# Patient Record
Sex: Female | Born: 1935 | Race: Black or African American | Hispanic: No | Smoking: Never smoker
Health system: Southern US, Community
[De-identification: ages and names within clinical notes are randomized; demographics above are authoritative.]

## PROBLEM LIST (undated history)

## (undated) DIAGNOSIS — E119 Type 2 diabetes mellitus without complications: Secondary | ICD-10-CM

## (undated) DIAGNOSIS — Z972 Presence of dental prosthetic device (complete) (partial): Secondary | ICD-10-CM

## (undated) DIAGNOSIS — Z9289 Personal history of other medical treatment: Secondary | ICD-10-CM

## (undated) DIAGNOSIS — E785 Hyperlipidemia, unspecified: Secondary | ICD-10-CM

## (undated) DIAGNOSIS — I10 Essential (primary) hypertension: Secondary | ICD-10-CM

## (undated) DIAGNOSIS — Z9989 Dependence on other enabling machines and devices: Secondary | ICD-10-CM

## (undated) DIAGNOSIS — M1990 Unspecified osteoarthritis, unspecified site: Secondary | ICD-10-CM

## (undated) DIAGNOSIS — C55 Malignant neoplasm of uterus, part unspecified: Secondary | ICD-10-CM

## (undated) DIAGNOSIS — I2510 Atherosclerotic heart disease of native coronary artery without angina pectoris: Secondary | ICD-10-CM

## (undated) DIAGNOSIS — R0600 Dyspnea, unspecified: Secondary | ICD-10-CM

## (undated) DIAGNOSIS — D649 Anemia, unspecified: Secondary | ICD-10-CM

## (undated) HISTORY — PX: EYE SURGERY: SHX253

## (undated) HISTORY — DX: Malignant neoplasm of uterus, part unspecified: C55

## (undated) HISTORY — PX: COLONOSCOPY: SHX174

## (undated) HISTORY — PX: MULTIPLE TOOTH EXTRACTIONS: SHX2053

---

## 2008-03-01 ENCOUNTER — Ambulatory Visit: Payer: Self-pay | Admitting: Internal Medicine

## 2008-03-20 ENCOUNTER — Ambulatory Visit: Payer: Self-pay | Admitting: Internal Medicine

## 2008-06-07 ENCOUNTER — Encounter: Admission: RE | Admit: 2008-06-07 | Discharge: 2008-06-07 | Payer: Self-pay | Admitting: Internal Medicine

## 2009-06-08 ENCOUNTER — Encounter: Admission: RE | Admit: 2009-06-08 | Discharge: 2009-06-08 | Payer: Self-pay | Admitting: Internal Medicine

## 2010-06-10 ENCOUNTER — Encounter: Admission: RE | Admit: 2010-06-10 | Discharge: 2010-06-10 | Payer: Self-pay | Admitting: Internal Medicine

## 2011-04-27 ENCOUNTER — Inpatient Hospital Stay (INDEPENDENT_AMBULATORY_CARE_PROVIDER_SITE_OTHER)
Admission: RE | Admit: 2011-04-27 | Discharge: 2011-04-27 | Disposition: A | Discharge status: Home / Self Care | Payer: PRIVATE HEALTH INSURANCE | Source: Ambulatory Visit | Attending: Emergency Medicine | Admitting: Emergency Medicine

## 2011-04-27 ENCOUNTER — Ambulatory Visit (INDEPENDENT_AMBULATORY_CARE_PROVIDER_SITE_OTHER): Payer: PRIVATE HEALTH INSURANCE

## 2011-04-27 DIAGNOSIS — M19029 Primary osteoarthritis, unspecified elbow: Secondary | ICD-10-CM

## 2011-04-27 DIAGNOSIS — R070 Pain in throat: Secondary | ICD-10-CM

## 2011-04-27 DIAGNOSIS — J3489 Other specified disorders of nose and nasal sinuses: Secondary | ICD-10-CM

## 2011-07-14 ENCOUNTER — Other Ambulatory Visit: Payer: Self-pay | Admitting: Internal Medicine

## 2011-07-14 DIAGNOSIS — Z1231 Encounter for screening mammogram for malignant neoplasm of breast: Secondary | ICD-10-CM

## 2011-08-05 ENCOUNTER — Ambulatory Visit
Admission: RE | Admit: 2011-08-05 | Discharge: 2011-08-05 | Disposition: A | Discharge status: Home / Self Care | Payer: PRIVATE HEALTH INSURANCE | Source: Ambulatory Visit | Attending: Internal Medicine | Admitting: Internal Medicine

## 2011-08-05 DIAGNOSIS — Z1231 Encounter for screening mammogram for malignant neoplasm of breast: Secondary | ICD-10-CM

## 2012-07-06 ENCOUNTER — Other Ambulatory Visit: Payer: Self-pay | Admitting: Internal Medicine

## 2012-07-06 DIAGNOSIS — Z1231 Encounter for screening mammogram for malignant neoplasm of breast: Secondary | ICD-10-CM

## 2012-08-05 ENCOUNTER — Ambulatory Visit
Admission: RE | Admit: 2012-08-05 | Discharge: 2012-08-05 | Disposition: A | Discharge status: Home / Self Care | Payer: PRIVATE HEALTH INSURANCE | Source: Ambulatory Visit | Attending: Internal Medicine | Admitting: Internal Medicine

## 2012-08-05 DIAGNOSIS — Z1231 Encounter for screening mammogram for malignant neoplasm of breast: Secondary | ICD-10-CM

## 2012-10-11 ENCOUNTER — Other Ambulatory Visit: Payer: Self-pay | Admitting: Internal Medicine

## 2012-10-11 DIAGNOSIS — Z780 Asymptomatic menopausal state: Secondary | ICD-10-CM

## 2013-06-29 ENCOUNTER — Other Ambulatory Visit: Payer: Self-pay

## 2013-06-29 DIAGNOSIS — Z1231 Encounter for screening mammogram for malignant neoplasm of breast: Secondary | ICD-10-CM

## 2013-08-08 ENCOUNTER — Ambulatory Visit
Admission: RE | Admit: 2013-08-08 | Discharge: 2013-08-08 | Disposition: A | Discharge status: Home / Self Care | Payer: Medicare Other | Source: Ambulatory Visit

## 2013-08-08 DIAGNOSIS — Z1231 Encounter for screening mammogram for malignant neoplasm of breast: Secondary | ICD-10-CM

## 2013-08-30 ENCOUNTER — Ambulatory Visit
Admission: RE | Admit: 2013-08-30 | Discharge: 2013-08-30 | Disposition: A | Discharge status: Home / Self Care | Payer: Medicare Other | Source: Ambulatory Visit | Attending: Internal Medicine | Admitting: Internal Medicine

## 2013-08-30 DIAGNOSIS — Z780 Asymptomatic menopausal state: Secondary | ICD-10-CM

## 2014-07-05 ENCOUNTER — Other Ambulatory Visit: Payer: Self-pay

## 2014-07-05 DIAGNOSIS — Z1231 Encounter for screening mammogram for malignant neoplasm of breast: Secondary | ICD-10-CM

## 2014-08-09 ENCOUNTER — Ambulatory Visit
Admission: RE | Admit: 2014-08-09 | Discharge: 2014-08-09 | Disposition: A | Discharge status: Home / Self Care | Payer: Commercial Managed Care - HMO | Source: Ambulatory Visit

## 2014-08-09 DIAGNOSIS — Z1231 Encounter for screening mammogram for malignant neoplasm of breast: Secondary | ICD-10-CM

## 2015-07-11 ENCOUNTER — Other Ambulatory Visit: Payer: Self-pay

## 2015-07-11 DIAGNOSIS — Z1231 Encounter for screening mammogram for malignant neoplasm of breast: Secondary | ICD-10-CM

## 2015-08-13 ENCOUNTER — Ambulatory Visit
Admission: RE | Admit: 2015-08-13 | Discharge: 2015-08-13 | Disposition: A | Discharge status: Home / Self Care | Payer: Commercial Managed Care - HMO | Source: Ambulatory Visit

## 2015-08-13 DIAGNOSIS — Z1231 Encounter for screening mammogram for malignant neoplasm of breast: Secondary | ICD-10-CM

## 2015-11-27 ENCOUNTER — Other Ambulatory Visit: Payer: Self-pay | Admitting: Internal Medicine

## 2015-11-27 DIAGNOSIS — E2839 Other primary ovarian failure: Secondary | ICD-10-CM

## 2015-12-12 ENCOUNTER — Encounter: Payer: Self-pay | Admitting: Internal Medicine

## 2015-12-19 ENCOUNTER — Ambulatory Visit
Admission: RE | Admit: 2015-12-19 | Discharge: 2015-12-19 | Disposition: A | Discharge status: Home / Self Care | Payer: Commercial Managed Care - HMO | Source: Ambulatory Visit | Attending: Internal Medicine | Admitting: Internal Medicine

## 2015-12-19 DIAGNOSIS — E2839 Other primary ovarian failure: Secondary | ICD-10-CM

## 2016-07-10 ENCOUNTER — Other Ambulatory Visit: Payer: Self-pay | Admitting: Internal Medicine

## 2016-07-10 DIAGNOSIS — Z1231 Encounter for screening mammogram for malignant neoplasm of breast: Secondary | ICD-10-CM

## 2016-08-13 ENCOUNTER — Ambulatory Visit: Payer: Commercial Managed Care - HMO

## 2016-08-25 ENCOUNTER — Ambulatory Visit
Admission: RE | Admit: 2016-08-25 | Discharge: 2016-08-25 | Disposition: A | Discharge status: Home / Self Care | Payer: Medicare HMO | Source: Ambulatory Visit | Attending: Internal Medicine | Admitting: Internal Medicine

## 2016-08-25 DIAGNOSIS — Z1231 Encounter for screening mammogram for malignant neoplasm of breast: Secondary | ICD-10-CM

## 2018-01-11 ENCOUNTER — Encounter (HOSPITAL_COMMUNITY): Payer: Self-pay | Admitting: *Deleted

## 2018-01-11 ENCOUNTER — Other Ambulatory Visit: Payer: Self-pay

## 2018-01-11 ENCOUNTER — Observation Stay (HOSPITAL_COMMUNITY)
Admission: EM | Admit: 2018-01-11 | Discharge: 2018-01-13 | Disposition: A | Discharge status: Home / Self Care | Payer: Medicare HMO | Attending: Internal Medicine | Admitting: Internal Medicine

## 2018-01-11 ENCOUNTER — Emergency Department (HOSPITAL_COMMUNITY): Payer: Medicare HMO

## 2018-01-11 DIAGNOSIS — I509 Heart failure, unspecified: Secondary | ICD-10-CM | POA: Insufficient documentation

## 2018-01-11 DIAGNOSIS — E785 Hyperlipidemia, unspecified: Secondary | ICD-10-CM | POA: Insufficient documentation

## 2018-01-11 DIAGNOSIS — I110 Hypertensive heart disease with heart failure: Secondary | ICD-10-CM | POA: Diagnosis not present

## 2018-01-11 DIAGNOSIS — Z79899 Other long term (current) drug therapy: Secondary | ICD-10-CM | POA: Diagnosis not present

## 2018-01-11 DIAGNOSIS — I2510 Atherosclerotic heart disease of native coronary artery without angina pectoris: Secondary | ICD-10-CM | POA: Insufficient documentation

## 2018-01-11 DIAGNOSIS — D649 Anemia, unspecified: Secondary | ICD-10-CM | POA: Diagnosis present

## 2018-01-11 DIAGNOSIS — N9489 Other specified conditions associated with female genital organs and menstrual cycle: Secondary | ICD-10-CM

## 2018-01-11 DIAGNOSIS — R3129 Other microscopic hematuria: Secondary | ICD-10-CM | POA: Diagnosis not present

## 2018-01-11 DIAGNOSIS — D61818 Other pancytopenia: Secondary | ICD-10-CM | POA: Diagnosis present

## 2018-01-11 DIAGNOSIS — E119 Type 2 diabetes mellitus without complications: Secondary | ICD-10-CM | POA: Insufficient documentation

## 2018-01-11 DIAGNOSIS — D500 Iron deficiency anemia secondary to blood loss (chronic): Secondary | ICD-10-CM | POA: Diagnosis not present

## 2018-01-11 DIAGNOSIS — D4959 Neoplasm of unspecified behavior of other genitourinary organ: Secondary | ICD-10-CM | POA: Diagnosis not present

## 2018-01-11 DIAGNOSIS — I10 Essential (primary) hypertension: Secondary | ICD-10-CM | POA: Diagnosis present

## 2018-01-11 DIAGNOSIS — N3289 Other specified disorders of bladder: Secondary | ICD-10-CM

## 2018-01-11 DIAGNOSIS — N329 Bladder disorder, unspecified: Secondary | ICD-10-CM | POA: Diagnosis not present

## 2018-01-11 DIAGNOSIS — Z880 Allergy status to penicillin: Secondary | ICD-10-CM | POA: Insufficient documentation

## 2018-01-11 DIAGNOSIS — R600 Localized edema: Secondary | ICD-10-CM | POA: Insufficient documentation

## 2018-01-11 DIAGNOSIS — R319 Hematuria, unspecified: Secondary | ICD-10-CM

## 2018-01-11 HISTORY — DX: Hyperlipidemia, unspecified: E78.5

## 2018-01-11 HISTORY — DX: Atherosclerotic heart disease of native coronary artery without angina pectoris: I25.10

## 2018-01-11 HISTORY — DX: Essential (primary) hypertension: I10

## 2018-01-11 HISTORY — DX: Type 2 diabetes mellitus without complications: E11.9

## 2018-01-11 LAB — URINALYSIS, ROUTINE W REFLEX MICROSCOPIC
Bilirubin Urine: NEGATIVE
Glucose, UA: NEGATIVE mg/dL
Ketones, ur: NEGATIVE mg/dL
Leukocytes, UA: NEGATIVE
Nitrite: NEGATIVE
Protein, ur: NEGATIVE mg/dL
RBC / HPF: >50 RBC/hpf — ABNORMAL HIGH (ref 0–5)
Specific Gravity, Urine: 1.005 (ref 1.005–1.030)
pH: 7 (ref 5.0–8.0)

## 2018-01-11 LAB — IRON AND TIBC
Iron: 143 ug/dL (ref 28–170)
Saturation Ratios: 29 % (ref 10.4–31.8)
TIBC: 491 ug/dL — ABNORMAL HIGH (ref 250–450)
UIBC: 348 ug/dL

## 2018-01-11 LAB — CBC WITH DIFFERENTIAL/PLATELET
Abs Immature Granulocytes: 0 10*3/uL (ref 0.0–0.1)
Basophils Absolute: 0 10*3/uL (ref 0.0–0.1)
Basophils Relative: 0 %
Eosinophils Absolute: 0.1 10*3/uL (ref 0.0–0.7)
Eosinophils Relative: 1 %
HCT: 24.7 % — ABNORMAL LOW (ref 36.0–46.0)
Hemoglobin: 6.6 g/dL — CL (ref 12.0–15.0)
Immature Granulocytes: 0 %
Lymphocytes Relative: 28 %
Lymphs Abs: 1.3 10*3/uL (ref 0.7–4.0)
MCH: 18.8 pg — ABNORMAL LOW (ref 26.0–34.0)
MCHC: 26.7 g/dL — ABNORMAL LOW (ref 30.0–36.0)
MCV: 70.2 fL — ABNORMAL LOW (ref 78.0–100.0)
Monocytes Absolute: 0.5 10*3/uL (ref 0.1–1.0)
Monocytes Relative: 10 %
Neutro Abs: 2.8 10*3/uL (ref 1.7–7.7)
Neutrophils Relative %: 61 %
Platelets: 279 10*3/uL (ref 150–400)
RBC: 3.52 MIL/uL — ABNORMAL LOW (ref 3.87–5.11)
RDW: 19 % — ABNORMAL HIGH (ref 11.5–15.5)
WBC: 4.6 10*3/uL (ref 4.0–10.5)

## 2018-01-11 LAB — COMPREHENSIVE METABOLIC PANEL
ALT: 11 U/L — ABNORMAL LOW (ref 14–54)
AST: 21 U/L (ref 15–41)
Albumin: 4.2 g/dL (ref 3.5–5.0)
Alkaline Phosphatase: 69 U/L (ref 38–126)
Anion gap: 9 (ref 5–15)
BUN: 13 mg/dL (ref 6–20)
CO2: 28 mmol/L (ref 22–32)
Calcium: 9.7 mg/dL (ref 8.9–10.3)
Chloride: 101 mmol/L (ref 101–111)
Creatinine, Ser: 0.9 mg/dL (ref 0.44–1.00)
GFR calc Af Amer: >60 mL/min (ref >60)
GFR calc non Af Amer: 58 mL/min — ABNORMAL LOW (ref >60)
Glucose, Bld: 112 mg/dL — ABNORMAL HIGH (ref 65–99)
Potassium: 4.4 mmol/L (ref 3.5–5.1)
Sodium: 138 mmol/L (ref 135–145)
Total Bilirubin: 0.4 mg/dL (ref 0.3–1.2)
Total Protein: 7 g/dL (ref 6.5–8.1)

## 2018-01-11 LAB — RETICULOCYTES
RBC.: 3.64 MIL/uL — ABNORMAL LOW (ref 3.87–5.11)
Retic Count, Absolute: 80.1 10*3/uL (ref 19.0–186.0)
Retic Ct Pct: 2.2 % (ref 0.4–3.1)

## 2018-01-11 LAB — PREPARE RBC (CROSSMATCH)

## 2018-01-11 LAB — FERRITIN: Ferritin: 11 ng/mL (ref 11–307)

## 2018-01-11 LAB — VITAMIN B12: Vitamin B-12: 620 pg/mL (ref 180–914)

## 2018-01-11 LAB — PROTIME-INR
INR: 0.98
Prothrombin Time: 12.9 seconds (ref 11.4–15.2)

## 2018-01-11 LAB — POC OCCULT BLOOD, ED: Fecal Occult Bld: NEGATIVE

## 2018-01-11 LAB — ABO/RH: ABO/RH(D): O POS

## 2018-01-11 MED ORDER — SODIUM CHLORIDE 0.9% FLUSH
3.0000 mL | Freq: Two times a day (BID) | INTRAVENOUS | Status: DC
  Administered 2018-01-11 – 2018-01-13 (×4): 3 mL via INTRAVENOUS

## 2018-01-11 MED ORDER — CYCLOSPORINE 0.05 % OP EMUL
1.0000 [drp] | Freq: Two times a day (BID) | OPHTHALMIC | Status: DC
  Administered 2018-01-12 – 2018-01-13 (×4): 1 [drp] via OPHTHALMIC
  Filled 2018-01-11 (×5): qty 1

## 2018-01-11 MED ORDER — ENALAPRIL MALEATE 20 MG PO TABS
20.0000 mg | ORAL_TABLET | Freq: Two times a day (BID) | ORAL | Status: DC
  Administered 2018-01-12 – 2018-01-13 (×4): 20 mg via ORAL
  Filled 2018-01-11 (×6): qty 1

## 2018-01-11 MED ORDER — ISOSORBIDE MONONITRATE 20 MG PO TABS
20.0000 mg | ORAL_TABLET | Freq: Every day | ORAL | Status: DC
  Administered 2018-01-12 – 2018-01-13 (×2): 20 mg via ORAL
  Filled 2018-01-11 (×2): qty 1

## 2018-01-11 MED ORDER — ACETAMINOPHEN 650 MG RE SUPP: 650.0000 mg | Freq: Four times a day (QID) | RECTAL | Status: DC | PRN

## 2018-01-11 MED ORDER — HYDRALAZINE HCL 20 MG/ML IJ SOLN
10.0000 mg | INTRAMUSCULAR | Status: DC | PRN
  Administered 2018-01-11: 10 mg via INTRAVENOUS
  Filled 2018-01-11: qty 1

## 2018-01-11 MED ORDER — ACETAMINOPHEN 325 MG PO TABS: 650.0000 mg | ORAL_TABLET | Freq: Four times a day (QID) | ORAL | Status: DC | PRN

## 2018-01-11 MED ORDER — ONDANSETRON HCL 4 MG/2ML IJ SOLN: 4.0000 mg | Freq: Four times a day (QID) | INTRAMUSCULAR | Status: DC | PRN

## 2018-01-11 MED ORDER — AMLODIPINE BESYLATE 5 MG PO TABS
5.0000 mg | ORAL_TABLET | Freq: Every day | ORAL | Status: DC
  Administered 2018-01-12 – 2018-01-13 (×2): 5 mg via ORAL
  Filled 2018-01-11 (×2): qty 1

## 2018-01-11 MED ORDER — SODIUM CHLORIDE 0.9% FLUSH: 3.0000 mL | INTRAVENOUS | Status: DC | PRN

## 2018-01-11 MED ORDER — CLONIDINE HCL 0.2 MG PO TABS
0.2000 mg | ORAL_TABLET | Freq: Two times a day (BID) | ORAL | Status: DC
  Administered 2018-01-11 – 2018-01-13 (×4): 0.2 mg via ORAL
  Filled 2018-01-11 (×4): qty 1

## 2018-01-11 MED ORDER — HYDROCODONE-ACETAMINOPHEN 5-325 MG PO TABS: 1.0000 | ORAL_TABLET | ORAL | Status: DC | PRN

## 2018-01-11 MED ORDER — ONDANSETRON HCL 4 MG PO TABS: 4.0000 mg | ORAL_TABLET | Freq: Four times a day (QID) | ORAL | Status: DC | PRN

## 2018-01-11 MED ORDER — SODIUM CHLORIDE 0.9 % IV SOLN: 250.0000 mL | INTRAVENOUS | Status: DC | PRN

## 2018-01-11 MED ORDER — METOPROLOL SUCCINATE ER 50 MG PO TB24
50.0000 mg | ORAL_TABLET | Freq: Every day | ORAL | Status: DC
  Administered 2018-01-12 – 2018-01-13 (×2): 50 mg via ORAL
  Filled 2018-01-11 (×2): qty 1

## 2018-01-11 MED ORDER — SIMVASTATIN 20 MG PO TABS
20.0000 mg | ORAL_TABLET | Freq: Every day | ORAL | Status: DC
  Filled 2018-01-11: qty 1

## 2018-01-11 MED ORDER — SODIUM CHLORIDE 0.9 % IV SOLN
Freq: Once | INTRAVENOUS | Status: AC
  Administered 2018-01-13: 12:00:00 via INTRAVENOUS

## 2018-01-11 MED ORDER — SIMVASTATIN 20 MG PO TABS
20.0000 mg | ORAL_TABLET | Freq: Every day | ORAL | Status: DC
  Administered 2018-01-11 – 2018-01-13 (×3): 20 mg via ORAL
  Filled 2018-01-11 (×2): qty 1

## 2018-01-11 MED ORDER — FUROSEMIDE 40 MG PO TABS
40.0000 mg | ORAL_TABLET | Freq: Every day | ORAL | Status: DC
  Administered 2018-01-13: 40 mg via ORAL
  Filled 2018-01-11 (×2): qty 1

## 2018-01-11 NOTE — ED Provider Notes (Signed)
Patient placed in Quick Look pathway, seen and evaluated   Chief Complaint: shortness of breah  HPI:   Pt sent here for low hemoglobin.   ROS: weak and short of breath  Physical Exam:   Gen: No distress  Neuro: Awake and Alert  Skin: Warm    Focused Exam: pale, Lungs clear heart rrr   Initiation of care has begun. The patient has been counseled on the process, plan, and necessity for staying for the completion/evaluation, and the remainder of the medical screening examination   Sidney Ace 01/11/18 1806    Valarie Merino, MD 01/11/18 2351

## 2018-01-11 NOTE — ED Triage Notes (Signed)
Pt in with family stating she went to her PCP last week and had lab work completed- went in due to increased shortness of breath over the last few months- pt called today and told her hemoglobin was low, no distress on arrival

## 2018-01-11 NOTE — ED Provider Notes (Signed)
[facility] EMERGENCY DEPARTMENT Provider Note   CSN: [id number] Arrival date & time: 01/11/18  1709     History   Chief Complaint Chief Complaint  Patient presents with  . Abnormal Lab    HPI Brenda Alexander is a 84 y.o. female.  84 year old female with prior history of diabetes, hypertension, and hyperlipidemia presents for evaluation of anemia.  Patient reports gradually worsening weakness and dyspnea with exertion.  The symptoms have been progressing over the last 1 to 2 weeks.  She was seen by her primary doctor today and diagnosed with anemia and then sent to the ED for further evaluation.  Patient denies associated chest pain.  Patient denies a prior history of anemia or blood transfusion.  Patient denies any recent obvious lower GI bleed.  The history is provided by the patient and a relative.  Abnormal Lab  Time since result:  Anemia Patient referred by:  PCP Resulting agency:  External Resulting agency details:  Low hgb    Past Medical History:  Diagnosis Date  . Coronary artery disease   . Diabetes mellitus without complication ([place])   . Hyperlipemia   . Hypertension     Patient Active Problem List   Diagnosis Date Noted  . Symptomatic anemia 01/11/2018  . Hypertension 01/11/2018  . Coronary artery disease 01/11/2018  . Bilateral lower extremity edema 01/11/2018    History reviewed. No pertinent surgical history.   OB History   None      Home Medications    Prior to Admission medications   Medication Sig Start Date End Date Taking? Authorizing Provider  acetaminophen (TYLENOL) 500 MG tablet Take 500-1,000 mg by mouth every 6 (six) hours as needed (for pain).   Yes [provider]  amLODipine (NORVASC) 5 MG tablet Take 5 mg by mouth daily.   Yes [provider]  Calcium Carb-Cholecalciferol (CALCIUM + D3 PO) Take 1 tablet by mouth daily.   Yes [provider]  cloNIDine (CATAPRES) 0.2 MG tablet Take 0.2 mg  by mouth 2 (two) times daily.   Yes [provider]  cycloSPORINE (RESTASIS) 0.05 % ophthalmic emulsion Place 1 drop into both eyes 2 (two) times daily.   Yes [provider]  enalapril (VASOTEC) 20 MG tablet Take 20 mg by mouth 2 (two) times daily. 12/23/17  Yes [provider]  furosemide (LASIX) 40 MG tablet Take 40 mg by mouth daily.   Yes [provider]  isosorbide mononitrate (ISMO,MONOKET) 20 MG tablet Take 20 mg by mouth daily. 12/23/17  Yes [provider]  metoprolol succinate (TOPROL-XL) 50 MG 24 hr tablet Take 50 mg by mouth daily. 12/23/17  Yes [provider]  nitrofurantoin, macrocrystal-monohydrate, (MACROBID) 100 MG capsule Take 100 mg by mouth 2 (two) times daily. FOR 7 DAYS 01/07/18 01/13/18 Yes [provider]  potassium chloride SA (K-DUR,KLOR-CON) 20 MEQ tablet Take 20 mEq by mouth daily.   Yes [provider]  simvastatin (ZOCOR) 20 MG tablet Take 20 mg by mouth at bedtime.    Yes [provider]    Family History History reviewed. No pertinent family history.  Social History Social History   Tobacco Use  . Smoking status: Never Smoker  . Smokeless tobacco: Never Used  Substance Use Topics  . Alcohol use: Not on file  . Drug use: Not on file     Allergies   Other and Penicillins   Review of Systems Review of Systems  Constitutional: Positive for  fatigue.  All other systems reviewed and are negative.    Physical Exam Updated Vital Signs BP (!) 185/86   Pulse 71   Temp 98.2 F (36.8 C) (Oral)   Resp 13   SpO2 99%   Physical Exam  Constitutional: She is oriented to person, place, and time. She appears well-developed and well-nourished. No distress.  HENT:  Head: Normocephalic and atraumatic.  Mouth/Throat: Oropharynx is clear and moist.  Eyes: Pupils are equal, round, and reactive to light. Conjunctivae and EOM are normal.  Neck: Normal range of motion. Neck supple.    Cardiovascular: Normal rate, regular rhythm and normal heart sounds.  Pulmonary/Chest: Effort normal and breath sounds normal. No respiratory distress.  Abdominal: Soft. She exhibits no distension. There is no tenderness.  Musculoskeletal: Normal range of motion. She exhibits no edema or deformity.  Neurological: She is alert and oriented to person, place, and time.  Skin: Skin is warm and dry.  Psychiatric: She has a normal mood and affect.  Nursing note and vitals reviewed.    ED Treatments / Results  Labs (all labs ordered are listed, but only abnormal results are displayed) Labs Reviewed  CBC WITH DIFFERENTIAL/PLATELET - Abnormal; Notable for the following components:      Result Value   RBC 3.52 (*)    Hemoglobin 6.6 (*)    HCT 24.7 (*)    MCV 70.2 (*)    MCH 18.8 (*)    MCHC 26.7 (*)    RDW 19.0 (*)    All other components within normal limits  COMPREHENSIVE METABOLIC PANEL - Abnormal; Notable for the following components:   Glucose, Bld 112 (*)    ALT 11 (*)    GFR calc non Af Amer 58 (*)    All other components within normal limits  URINALYSIS, ROUTINE W REFLEX MICROSCOPIC - Abnormal; Notable for the following components:   Hgb urine dipstick LARGE (*)    RBC / HPF >50 (*)    Bacteria, UA RARE (*)    All other components within normal limits  VITAMIN B12  FOLATE  IRON AND TIBC  FERRITIN  RETICULOCYTES  PROTIME-INR  BASIC METABOLIC PANEL  POC OCCULT BLOOD, ED  TYPE AND SCREEN  ABO/RH  PREPARE RBC (CROSSMATCH)    EKG EKG Interpretation  Date/Time:  Monday January 11 2018 19:32:51 EDT Ventricular Rate:  68 PR Interval:    QRS Duration: 86 QT Interval:  433 QTC Calculation: 461 R Axis:   7 Text Interpretation:  Sinus rhythm Abnormal R-wave progression, early transition Confirmed by Dene Gentry [phone]) on 01/11/2018 7:35:31 PM   Radiology Dg Chest Port 1 View  Result Date: 01/11/2018 CLINICAL DATA:  84 year old female with increasing shortness of  breath over the last few months. Weakness. Anemic. EXAM: PORTABLE CHEST 1 VIEW COMPARISON:  None. FINDINGS: Portable AP semi upright view at 1928 hours. Mild cardiomegaly. Other mediastinal contours are within normal limits. Visualized tracheal air column is within normal limits. Allowing for portable technique the lungs are clear. Negative visible bowel gas pattern. No acute osseous abnormality identified. IMPRESSION: Mild cardiomegaly.  No acute cardiopulmonary abnormality. Electronically Signed   By: Genevie Ann M.D.   On: 01/11/2018 19:43    Procedures Procedures (including critical care time)  Medications Ordered in ED Medications - No data to display   Initial Impression / Assessment and Plan / ED Course  I have reviewed the triage vital signs and the nursing notes.  Pertinent labs & imaging results that  were available during my care of the patient were reviewed by me and considered in my medical decision making (see chart for details).     MDM  Screen complete  Patient is presenting for evaluation of reported anemia.  Patient appears to be symptomatic based on complaint.  Initial labs show a hemoglobin of 6.6.  No evidence of active GI bleed at this time.  Other screening labs do not suggest other acute pathology.  Will be admitted to the hospitalist service for further work-up and treatment. Opyd aware of case.    Final Clinical Impressions(s) / ED Diagnoses   Final diagnoses:  Symptomatic anemia    ED Discharge Orders    None       Valarie Merino, MD 01/11/18 2144

## 2018-01-11 NOTE — ED Notes (Signed)
Attempted report.  Left number 

## 2018-01-11 NOTE — H&P (Signed)
History and Physical    Nalani Andreen OAC:[id number] DOB: 07/04/1933 DOA: 01/11/2018  PCP: Nolene Ebbs, MD   Patient coming from: Home  Chief Complaint: DOE, low Hgb with PCP  HPI: Legend Pecore is a 84 y.o. female with medical history significant for hypertension and coronary artery disease, now presenting to the emergency department at the direction of her PCP for evaluation of exertional dyspnea and low hemoglobin.  Patient reports that she had been in her usual state of health until noting the insidious development of progressive exertional dyspnea over the past few weeks.  She denies any chest pain, palpitations, cough, fevers, or chills associated with this.  She denies seeing blood in her stool, but reports occasional dark stool.  She denies epigastric or abdominal discomfort and denies any significant nausea.  She does not take a blood thinner and stopped her baby aspirin a couple weeks ago.  She was evaluated by her PCP for these complaints, blood work was obtained, hemoglobin was noted to be low, and she was directed to the ED for further evaluation.  ED Course: Upon arrival to the ED, patient is found to be afebrile, saturating well on room air, mildly hypertensive, and vitals otherwise normal.  EKG features a sinus rhythm with early R transition and chest x-ray is negative for acute cardiopulmonary disease.  Chemistry panel is unremarkable and CBC is notable for microcytic anemia with hemoglobin of 6.6 and MCV of 70.2.  Fecal occult blood testing is negative.  Urinalysis features microscopic hematuria.  Type and screen was performed in the ED.  Patient remains hemodynamically stable, in no apparent respiratory distress, and will be observed for ongoing evaluation and management of symptomatic anemia.  Review of Systems:  All other systems reviewed and apart from HPI, are negative.  Past Medical History:  Diagnosis Date  . Coronary artery disease   . Diabetes mellitus without  complication ([place])   . Hyperlipemia   . Hypertension     History reviewed. No pertinent surgical history.   reports that she has never smoked. She has never used smokeless tobacco. Her alcohol and drug histories are not on file.  Allergies  Allergen Reactions  . Other Anaphylaxis and Swelling    Walnuts = Throat swells  . Penicillins Rash    Has patient had a PCN reaction causing immediate rash, facial/tongue/throat swelling, SOB or lightheadedness with hypotension: Yes Has patient had a PCN reaction causing severe rash involving mucus membranes or skin necrosis: Unk Has patient had a PCN reaction that required hospitalization: Unk Has patient had a PCN reaction occurring within the last 10 years: No If all of the above answers are "NO", then may proceed with Cephalosporin use.     Family History  Problem Relation Age of Onset  . Obesity Son      Prior to Admission medications   Medication Sig Start Date End Date Taking? Authorizing Provider  acetaminophen (TYLENOL) 500 MG tablet Take 500-1,000 mg by mouth every 6 (six) hours as needed (for pain).   Yes [provider]  amLODipine (NORVASC) 5 MG tablet Take 5 mg by mouth daily.   Yes [provider]  Calcium Carb-Cholecalciferol (CALCIUM + D3 PO) Take 1 tablet by mouth daily.   Yes [provider]  cloNIDine (CATAPRES) 0.2 MG tablet Take 0.2 mg by mouth 2 (two) times daily.   Yes [provider]  cycloSPORINE (RESTASIS) 0.05 % ophthalmic emulsion Place 1 drop into both eyes 2 (two) times daily.  Yes [provider]  enalapril (VASOTEC) 20 MG tablet Take 20 mg by mouth 2 (two) times daily. 12/23/17  Yes [provider]  furosemide (LASIX) 40 MG tablet Take 40 mg by mouth daily.   Yes [provider]  isosorbide mononitrate (ISMO,MONOKET) 20 MG tablet Take 20 mg by mouth daily. 12/23/17  Yes [provider]  metoprolol succinate (TOPROL-XL) 50 MG 24 hr tablet  Take 50 mg by mouth daily. 12/23/17  Yes [provider]  nitrofurantoin, macrocrystal-monohydrate, (MACROBID) 100 MG capsule Take 100 mg by mouth 2 (two) times daily. FOR 7 DAYS 01/07/18 01/13/18 Yes [provider]  potassium chloride SA (K-DUR,KLOR-CON) 20 MEQ tablet Take 20 mEq by mouth daily.   Yes [provider]  simvastatin (ZOCOR) 20 MG tablet Take 20 mg by mouth at bedtime.    Yes [provider]    Physical Exam: Vitals:   01/11/18 2030 01/11/18 2100 01/11/18 2109 01/11/18 2130  BP: (!) 165/81 (!) 185/86 (!) 185/86 (!) 165/78  Pulse: 67 80 71 74  Resp: 18 (!) 25 13 13   Temp:      TempSrc:      SpO2: 100% 100% 99% 99%      Constitutional: NAD, calm  Eyes: PERTLA, lids and conjunctivae normal ENMT: Mucous membranes are moist. Posterior pharynx clear of any exudate or lesions.   Neck: normal, supple, no masses, no thyromegaly Respiratory: clear to auscultation bilaterally, no wheezing, no crackles. Normal respiratory effort.   Cardiovascular: S1 & S2 heard, regular rate and rhythm. Trace pretibial edema bilaterally. No significant JVD. Abdomen: No distension, no tenderness, soft. Bowel sounds active.  Musculoskeletal: no clubbing / cyanosis. No joint deformity upper and lower extremities.    Skin: no significant rashes, lesions, ulcers. Warm, dry, well-perfused. Psychiatric: Alert and oriented to person, place, and situation. Pleasant and cooperative.     Labs on Admission: I have personally reviewed following labs and imaging studies  CBC: Recent Labs  Lab 01/11/18 1801  WBC 4.6  NEUTROABS 2.8  HGB 6.6*  HCT 24.7*  MCV 70.2*  PLT 967   Basic Metabolic Panel: Recent Labs  Lab 01/11/18 1801  NA 138  K 4.4  CL 101  CO2 28  GLUCOSE 112*  BUN 13  CREATININE 0.90  CALCIUM 9.7   GFR: CrCl cannot be calculated (Unknown ideal weight.). Liver Function Tests: Recent Labs  Lab 01/11/18 1801  AST 21  ALT 11*  ALKPHOS 69    BILITOT 0.4  PROT 7.0  ALBUMIN 4.2   No results for input(s): LIPASE, AMYLASE in the last 168 hours. No results for input(s): AMMONIA in the last 168 hours. Coagulation Profile: No results for input(s): INR, PROTIME in the last 168 hours. Cardiac Enzymes: No results for input(s): CKTOTAL, CKMB, CKMBINDEX, TROPONINI in the last 168 hours. BNP (last 3 results) No results for input(s): PROBNP in the last 8760 hours. HbA1C: No results for input(s): HGBA1C in the last 72 hours. CBG: No results for input(s): GLUCAP in the last 168 hours. Lipid Profile: No results for input(s): CHOL, HDL, LDLCALC, TRIG, CHOLHDL, LDLDIRECT in the last 72 hours. Thyroid Function Tests: No results for input(s): TSH, T4TOTAL, FREET4, T3FREE, THYROIDAB in the last 72 hours. Anemia Panel: No results for input(s): VITAMINB12, FOLATE, FERRITIN, TIBC, IRON, RETICCTPCT in the last 72 hours. Urine analysis:    Component Value Date/Time   COLORURINE YELLOW 01/11/2018 2059   APPEARANCEUR CLEAR 01/11/2018 2059   LABSPEC 1.005 01/11/2018 2059  PHURINE 7.0 01/11/2018 2059   GLUCOSEU NEGATIVE 01/11/2018 2059   HGBUR LARGE (A) 01/11/2018 2059   BILIRUBINUR NEGATIVE 01/11/2018 2059   [place] NEGATIVE 01/11/2018 2059   PROTEINUR NEGATIVE 01/11/2018 2059   NITRITE NEGATIVE 01/11/2018 2059   LEUKOCYTESUR NEGATIVE 01/11/2018 2059   Sepsis Labs: @LABRCNTIP (procalcitonin:4,lacticidven:4) )No results found for this or any previous visit (from the past 240 hour(s)).   Radiological Exams on Admission: Dg Chest Port 1 View  Result Date: 01/11/2018 CLINICAL DATA:  84 year old female with increasing shortness of breath over the last few months. Weakness. Anemic. EXAM: PORTABLE CHEST 1 VIEW COMPARISON:  None. FINDINGS: Portable AP semi upright view at 1928 hours. Mild cardiomegaly. Other mediastinal contours are within normal limits. Visualized tracheal air column is within normal limits. Allowing for portable technique  the lungs are clear. Negative visible bowel gas pattern. No acute osseous abnormality identified. IMPRESSION: Mild cardiomegaly.  No acute cardiopulmonary abnormality. Electronically Signed   By: Genevie Ann M.D.   On: 01/11/2018 19:43    EKG: Independently reviewed. Sinus rhythm, poor R transition.   Assessment/Plan   1. Symptomatic anemia  - Presents with several weeks of DOE, found by PCP to be anemic  - Hgb is 6.6 in ED with no priors for comparison  - FOBT is negative and she is hemodynamically stable   - Check anemia panel, transfuse 1 unit and repeat CBC    2. Hypertension  - BP elevated in ED  - Continue Norvasc, clonidine, enalapril, Toprol, and Lasix   3. CAD - No anginal complaints  - Continue ACE, beta-blocker, nitrates, and statin     DVT prophylaxis: SCD's  Code Status: Full  Family Communication: Sons updated at bedside Consults called: None Admission status: Observation     Vianne Bulls, MD [facility] Pager [phone]  If 7PM-7AM, please contact night-coverage [email or web address] Password [facility]  01/11/2018, 9:52 PM

## 2018-01-12 ENCOUNTER — Other Ambulatory Visit: Payer: Self-pay

## 2018-01-12 ENCOUNTER — Encounter (HOSPITAL_COMMUNITY): Payer: Self-pay | Admitting: *Deleted

## 2018-01-12 ENCOUNTER — Observation Stay (HOSPITAL_COMMUNITY): Payer: Medicare HMO

## 2018-01-12 DIAGNOSIS — R319 Hematuria, unspecified: Secondary | ICD-10-CM

## 2018-01-12 DIAGNOSIS — R0600 Dyspnea, unspecified: Secondary | ICD-10-CM

## 2018-01-12 DIAGNOSIS — D649 Anemia, unspecified: Secondary | ICD-10-CM | POA: Diagnosis not present

## 2018-01-12 DIAGNOSIS — D500 Iron deficiency anemia secondary to blood loss (chronic): Secondary | ICD-10-CM | POA: Diagnosis not present

## 2018-01-12 DIAGNOSIS — Z9289 Personal history of other medical treatment: Secondary | ICD-10-CM

## 2018-01-12 DIAGNOSIS — I10 Essential (primary) hypertension: Secondary | ICD-10-CM | POA: Diagnosis not present

## 2018-01-12 HISTORY — DX: Dyspnea, unspecified: R06.00

## 2018-01-12 HISTORY — DX: Personal history of other medical treatment: Z92.89

## 2018-01-12 LAB — BASIC METABOLIC PANEL
Anion gap: 8 (ref 5–15)
BUN: 10 mg/dL (ref 6–20)
CO2: 27 mmol/L (ref 22–32)
Calcium: 9.4 mg/dL (ref 8.9–10.3)
Chloride: 102 mmol/L (ref 101–111)
Creatinine, Ser: 0.77 mg/dL (ref 0.44–1.00)
GFR calc Af Amer: >60 mL/min (ref >60)
GFR calc non Af Amer: >60 mL/min (ref >60)
Glucose, Bld: 113 mg/dL — ABNORMAL HIGH (ref 65–99)
Potassium: 3.8 mmol/L (ref 3.5–5.1)
Sodium: 137 mmol/L (ref 135–145)

## 2018-01-12 LAB — CBC
HCT: 26 % — ABNORMAL LOW (ref 36.0–46.0)
Hemoglobin: 7.4 g/dL — ABNORMAL LOW (ref 12.0–15.0)
MCH: 19.8 pg — ABNORMAL LOW (ref 26.0–34.0)
MCHC: 28.5 g/dL — ABNORMAL LOW (ref 30.0–36.0)
MCV: 69.7 fL — ABNORMAL LOW (ref 78.0–100.0)
Platelets: 253 10*3/uL (ref 150–400)
RBC: 3.73 MIL/uL — ABNORMAL LOW (ref 3.87–5.11)
RDW: 19.6 % — ABNORMAL HIGH (ref 11.5–15.5)
WBC: 5.2 10*3/uL (ref 4.0–10.5)

## 2018-01-12 LAB — HEMOGLOBIN AND HEMATOCRIT, BLOOD
HCT: 29 % — ABNORMAL LOW (ref 36.0–46.0)
Hemoglobin: 8.3 g/dL — ABNORMAL LOW (ref 12.0–15.0)

## 2018-01-12 LAB — PREPARE RBC (CROSSMATCH)

## 2018-01-12 LAB — FOLATE: Folate: 33 ng/mL (ref >5.9)

## 2018-01-12 MED ORDER — IOPAMIDOL (ISOVUE-370) INJECTION 76%
INTRAVENOUS | Status: AC
  Filled 2018-01-12: qty 100

## 2018-01-12 MED ORDER — IOHEXOL 300 MG/ML  SOLN
100.0000 mL | Freq: Once | INTRAMUSCULAR | Status: AC | PRN
  Administered 2018-01-12: 100 mL via INTRAVENOUS

## 2018-01-12 MED ORDER — SODIUM CHLORIDE 0.9% IV SOLUTION
Freq: Once | INTRAVENOUS | Status: AC
  Administered 2018-01-12: 12:00:00 via INTRAVENOUS

## 2018-01-12 MED ORDER — FERROUS GLUCONATE 324 (38 FE) MG PO TABS
324.0000 mg | ORAL_TABLET | Freq: Three times a day (TID) | ORAL | Status: DC
  Administered 2018-01-12 – 2018-01-13 (×4): 324 mg via ORAL
  Filled 2018-01-12 (×5): qty 1

## 2018-01-12 NOTE — Progress Notes (Signed)
PROGRESS NOTE  Sequoyah Ramone PNT:[id number] DOB: 24-Dec-1932 DOA: 01/11/2018 PCP: Nolene Ebbs, MD   LOS: 0 days   Brief Narrative / Interim history: 84 year old pleasant female with medical history of hypertension, coronary artery disease, who presented to the emergency room sent by her PCP because she was found to have low hemoglobin.  Patient denies any symptoms, however her daughter reports that over the last several weeks patient has been more tired and weak and more short of breath with activities.  Assessment & Plan: Principal Problem:   Symptomatic anemia Active Problems:   Hypertension   Coronary artery disease   Symptomatic microcytic anemia -Patient relatively asymptomatic, suggesting a chronic process.  She tells me that she saw her PCP back in February of this year and had blood work done at that time, and that showed that she was anemic.  She was started on iron supplements then, however she self discontinued but cannot tell me exactly why.  She thinks at one point she had black stools and that may be the reason.  She denies any bleeding, denies any bright red blood per rectum.  She is noticing however over the last week that (since last Thursday) her urine has been pink.   -Anemia panel shows low ferritin and high TIBC, suggesting iron deficiency. Will start iron supplements  Hematuria -She denies any dysuria, denies any burning with urination, she has no lower abdominal discomfort, has no frequency. Unlikely UTI but will send a culture.  Patient and her daughter deny urinary tract infections in the past.  -Discussed with urology Dr. Louis Meckel over the phone, he recommended to a CT of the abdomen and pelvis /  hematuria protocol. CT pending.   Hypertension -Blood pressure controlled, continue home Norvasc, furosemide, metoprolol, Imdur, enalapril  Hyperlipidemia -Continue statin  Coronary artery disease -No past records are available to me, no chest pain  though  CHF -Unknown type, patient and her daughter tell me that she was diagnosed with CHF but they are not sure about further details.  Currently appears euvolemic   DVT prophylaxis: SCDs Code Status: Full code Family Communication: Daughter present at bedside Disposition Plan: Home likely 1 day  Consultants:   Urology (phone)  Procedures:   None   Antimicrobials:  None    Subjective: - no chest pain, shortness of breath, no abdominal pain, nausea or vomiting.   Objective: Vitals:   01/12/18 0530 01/12/18 1156 01/12/18 1230 01/12/18 1248  BP: (!) 143/77 (!) 171/72 (!) 160/73 (!) 143/98  Pulse: 72 76 73 71  Resp: 18     Temp: 98.8 F (37.1 C) 98.1 F (36.7 C) 98.3 F (36.8 C) 98.3 F (36.8 C)  TempSrc: Oral Oral Oral Oral  SpO2: 100% 100% 100% 100%    Intake/Output Summary (Last 24 hours) at 01/12/2018 1507 Last data filed at 01/12/2018 0755 Gross per 24 hour  Intake 1269.5 ml  Output -  Net 1269.5 ml   There were no vitals filed for this visit.  Examination:  Constitutional: NAD Eyes: PERRL, lids and conjunctivae pale appearing ENMT: Mucous membranes are moist.  Neck: normal, supple Respiratory: clear to auscultation bilaterally, no wheezing, no crackles. Normal respiratory effort Cardiovascular: Regular rate and rhythm, no murmurs / rubs / gallops. No LE edema. Abdomen: no tenderness. Bowel sounds positive.  Skin: no rashes, lesions, ulcers. No induration Neurologic: CN 2-12 grossly intact. Strength 5/5 in all 4.  Psychiatric: Normal judgment and insight. Alert and oriented x 3. Normal mood.  Data Reviewed: I have independently reviewed following labs and imaging studies   CBC: Recent Labs  Lab 01/11/18 1801 01/12/18 0616  WBC 4.6 5.2  NEUTROABS 2.8  --   HGB 6.6* 7.4*  HCT 24.7* 26.0*  MCV 70.2* 69.7*  PLT 279 161   Basic Metabolic Panel: Recent Labs  Lab 01/11/18 1801 01/12/18 0616  NA 138 137  K 4.4 3.8  CL 101 102  CO2 28 27   GLUCOSE 112* 113*  BUN 13 10  CREATININE 0.90 0.77  CALCIUM 9.7 9.4   GFR: CrCl cannot be calculated (Unknown ideal weight.). Liver Function Tests: Recent Labs  Lab 01/11/18 1801  AST 21  ALT 11*  ALKPHOS 69  BILITOT 0.4  PROT 7.0  ALBUMIN 4.2   No results for input(s): LIPASE, AMYLASE in the last 168 hours. No results for input(s): AMMONIA in the last 168 hours. Coagulation Profile: Recent Labs  Lab 01/11/18 2154  INR 0.98   Cardiac Enzymes: No results for input(s): CKTOTAL, CKMB, CKMBINDEX, TROPONINI in the last 168 hours. BNP (last 3 results) No results for input(s): PROBNP in the last 8760 hours. HbA1C: No results for input(s): HGBA1C in the last 72 hours. CBG: No results for input(s): GLUCAP in the last 168 hours. Lipid Profile: No results for input(s): CHOL, HDL, LDLCALC, TRIG, CHOLHDL, LDLDIRECT in the last 72 hours. Thyroid Function Tests: No results for input(s): TSH, T4TOTAL, FREET4, T3FREE, THYROIDAB in the last 72 hours. Anemia Panel: Recent Labs    01/11/18 2154  VITAMINB12 620  FOLATE 33.0  FERRITIN 11  TIBC 491*  IRON 143  RETICCTPCT 2.2   Urine analysis:    Component Value Date/Time   COLORURINE YELLOW 01/11/2018 2059   APPEARANCEUR CLEAR 01/11/2018 2059   LABSPEC 1.005 01/11/2018 2059   PHURINE 7.0 01/11/2018 2059   GLUCOSEU NEGATIVE 01/11/2018 2059   HGBUR LARGE (A) 01/11/2018 2059   BILIRUBINUR NEGATIVE 01/11/2018 2059   KETONESUR NEGATIVE 01/11/2018 2059   PROTEINUR NEGATIVE 01/11/2018 2059   NITRITE NEGATIVE 01/11/2018 2059   LEUKOCYTESUR NEGATIVE 01/11/2018 2059   Sepsis Labs: Invalid input(s): PROCALCITONIN, LACTICIDVEN  No results found for this or any previous visit (from the past 240 hour(s)).    Radiology Studies: Dg Chest Port 1 View  Result Date: 01/11/2018 CLINICAL DATA:  84 year old female with increasing shortness of breath over the last few months. Weakness. Anemic. EXAM: PORTABLE CHEST 1 VIEW COMPARISON:   None. FINDINGS: Portable AP semi upright view at 1928 hours. Mild cardiomegaly. Other mediastinal contours are within normal limits. Visualized tracheal air column is within normal limits. Allowing for portable technique the lungs are clear. Negative visible bowel gas pattern. No acute osseous abnormality identified. IMPRESSION: Mild cardiomegaly.  No acute cardiopulmonary abnormality. Electronically Signed   By: Genevie Ann M.D.   On: 01/11/2018 19:43     Scheduled Meds: . amLODipine  5 mg Oral Daily  . cloNIDine  0.2 mg Oral BID  . cycloSPORINE  1 drop Both Eyes BID  . enalapril  20 mg Oral BID  . furosemide  40 mg Oral Daily  . isosorbide mononitrate  20 mg Oral Daily  . metoprolol succinate  50 mg Oral Daily  . simvastatin  20 mg Oral q1800  . sodium chloride flush  3 mL Intravenous Q12H   Continuous Infusions: . sodium chloride    . sodium chloride      Marzetta Board, MD, PhD [facility] Pager [phone]  If 7PM-7AM, please contact night-coverage  [email or web address] Password [facility] 01/12/2018, 3:07 PM

## 2018-01-13 DIAGNOSIS — N9489 Other specified conditions associated with female genital organs and menstrual cycle: Secondary | ICD-10-CM | POA: Diagnosis not present

## 2018-01-13 DIAGNOSIS — D649 Anemia, unspecified: Secondary | ICD-10-CM

## 2018-01-13 DIAGNOSIS — N3289 Other specified disorders of bladder: Secondary | ICD-10-CM | POA: Diagnosis not present

## 2018-01-13 DIAGNOSIS — D500 Iron deficiency anemia secondary to blood loss (chronic): Secondary | ICD-10-CM | POA: Diagnosis not present

## 2018-01-13 LAB — TYPE AND SCREEN
ABO/RH(D): O POS
Antibody Screen: NEGATIVE
Unit division: 0
Unit division: 0

## 2018-01-13 LAB — CBC
HCT: 28.7 % — ABNORMAL LOW (ref 36.0–46.0)
Hemoglobin: 8.2 g/dL — ABNORMAL LOW (ref 12.0–15.0)
MCH: 20.4 pg — ABNORMAL LOW (ref 26.0–34.0)
MCHC: 28.6 g/dL — ABNORMAL LOW (ref 30.0–36.0)
MCV: 71.6 fL — ABNORMAL LOW (ref 78.0–100.0)
Platelets: 240 10*3/uL (ref 150–400)
RBC: 4.01 MIL/uL (ref 3.87–5.11)
RDW: 20.4 % — ABNORMAL HIGH (ref 11.5–15.5)
WBC: 5.9 10*3/uL (ref 4.0–10.5)

## 2018-01-13 LAB — BPAM RBC
Blood Product Expiration Date: 201907142359
Blood Product Expiration Date: 201907152359
ISSUE DATE / TIME: 201906180008
ISSUE DATE / TIME: 201906181219
Unit Type and Rh: 5100
Unit Type and Rh: 5100

## 2018-01-13 MED ORDER — SODIUM CHLORIDE 0.9 % IV SOLN
510.0000 mg | Freq: Once | INTRAVENOUS | Status: AC
  Administered 2018-01-13: 510 mg via INTRAVENOUS
  Filled 2018-01-13: qty 17

## 2018-01-13 MED ORDER — FERROUS SULFATE 325 (65 FE) MG PO TBEC: 325.0000 mg | DELAYED_RELEASE_TABLET | Freq: Two times a day (BID) | ORAL | 3 refills | Status: DC

## 2018-01-13 NOTE — Progress Notes (Signed)
Patient's ultrasound was not completed. It was not guaranteed to be completed today per [place]. Dr. Ernestina Patches stated it would be ok to discharge patient and schedule [place] outpatient. Patient provided discharge instructions and RX. Family at bedside.

## 2018-01-13 NOTE — Discharge Summary (Signed)
Brenda Alexander, is a 84 y.o. female  DOB [birth year]  MRN [id number].  Admission date:  01/11/2018  Admitting Physician  Brenda Bulls, MD  Discharge Date:  01/13/2018   Primary MD  Brenda Ebbs, MD  Recommendations for primary care physician for things to follow:  -Please check CBC to ensure stable hemoglobin, BMP during next visit, daughter was instructed to follow in 2 days regarding repeat labs. -Patient will need to follow with urology as an outpatient regarding bladder tumor, have discussed with urology to arrange for follow-up appointment,. -We will need to follow with oncology GYN, we are arranging for an appointment.   Admission Diagnosis  Symptomatic anemia [D64.9]   Discharge Diagnosis  Symptomatic anemia [D64.9]    Principal Problem:   Symptomatic anemia Active Problems:   Hypertension   Coronary artery disease      Past Medical History:  Diagnosis Date  . Coronary artery disease   . Diabetes mellitus without complication ([place])   . Hyperlipemia   . Hypertension     History reviewed. No pertinent surgical history.     History of present illness and  Hospital Course:     Kindly see H&P for history of present illness and admission details, please review complete Labs, Consult reports and Test reports for all details in brief  HPI  from the history and physical done on the day of admission 01/11/2018   Brenda Alexander is a 84 y.o. female with medical history significant for hypertension and coronary artery disease, now presenting to the emergency department at the direction of her PCP for evaluation of exertional dyspnea and low hemoglobin.  Patient reports that she had been in her usual state of health until noting the insidious development of progressive exertional dyspnea over the past few weeks.  She denies any chest pain, palpitations, cough, fevers, or chills associated with this.   She denies seeing blood in her stool, but reports occasional dark stool.  She denies epigastric or abdominal discomfort and denies any significant nausea.  She does not take a blood thinner and stopped her baby aspirin a couple weeks ago.  She was evaluated by her PCP for these complaints, blood work was obtained, hemoglobin was noted to be low, and she was directed to the ED for further evaluation.  ED Course: Upon arrival to the ED, patient is found to be afebrile, saturating well on room air, mildly hypertensive, and vitals otherwise normal.  EKG features a sinus rhythm with early R transition and chest x-ray is negative for acute cardiopulmonary disease.  Chemistry panel is unremarkable and CBC is notable for microcytic anemia with hemoglobin of 6.6 and MCV of 70.2.  Fecal occult blood testing is negative.  Urinalysis features microscopic hematuria.  Type and screen was performed in the ED.  Patient remains hemodynamically stable, in no apparent respiratory distress, and will be observed for ongoing evaluation and management of symptomatic anemia.    Hospital Course    Symptomatic microcytic anemia/iron deficiency anemia -Patient relatively asymptomatic, suggesting  a chronic process.  She tells me that she saw her PCP back in February of this year and had blood work done at that time, and that showed that she was anemic.  She was started on iron supplements then, however she self discontinued ,  She thinks at one point she had black stools and that may be the reason.  She denies any bleeding, denies any bright red blood per rectum.  She had Hemoccult negative stool. -Work-up significant for iron deficiency anemia, she was transfused 2 units PRBC during hospital stay with good response, hemoglobin was 6.6 on admission, it is 8.2 on discharge, she did receive IV iron during hospital stay, as well he will be discharged on iron supplements. -Her iron deficiency anemia related to hematuria  Hematuria,  tumor and bladder -She denies any dysuria, denies any burning with urination, she has no lower abdominal discomfort, has no frequency.   To UTI . unlikely UTI but will send a culture.  Patient and her daughter deny urinary tract infections in the past.  -dr Gloris Ham  with urology Dr. Louis Meckel over the phone, he recommended to a CT of the abdomen and pelvis /  hematuria protocol.  It was significant for filling defect in her bladder suspicious for urothelial plasmin, most likely the cause of her hematuria, I have discussed with urology Dr. Alinda Money today she will need outpatient cystoscopy, I have discussed with the triage office, they will call her with an appointment within the next 24 to 48 hours  Endometrial tumor -CT abdomen pelvis significant for heterogeneous endometrial mass, I have discussed with Dr. Ernestina Patches from weight and service, she will arrange for outpatient GYN oncology follow-up, she already CEA, CA125 tumor markers, and transvaginal ultrasound was ordered  Hypertension -Blood pressure controlled, continue home Norvasc, furosemide, metoprolol, Imdur, enalapril  Hyperlipidemia -Continue statin  Coronary artery disease -No past records are available to me, no chest pain though  CHF /type  undetermined -Unknown type, patient and her daughter tell me that she was diagnosed with CHF but they are not sure about further details.  Currently appears euvolemic, resume on home dose Lasix   I have discussed with daughter at bedside, who is CNA at [facility], patient is eager to home today, I have discussed with the patient daughter who wants her to go home today, I have instructed her to schedule an appointment for her PCP this coming Friday in 2 days to repeat CBC to ensure stable hemoglobin, I have discussed with her the finding of endometrial and bladder lesions and high possibility of malignancy, and plan for follow-up as an outpatient,  Discharge Condition:   stable   Follow UP  Follow-up Information    [name], MD Follow up.   Specialty:  Gynecologic Oncology Why:  You will be called by this office to help schedule an appointment Contact information: [place] [address] [phone]        [facility] Follow up.   Specialty:  Obstetrics and Gynecology Why:  Call office if you have not heard from the Gynecologic/Oncology group in the next 2 weeks.  Contact information: [place] [phone]            Discharge Instructions  and  Discharge Medications     Discharge Instructions    Diet - low sodium heart healthy   Complete by:  As directed    Discharge instructions  Complete by:  As directed    Follow with Primary MD Brenda Ebbs, MD in 2 days   Get CBC, CMP,  checked  by Primary MD next visit.    Activity: As tolerated with Full fall precautions use walker/cane & assistance as needed   Disposition Home   Diet: Heart Healthy  , with feeding assistance and aspiration precautions.  For Heart failure patients - Check your Weight same time everyday, if you gain over 2 pounds, or you develop in leg swelling, experience more shortness of breath or chest pain, call your Primary MD immediately. Follow Cardiac Low Salt Diet and 1.5 lit/day fluid restriction.   On your next visit with your primary care physician please Get Medicines reviewed and adjusted.   Please request your Prim.MD to go over all Hospital Tests and Procedure/Radiological results at the follow up, please get all Hospital records sent to your Prim MD by signing hospital release before you go home.   If you experience worsening of your admission symptoms, develop shortness of breath, life threatening emergency, suicidal or homicidal thoughts you must seek medical attention immediately by calling 911 or calling your MD immediately  if symptoms less  severe.  You Must read complete instructions/literature along with all the possible adverse reactions/side effects for all the Medicines you take and that have been prescribed to you. Take any new Medicines after you have completely understood and accpet all the possible adverse reactions/side effects.   Do not drive, operating heavy machinery, perform activities at heights, swimming or participation in water activities or provide baby sitting services if your were admitted for syncope or siezures until you have seen by Primary MD or a Neurologist and advised to do so again.  Do not drive when taking Pain medications.    Do not take more than prescribed Pain, Sleep and Anxiety Medications  Special Instructions: If you have smoked or chewed Tobacco  in the last 2 yrs please stop smoking, stop any regular Alcohol  and or any Recreational drug use.  Wear Seat belts while driving.   Please note  You were cared for by a hospitalist during your hospital stay. If you have any questions about your discharge medications or the care you received while you were in the hospital after you are discharged, you can call the unit and asked to speak with the hospitalist on call if the hospitalist that took care of you is not available. Once you are discharged, your primary care physician will handle any further medical issues. Please note that NO REFILLS for any discharge medications will be authorized once you are discharged, as it is imperative that you return to your primary care physician (or establish a relationship with a primary care physician if you do not have one) for your aftercare needs so that they can reassess your need for medications and monitor your lab values.   Increase activity slowly   Complete by:  As directed      Allergies as of 01/13/2018      Reactions   Other Anaphylaxis, Swelling   Walnuts = Throat swells   Penicillins Rash   Has patient had a PCN reaction causing immediate rash,  facial/tongue/throat swelling, SOB or lightheadedness with hypotension: Yes Has patient had a PCN reaction causing severe rash involving mucus membranes or skin necrosis: Unk Has patient had a PCN reaction that required hospitalization: Unk Has patient had a PCN reaction occurring within the last 10 years: No If all of the  above answers are "NO", then may proceed with Cephalosporin use.      Medication List    STOP taking these medications   nitrofurantoin (macrocrystal-monohydrate) 100 MG capsule Commonly known as:  MACROBID     TAKE these medications   acetaminophen 500 MG tablet Commonly known as:  TYLENOL Take 500-1,000 mg by mouth every 6 (six) hours as needed (for pain).   amLODipine 5 MG tablet Commonly known as:  NORVASC Take 5 mg by mouth daily.   CALCIUM + D3 PO Take 1 tablet by mouth daily.   cloNIDine 0.2 MG tablet Commonly known as:  CATAPRES Take 0.2 mg by mouth 2 (two) times daily.   cycloSPORINE 0.05 % ophthalmic emulsion Commonly known as:  RESTASIS Place 1 drop into both eyes 2 (two) times daily.   enalapril 20 MG tablet Commonly known as:  VASOTEC Take 20 mg by mouth 2 (two) times daily.   ferrous sulfate 325 (65 FE) MG EC tablet Take 1 tablet (325 mg total) by mouth 2 (two) times daily.   furosemide 40 MG tablet Commonly known as:  LASIX Take 40 mg by mouth daily.   isosorbide mononitrate 20 MG tablet Commonly known as:  ISMO,MONOKET Take 20 mg by mouth daily.   metoprolol succinate 50 MG 24 hr tablet Commonly known as:  TOPROL-XL Take 50 mg by mouth daily.   potassium chloride SA 20 MEQ tablet Commonly known as:  K-DUR,KLOR-CON Take 20 mEq by mouth daily.   simvastatin 20 MG tablet Commonly known as:  ZOCOR Take 20 mg by mouth at bedtime.         Diet and Activity recommendation: See Discharge Instructions above   Consults obtained -gust with urology and GYN via phone   Major procedures and Radiology Reports - PLEASE review  detailed and final reports for all details, in brief -      Ct Abdomen Pelvis W Wo Contrast  Result Date: 01/13/2018 CLINICAL DATA:  Hematuria. EXAM: CT ABDOMEN AND PELVIS WITHOUT AND WITH CONTRAST TECHNIQUE: Multidetector CT imaging of the abdomen and pelvis was performed following the standard protocol before and following the bolus administration of intravenous contrast. CONTRAST:  173mL OMNIPAQUE IOHEXOL 300 MG/ML  SOLN COMPARISON:  None. FINDINGS: Lower chest: Visualized portion of the heart appears upper normal to mildly enlarged for size. Coronary artery calcification is evident. Hepatobiliary: No focal abnormality within the liver parenchyma. Calcified gallstone measures up to 2.6 cm diameter. No intrahepatic or extrahepatic biliary dilation. Pancreas: No focal mass lesion. No dilatation of the main duct. No intraparenchymal cyst. No peripancreatic edema. Spleen: No splenomegaly. No focal mass lesion. Adrenals/Urinary Tract: No adrenal nodule or mass. Precontrast imaging shows no stones in either kidney or ureter. No bladder stones. Urine in the collecting system of each kidney has mildly increased attenuation which may be related to concentrated urine or early excretion of intravenous contrast material. Imaging after IV contrast administration demonstrates no suspicious enhancing lesion in either kidney. 1.2 cm well-defined homogeneous low-density lesion in the upper pole of the right kidney is compatible with a cyst. Simple cysts are identified in the left kidney measuring 3.7 cm in the upper pole and 2.5 cm in the interpolar region. Delayed imaging shows no wall thickening or soft tissue filling defect in either intrarenal collecting system or renal pelvis. Left ureter is well opacified and has normal CT imaging features. Incomplete opacification of the right ureter noted without focal dilatation or mass lesion evident. Delayed imaging reveals a 2.0  x 3.4 cm focal area of bladder wall thickening  (axial image 64/series 3 and coronal image 77 series 16). Stomach/Bowel: Tiny hiatal hernia. Stomach otherwise unremarkable. Duodenum is normally positioned as is the ligament of Treitz. No small bowel wall thickening. No small bowel dilatation. The terminal ileum is normal. The appendix is normal and best visualized on coronal imaging. No gross colonic mass. No colonic wall thickening. No substantial diverticular change. Vascular/Lymphatic: There is abdominal aortic atherosclerosis without aneurysm. There is no gastrohepatic or hepatoduodenal ligament lymphadenopathy. No intraperitoneal or retroperitoneal lymphadenopathy. No pelvic sidewall lymphadenopathy. Reproductive: Marked enlargement of the endometrial canal noted with thickness of 3.2 cm on sagittal imaging. Contents within the endometrial canal or heterogeneous. There is no adnexal mass. Other: None. Musculoskeletal: Small left groin hernia contains trace fluid. No worrisome lytic or sclerotic osseous abnormality. IMPRESSION: 1. Ill-defined 2.0 x 3.4 cm focal area of bladder wall thickening/mass-effect. Features concerning for urothelial neoplasm. 2. Marked heterogeneity and thickening of the endometrial cavity. Imaging features highly suspicious for endometrial neoplasm in a postmenopausal female. Pelvic ultrasound recommended to further evaluate. 3. Cholelithiasis. 4.  Aortic Atherosclerois (ICD10-170.0) These results will be called to the ordering clinician or representative by the Radiologist Assistant, and communication documented in the PACS or zVision Dashboard. Electronically Signed   By: Misty Stanley M.D.   On: 01/13/2018 09:53   Dg Chest Port 1 View  Result Date: 01/11/2018 CLINICAL DATA:  84 year old female with increasing shortness of breath over the last few months. Weakness. Anemic. EXAM: PORTABLE CHEST 1 VIEW COMPARISON:  None. FINDINGS: Portable AP semi upright view at 1928 hours. Mild cardiomegaly. Other mediastinal contours are within  normal limits. Visualized tracheal air column is within normal limits. Allowing for portable technique the lungs are clear. Negative visible bowel gas pattern. No acute osseous abnormality identified. IMPRESSION: Mild cardiomegaly.  No acute cardiopulmonary abnormality. Electronically Signed   By: Genevie Ann M.D.   On: 01/11/2018 19:43    Micro Results   No results found for this or any previous visit (from the past 240 hour(s)).     Today   Subjective:   Mellisa Arshad today has no headache,no chest abdominal pain,no new weakness tingling or numbness, feels much better wants to go home today.   Objective:   Blood pressure 139/76, pulse 67, temperature 98.7 F (37.1 C), temperature source Oral, resp. rate 17, height 5\' 2"  (1.575 m), weight 83.1 kg (183 lb 3.2 oz), SpO2 97 %.   Intake/Output Summary (Last 24 hours) at 01/13/2018 1417 Last data filed at 01/13/2018 0933 Gross per 24 hour  Intake 438 ml  Output 400 ml  Net 38 ml    Exam Awake Alert, Oriented x 3, No new F.N deficits, Normal affect Symmetrical Chest wall movement, Good air movement bilaterally, CTAB RRR,No Gallops,Rubs or new Murmurs, No Parasternal Heave +ve B.Sounds, Abd Soft, Non tender,  No rebound -guarding or rigidity. No Cyanosis, Clubbing or edema, No new Rash or bruise  Data Review   CBC w Diff:  Lab Results  Component Value Date   WBC 5.9 01/13/2018   HGB 8.2 (L) 01/13/2018   HCT 28.7 (L) 01/13/2018   PLT 240 01/13/2018   LYMPHOPCT 28 01/11/2018   MONOPCT 10 01/11/2018   EOSPCT 1 01/11/2018   BASOPCT 0 01/11/2018    CMP:  Lab Results  Component Value Date   NA 137 01/12/2018   K 3.8 01/12/2018   CL 102 01/12/2018   CO2 27 01/12/2018  BUN 10 01/12/2018   CREATININE 0.77 01/12/2018   PROT 7.0 01/11/2018   ALBUMIN 4.2 01/11/2018   BILITOT 0.4 01/11/2018   ALKPHOS 69 01/11/2018   AST 21 01/11/2018   ALT 11 (L) 01/11/2018  .   Total Time in preparing paper work, data evaluation and  todays exam - 12 minutes  Phillips Climes M.D on 01/13/2018 at 2:17 PM  [facility]   Office  [phone]

## 2018-01-13 NOTE — Discharge Instructions (Signed)
Follow with Primary MD Brenda Ebbs, MD in 2 days   Get CBC, CMP,  checked  by Primary MD next visit.    Activity: As tolerated with Full fall precautions use walker/cane & assistance as needed   Disposition Home   Diet: Heart Healthy  , with feeding assistance and aspiration precautions.  For Heart failure patients - Check your Weight same time everyday, if you gain over 2 pounds, or you develop in leg swelling, experience more shortness of breath or chest pain, call your Primary MD immediately. Follow Cardiac Low Salt Diet and 1.5 lit/day fluid restriction.   On your next visit with your primary care physician please Get Medicines reviewed and adjusted.   Please request your Prim.MD to go over all Hospital Tests and Procedure/Radiological results at the follow up, please get all Hospital records sent to your Prim MD by signing hospital release before you go home.   If you experience worsening of your admission symptoms, develop shortness of breath, life threatening emergency, suicidal or homicidal thoughts you must seek medical attention immediately by calling 911 or calling your MD immediately  if symptoms less severe.  You Must read complete instructions/literature along with all the possible adverse reactions/side effects for all the Medicines you take and that have been prescribed to you. Take any new Medicines after you have completely understood and accpet all the possible adverse reactions/side effects.   Do not drive, operating heavy machinery, perform activities at heights, swimming or participation in water activities or provide baby sitting services if your were admitted for syncope or siezures until you have seen by Primary MD or a Neurologist and advised to do so again.  Do not drive when taking Pain medications.    Do not take more than prescribed Pain, Sleep and Anxiety Medications  Special Instructions: If you have smoked or chewed Tobacco  in the last 2 yrs  please stop smoking, stop any regular Alcohol  and or any Recreational drug use.  Wear Seat belts while driving.   Please note  You were cared for by a hospitalist during your hospital stay. If you have any questions about your discharge medications or the care you received while you were in the hospital after you are discharged, you can call the unit and asked to speak with the hospitalist on call if the hospitalist that took care of you is not available. Once you are discharged, your primary care physician will handle any further medical issues. Please note that NO REFILLS for any discharge medications will be authorized once you are discharged, as it is imperative that you return to your primary care physician (or establish a relationship with a primary care physician if you do not have one) for your aftercare needs so that they can reassess your need for medications and monitor your lab values.

## 2018-01-13 NOTE — Progress Notes (Signed)
Received call from radiology concerning CT results of abdomen.pelvis. MD paged.

## 2018-01-13 NOTE — Consult Note (Signed)
   OB/GYN Telephone Consult  01/13/18   Brenda Alexander is a 84 y.o.  With medical history significant for CAD, CHF, HTN, HLD currently admitted for anemia (HGB=8.2) and hematuria. Work up included a CT Abd/pelvis for hematuria which showed a mass in the bladder and endometrial thickening.   I was called for a consult regarding the care of this patient by [facility] [place]  The provider had a clinical question about endometrial thickening and next steps for outpatient follow up.   The provider presented the following relevant clinical information: - CT findings - work up for anemia- consistent with chronic blood loss Fe anemia - Urology is involved for hematuria work up  I performed a chart review on the patient and reviewed available documentation.  BP 139/76 (BP Location: Left Arm)   Pulse 67   Temp 98.7 F (37.1 C) (Oral)   Resp 17   Ht 5\' 2"  (1.575 m)   Wt 183 lb 3.2 oz (83.1 kg)   SpO2 97%   BMI 33.51 kg/m   Exam- performed by consulting provider   Recommendations:  - Added on blood work to help with endometrial pathology evaluation- CA125 and CEA - Ordered transvaginal US to better define endometrial thickening - Patient will need an endometrial biopsy as part of her work up [employer] [facility] to coordinate care of the patient and establishing an outpatient follow up plan. I spoke with Melissa at [facility]. She reviewed the imaging and will talk to Dr. Denman George who is currently in the operating room regarding this patient. 6:24 PM - heard from [facility] and they would like the patient to have endometrial sampling prior to referral. I will send a basket message to the [facility]  to have the patient scheduled for biopsy.  - I have personally placed the contact information for GYN/ONC and the [facility] in the patient's discharge navigator.    Thank you for this consult and if additional recommendations are needed please call [phone] for the OB/GYN attending  on service at [facility].   I spent approximately 10 minutes directly consulting with the provider and verbally discussing this case. Additionally 10 minutes minutes was spent performing chart review and documentation.    Caren Macadam, MD, MPH, ABFM Attending [place] for [place] for phone consult billing? (If answer to any of these are yes then you cannot bill this telephone consult) Will the patient be seen urgently (within 24hrs) at a [facility] practice? No Is this a patient on which I performed surgery within the last 7d? No Have you billed a telephone consult on this patient in the last 7d? No  CPT code [phone]

## 2018-01-14 ENCOUNTER — Other Ambulatory Visit: Payer: Self-pay | Admitting: Family Medicine

## 2018-01-14 ENCOUNTER — Telehealth: Payer: Self-pay | Admitting: *Deleted

## 2018-01-14 ENCOUNTER — Telehealth: Payer: Self-pay | Admitting: Radiology

## 2018-01-14 DIAGNOSIS — N949 Unspecified condition associated with female genital organs and menstrual cycle: Secondary | ICD-10-CM

## 2018-01-14 DIAGNOSIS — N859 Noninflammatory disorder of uterus, unspecified: Secondary | ICD-10-CM

## 2018-01-14 LAB — CEA: CEA: 1.3 ng/mL (ref 0.0–4.7)

## 2018-01-14 LAB — URINE CULTURE: Culture: NO GROWTH

## 2018-01-14 LAB — CA 125: Cancer Antigen (CA) 125: 15.9 U/mL (ref 0.0–38.1)

## 2018-01-14 NOTE — Telephone Encounter (Signed)
Patient's daughter called stating that someone from our office called to set up and appointment with her mother Brenda Alexander, I informed the patient the referral notes states that her mother needs to follow-up with Dr. Karl Pock office for a endometrial biopsy, and then if a referral is made for onc gyn Dr. Ernestina Patches will make the referral at that time.  I gave the daughter Dr. Romona Curls office phone number at [phone].  Daughter verbalized understanding.

## 2018-01-14 NOTE — Telephone Encounter (Signed)
Left message on the voicemial to call [id number] to schedule appointment with Dr Ernestina Patches for endometrial bx in 2-3 weeks. Office number provided

## 2018-01-20 ENCOUNTER — Other Ambulatory Visit (HOSPITAL_COMMUNITY): Payer: Self-pay | Admitting: Family Medicine

## 2018-01-20 ENCOUNTER — Ambulatory Visit (INDEPENDENT_AMBULATORY_CARE_PROVIDER_SITE_OTHER): Payer: Medicare HMO | Admitting: Family Medicine

## 2018-01-20 ENCOUNTER — Other Ambulatory Visit (HOSPITAL_COMMUNITY)
Admission: RE | Admit: 2018-01-20 | Discharge: 2018-01-20 | Disposition: A | Discharge status: Home / Self Care | Payer: Medicare HMO | Source: Ambulatory Visit | Attending: Family Medicine | Admitting: Family Medicine

## 2018-01-20 ENCOUNTER — Encounter: Payer: Self-pay | Admitting: Family Medicine

## 2018-01-20 VITALS — BP 166/61 | HR 80 | Ht 62.0 in | Wt 201.4 lb

## 2018-01-20 DIAGNOSIS — N9489 Other specified conditions associated with female genital organs and menstrual cycle: Secondary | ICD-10-CM | POA: Insufficient documentation

## 2018-01-20 DIAGNOSIS — N950 Postmenopausal bleeding: Secondary | ICD-10-CM | POA: Insufficient documentation

## 2018-01-20 DIAGNOSIS — Z1151 Encounter for screening for human papillomavirus (HPV): Secondary | ICD-10-CM | POA: Insufficient documentation

## 2018-01-20 NOTE — Progress Notes (Signed)
GYNECOLOGY Problem Visit- Postmenopausal bleeding  Subjective:   Brenda Alexander is a 84 y.o. [id number] female here for a problem GYN visit.  Current complaints: postmenopausal bleeding. Recently admitted to the [facility] for chronic blood loss anemia and found to have a bladder mass as well as an endometrial mass.  While in patient she received a blood transfusion.  She reports vaginal bleeding for several months.  Reports last pap was "years ago." Reports blood in urine as well.  Denies abnormal discharge, pelvic pain, problems with intercourse or other gynecologic concerns.    Reviewed:  CT scan on 01/13/2018-- notable for  Ill-defined 2.0 x 3.4 cm focal area of bladder wall thickening/mass-effect. Features concerning for urothelial neoplasm.  Marked heterogeneity and thickening of the endometrial cavity. Imaging features highly suspicious for endometrial neoplasm in a postmenopausal female. Pelvic ultrasound recommended to further  Lab findings:  CA-125 - low risk (15.9) CEA- Low risk (1.3)  Gynecologic History No LMP recorded. Patient is postmenopausal. Contraception: none Last Pap: "years ago" Results were: normal Last mammogram: 08/26/2016. Results were: normal  The following portions of the patient's history were reviewed and updated as appropriate: allergies, current medications, past family history, past medical history, past social history, past surgical history and problem list.  Review of Systems Pertinent items are noted in HPI.   Objective:  BP (!) 166/61   Pulse 80   Ht 5\' 2"  (1.575 m)   Wt 201 lb 6.4 oz (91.4 kg)   BMI 36.84 kg/m  CONSTITUTIONAL: Well-developed, well-nourished female in no acute distress.  HENT:  Normocephalic, atraumatic, External right and left ear normal. Oropharynx is clear and moist EYES:  No scleral icterus.  NECK: Normal range of motion, supple, no masses.  Normal thyroid.  SKIN: Skin is warm and dry. No rash noted. Not diaphoretic. No erythema.  No pallor. NEUROLOGIC: Alert and oriented to person, place, and time. Normal reflexes, muscle tone coordination. No cranial nerve deficit noted. PSYCHIATRIC: Normal mood and affect. Normal behavior. Normal judgment and thought content. CARDIOVASCULAR: Normal heart rate noted, regular rhythm. 2+ distal pulses. RESPIRATORY: Effort and breath sounds normal, no problems with respiration noted. BREASTS: Symmetric in size. No masses, skin changes, nipple drainage, or lymphadenopathy. ABDOMEN: Soft,  no distention noted.  No tenderness, rebound or guarding.  PELVIC: Normal appearing external genitalia, minimal pubic hair; normal appearing vaginal mucosa and multiparous appearing cervix.  No abnormal discharge noted.  Pap smear obtained.  Normal uterine size, no other palpable masses, no uterine or adnexal tenderness. MUSCULOSKELETAL: Normal range of motion.   ENDOMETRIAL BIOPSY     The indications for endometrial biopsy were reviewed.   Risks of the biopsy including cramping, bleeding, infection, uterine perforation, inadequate specimen and need for additional procedures  were discussed. The patient states she understands and agrees to undergo procedure today. Consent was signed. Time out was performed. Urine HCG was negative. During the pelvic exam, the cervix was prepped with Betadine. A single-toothed tenaculum was placed on the anterior lip of the cervix to stabilize it. The 3 mm pipelle was introduced into the endometrial cavity without difficulty to a depth of 7cm, and a moderate amount of tissue was obtained and sent to pathology. The instruments were removed from the patient's vagina. Minimal bleeding from the cervix was noted. The patient tolerated the procedure well. Routine post-procedure instructions were given to the patient.     Assessment and Plan:  1. Endometrial mass - Concerning for endometrial pathology. Discussed with patient and  her daughter that in the setting of new vaginal bleeding  there is a concern for endometrial cancer.  Reviewed that TVUS is still necessary to help evaluate the mass seen on CT.  I discussed possible referral to GYN ONC pending the results of the biopsy - Surgical pathology - Cytology - PAP - Patient signed for results to be able to released to her daughter.   2. Postmenopausal bleeding See above - Surgical pathology - Cytology - PAP   Please refer to After Visit Summary for other counseling recommendations.   Return in about 1 month (around 02/17/2018). Will call with results.  Caren Macadam, MD, MPH, ABFM Attending [place] for [facility]

## 2018-01-21 ENCOUNTER — Ambulatory Visit (HOSPITAL_COMMUNITY)
Admission: RE | Admit: 2018-01-21 | Discharge: 2018-01-21 | Disposition: A | Discharge status: Home / Self Care | Payer: Medicare HMO | Source: Ambulatory Visit | Attending: Family Medicine | Admitting: Family Medicine

## 2018-01-21 DIAGNOSIS — N950 Postmenopausal bleeding: Secondary | ICD-10-CM | POA: Diagnosis not present

## 2018-01-21 DIAGNOSIS — R9389 Abnormal findings on diagnostic imaging of other specified body structures: Secondary | ICD-10-CM | POA: Insufficient documentation

## 2018-01-21 DIAGNOSIS — N949 Unspecified condition associated with female genital organs and menstrual cycle: Secondary | ICD-10-CM

## 2018-01-21 DIAGNOSIS — N859 Noninflammatory disorder of uterus, unspecified: Secondary | ICD-10-CM

## 2018-01-21 LAB — CYTOLOGY - PAP
Diagnosis: NEGATIVE
HPV: NOT DETECTED

## 2018-02-10 ENCOUNTER — Encounter: Payer: Self-pay | Admitting: Family Medicine

## 2018-02-10 ENCOUNTER — Telehealth: Payer: Self-pay | Admitting: Radiology

## 2018-02-10 NOTE — Telephone Encounter (Signed)
Left voicemail to call [facility] for lab results per Dr Ernestina Patches

## 2018-02-12 ENCOUNTER — Telehealth: Payer: Self-pay

## 2018-02-12 NOTE — Telephone Encounter (Signed)
Called patient to inform her of test results/recommendation per Dr.Newton no answer or voice mail to leave a message.

## 2018-04-07 ENCOUNTER — Encounter (HOSPITAL_COMMUNITY): Payer: Self-pay

## 2018-04-07 ENCOUNTER — Encounter: Payer: Self-pay | Admitting: Family Medicine

## 2018-04-07 ENCOUNTER — Ambulatory Visit (INDEPENDENT_AMBULATORY_CARE_PROVIDER_SITE_OTHER): Payer: Medicare HMO | Admitting: Family Medicine

## 2018-04-07 VITALS — BP 165/71 | HR 83 | Resp 16 | Ht 62.0 in | Wt 197.8 lb

## 2018-04-07 DIAGNOSIS — Z23 Encounter for immunization: Secondary | ICD-10-CM

## 2018-04-07 DIAGNOSIS — D649 Anemia, unspecified: Secondary | ICD-10-CM | POA: Diagnosis not present

## 2018-04-07 DIAGNOSIS — N950 Postmenopausal bleeding: Secondary | ICD-10-CM

## 2018-04-07 NOTE — Progress Notes (Signed)
   GYNECOLOGY Problem Visit- Postmenopausal bleeding  Subjective:   Brenda Alexander is a 84 y.o. [id number] female here for a problem GYN visit.  Current complaints: postmenopausal bleeding. Since last visit she has continued to have bleeding that is intermittent. She was seen by urology that did a cystoscopy and per patient's daughter they did not see anything in the bladder. She also had a PCP visit in the end of August and had a hemoglobin check and "heard everything was ok" but does not know the level.  Reports no dizziness or lightheadedness. Desires Tdap and Flu shot today.    Reviewed:  CT scan on 01/13/2018-- notable for  Ill-defined 2.0 x 3.4 cm focal area of bladder wall thickening/mass-effect. Features concerning for urothelial neoplasm.  Marked heterogeneity and thickening of the endometrial cavity. Imaging features highly suspicious for endometrial neoplasm in a postmenopausal female. Pelvic ultrasound recommended to further  Lab findings:  CA-125 - low risk (15.9) CEA- Low risk (1.3)  GYN [place] on 6/27 IMPRESSION: 1. Heterogeneous, fluid filled endometrium, abnormal in this postmenopausal patient. In the setting of post-menopausal bleeding, endometrial sampling is indicated to exclude carcinoma. If results are benign, sonohysterogram should be considered for focal lesion work-up. (Ref: Radiological Reasoning: Algorithmic Workup of Abnormal Vaginal Bleeding with Endovaginal Sonography and Sonohysterography. AJR 2008; 454:U98-11) 2. Ovaries are not visualized.  No adnexal mass identified. 3. Question of posterior bladder wall thickening. See above. Consider follow-up ultrasound of the urinary bladder (with full bladder) in 4-6 weeks to determine if this is a persistent finding.  Endometrial Bx on 6/26 showed Benign endometrial polyp Cervical pap, negative on 6/26  Gynecologic History No LMP recorded. Patient is postmenopausal. Contraception: none Last Pap: June 2019 Results  were: normal Last mammogram: 08/26/2016. Results were: normal  The following portions of the patient's history were reviewed and updated as appropriate: allergies, current medications, past family history, past medical history, past social history, past surgical history and problem list.  Review of Systems Pertinent items are noted in HPI.   Objective:  BP (!) 165/71 (BP Location: Left Arm, Patient Position: Sitting, Cuff Size: Normal)   Pulse 83   Resp 16   Ht 5\' 2"  (1.575 m)   Wt 197 lb 12.8 oz (89.7 kg)   BMI 36.18 kg/m  Gen: well appearing, NAD HEENT: no scleral icterus CV: RR Lung: Normal WOB Ext: warm well perfused    Assessment and Plan:  1. Endometrial thickening - Reassuring that endometrial biopsy was benign but given grossly thickened endometrial needs more definitive management.  -  Recommended Hysteroscopy/DC with Dr Nolen Mu - will check cbc today, if hgb<10 will schedule outpatient blood transfusion before surgery - appt in 1-2 weeks with Dr Harolyn Rutherford to discussed surgery - sent message to scheduler to get case booked with Dr Harolyn Rutherford - Has PCP appt on 10/2 and asked for Medical Clearance exam -- daughter aware this is needed prior to surgery.  She will given fax # to PCP   2. Elevated BP- took med < 30 min before arrival. No concerning sx.   Please refer to After Visit Summary for other counseling recommendations.   Return in about 2 weeks (around 04/21/2018) for Pre Op with Dr Harolyn Rutherford, discussed Hysteroscopy/DC.   Caren Macadam, MD, MPH, ABFM Attending [place] for [facility]

## 2018-04-07 NOTE — Patient Instructions (Signed)
Tdap Vaccine (Tetanus, Diphtheria and Pertussis): What You Need to Know 1. Why get vaccinated? Tetanus, diphtheria and pertussis are very serious diseases. Tdap vaccine can protect us from these diseases. And, Tdap vaccine given to pregnant women can protect newborn babies against pertussis. TETANUS (Lockjaw) is rare in the [place] today. It causes painful muscle tightening and stiffness, usually all over the body.  It can lead to tightening of muscles in the head and neck so you can't open your mouth, swallow, or sometimes even breathe. Tetanus kills about 1 out of 10 people who are infected even after receiving the best medical care. DIPHTHERIA is also rare in the [place] today. It can cause a thick coating to form in the back of the throat.  It can lead to breathing problems, heart failure, paralysis, and death. PERTUSSIS (Whooping Cough) causes severe coughing spells, which can cause difficulty breathing, vomiting and disturbed sleep.  It can also lead to weight loss, incontinence, and rib fractures. Up to 2 in 100 adolescents and 5 in 100 adults with pertussis are hospitalized or have complications, which could include pneumonia or death. These diseases are caused by bacteria. Diphtheria and pertussis are spread from person to person through secretions from coughing or sneezing. Tetanus enters the body through cuts, scratches, or wounds. Before vaccines, as many as 200,000 cases of diphtheria, 200,000 cases of pertussis, and hundreds of cases of tetanus, were reported in the [place] each year. Since vaccination began, reports of cases for tetanus and diphtheria have dropped by about 99% and for pertussis by about 80%. 2. Tdap vaccine Tdap vaccine can protect adolescents and adults from tetanus, diphtheria, and pertussis. One dose of Tdap is routinely given at age 11 or 12. People who did not get Tdap at that age should get it as soon as possible. Tdap is especially important  for healthcare professionals and anyone having close contact with a baby younger than 12 months. Pregnant women should get a dose of Tdap during every pregnancy, to protect the newborn from pertussis. Infants are most at risk for severe, life-threatening complications from pertussis. Another vaccine, called Td, protects against tetanus and diphtheria, but not pertussis. A Td booster should be given every 10 years. Tdap may be given as one of these boosters if you have never gotten Tdap before. Tdap may also be given after a severe cut or burn to prevent tetanus infection. Your doctor or the person giving you the vaccine can give you more information. Tdap may safely be given at the same time as other vaccines. 3. Some people should not get this vaccine  A person who has ever had a life-threatening allergic reaction after a previous dose of any diphtheria, tetanus or pertussis containing vaccine, OR has a severe allergy to any part of this vaccine, should not get Tdap vaccine. Tell the person giving the vaccine about any severe allergies.  Anyone who had coma or long repeated seizures within 7 days after a childhood dose of DTP or DTaP, or a previous dose of Tdap, should not get Tdap, unless a cause other than the vaccine was found. They can still get Td.  Talk to your doctor if you:  have seizures or another nervous system problem,  had severe pain or swelling after any vaccine containing diphtheria, tetanus or pertussis,  ever had a condition called Guillain-Barr Syndrome (GBS),  aren't feeling well on the day the shot is scheduled. 4. Risks With any medicine, including vaccines, there is   a chance of side effects. These are usually mild and go away on their own. Serious reactions are also possible but are rare. Most people who get Tdap vaccine do not have any problems with it. Mild problems following Tdap: (Did not interfere with activities)  Pain where the shot was given (about 3 in 4  adolescents or 2 in 3 adults)  Redness or swelling where the shot was given (about 1 person in 5)  Mild fever of at least 100.4F (up to about 1 in 25 adolescents or 1 in 100 adults)  Headache (about 3 or 4 people in 10)  Tiredness (about 1 person in 3 or 4)  Nausea, vomiting, diarrhea, stomach ache (up to 1 in 4 adolescents or 1 in 10 adults)  Chills, sore joints (about 1 person in 10)  Body aches (about 1 person in 3 or 4)  Rash, swollen glands (uncommon) Moderate problems following Tdap: (Interfered with activities, but did not require medical attention)  Pain where the shot was given (up to 1 in 5 or 6)  Redness or swelling where the shot was given (up to about 1 in 16 adolescents or 1 in 12 adults)  Fever over 102F (about 1 in 100 adolescents or 1 in 250 adults)  Headache (about 1 in 7 adolescents or 1 in 10 adults)  Nausea, vomiting, diarrhea, stomach ache (up to 1 or 3 people in 100)  Swelling of the entire arm where the shot was given (up to about 1 in 500). Severe problems following Tdap: (Unable to perform usual activities; required medical attention)  Swelling, severe pain, bleeding and redness in the arm where the shot was given (rare). Problems that could happen after any vaccine:  People sometimes faint after a medical procedure, including vaccination. Sitting or lying down for about 15 minutes can help prevent fainting, and injuries caused by a fall. Tell your doctor if you feel dizzy, or have vision changes or ringing in the ears.  Some people get severe pain in the shoulder and have difficulty moving the arm where a shot was given. This happens very rarely.  Any medication can cause a severe allergic reaction. Such reactions from a vaccine are very rare, estimated at fewer than 1 in a million doses, and would happen within a few minutes to a few hours after the vaccination. As with any medicine, there is a very remote chance of a vaccine causing a serious  injury or death. The safety of vaccines is always being monitored. For more information, visit: www.cdc.gov/vaccinesafety/ 5. What if there is a serious problem? What should I look for? Look for anything that concerns you, such as signs of a severe allergic reaction, very high fever, or unusual behavior. Signs of a severe allergic reaction can include hives, swelling of the face and throat, difficulty breathing, a fast heartbeat, dizziness, and weakness. These would usually start a few minutes to a few hours after the vaccination. What should I do?  If you think it is a severe allergic reaction or other emergency that can't wait, call 9-1-1 or get the person to the nearest hospital. Otherwise, call your doctor.  Afterward, the reaction should be reported to the Vaccine Adverse Event Reporting System (VAERS). Your doctor might file this report, or you can do it yourself through the VAERS web site at www.vaers.hhs.gov, or by calling 1-800-822-7967.  VAERS does not give medical advice. 6. The National Vaccine Injury Compensation Program The National Vaccine Injury Compensation Program (VICP) is   a federal program that was created to compensate people who may have been injured by certain vaccines. Persons who believe they may have been injured by a vaccine can learn about the program and about filing a claim by calling 1-800-338-2382 or visiting the VICP website at www.hrsa.gov/vaccinecompensation. There is a time limit to file a claim for compensation. 7. How can I learn more?  Ask your doctor. He or she can give you the vaccine package insert or suggest other sources of information.  Call your local or state health department.  Contact the Centers for Disease Control and Prevention (CDC):  Call 1-800-232-4636 (1-800-CDC-INFO) or  Visit CDC's website at www.cdc.gov/vaccines CDC Tdap Vaccine VIS (09/20/13) This information is not intended to replace advice given to you by your health care  provider. Make sure you discuss any questions you have with your health care provider. Document Released: 01/13/2012 Document Revised: 04/03/2016 Document Reviewed: 04/03/2016 Elsevier Interactive Patient Education  2017 [employer]. Influenza (Flu) Vaccine (Inactivated or Recombinant): What You Need to Know 1. Why get vaccinated? Influenza ("flu") is a contagious disease that spreads around the [place] every year, usually between October and May. Flu is caused by influenza viruses, and is spread mainly by coughing, sneezing, and close contact. Anyone can get flu. Flu strikes suddenly and can last several days. Symptoms vary by age, but can include:  fever/chills  sore throat  muscle aches  fatigue  cough  headache  runny or stuffy nose Flu can also lead to pneumonia and blood infections, and cause diarrhea and seizures in children. If you have a medical condition, such as heart or lung disease, flu can make it worse. Flu is more dangerous for some people. Infants and young children, people 65 years of age and older, pregnant women, and people with certain health conditions or a weakened immune system are at greatest risk. Each year thousands of people in the [place] die from flu, and many more are hospitalized. Flu vaccine can:  keep you from getting flu,  make flu less severe if you do get it, and  keep you from spreading flu to your family and other people. 2. Inactivated and recombinant flu vaccines A dose of flu vaccine is recommended every flu season. Children 6 months through 8 years of age may need two doses during the same flu season. Everyone else needs only one dose each flu season. Some inactivated flu vaccines contain a very small amount of a mercury-based preservative called thimerosal. Studies have not shown thimerosal in vaccines to be harmful, but flu vaccines that do not contain thimerosal are available. There is no live flu virus in flu shots. They  cannot cause the flu. There are many flu viruses, and they are always changing. Each year a new flu vaccine is made to protect against three or four viruses that are likely to cause disease in the upcoming flu season. But even when the vaccine doesn't exactly match these viruses, it may still provide some protection. Flu vaccine cannot prevent:  flu that is caused by a virus not covered by the vaccine, or  illnesses that look like flu but are not. It takes about 2 weeks for protection to develop after vaccination, and protection lasts through the flu season. 3. Some people should not get this vaccine Tell the person who is giving you the vaccine:  If you have any severe, life-threatening allergies. If you ever had a life-threatening allergic reaction after a dose of flu vaccine, or   have a severe allergy to any part of this vaccine, you may be advised not to get vaccinated. Most, but not all, types of flu vaccine contain a small amount of egg protein.  If you ever had Guillain-Barr Syndrome (also called GBS). Some people with a history of GBS should not get this vaccine. This should be discussed with your doctor.  If you are not feeling well. It is usually okay to get flu vaccine when you have a mild illness, but you might be asked to come back when you feel better. 4. Risks of a vaccine reaction With any medicine, including vaccines, there is a chance of reactions. These are usually mild and go away on their own, but serious reactions are also possible. Most people who get a flu shot do not have any problems with it. Minor problems following a flu shot include:  soreness, redness, or swelling where the shot was given  hoarseness  sore, red or itchy eyes  cough  fever  aches  headache  itching  fatigue If these problems occur, they usually begin soon after the shot and last 1 or 2 days. More serious problems following a flu shot can include the following:  There may be a  small increased risk of Guillain-Barre Syndrome (GBS) after inactivated flu vaccine. This risk has been estimated at 1 or 2 additional cases per million people vaccinated. This is much lower than the risk of severe complications from flu, which can be prevented by flu vaccine.  Young children who get the flu shot along with pneumococcal vaccine (PCV13) and/or DTaP vaccine at the same time might be slightly more likely to have a seizure caused by fever. Ask your doctor for more information. Tell your doctor if a child who is getting flu vaccine has ever had a seizure. Problems that could happen after any injected vaccine:  People sometimes faint after a medical procedure, including vaccination. Sitting or lying down for about 15 minutes can help prevent fainting, and injuries caused by a fall. Tell your doctor if you feel dizzy, or have vision changes or ringing in the ears.  Some people get severe pain in the shoulder and have difficulty moving the arm where a shot was given. This happens very rarely.  Any medication can cause a severe allergic reaction. Such reactions from a vaccine are very rare, estimated at about 1 in a million doses, and would happen within a few minutes to a few hours after the vaccination. As with any medicine, there is a very remote chance of a vaccine causing a serious injury or death. The safety of vaccines is always being monitored. For more information, visit: www.cdc.gov/vaccinesafety/ 5. What if there is a serious reaction? What should I look for? Look for anything that concerns you, such as signs of a severe allergic reaction, very high fever, or unusual behavior. Signs of a severe allergic reaction can include hives, swelling of the face and throat, difficulty breathing, a fast heartbeat, dizziness, and weakness. These would start a few minutes to a few hours after the vaccination. What should I do?  If you think it is a severe allergic reaction or other emergency  that can't wait, call 9-1-1 and get the person to the nearest hospital. Otherwise, call your doctor.  Reactions should be reported to the Vaccine Adverse Event Reporting System (VAERS). Your doctor should file this report, or you can do it yourself through the VAERS web site at www.vaers.hhs.gov, or by calling 1-800-822-7967.    VAERS does not give medical advice. 6. The National Vaccine Injury Compensation Program The National Vaccine Injury Compensation Program (VICP) is a federal program that was created to compensate people who may have been injured by certain vaccines. Persons who believe they may have been injured by a vaccine can learn about the program and about filing a claim by calling 1-800-338-2382 or visiting the VICP website at www.hrsa.gov/vaccinecompensation. There is a time limit to file a claim for compensation. 7. How can I learn more?  Ask your healthcare provider. He or she can give you the vaccine package insert or suggest other sources of information.  Call your local or state health department.  Contact the Centers for Disease Control and Prevention (CDC):  Call 1-800-232-4636 (1-800-CDC-INFO) or  Visit CDC's website at www.cdc.gov/flu Vaccine Information Statement, Inactivated Influenza Vaccine (03/03/2014) This information is not intended to replace advice given to you by your health care provider. Make sure you discuss any questions you have with your health care provider. Document Released: 05/08/2006 Document Revised: 04/03/2016 Document Reviewed: 04/03/2016 Elsevier Interactive Patient Education  2017 [employer]vier Inc.  

## 2018-04-08 LAB — CBC
Hematocrit: 26.8 % — ABNORMAL LOW (ref 34.0–46.6)
Hemoglobin: 8.4 g/dL — ABNORMAL LOW (ref 11.1–15.9)
MCH: 26.2 pg — ABNORMAL LOW (ref 26.6–33.0)
MCHC: 31.3 g/dL — ABNORMAL LOW (ref 31.5–35.7)
MCV: 84 fL (ref 79–97)
Platelets: 282 10*3/uL (ref 150–450)
RBC: 3.21 x10E6/uL — ABNORMAL LOW (ref 3.77–5.28)
RDW: 15.4 % (ref 12.3–15.4)
WBC: 4 10*3/uL (ref 3.4–10.8)

## 2018-04-09 DIAGNOSIS — N950 Postmenopausal bleeding: Secondary | ICD-10-CM | POA: Diagnosis present

## 2018-04-19 ENCOUNTER — Ambulatory Visit (INDEPENDENT_AMBULATORY_CARE_PROVIDER_SITE_OTHER): Payer: Medicare HMO | Admitting: Obstetrics & Gynecology

## 2018-04-19 ENCOUNTER — Encounter: Payer: Self-pay | Admitting: Obstetrics & Gynecology

## 2018-04-19 VITALS — BP 136/74 | HR 60 | Resp 16 | Ht 62.0 in | Wt 198.0 lb

## 2018-04-19 DIAGNOSIS — Z01818 Encounter for other preprocedural examination: Secondary | ICD-10-CM | POA: Diagnosis not present

## 2018-04-19 DIAGNOSIS — R9389 Abnormal findings on diagnostic imaging of other specified body structures: Secondary | ICD-10-CM

## 2018-04-19 DIAGNOSIS — N950 Postmenopausal bleeding: Secondary | ICD-10-CM

## 2018-04-19 NOTE — H&P (View-Only) (Signed)
GYNECOLOGIC SURGERY CONSULT NOTE  History:  84 y.o. [id number] here today for discussion about upcoming surgery.  She is accompanied by her daughter. She has been scheduled for Hysteroscopy, D&C given thickened, heterogenous 3.1 cm endometrium and postmenopausal bleeding. Endometrial biopsy on 6/26 showed fragments of endometrial polyps, no hyperplasia or neoplasia.  She had a negative pap smear also.  Further evaluation was recommended given the concerning ultrasound findings. Today, she reports continued spotting, no other gynecologic symptoms.   Past Medical History:  Diagnosis Date  . Coronary artery disease   . Diabetes mellitus without complication ([place])   . Hyperlipemia   . Hypertension     No past surgical history on file.  The following portions of the patient's history were reviewed and updated as appropriate: allergies, current medications, past family history, past medical history, past social history, past surgical history and problem list.   Health Maintenance:  Normal pap and negative HRHPV on 01/20/2018.  Normal mammogram on 08/25/2016.   Review of Systems:  Pertinent items noted in HPI and remainder of comprehensive ROS otherwise negative.  Objective:  Physical Exam BP 136/74 (BP Location: Right Arm, Patient Position: Sitting, Cuff Size: Normal)   Pulse 60   Resp 16   Ht 5\' 2"  (1.575 m)   Wt 198 lb (89.8 kg)   BMI 36.21 kg/m  CONSTITUTIONAL: Well-developed, well-nourished female in no acute distress.  HEENT:  Normocephalic, atraumatic. External right and left ear normal. No scleral icterus.  NECK: Normal range of motion, supple, no masses noted on observation SKIN: Skin is warm and dry. No rash noted. Not diaphoretic. No erythema. No pallor. MUSCULOSKELETAL: Normal range of motion. No edema noted. NEUROLOGIC: Alert and oriented to person, place, and time. Normal muscle tone coordination. No cranial nerve deficit noted. PSYCHIATRIC: Normal mood and affect. Normal  behavior. Normal judgment and thought content. CARDIOVASCULAR: Normal heart rate noted RESPIRATORY: Effort and breath sounds normal, no problems with respiration noted ABDOMEN: Soft, no distention noted.   PELVIC: Deferred  Labs and Imaging GYN [place] on 6/27 Uterus Measurements: 9.6 x 5.1 x 6.4 centimeters. No fibroids or other mass visualized. Endometrium Thickness: 3.1 centimeter. Heterogeneous appearance. Endometrial fluid is present. Right ovary Measurements: The ovary is not visualized, either absent or obscured. No adnexal mass identified. Left ovary Measurements: The ovary is not visualized, either absent or obscured. No adnexal mass identified. IMPRESSION: 1. Heterogeneous, fluid filled endometrium, abnormal in this postmenopausal patient. In the setting of post-menopausal bleeding, endometrial sampling is indicated to exclude carcinoma. If results are benign, sonohysterogram should be considered for focal lesion work-up. (Ref: Radiological Reasoning: Algorithmic Workup of Abnormal Vaginal Bleeding with Endovaginal Sonography and Sonohysterography. AJR 2008; 517:G01-74) 2. Ovaries are not visualized. No adnexal mass identified. 3. Question of posterior bladder wall thickening. See above. Consider follow-up ultrasound of the urinary bladder (with full bladder) in 4-6 weeks to determine if this is a persistent finding.  Endometrial Bx on 6/26 showed Benign endometrial polyp Cervical pap on 6/26 was negative with negative HPV CA-125 - low risk (15.9) CEA- Low risk (1.3)  Assessment & Plan:  1. Preoperative exam for gynecologic surgery 2. Postmenopausal bleeding 3. Thickened endometrium Patient was counseled about upcoming surgery: Hysteroscopy with Dilation and Curettage.  Risks of surgery were discussed with the patient including but not limited to: bleeding which may require transfusion; infection which may require antibiotics; injury to uterus or surrounding  organs; need for additional procedures including laparotomy or laparoscopy; and other postoperative/anesthesia complications. Routine  preoperative instructions also reviewed, she will be contacted by the OR staff with further instructions. Information was also given to her to review at home.  Bleeding precautions reviewed; told to return for any worsening symptoms prior to or after surgery.   Please refer to After Visit Summary for other counseling recommendations.   Return in about 4 weeks (around 05/17/2018) for Postoperative visit.  Total face-to-face time with patient: 15 minutes.  Over 50% of encounter was spent on counseling and coordination of care.   Verita Schneiders, MD, [place] for [facility], [place]

## 2018-04-19 NOTE — Patient Instructions (Signed)
Hysteroscopy  Hysteroscopy is a procedure used for looking inside the womb (uterus). It may be done for various reasons, including:  · To evaluate abnormal bleeding, fibroid (benign, noncancerous) tumors, polyps, scar tissue (adhesions), and possibly cancer of the uterus.  · To look for lumps (tumors) and other uterine growths.  · To look for causes of why a woman cannot get pregnant (infertility), causes of recurrent loss of pregnancy (miscarriages), or a lost intrauterine device (IUD).  · To perform a sterilization by blocking the fallopian tubes from inside the uterus.    In this procedure, a thin, flexible tube with a tiny light and camera on the end of it (hysteroscope) is used to look inside the uterus. A hysteroscopy should be done right after a menstrual period to be sure you are not pregnant.  LET YOUR HEALTH CARE PROVIDER KNOW ABOUT:  · Any allergies you have.  · All medicines you are taking, including vitamins, herbs, eye drops, creams, and over-the-counter medicines.  · Previous problems you or members of your family have had with the use of anesthetics.  · Any blood disorders you have.  · Previous surgeries you have had.  · Medical conditions you have.  RISKS AND COMPLICATIONS  Generally, this is a safe procedure. However, as with any procedure, complications can occur. Possible complications include:  · Putting a hole in the uterus.  · Excessive bleeding.  · Infection.  · Damage to the cervix.  · Injury to other organs.  · Allergic reaction to medicines.  · Too much fluid used in the uterus for the procedure.    BEFORE THE PROCEDURE  · Ask your health care provider about changing or stopping any regular medicines.  · Do not take aspirin or blood thinners for 1 week before the procedure, or as directed by your health care provider. These can cause bleeding.  · If you smoke, do not smoke for 2 weeks before the procedure.  · In some cases, a medicine is placed in the cervix the day before the procedure.  This medicine makes the cervix have a larger opening (dilate). This makes it easier for the instrument to be inserted into the uterus during the procedure.  · Do not eat or drink anything for at least 8 hours before the surgery.  · Arrange for someone to take you home after the procedure.  PROCEDURE  · You may be given a medicine to relax you (sedative). You may also be given one of the following:  ? A medicine that numbs the area around the cervix (local anesthetic).  ? A medicine that makes you sleep through the procedure (general anesthetic).  · The hysteroscope is inserted through the vagina into the uterus. The camera on the hysteroscope sends a picture to a TV screen. This gives the surgeon a good view inside the uterus.  · During the procedure, air or a liquid is put into the uterus, which allows the surgeon to see better.  · Sometimes, tissue is gently scraped from inside the uterus. These tissue samples are sent to a lab for testing.  What to expect after the procedure  · If you had a general anesthetic, you may be groggy for a couple hours after the procedure.  · If you had a local anesthetic, you will be able to go home as soon as you are stable and feel ready.  · You may have some cramping. This normally lasts for a couple days.  · You may   have bleeding, which varies from light spotting for a few days to menstrual-like bleeding for 3-7 days. This is normal.  · If your test results are not back during the visit, make an appointment with your health care provider to find out the results.  This information is not intended to replace advice given to you by your health care provider. Make sure you discuss any questions you have with your health care provider.  Document Released: 10/20/2000 Document Revised: 12/20/2015 Document Reviewed: 02/10/2013  Elsevier Interactive Patient Education © 2017 Elsevier Inc.

## 2018-04-19 NOTE — Progress Notes (Signed)
GYNECOLOGIC SURGERY CONSULT NOTE  History:  84 y.o. [id number] here today for discussion about upcoming surgery.  She is accompanied by her daughter. She has been scheduled for Hysteroscopy, D&C given thickened, heterogenous 3.1 cm endometrium and postmenopausal bleeding. Endometrial biopsy on 6/26 showed fragments of endometrial polyps, no hyperplasia or neoplasia.  She had a negative pap smear also.  Further evaluation was recommended given the concerning ultrasound findings. Today, she reports continued spotting, no other gynecologic symptoms.   Past Medical History:  Diagnosis Date  . Coronary artery disease   . Diabetes mellitus without complication ([place])   . Hyperlipemia   . Hypertension     No past surgical history on file.  The following portions of the patient's history were reviewed and updated as appropriate: allergies, current medications, past family history, past medical history, past social history, past surgical history and problem list.   Health Maintenance:  Normal pap and negative HRHPV on 01/20/2018.  Normal mammogram on 08/25/2016.   Review of Systems:  Pertinent items noted in HPI and remainder of comprehensive ROS otherwise negative.  Objective:  Physical Exam BP 136/74 (BP Location: Right Arm, Patient Position: Sitting, Cuff Size: Normal)   Pulse 60   Resp 16   Ht 5\' 2"  (1.575 m)   Wt 198 lb (89.8 kg)   BMI 36.21 kg/m  CONSTITUTIONAL: Well-developed, well-nourished female in no acute distress.  HEENT:  Normocephalic, atraumatic. External right and left ear normal. No scleral icterus.  NECK: Normal range of motion, supple, no masses noted on observation SKIN: Skin is warm and dry. No rash noted. Not diaphoretic. No erythema. No pallor. MUSCULOSKELETAL: Normal range of motion. No edema noted. NEUROLOGIC: Alert and oriented to person, place, and time. Normal muscle tone coordination. No cranial nerve deficit noted. PSYCHIATRIC: Normal mood and affect. Normal  behavior. Normal judgment and thought content. CARDIOVASCULAR: Normal heart rate noted RESPIRATORY: Effort and breath sounds normal, no problems with respiration noted ABDOMEN: Soft, no distention noted.   PELVIC: Deferred  Labs and Imaging GYN [place] on 6/27 Uterus Measurements: 9.6 x 5.1 x 6.4 centimeters. No fibroids or other mass visualized. Endometrium Thickness: 3.1 centimeter. Heterogeneous appearance. Endometrial fluid is present. Right ovary Measurements: The ovary is not visualized, either absent or obscured. No adnexal mass identified. Left ovary Measurements: The ovary is not visualized, either absent or obscured. No adnexal mass identified. IMPRESSION: 1. Heterogeneous, fluid filled endometrium, abnormal in this postmenopausal patient. In the setting of post-menopausal bleeding, endometrial sampling is indicated to exclude carcinoma. If results are benign, sonohysterogram should be considered for focal lesion work-up. (Ref: Radiological Reasoning: Algorithmic Workup of Abnormal Vaginal Bleeding with Endovaginal Sonography and Sonohysterography. AJR 2008; 981:X91-47) 2. Ovaries are not visualized. No adnexal mass identified. 3. Question of posterior bladder wall thickening. See above. Consider follow-up ultrasound of the urinary bladder (with full bladder) in 4-6 weeks to determine if this is a persistent finding.  Endometrial Bx on 6/26 showed Benign endometrial polyp Cervical pap on 6/26 was negative with negative HPV CA-125 - low risk (15.9) CEA- Low risk (1.3)  Assessment & Plan:  1. Preoperative exam for gynecologic surgery 2. Postmenopausal bleeding 3. Thickened endometrium Patient was counseled about upcoming surgery: Hysteroscopy with Dilation and Curettage.  Risks of surgery were discussed with the patient including but not limited to: bleeding which may require transfusion; infection which may require antibiotics; injury to uterus or surrounding  organs; need for additional procedures including laparotomy or laparoscopy; and other postoperative/anesthesia complications. Routine  preoperative instructions also reviewed, she will be contacted by the OR staff with further instructions. Information was also given to her to review at home.  Bleeding precautions reviewed; told to return for any worsening symptoms prior to or after surgery.   Please refer to After Visit Summary for other counseling recommendations.   Return in about 4 weeks (around 05/17/2018) for Postoperative visit.  Total face-to-face time with patient: 15 minutes.  Over 50% of encounter was spent on counseling and coordination of care.   Verita Schneiders, MD, [place] for [facility], [place]

## 2018-04-23 NOTE — Patient Instructions (Addendum)
Your procedure is scheduled on:  Thursday, 10/10  Enter through the Main Entrance of [facility] at: 12:45 pm  Pick up the phone at the desk and dial [phone].  Call this number if you have problems the morning of surgery: [phone].  Remember: Do NOT eat food after midnight Wenesday  Do NOT drink clear liquids (including water) after 8 am Thursday, day of surgery  Take these medicines the morning of surgery with a SIP OF WATER: amlodipine, clonidine, isosorbide mononitride, metoprolol, k-dur and eye drops if needed  Brush your teeth on the day of surgery.  Stop herbal medications, vitamin supplements, Ibuprofen/NSAIDS at this time.  Continue taking K-Dur and Iron but not on day of surgery.  Do NOT wear jewelry (body piercing), metal hair clips/bobby pins, make-up, or nail polish. Do NOT wear lotions, powders, or perfumes.  You may wear deoderant. Do NOT shave for 48 hours prior to surgery. Do NOT bring valuables to the hospital. Dentures, or bridgework may not be worn into surgery.  Have a responsible adult drive you home and stay with you for 24 hours after your procedure.  Home with Daughter Brenda Alexander cell [phone]

## 2018-04-28 ENCOUNTER — Other Ambulatory Visit: Payer: Self-pay

## 2018-04-28 ENCOUNTER — Encounter (HOSPITAL_COMMUNITY)
Admission: RE | Admit: 2018-04-28 | Discharge: 2018-04-28 | Disposition: A | Discharge status: Home / Self Care | Payer: Medicare HMO | Source: Ambulatory Visit | Attending: Obstetrics & Gynecology | Admitting: Obstetrics & Gynecology

## 2018-04-28 ENCOUNTER — Encounter (HOSPITAL_COMMUNITY): Payer: Self-pay

## 2018-04-28 DIAGNOSIS — I10 Essential (primary) hypertension: Secondary | ICD-10-CM | POA: Insufficient documentation

## 2018-04-28 DIAGNOSIS — R9389 Abnormal findings on diagnostic imaging of other specified body structures: Secondary | ICD-10-CM | POA: Diagnosis not present

## 2018-04-28 DIAGNOSIS — I2510 Atherosclerotic heart disease of native coronary artery without angina pectoris: Secondary | ICD-10-CM | POA: Diagnosis not present

## 2018-04-28 DIAGNOSIS — Z01812 Encounter for preprocedural laboratory examination: Secondary | ICD-10-CM | POA: Diagnosis not present

## 2018-04-28 DIAGNOSIS — N950 Postmenopausal bleeding: Secondary | ICD-10-CM | POA: Diagnosis not present

## 2018-04-28 DIAGNOSIS — E119 Type 2 diabetes mellitus without complications: Secondary | ICD-10-CM | POA: Diagnosis not present

## 2018-04-28 DIAGNOSIS — E785 Hyperlipidemia, unspecified: Secondary | ICD-10-CM | POA: Insufficient documentation

## 2018-04-28 HISTORY — DX: Personal history of other medical treatment: Z92.89

## 2018-04-28 HISTORY — DX: Unspecified osteoarthritis, unspecified site: M19.90

## 2018-04-28 HISTORY — DX: Anemia, unspecified: D64.9

## 2018-04-28 HISTORY — DX: Presence of dental prosthetic device (complete) (partial): Z97.2

## 2018-04-28 HISTORY — DX: Dyspnea, unspecified: R06.00

## 2018-04-28 HISTORY — DX: Dependence on other enabling machines and devices: Z99.89

## 2018-04-28 LAB — BASIC METABOLIC PANEL
Anion gap: 10 (ref 5–15)
BUN: 11 mg/dL (ref 8–23)
CO2: 28 mmol/L (ref 22–32)
Calcium: 9 mg/dL (ref 8.9–10.3)
Chloride: 97 mmol/L — ABNORMAL LOW (ref 98–111)
Creatinine, Ser: 0.84 mg/dL (ref 0.44–1.00)
GFR calc Af Amer: >60 mL/min (ref >60)
GFR calc non Af Amer: >60 mL/min (ref >60)
Glucose, Bld: 112 mg/dL — ABNORMAL HIGH (ref 70–99)
Potassium: 3.9 mmol/L (ref 3.5–5.1)
Sodium: 135 mmol/L (ref 135–145)

## 2018-04-28 LAB — CBC
HCT: 30.6 % — ABNORMAL LOW (ref 36.0–46.0)
Hemoglobin: 9.3 g/dL — ABNORMAL LOW (ref 12.0–15.0)
MCH: 27.2 pg (ref 26.0–34.0)
MCHC: 30.4 g/dL (ref 30.0–36.0)
MCV: 89.5 fL (ref 78.0–100.0)
Platelets: 257 10*3/uL (ref 150–400)
RBC: 3.42 MIL/uL — ABNORMAL LOW (ref 3.87–5.11)
RDW: 15.9 % — ABNORMAL HIGH (ref 11.5–15.5)
WBC: 4.2 10*3/uL (ref 4.0–10.5)

## 2018-04-28 NOTE — Pre-Procedure Instructions (Signed)
SDS BB History Log given to lab for patient's previous blood transfusion at [facility] on 01/12/18.

## 2018-05-06 ENCOUNTER — Encounter (HOSPITAL_COMMUNITY): Payer: Self-pay | Admitting: Emergency Medicine

## 2018-05-06 ENCOUNTER — Encounter (HOSPITAL_COMMUNITY)
Admission: RE | Disposition: A | Discharge status: Home / Self Care | Payer: Self-pay | Source: Ambulatory Visit | Attending: Obstetrics & Gynecology

## 2018-05-06 ENCOUNTER — Ambulatory Visit (HOSPITAL_COMMUNITY): Payer: Medicare HMO | Admitting: Certified Registered Nurse Anesthetist

## 2018-05-06 ENCOUNTER — Ambulatory Visit (HOSPITAL_COMMUNITY)
Admission: RE | Admit: 2018-05-06 | Discharge: 2018-05-06 | Disposition: A | Discharge status: Home / Self Care | Payer: Medicare HMO | Source: Ambulatory Visit | Attending: Obstetrics & Gynecology | Admitting: Obstetrics & Gynecology

## 2018-05-06 DIAGNOSIS — I2510 Atherosclerotic heart disease of native coronary artery without angina pectoris: Secondary | ICD-10-CM | POA: Insufficient documentation

## 2018-05-06 DIAGNOSIS — M1990 Unspecified osteoarthritis, unspecified site: Secondary | ICD-10-CM | POA: Diagnosis not present

## 2018-05-06 DIAGNOSIS — C541 Malignant neoplasm of endometrium: Secondary | ICD-10-CM | POA: Diagnosis not present

## 2018-05-06 DIAGNOSIS — R9389 Abnormal findings on diagnostic imaging of other specified body structures: Secondary | ICD-10-CM | POA: Insufficient documentation

## 2018-05-06 DIAGNOSIS — M2550 Pain in unspecified joint: Secondary | ICD-10-CM | POA: Diagnosis not present

## 2018-05-06 DIAGNOSIS — E119 Type 2 diabetes mellitus without complications: Secondary | ICD-10-CM | POA: Insufficient documentation

## 2018-05-06 DIAGNOSIS — D649 Anemia, unspecified: Secondary | ICD-10-CM | POA: Diagnosis not present

## 2018-05-06 DIAGNOSIS — N840 Polyp of corpus uteri: Secondary | ICD-10-CM | POA: Diagnosis not present

## 2018-05-06 DIAGNOSIS — E785 Hyperlipidemia, unspecified: Secondary | ICD-10-CM | POA: Insufficient documentation

## 2018-05-06 DIAGNOSIS — I10 Essential (primary) hypertension: Secondary | ICD-10-CM | POA: Diagnosis not present

## 2018-05-06 DIAGNOSIS — N950 Postmenopausal bleeding: Secondary | ICD-10-CM

## 2018-05-06 HISTORY — PX: HYSTEROSCOPY W/D&C: SHX1775

## 2018-05-06 LAB — GLUCOSE, CAPILLARY: Glucose-Capillary: 95 mg/dL (ref 70–99)

## 2018-05-06 SURGERY — DILATATION AND CURETTAGE /HYSTEROSCOPY
Anesthesia: Choice

## 2018-05-06 MED ORDER — LIDOCAINE HCL (CARDIAC) PF 100 MG/5ML IV SOSY
PREFILLED_SYRINGE | INTRAVENOUS | Status: DC | PRN
  Administered 2018-05-06: 50 mg via INTRAVENOUS

## 2018-05-06 MED ORDER — ONDANSETRON HCL 4 MG/2ML IJ SOLN
INTRAMUSCULAR | Status: AC
  Filled 2018-05-06: qty 2

## 2018-05-06 MED ORDER — DOCUSATE SODIUM 100 MG PO CAPS: 100.0000 mg | ORAL_CAPSULE | Freq: Two times a day (BID) | ORAL | 2 refills | Status: DC | PRN

## 2018-05-06 MED ORDER — FENTANYL CITRATE (PF) 100 MCG/2ML IJ SOLN
INTRAMUSCULAR | Status: DC | PRN
  Administered 2018-05-06 (×2): 25 ug via INTRAVENOUS

## 2018-05-06 MED ORDER — ONDANSETRON HCL 4 MG/2ML IJ SOLN
INTRAMUSCULAR | Status: DC | PRN
  Administered 2018-05-06: 4 mg via INTRAVENOUS

## 2018-05-06 MED ORDER — DEXAMETHASONE SODIUM PHOSPHATE 4 MG/ML IJ SOLN
INTRAMUSCULAR | Status: AC
  Filled 2018-05-06: qty 1

## 2018-05-06 MED ORDER — KETOROLAC TROMETHAMINE 15 MG/ML IJ SOLN
INTRAMUSCULAR | Status: DC | PRN
  Administered 2018-05-06: 15 mg via INTRAVENOUS

## 2018-05-06 MED ORDER — SODIUM CHLORIDE 0.9 % IR SOLN
Status: DC | PRN
  Administered 2018-05-06: 3000 mL

## 2018-05-06 MED ORDER — PROPOFOL 10 MG/ML IV BOLUS
INTRAVENOUS | Status: AC
  Filled 2018-05-06: qty 20

## 2018-05-06 MED ORDER — HYDROCODONE-ACETAMINOPHEN 5-325 MG PO TABS: 1.0000 | ORAL_TABLET | Freq: Four times a day (QID) | ORAL | 0 refills | Status: DC | PRN

## 2018-05-06 MED ORDER — KETOROLAC TROMETHAMINE 30 MG/ML IJ SOLN
INTRAMUSCULAR | Status: AC
  Filled 2018-05-06: qty 1

## 2018-05-06 MED ORDER — BUPIVACAINE HCL (PF) 0.5 % IJ SOLN
INTRAMUSCULAR | Status: AC
  Filled 2018-05-06: qty 30

## 2018-05-06 MED ORDER — PHENYLEPHRINE HCL 10 MG/ML IJ SOLN
INTRAMUSCULAR | Status: DC | PRN
  Administered 2018-05-06 (×3): .08 mg via INTRAVENOUS

## 2018-05-06 MED ORDER — PHENYLEPHRINE 40 MCG/ML (10ML) SYRINGE FOR IV PUSH (FOR BLOOD PRESSURE SUPPORT)
PREFILLED_SYRINGE | INTRAVENOUS | Status: AC
  Filled 2018-05-06: qty 10

## 2018-05-06 MED ORDER — DEXAMETHASONE SODIUM PHOSPHATE 10 MG/ML IJ SOLN
INTRAMUSCULAR | Status: DC | PRN
  Administered 2018-05-06: 4 mg via INTRAVENOUS

## 2018-05-06 MED ORDER — LIDOCAINE HCL (CARDIAC) PF 100 MG/5ML IV SOSY
PREFILLED_SYRINGE | INTRAVENOUS | Status: AC
  Filled 2018-05-06: qty 5

## 2018-05-06 MED ORDER — BUPIVACAINE HCL 0.5 % IJ SOLN
INTRAMUSCULAR | Status: DC | PRN
  Administered 2018-05-06: 30 mL

## 2018-05-06 MED ORDER — LACTATED RINGERS IV SOLN
INTRAVENOUS | Status: DC
  Administered 2018-05-06: 13:00:00 via INTRAVENOUS

## 2018-05-06 MED ORDER — PROPOFOL 500 MG/50ML IV EMUL
INTRAVENOUS | Status: DC | PRN
  Administered 2018-05-06: 80 mg via INTRAVENOUS

## 2018-05-06 MED ORDER — FENTANYL CITRATE (PF) 100 MCG/2ML IJ SOLN
INTRAMUSCULAR | Status: AC
  Filled 2018-05-06: qty 2

## 2018-05-06 SURGICAL SUPPLY — 13 items
BIPOLAR CUTTING LOOP 21FR (ELECTRODE)
CANISTER SUCT 3000ML PPV (MISCELLANEOUS) ×2 IMPLANT
CATH ROBINSON RED A/P 16FR (CATHETERS) ×2 IMPLANT
GLOVE BIOGEL PI IND STRL 7.0 (GLOVE) ×1 IMPLANT
GLOVE BIOGEL PI INDICATOR 7.0 (GLOVE) ×1
GLOVE ECLIPSE 7.0 STRL STRAW (GLOVE) ×2 IMPLANT
GOWN STRL REUS W/TWL LRG LVL3 (GOWN DISPOSABLE) ×4 IMPLANT
LOOP CUTTING BIPOLAR 21FR (ELECTRODE) IMPLANT
PACK VAGINAL MINOR WOMEN LF (CUSTOM PROCEDURE TRAY) ×2 IMPLANT
PAD OB MATERNITY 4.3X12.25 (PERSONAL CARE ITEMS) ×2 IMPLANT
TOWEL OR 17X24 6PK STRL BLUE (TOWEL DISPOSABLE) ×4 IMPLANT
TUBING AQUILEX INFLOW (TUBING) ×2 IMPLANT
TUBING AQUILEX OUTFLOW (TUBING) ×2 IMPLANT

## 2018-05-06 NOTE — Discharge Instructions (Addendum)
Hysteroscopy, Care After Refer to this sheet in the next few weeks. These instructions provide you with information on caring for yourself after your procedure. Your health care provider may also give you more specific instructions. Your treatment has been planned according to current medical practices, but problems sometimes occur. Call your health care provider if you have any problems or questions after your procedure. What can I expect after the procedure? After your procedure, it is typical to have the following:  You may have some cramping. This normally lasts for a couple days.  You may have bleeding. This can vary from light spotting for a few days to menstrual-like bleeding for 3-7 days.  Follow these instructions at home:  Rest for the first 1-2 days after the procedure.  Only take over-the-counter or prescription medicines as directed by your health care provider. Do not take aspirin. It can increase the chances of bleeding.  Take showers instead of baths for 2 weeks or as directed by your health care provider.  Do not drive for 24 hours or as directed.  Do not drink alcohol while taking pain medicine.  Do not use tampons, douche, or have sexual intercourse for 2 weeks or until your health care provider says it is okay.  Take your temperature twice a day for 4-5 days. Write it down each time.  Follow your health care provider's advice about diet, exercise, and lifting.  If you develop constipation, you may: ? Take a mild laxative if your health care provider approves. ? Add bran foods to your diet. ? Drink enough fluids to keep your urine clear or pale yellow.  Try to have someone with you or available to you for the first 24-48 hours, especially if you were given a general anesthetic.  Follow up with your health care provider as directed. Contact a health care provider if:  You feel dizzy or lightheaded.  You feel sick to your stomach (nauseous).  You have  abnormal vaginal discharge.  You have a rash.  You have pain that is not controlled with medicine. Get help right away if:  You have bleeding that is heavier than a normal menstrual period.  You have a fever.  You have increasing cramps or pain, not controlled with medicine.  You have new belly (abdominal) pain.  You pass out.  You have pain in the tops of your shoulders (shoulder strap areas).  You have shortness of breath. This information is not intended to replace advice given to you by your health care provider. Make sure you discuss any questions you have with your health care provider. Document Released: 05/04/2013 Document Revised: 12/20/2015 Document Reviewed: 02/10/2013   Post Anesthesia Home Care Instructions  Activity: Get plenty of rest for the remainder of the day. A responsible individual must stay with you for 24 hours following the procedure.  For the next 24 hours, DO NOT: -Drive a car -Paediatric nurse -Drink alcoholic beverages -Take any medication unless instructed by your physician -Make any legal decisions or sign important papers.  Meals: Start with liquid foods such as gelatin or soup. Progress to regular foods as tolerated. Avoid greasy, spicy, heavy foods. If nausea and/or vomiting occur, drink only clear liquids until the nausea and/or vomiting subsides. Call your physician if vomiting continues.  Special Instructions/Symptoms: Your throat may feel dry or sore from the anesthesia or the breathing tube placed in your throat during surgery. If this causes discomfort, gargle with warm salt water. The discomfort should disappear  within 24 hours.   Marland Kitchen

## 2018-05-06 NOTE — Transfer of Care (Signed)
Immediate Anesthesia Transfer of Care Note  Patient: Brenda Alexander  Procedure(s) Performed: DILATATION AND CURETTAGE /HYSTEROSCOPY (N/A )  Patient Location: PACU  Anesthesia Type:General  Level of Consciousness: awake, alert  and oriented  Airway & Oxygen Therapy: Patient Spontanous Breathing and Patient connected to nasal cannula oxygen  Post-op Assessment: Report given to RN and Post -op Vital signs reviewed and stable  Post vital signs: Reviewed and stable  Last Vitals:  Vitals Value Taken Time  BP 127/66 05/06/2018  2:25 PM  Temp    Pulse 61 05/06/2018  2:28 PM  Resp 15 05/06/2018  2:28 PM  SpO2 100 % 05/06/2018  2:28 PM  Vitals shown include unvalidated device data.  Last Pain:  Vitals:   05/06/18 1224  TempSrc: Oral      Patients Stated Pain Goal: 3 (15/94/58 5929)  Complications: No apparent anesthesia complications

## 2018-05-06 NOTE — Anesthesia Procedure Notes (Signed)
Procedure Name: LMA Insertion Date/Time: 05/06/2018 1:37 PM Performed by: Bufford Spikes, CRNA Pre-anesthesia Checklist: Patient identified, Emergency Drugs available, Suction available and Patient being monitored Patient Re-evaluated:Patient Re-evaluated prior to induction Oxygen Delivery Method: Circle system utilized Preoxygenation: Pre-oxygenation with 100% oxygen Induction Type: IV induction Ventilation: Mask ventilation without difficulty LMA: LMA inserted LMA Size: 4.0 Number of attempts: 1 Airway Equipment and Method: Bite block Placement Confirmation: positive ETCO2 Tube secured with: Tape Dental Injury: Teeth and Oropharynx as per pre-operative assessment

## 2018-05-06 NOTE — Interval H&P Note (Signed)
History and Physical Interval Note 05/06/2018 1:10 PM  Brenda Alexander  has presented today for surgery, with the diagnosis of PMB  The various methods of treatment have been discussed with the patient and family. After consideration of risks, benefits and other options for treatment, the patient has consented to [place] /HYSTEROSCOPY as a surgical intervention .  The patient's history has been reviewed, patient examined, no change in status, stable for surgery.  I have reviewed the patient's chart and labs.  Questions were answered to the patient's satisfaction.  To OR when ready.  Verita Schneiders, MD, [place] for [employer] [facility], [place]

## 2018-05-06 NOTE — Anesthesia Preprocedure Evaluation (Signed)
Anesthesia Evaluation  Patient identified by MRN, date of birth, ID band Patient awake    Reviewed: Allergy & Precautions, NPO status , Patient's Chart, lab work & pertinent test results, reviewed documented beta blocker date and time   History of Anesthesia Complications Negative for: history of anesthetic complications  Airway Mallampati: II  TM Distance: >3 FB Neck ROM: Full    Dental  (+) Partial Upper, Partial Lower   Pulmonary neg pulmonary ROS,    Pulmonary exam normal        Cardiovascular hypertension, Pt. on medications + CAD  Normal cardiovascular exam  Pt is unsure of how she was diagnosed with CAD - she denies any sxs of CP or DOE. No stress test or LHC to indicate diagnosis of CAD in our records. She has a normal EKG from 12/2017. No hx of PCI or CABG. Activity is limited by joint pain and not dyspnea.    Neuro/Psych negative neurological ROS  negative psych ROS   GI/Hepatic negative GI ROS, Neg liver ROS,   Endo/Other  diabetes  Renal/GU negative Renal ROS  negative genitourinary   Musculoskeletal  (+) Arthritis ,   Abdominal   Peds  Hematology  (+) anemia ,   Anesthesia Other Findings   Reproductive/Obstetrics                            Anesthesia Physical Anesthesia Plan  ASA: III  Anesthesia Plan: General   Post-op Pain Management:    Induction:   PONV Risk Score and Plan: 4 or greater and Ondansetron, Dexamethasone and Treatment may vary due to age or medical condition  Airway Management Planned: LMA  Additional Equipment:   Intra-op Plan:   Post-operative Plan: Extubation in OR  Informed Consent: I have reviewed the patients History and Physical, chart, labs and discussed the procedure including the risks, benefits and alternatives for the proposed anesthesia with the patient or authorized representative who has indicated his/her understanding and  acceptance.     Plan Discussed with:   Anesthesia Plan Comments:         Anesthesia Quick Evaluation

## 2018-05-06 NOTE — Anesthesia Postprocedure Evaluation (Signed)
Anesthesia Post Note  Patient: Brenda Alexander  Procedure(s) Performed: DILATATION AND CURETTAGE /HYSTEROSCOPY (N/A )     Patient location during evaluation: PACU Anesthesia Type: General Level of consciousness: awake and alert Pain management: pain level controlled Vital Signs Assessment: post-procedure vital signs reviewed and stable Respiratory status: spontaneous breathing, nonlabored ventilation and respiratory function stable Cardiovascular status: blood pressure returned to baseline and stable Postop Assessment: no apparent nausea or vomiting Anesthetic complications: no    Last Vitals:  Vitals:   05/06/18 1430 05/06/18 1445  BP: 135/70 (!) 142/72  Pulse: (!) 59 (!) 55  Resp: 18 13  Temp:    SpO2: 100% 100%    Last Pain:  Vitals:   05/06/18 1445  TempSrc:   PainSc: Asleep   Pain Goal: Patients Stated Pain Goal: 3 (05/06/18 1445)               Lidia Collum

## 2018-05-06 NOTE — Op Note (Signed)
PREOPERATIVE DIAGNOSES:  Postmenopausal bleeding, thickened endometrial stripe POSTOPERATIVE DIAGNOSES: The same PROCEDURE: Hysteroscopy, Dilation and Curettage. SURGEON:  Dr. Verita Schneiders  INDICATIONS: 84 y.o. [id number]  here for scheduled surgery for the aforementioned diagnoses.   Risks of surgery were discussed with the patient including but not limited to: bleeding which may require transfusion; infection which may require antibiotics; injury to uterus or surrounding organs; intrauterine scarring which may impair future fertility; need for additional procedures including laparotomy or laparoscopy; and other postoperative/anesthesia complications. Written informed consent was obtained.    FINDINGS:  A 9 week size uterus.  Diffuse proliferative and polypoid endometrium.  Unable to visualize ostia bilaterally due to proliferative tissue.  ANESTHESIA:   General, paracervical block with 30 ml of 0.5% Marcaine ESTIMATED BLOOD LOSS:  50 ml SPECIMENS: Endometrial curettings sent to pathology COMPLICATIONS:  None immediate.  PROCEDURE DETAILS:  The patient was then taken to the operating room where general anesthesia was administered and was found to be adequate.  After an adequate timeout was performed, she was placed in the dorsal lithotomy position and examined; then prepped and draped in the sterile manner.   Her bladder was catheterized for an unmeasured amount of clear, yellow urine. A speculum was then placed in the patient's vagina and a single tooth tenaculum was applied to the anterior lip of the cervix.   A paracervical block using 30 ml of 0.5% Marcaine was administered.  The uterus was sounded to 9 cm and the cervix was noted to be patent enough to accommodate the 5 mm diagnostic hysteroscope.  The hysteroscope was then inserted under direct visualization using NS as a suspension medium.  The uterine cavity was carefully examined with the findings as noted above.   After further careful  visualization of the uterine cavity, the hysteroscope was removed under direct visualization.  A sharp curettage was then performed to obtain a moderate amount of endometrial curettings.  The tenaculum was removed from the anterior lip of the cervix and the vaginal speculum was removed after noting good hemostasis.  The patient tolerated the procedure well and was taken to the recovery area awake, extubated and in stable condition.  The patient will be discharged to home as per PACU criteria.  Routine postoperative instructions given.  She was prescribed Vicodin and Colace.  She will follow up in the office on 05/13/2018 for postoperative evaluation and for discussion of results.    Verita Schneiders, MD, [place] for [employer] [facility] [employer], [place]

## 2018-05-07 ENCOUNTER — Encounter (HOSPITAL_COMMUNITY): Payer: Self-pay | Admitting: Obstetrics & Gynecology

## 2018-05-07 LAB — GLUCOSE, CAPILLARY: Glucose-Capillary: 97 mg/dL (ref 70–99)

## 2018-05-11 ENCOUNTER — Telehealth: Payer: Self-pay | Admitting: *Deleted

## 2018-05-11 NOTE — Telephone Encounter (Signed)
Called yesterday and spoke with the nurse at Dr. Harolyn Rutherford office. Gave appt for 10/21 at 11:30am. Will add appt after she see Dr. Harolyn Rutherford on 10/17

## 2018-05-13 ENCOUNTER — Telehealth: Payer: Self-pay | Admitting: *Deleted

## 2018-05-13 ENCOUNTER — Ambulatory Visit (INDEPENDENT_AMBULATORY_CARE_PROVIDER_SITE_OTHER): Payer: Medicare HMO | Admitting: Obstetrics & Gynecology

## 2018-05-13 VITALS — BP 131/67 | HR 80

## 2018-05-13 DIAGNOSIS — C55 Malignant neoplasm of uterus, part unspecified: Secondary | ICD-10-CM | POA: Diagnosis not present

## 2018-05-13 DIAGNOSIS — Z1231 Encounter for screening mammogram for malignant neoplasm of breast: Secondary | ICD-10-CM | POA: Diagnosis not present

## 2018-05-13 HISTORY — DX: Malignant neoplasm of uterus, part unspecified: C55

## 2018-05-13 NOTE — Progress Notes (Signed)
   GYNECOLOGY OFFICE VISIT NOTE  History:  84 y.o. [id number] here today for follow up after recent Hysteroscopy and D&C for PMB, and to discuss pathology results.  She is accompanied by her daughter. She reports scant bleeding and mild pain post procedure, but nothing uncontrolled or severe.  No other significant postperative concerns.  Past Medical History:  Diagnosis Date  . Anemia   . Arthritis    knees, hands  . Coronary artery disease   . Diabetes mellitus without complication (HCC)    no meds  . Dyspnea 01/12/2018   w/anemia dx and hospital admit w/blood transfused   . History of blood transfusion 01/12/2018   at [facility]  . Hyperlipemia   . Hypertension   . SVD (spontaneous vaginal delivery)    x 6 - 5 living children  . Uses walker    rollator  . Wears partial dentures    upper and lower partials    Past Surgical History:  Procedure Laterality Date  . COLONOSCOPY    . EYE SURGERY     cataracts removed right eye  . HYSTEROSCOPY W/D&C N/A 05/06/2018   Procedure: DILATATION AND CURETTAGE /HYSTEROSCOPY;  Surgeon: [name], MD;  Location: [facility];  Service: Gynecology;  Laterality: N/A;  . MULTIPLE TOOTH EXTRACTIONS      The following portions of the patient's history were reviewed and updated as appropriate: allergies, current medications, past family history, past medical history, past social history, past surgical history and problem list.   Health Maintenance:  Normal pap and negative HRHPV on 01/21/2018.  Normal mammogram on 08/25/2016.   Review of Systems:  Pertinent items noted in HPI and remainder of comprehensive ROS otherwise negative.  Objective:  Physical Exam BP 131/67   Pulse 80   LMP  (LMP Unknown)  CONSTITUTIONAL: Well-developed, well-nourished female in no acute distress.  MUSCULOSKELETAL: Normal range of motion. No edema noted. NEUROLOGIC: Alert and oriented to person, place, and time. Normal muscle tone coordination. No cranial nerve  deficit noted. PSYCHIATRIC: Normal mood and affect. Normal behavior. Normal judgment and thought content. CARDIOVASCULAR: Normal heart rate noted RESPIRATORY: Effort and breath sounds normal, no problems with respiration noted ABDOMEN: Soft, no distention noted.   PELVIC: Deferred  Pathology Results 05/06/2018 Endometrium, curettage - ENDOMETRIOID ADENOCARCINOMA, FIGO GRADE I.  Assessment & Plan:  Endometrioid adenocarcinoma of uterus [facility]) Results were discussed with patient and her daughter and appropriate support was given to them.  Answered all of their questions but deferred questions about management to her oncologist.  She was informed of the appointment with GYN Oncology on 10/21 at 11:30 am; she needs to get there at 11 am. Patient was emotional after the encounter, but the daughter was very, very distraught.  With the patient's permission and the daughter's permission, a call was placed to the daughter's work supervisor asking for the daughter to be excused from work that was scheduled for this evening, and for the daughter to be with her mother.  Patient was told to call for any other concerns. Of note, patient was also ordered for mammogram for routine breast cancer screening.  Total face-to-face time with patient: 25 minutes.  Over 50% of encounter was spent on counseling and coordination of care.   Verita Schneiders, MD, [place] for [employer] [facility], [place]

## 2018-05-13 NOTE — Telephone Encounter (Signed)
Dr. Harolyn Rutherford called and stated that we can schedule appt for the patient on 10/21

## 2018-05-14 ENCOUNTER — Telehealth: Payer: Self-pay | Admitting: Obstetrics & Gynecology

## 2018-05-14 ENCOUNTER — Encounter: Payer: Self-pay | Admitting: Obstetrics & Gynecology

## 2018-05-14 DIAGNOSIS — C55 Malignant neoplasm of uterus, part unspecified: Secondary | ICD-10-CM

## 2018-05-14 NOTE — Telephone Encounter (Signed)
Faculty Practice OB/GYN Physician Phone Call Documentation  I received a placed call to [place] to check on her and her family after yesterday's visit. Left a voicemail expressing my support for them at this time and also advised them to call for any questions/concerns prior to the upcoming appointment.  Verita Schneiders, MD, [place] for [employer], [place]

## 2018-05-17 ENCOUNTER — Inpatient Hospital Stay: Payer: Medicare HMO | Attending: Gynecologic Oncology | Admitting: Gynecologic Oncology

## 2018-05-17 ENCOUNTER — Telehealth: Payer: Self-pay

## 2018-05-17 ENCOUNTER — Encounter: Payer: Self-pay | Admitting: Gynecologic Oncology

## 2018-05-17 VITALS — BP 138/80 | HR 82 | Temp 98.3°F | Resp 18 | Ht 59.0 in | Wt 189.0 lb

## 2018-05-17 DIAGNOSIS — C541 Malignant neoplasm of endometrium: Secondary | ICD-10-CM | POA: Diagnosis present

## 2018-05-17 DIAGNOSIS — C55 Malignant neoplasm of uterus, part unspecified: Secondary | ICD-10-CM

## 2018-05-17 NOTE — H&P (View-Only) (Signed)
Consult Note: Gyn-Onc  Consult was requested by Dr. Harolyn Rutherford for the evaluation of Brenda Alexander 84 y.o. female  CC:  Chief Complaint  Patient presents with  . Endometrioid adenocarcinoma of uterus [facility])    Assessment/Plan:  Brenda Alexander  is a 84 y.o.  year old with grade 1 endometrial adenocarcinoma.   A detailed discussion was held with the patient and her family with regard to to her endometrial cancer diagnosis. We discussed the standard management options for uterine cancer which includes surgery followed possibly by adjuvant therapy depending on the results of surgery. The options for surgical management include a hysterectomy and removal of the tubes and ovaries possibly with removal of pelvic and para-aortic lymph nodes.If feasible, a minimally invasive approach including a robotic hysterectomy or laparoscopic hysterectomy have benefits including shorter hospital stay, recovery time and better wound healing than with open surgery. The patient has been counseled about these surgical options and the risks of surgery in general including infection, bleeding, damage to surrounding structures (including bowel, bladder, ureters, nerves or vessels), and the postoperative risks of PE/ DVT, and lymphedema. I extensively reviewed the additional risks of robotic hysterectomy including possible need for conversion to open laparotomy.  I discussed positioning during surgery of trendelenberg and risks of minor facial swelling and care we take in preoperative positioning.  After counseling and consideration of her options, she desires to proceed with robotic assisted total hysterectomy with bilateral sapingo-oophorectomy and SLN biopsy.   I discussed with the patient's family that given her underlying age and comorbidities she is at increased risk for surgical complications.  However I feel that this is the best approach for her.  An alternative of hormonal therapy was offered, however given the absence of  contraindication to surgery and the low rate of success and complete reversal of endometrial cancer with progestin therapy if feel is reasonable to proceed with surgery.  She will be seen by anesthesia for preoperative clearance and discussion of postoperative pain management.  She was given the opportunity to ask questions, which were answered to her satisfaction, and she is agreement with the above mentioned plan of care.    HPI: Ms Brenda Alexander is a 84 year old P5 who is seen in consultation at the request of Dr Harolyn Rutherford for grade 1 endometrial cancer.  Patient reports light postmenopausal bleeding since the summer 2019.  Initially it was felt that this was hematuria and she was seen with a CT scan on January 12, 2018 which revealed a market enlargement of the endometrial canal with a 3.2 cm thickness.  There were no adnexal masses.  A 2.0 x 3.4 cm focal area of the bladder wall thickening was identified on the CT scan.  She then saw Dr. Lovena Neighbours, urologist, who performed cystoscopy, which per patient was unremarkable and did not show any lesion within the bladder.  She was seen by her gynecologist, Dr.Anyanwu, who performed ultrasound imaging on January 21, 2018 which revealed a uterus measuring 9.6 x 5.1 x 6.4 cm with no fibroids or other masses.  The endometrium was very thick at 3.1 cm.  Had a heterogeneous appearance.  The ovaries bilaterally were normal.  Of note a Pap smear was normal with negative high-risk HPV in June or 2019.  Ca1 25 was drawn in June 2019 which was also normal 16.  And CEA was normal at 1.3.  The patient underwent a D&C procedure on May 06, 2018 with endometrial pathology revealing FIGO grade 1 endometrial  adenocarcinoma.  The patient is somewhat frail and uses a walker for reasons of arthritis.  However she does not have many discrete medical comorbidities.  She carries a diagnosis in her chart of coronary artery disease, however the patient has no chest pain on exertion or  cardiac symptoms.  She has no history of a myocardial infarction or angina.  She is never had a coronary perfusion study or catheterization procedure.  She is never had a CVA or other sequelae of peripheral vascular disease.  Patient has history of diabetes mellitus for which she does not take medication as is no longer needed.  She does have hyperlipidemia and hypertension.  She has had 5 prior vaginal deliveries.  She has had no prior abdominal surgeries.  Current Meds:  Outpatient Encounter Medications as of 05/17/2018  Medication Sig  . acetaminophen (TYLENOL) 500 MG tablet Take 1,000 mg by mouth every 6 (six) hours as needed (for pain).   Marland Kitchen amLODipine (NORVASC) 5 MG tablet Take 5 mg by mouth daily.  . Calcium Carb-Cholecalciferol (CALCIUM + D3 PO) Take 1 tablet by mouth daily.  . cloNIDine (CATAPRES) 0.2 MG tablet Take 0.2 mg by mouth 2 (two) times daily.  . cycloSPORINE (RESTASIS) 0.05 % ophthalmic emulsion Place 1 drop into both eyes 2 (two) times daily.  Marland Kitchen docusate sodium (COLACE) 100 MG capsule Take 1 capsule (100 mg total) by mouth 2 (two) times daily as needed for mild constipation or moderate constipation.  . enalapril (VASOTEC) 20 MG tablet Take 20 mg by mouth 2 (two) times daily.  . ferrous sulfate 325 (65 FE) MG EC tablet Take 1 tablet (325 mg total) by mouth 2 (two) times daily.  . furosemide (LASIX) 40 MG tablet Take 40 mg by mouth daily.  . isosorbide mononitrate (ISMO,MONOKET) 20 MG tablet Take 20 mg by mouth daily.  . metoprolol succinate (TOPROL-XL) 50 MG 24 hr tablet Take 50 mg by mouth daily.  . potassium chloride SA (K-DUR,KLOR-CON) 20 MEQ tablet Take 20 mEq by mouth daily.  . simvastatin (ZOCOR) 20 MG tablet Take 20 mg by mouth at bedtime.   . vitamin C (ASCORBIC ACID) 500 MG tablet Take 500 mg by mouth daily.  Marland Kitchen HYDROcodone-acetaminophen (NORCO/VICODIN) 5-325 MG tablet Take 1-2 tablets by mouth every 6 (six) hours as needed for moderate pain or severe pain. (Patient not  taking: Reported on 05/17/2018)   No facility-administered encounter medications on file as of 05/17/2018.     Allergy:  Allergies  Allergen Reactions  . Other Anaphylaxis and Swelling    Walnuts = Throat swells  . Penicillins Swelling and Rash    Has patient had a PCN reaction causing immediate rash, facial/tongue/throat swelling, SOB or lightheadedness with hypotension: Yes Has patient had a PCN reaction causing severe rash involving mucus membranes or skin necrosis: Unk Has patient had a PCN reaction that required hospitalization: Unk Has patient had a PCN reaction occurring within the last 10 years: No If all of the above answers are "NO", then may proceed with Cephalosporin use.     Social Hx:   Social History   Socioeconomic History  . Marital status: Widowed    Spouse name: Not on file  . Number of children: Not on file  . Years of education: Not on file  . Highest education level: Not on file  Occupational History  . Not on file  Social Needs  . Financial resource strain: Not on file  . Food insecurity:    Worry:  Not on file    Inability: Not on file  . Transportation needs:    Medical: Not on file    Non-medical: Not on file  Tobacco Use  . Smoking status: Never Smoker  . Smokeless tobacco: Never Used  Substance and Sexual Activity  . Alcohol use: Never    Frequency: Never  . Drug use: Never  . Sexual activity: Not Currently    Partners: Male    Birth control/protection: Post-menopausal  Lifestyle  . Physical activity:    Days per week: Not on file    Minutes per session: Not on file  . Stress: Not on file  Relationships  . Social connections:    Talks on phone: Not on file    Gets together: Not on file    Attends religious service: Not on file    Active member of club or organization: Not on file    Attends meetings of clubs or organizations: Not on file    Relationship status: Not on file  . Intimate partner violence:    Fear of current or ex  partner: Not on file    Emotionally abused: Not on file    Physically abused: Not on file    Forced sexual activity: Not on file  Other Topics Concern  . Not on file  Social History Narrative  . Not on file    Past Surgical Hx:  Past Surgical History:  Procedure Laterality Date  . COLONOSCOPY    . EYE SURGERY     cataracts removed right eye  . HYSTEROSCOPY W/D&C N/A 05/06/2018   Procedure: DILATATION AND CURETTAGE /HYSTEROSCOPY;  Surgeon: [name], MD;  Location: [facility];  Service: Gynecology;  Laterality: N/A;  . MULTIPLE TOOTH EXTRACTIONS      Past Medical Hx:  Past Medical History:  Diagnosis Date  . Anemia   . Arthritis    knees, hands  . Coronary artery disease   . Diabetes mellitus without complication (HCC)    no meds  . Dyspnea 01/12/2018   w/anemia dx and hospital admit w/blood transfused   . Endometrioid adenocarcinoma of uterus ([place]) 05/13/2018  . History of blood transfusion 01/12/2018   at [facility]  . Hyperlipemia   . Hypertension   . SVD (spontaneous vaginal delivery)    x 6 - 5 living children  . Uses walker    rollator  . Wears partial dentures    upper and lower partials    Past Gynecological History:  See HPI  No LMP recorded (lmp unknown). Patient is postmenopausal.  Family Hx:  Family History  Problem Relation Age of Onset  . Obesity Son   . Hypertension Father   . Stroke Father     Review of Systems:  Constitutional  Feels well,    ENT Normal appearing ears and nares bilaterally Skin/Breast  No rash, sores, jaundice, itching, dryness Cardiovascular  No chest pain, shortness of breath, or edema  Pulmonary  No cough or wheeze.  Gastro Intestinal  No nausea, vomitting, or diarrhoea. No bright red blood per rectum, no abdominal pain, change in bowel movement, or constipation.  Genito Urinary  No frequency, urgency, dysuria, + bleeding Musculo Skeletal  No myalgia, arthralgia, joint swelling or pain  Neurologic  No  weakness, numbness, change in gait,  Psychology  No depression, anxiety, insomnia.   Vitals:  Blood pressure 138/80, pulse 82, temperature 98.3 F (36.8 C), temperature source Oral, resp. rate 18, height 4\' 11"  (1.499 m), weight  189 lb (85.7 kg), SpO2 100 %.  Physical Exam: WD in NAD Neck  Supple NROM, without any enlargements.  Lymph Node Survey No cervical supraclavicular or inguinal adenopathy Cardiovascular  Pulse normal rate, regularity and rhythm. S1 and S2 normal.  Lungs  Clear to auscultation bilateraly, without wheezes/crackles/rhonchi. Good air movement.  Skin  No rash/lesions/breakdown  Psychiatry  Alert and oriented to person, place, and time  Abdomen  Normoactive bowel sounds, abdomen soft, non-tender and mildly obese without evidence of hernia.  Back No CVA tenderness Genito Urinary  Vulva/vagina: Normal external female genitalia.   No lesions. No discharge or bleeding.  Bladder/urethra:  No lesions or masses, well supported bladder  Vagina: normal  Cervix: Normal appearing, no lesions.  Uterus: slightly enlarged, mobile, no parametrial involvement or nodularity.  Adnexa: no discrete masses. Rectal  deferred Extremities  No bilateral cyanosis, clubbing or edema.   Thereasa Solo, MD  05/17/2018, 5:02 PM

## 2018-05-17 NOTE — Telephone Encounter (Signed)
Opened chart in error.

## 2018-05-17 NOTE — Progress Notes (Signed)
Consult Note: Gyn-Onc  Consult was requested by Dr. Harolyn Rutherford for the evaluation of Brenda Alexander 84 y.o. female  CC:  Chief Complaint  Patient presents with  . Endometrioid adenocarcinoma of uterus [facility])    Assessment/Plan:  Brenda Alexander  is a 84 y.o.  year old with grade 1 endometrial adenocarcinoma.   A detailed discussion was held with the patient and her family with regard to to her endometrial cancer diagnosis. We discussed the standard management options for uterine cancer which includes surgery followed possibly by adjuvant therapy depending on the results of surgery. The options for surgical management include a hysterectomy and removal of the tubes and ovaries possibly with removal of pelvic and para-aortic lymph nodes.If feasible, a minimally invasive approach including a robotic hysterectomy or laparoscopic hysterectomy have benefits including shorter hospital stay, recovery time and better wound healing than with open surgery. The patient has been counseled about these surgical options and the risks of surgery in general including infection, bleeding, damage to surrounding structures (including bowel, bladder, ureters, nerves or vessels), and the postoperative risks of PE/ DVT, and lymphedema. I extensively reviewed the additional risks of robotic hysterectomy including possible need for conversion to open laparotomy.  I discussed positioning during surgery of trendelenberg and risks of minor facial swelling and care we take in preoperative positioning.  After counseling and consideration of her options, she desires to proceed with robotic assisted total hysterectomy with bilateral sapingo-oophorectomy and SLN biopsy.   I discussed with the patient's family that given her underlying age and comorbidities she is at increased risk for surgical complications.  However I feel that this is the best approach for her.  An alternative of hormonal therapy was offered, however given the absence of  contraindication to surgery and the low rate of success and complete reversal of endometrial cancer with progestin therapy if feel is reasonable to proceed with surgery.  She will be seen by anesthesia for preoperative clearance and discussion of postoperative pain management.  She was given the opportunity to ask questions, which were answered to her satisfaction, and she is agreement with the above mentioned plan of care.    HPI: Ms Brenda Alexander is a 85 year old P5 who is seen in consultation at the request of Dr Harolyn Rutherford for grade 1 endometrial cancer.  Patient reports light postmenopausal bleeding since the summer 2019.  Initially it was felt that this was hematuria and she was seen with a CT scan on January 12, 2018 which revealed a market enlargement of the endometrial canal with a 3.2 cm thickness.  There were no adnexal masses.  A 2.0 x 3.4 cm focal area of the bladder wall thickening was identified on the CT scan.  She then saw Dr. Lovena Neighbours, urologist, who performed cystoscopy, which per patient was unremarkable and did not show any lesion within the bladder.  She was seen by her gynecologist, Dr.Anyanwu, who performed ultrasound imaging on January 21, 2018 which revealed a uterus measuring 9.6 x 5.1 x 6.4 cm with no fibroids or other masses.  The endometrium was very thick at 3.1 cm.  Had a heterogeneous appearance.  The ovaries bilaterally were normal.  Of note a Pap smear was normal with negative high-risk HPV in June or 2019.  Ca1 25 was drawn in June 2019 which was also normal 16.  And CEA was normal at 1.3.  The patient underwent a D&C procedure on May 06, 2018 with endometrial pathology revealing FIGO grade 1 endometrial  adenocarcinoma.  The patient is somewhat frail and uses a walker for reasons of arthritis.  However she does not have many discrete medical comorbidities.  She carries a diagnosis in her chart of coronary artery disease, however the patient has no chest pain on exertion or  cardiac symptoms.  She has no history of a myocardial infarction or angina.  She is never had a coronary perfusion study or catheterization procedure.  She is never had a CVA or other sequelae of peripheral vascular disease.  Patient has history of diabetes mellitus for which she does not take medication as is no longer needed.  She does have hyperlipidemia and hypertension.  She has had 5 prior vaginal deliveries.  She has had no prior abdominal surgeries.  Current Meds:  Outpatient Encounter Medications as of 05/17/2018  Medication Sig  . acetaminophen (TYLENOL) 500 MG tablet Take 1,000 mg by mouth every 6 (six) hours as needed (for pain).   Marland Kitchen amLODipine (NORVASC) 5 MG tablet Take 5 mg by mouth daily.  . Calcium Carb-Cholecalciferol (CALCIUM + D3 PO) Take 1 tablet by mouth daily.  . cloNIDine (CATAPRES) 0.2 MG tablet Take 0.2 mg by mouth 2 (two) times daily.  . cycloSPORINE (RESTASIS) 0.05 % ophthalmic emulsion Place 1 drop into both eyes 2 (two) times daily.  Marland Kitchen docusate sodium (COLACE) 100 MG capsule Take 1 capsule (100 mg total) by mouth 2 (two) times daily as needed for mild constipation or moderate constipation.  . enalapril (VASOTEC) 20 MG tablet Take 20 mg by mouth 2 (two) times daily.  . ferrous sulfate 325 (65 FE) MG EC tablet Take 1 tablet (325 mg total) by mouth 2 (two) times daily.  . furosemide (LASIX) 40 MG tablet Take 40 mg by mouth daily.  . isosorbide mononitrate (ISMO,MONOKET) 20 MG tablet Take 20 mg by mouth daily.  . metoprolol succinate (TOPROL-XL) 50 MG 24 hr tablet Take 50 mg by mouth daily.  . potassium chloride SA (K-DUR,KLOR-CON) 20 MEQ tablet Take 20 mEq by mouth daily.  . simvastatin (ZOCOR) 20 MG tablet Take 20 mg by mouth at bedtime.   . vitamin C (ASCORBIC ACID) 500 MG tablet Take 500 mg by mouth daily.  Marland Kitchen HYDROcodone-acetaminophen (NORCO/VICODIN) 5-325 MG tablet Take 1-2 tablets by mouth every 6 (six) hours as needed for moderate pain or severe pain. (Patient not  taking: Reported on 05/17/2018)   No facility-administered encounter medications on file as of 05/17/2018.     Allergy:  Allergies  Allergen Reactions  . Other Anaphylaxis and Swelling    Walnuts = Throat swells  . Penicillins Swelling and Rash    Has patient had a PCN reaction causing immediate rash, facial/tongue/throat swelling, SOB or lightheadedness with hypotension: Yes Has patient had a PCN reaction causing severe rash involving mucus membranes or skin necrosis: Unk Has patient had a PCN reaction that required hospitalization: Unk Has patient had a PCN reaction occurring within the last 10 years: No If all of the above answers are "NO", then may proceed with Cephalosporin use.     Social Hx:   Social History   Socioeconomic History  . Marital status: Widowed    Spouse name: Not on file  . Number of children: Not on file  . Years of education: Not on file  . Highest education level: Not on file  Occupational History  . Not on file  Social Needs  . Financial resource strain: Not on file  . Food insecurity:    Worry:  Not on file    Inability: Not on file  . Transportation needs:    Medical: Not on file    Non-medical: Not on file  Tobacco Use  . Smoking status: Never Smoker  . Smokeless tobacco: Never Used  Substance and Sexual Activity  . Alcohol use: Never    Frequency: Never  . Drug use: Never  . Sexual activity: Not Currently    Partners: Male    Birth control/protection: Post-menopausal  Lifestyle  . Physical activity:    Days per week: Not on file    Minutes per session: Not on file  . Stress: Not on file  Relationships  . Social connections:    Talks on phone: Not on file    Gets together: Not on file    Attends religious service: Not on file    Active member of club or organization: Not on file    Attends meetings of clubs or organizations: Not on file    Relationship status: Not on file  . Intimate partner violence:    Fear of current or ex  partner: Not on file    Emotionally abused: Not on file    Physically abused: Not on file    Forced sexual activity: Not on file  Other Topics Concern  . Not on file  Social History Narrative  . Not on file    Past Surgical Hx:  Past Surgical History:  Procedure Laterality Date  . COLONOSCOPY    . EYE SURGERY     cataracts removed right eye  . HYSTEROSCOPY W/D&C N/A 05/06/2018   Procedure: DILATATION AND CURETTAGE /HYSTEROSCOPY;  Surgeon: [name], MD;  Location: [facility];  Service: Gynecology;  Laterality: N/A;  . MULTIPLE TOOTH EXTRACTIONS      Past Medical Hx:  Past Medical History:  Diagnosis Date  . Anemia   . Arthritis    knees, hands  . Coronary artery disease   . Diabetes mellitus without complication (HCC)    no meds  . Dyspnea 01/12/2018   w/anemia dx and hospital admit w/blood transfused   . Endometrioid adenocarcinoma of uterus ([place]) 05/13/2018  . History of blood transfusion 01/12/2018   at [facility]  . Hyperlipemia   . Hypertension   . SVD (spontaneous vaginal delivery)    x 6 - 5 living children  . Uses walker    rollator  . Wears partial dentures    upper and lower partials    Past Gynecological History:  See HPI  No LMP recorded (lmp unknown). Patient is postmenopausal.  Family Hx:  Family History  Problem Relation Age of Onset  . Obesity Son   . Hypertension Father   . Stroke Father     Review of Systems:  Constitutional  Feels well,    ENT Normal appearing ears and nares bilaterally Skin/Breast  No rash, sores, jaundice, itching, dryness Cardiovascular  No chest pain, shortness of breath, or edema  Pulmonary  No cough or wheeze.  Gastro Intestinal  No nausea, vomitting, or diarrhoea. No bright red blood per rectum, no abdominal pain, change in bowel movement, or constipation.  Genito Urinary  No frequency, urgency, dysuria, + bleeding Musculo Skeletal  No myalgia, arthralgia, joint swelling or pain  Neurologic  No  weakness, numbness, change in gait,  Psychology  No depression, anxiety, insomnia.   Vitals:  Blood pressure 138/80, pulse 82, temperature 98.3 F (36.8 C), temperature source Oral, resp. rate 18, height 4\' 11"  (1.499 m), weight  189 lb (85.7 kg), SpO2 100 %.  Physical Exam: WD in NAD Neck  Supple NROM, without any enlargements.  Lymph Node Survey No cervical supraclavicular or inguinal adenopathy Cardiovascular  Pulse normal rate, regularity and rhythm. S1 and S2 normal.  Lungs  Clear to auscultation bilateraly, without wheezes/crackles/rhonchi. Good air movement.  Skin  No rash/lesions/breakdown  Psychiatry  Alert and oriented to person, place, and time  Abdomen  Normoactive bowel sounds, abdomen soft, non-tender and mildly obese without evidence of hernia.  Back No CVA tenderness Genito Urinary  Vulva/vagina: Normal external female genitalia.   No lesions. No discharge or bleeding.  Bladder/urethra:  No lesions or masses, well supported bladder  Vagina: normal  Cervix: Normal appearing, no lesions.  Uterus: slightly enlarged, mobile, no parametrial involvement or nodularity.  Adnexa: no discrete masses. Rectal  deferred Extremities  No bilateral cyanosis, clubbing or edema.   Thereasa Solo, MD  05/17/2018, 5:02 PM

## 2018-05-17 NOTE — Patient Instructions (Signed)
Preparing for your Surgery  Plan for surgery on May 24, 2018 with Dr. [name] at [place] will be scheduled for a robotic assisted total laparoscopic hysterectomy, bilateral salpingo-oophorectomy, sentinel lymph node biopsy.  Pre-operative Testing -You will receive a phone call from presurgical testing at [facility] to arrange for a pre-operative testing appointment before your surgery.  This appointment normally occurs one to two weeks before your scheduled surgery.   -Bring your insurance card, copy of an advanced directive if applicable, medication list  -At that visit, you will be asked to sign a consent for a possible blood transfusion in case a transfusion becomes necessary during surgery.  The need for a blood transfusion is rare but having consent is a necessary part of your care.     -You should not be taking blood thinners or aspirin at least ten days prior to surgery unless instructed by your surgeon.  Day Before Surgery at [place] will be asked to take in a light diet the day before surgery.  Avoid carbonated beverages.  You will be advised to have nothing to eat or drink after midnight the evening before.    Eat a light diet the day before surgery.  Examples including soups, broths, toast, yogurt, mashed potatoes.  Things to avoid include carbonated beverages (fizzy beverages), raw fruits and raw vegetables, or beans.   If your bowels are filled with gas, your surgeon will have difficulty visualizing your pelvic organs which increases your surgical risks.  Your role in recovery Your role is to become active as soon as directed by your doctor, while still giving yourself time to heal.  Rest when you feel tired. You will be asked to do the following in order to speed your recovery:  - Cough and breathe deeply. This helps toclear and expand your lungs and can prevent pneumonia. You may be given a spirometer to practice deep  breathing. A staff member will show you how to use the spirometer. - Do mild physical activity. Walking or moving your legs help your circulation and body functions return to normal. A staff member will help you when you try to walk and will provide you with simple exercises. Do not try to get up or walk alone the first time. - Actively manage your pain. Managing your pain lets you move in comfort. We will ask you to rate your pain on a scale of zero to 10. It is your responsibility to tell your doctor or nurse where and how much you hurt so your pain can be treated.  Special Considerations -If you are diabetic, you may be placed on insulin after surgery to have closer control over your blood sugars to promote healing and recovery.  This does not mean that you will be discharged on insulin.  If applicable, your oral antidiabetics will be resumed when you are tolerating a solid diet.  -Dr. Precious Haws is the Surgeon that assists your GYN Oncologist with surgery.  The next day after your surgery you will either see your GYN Oncologist, Dr. Gerarda Fraction, or Dr. Lahoma Crocker.   Blood Transfusion Information WHAT IS A BLOOD TRANSFUSION? A transfusion is the replacement of blood or some of its parts. Blood is made up of multiple cells which provide different functions.  Red blood cells carry oxygen and are used for blood loss replacement.  White blood cells fight against infection.  Platelets control bleeding.  Plasma helps clot blood.  Other blood products  are available for specialized needs, such as hemophilia or other clotting disorders. BEFORE THE TRANSFUSION  Who gives blood for transfusions?   You may be able to donate blood to be used at a later date on yourself (autologous donation).  Relatives can be asked to donate blood. This is generally not any safer than if you have received blood from a stranger. The same precautions are taken to ensure safety when a relative's blood is  donated.  Healthy volunteers who are fully evaluated to make sure their blood is safe. This is blood bank blood. Transfusion therapy is the safest it has ever been in the practice of medicine. Before blood is taken from a donor, a complete history is taken to make sure that person has no history of diseases nor engages in risky social behavior (examples are intravenous drug use or sexual activity with multiple partners). The donor's travel history is screened to minimize risk of transmitting infections, such as malaria. The donated blood is tested for signs of infectious diseases, such as HIV and hepatitis. The blood is then tested to be sure it is compatible with you in order to minimize the chance of a transfusion reaction. If you or a relative donates blood, this is often done in anticipation of surgery and is not appropriate for emergency situations. It takes many days to process the donated blood. RISKS AND COMPLICATIONS Although transfusion therapy is very safe and saves many lives, the main dangers of transfusion include:   Getting an infectious disease.  Developing a transfusion reaction. This is an allergic reaction to something in the blood you were given. Every precaution is taken to prevent this. The decision to have a blood transfusion has been considered carefully by your caregiver before blood is given. Blood is not given unless the benefits outweigh the risks.

## 2018-05-18 ENCOUNTER — Encounter: Payer: Medicare HMO | Admitting: Obstetrics & Gynecology

## 2018-05-18 NOTE — Patient Instructions (Addendum)
Brenda Alexander  05/18/2018   Your procedure is scheduled on: 05-24-18     Report to [facility] Main  Entrance    Report to Admitting at 8:30 AM    Call this number if you have problems the morning of surgery [phone]   Eat a light diet the day before surgery.  Examples including soups, broths, toast, yogurt, mashed potatoes.  Things to avoid include carbonated beverages (fizzy beverages), raw fruits and raw vegetables, or beans.   If your bowels are filled with gas, your surgeon will have difficulty visualizing your pelvic organs which increases your surgical risks.    Remember: Do not eat food or drink liquids :After Midnight.    BRUSH YOUR TEETH MORNING OF SURGERY AND RINSE YOUR MOUTH OUT, NO CHEWING GUM CANDY OR MINTS.     Take these medicines the morning of surgery with A SIP OF WATER: Amlodipine (Norvasc), Clonidine (Catapres), Isosorbide Mononitrate, and Metoprolol Succinate                               You may not have any metal on your body including hair pins and              piercings  Do not wear jewelry, make-up, lotions, powders or perfumes, deodorant             Do not wear nail polish.  Do not shave  48 hours prior to surgery.                Do not bring valuables to the hospital. [place].  Contacts, dentures or bridgework may not be worn into surgery.  Leave suitcase in the car. After surgery it may be brought to your room.   Special Instructions: Follow your prep, per your surgeon's instructions. Cough and Deep Breath Exercises              Please read over the following fact sheets you were given: _____________________________________________________________________             [facility] - Preparing for Surgery Before surgery, you can play an important role.  Because skin is not sterile, your skin needs to be as free of germs as possible.  You can reduce the number of germs on  your skin by washing with CHG (chlorahexidine gluconate) soap before surgery.  CHG is an antiseptic cleaner which kills germs and bonds with the skin to continue killing germs even after washing. Please DO NOT use if you have an allergy to CHG or antibacterial soaps.  If your skin becomes reddened/irritated stop using the CHG and inform your nurse when you arrive at Short Stay. Do not shave (including legs and underarms) for at least 48 hours prior to the first CHG shower.  You may shave your face/neck. Please follow these instructions carefully:  1.  Shower with CHG Soap the night before surgery and the  morning of Surgery.  2.  If you choose to wash your hair, wash your hair first as usual with your  normal  shampoo.  3.  After you shampoo, rinse your hair and body thoroughly to remove the  shampoo.  4.  Use CHG as you would any other liquid soap.  You can apply chg directly  to the skin and wash                       Gently with a scrungie or clean washcloth.  5.  Apply the CHG Soap to your body ONLY FROM THE NECK DOWN.   Do not use on face/ open                           Wound or open sores. Avoid contact with eyes, ears mouth and genitals (private parts).                       Wash face,  Genitals (private parts) with your normal soap.             6.  Wash thoroughly, paying special attention to the area where your surgery  will be performed.  7.  Thoroughly rinse your body with warm water from the neck down.  8.  DO NOT shower/wash with your normal soap after using and rinsing off  the CHG Soap.                9.  Pat yourself dry with a clean towel.            10.  Wear clean pajamas.            11.  Place clean sheets on your bed the night of your first shower and do not  sleep with pets. Day of Surgery : Do not apply any lotions/deodorants the morning of surgery.  Please wear clean clothes to the hospital/surgery center.  FAILURE TO FOLLOW THESE INSTRUCTIONS MAY  RESULT IN THE CANCELLATION OF YOUR SURGERY PATIENT SIGNATURE_________________________________  NURSE SIGNATURE__________________________________  ________________________________________________________________________   Adam Phenix  An incentive spirometer is a tool that can help keep your lungs clear and active. This tool measures how well you are filling your lungs with each breath. Taking long deep breaths may help reverse or decrease the chance of developing breathing (pulmonary) problems (especially infection) following:  A long period of time when you are unable to move or be active. BEFORE THE PROCEDURE   If the spirometer includes an indicator to show your best effort, your nurse or respiratory therapist will set it to a desired goal.  If possible, sit up straight or lean slightly forward. Try not to slouch.  Hold the incentive spirometer in an upright position. INSTRUCTIONS FOR USE  1. Sit on the edge of your bed if possible, or sit up as far as you can in bed or on a chair. 2. Hold the incentive spirometer in an upright position. 3. Breathe out normally. 4. Place the mouthpiece in your mouth and seal your lips tightly around it. 5. Breathe in slowly and as deeply as possible, raising the piston or the ball toward the top of the column. 6. Hold your breath for 3-5 seconds or for as long as possible. Allow the piston or ball to fall to the bottom of the column. 7. Remove the mouthpiece from your mouth and breathe out normally. 8. Rest for a few seconds and repeat Steps 1 through 7 at least 10 times every 1-2 hours when you are awake. Take your time and take a few normal breaths between deep breaths. 9. The spirometer may include an indicator to show  your best effort. Use the indicator as a goal to work toward during each repetition. 10. After each set of 10 deep breaths, practice coughing to be sure your lungs are clear. If you have an incision (the cut made at the  time of surgery), support your incision when coughing by placing a pillow or rolled up towels firmly against it. Once you are able to get out of bed, walk around indoors and cough well. You may stop using the incentive spirometer when instructed by your caregiver.  RISKS AND COMPLICATIONS  Take your time so you do not get dizzy or light-headed.  If you are in pain, you may need to take or ask for pain medication before doing incentive spirometry. It is harder to take a deep breath if you are having pain. AFTER USE  Rest and breathe slowly and easily.  It can be helpful to keep track of a log of your progress. Your caregiver can provide you with a simple table to help with this. If you are using the spirometer at home, follow these instructions: [place] IF:   You are having difficultly using the spirometer.  You have trouble using the spirometer as often as instructed.  Your pain medication is not giving enough relief while using the spirometer.  You develop fever of 100.5 F (38.1 C) or higher. SEEK IMMEDIATE MEDICAL CARE IF:   You cough up bloody sputum that had not been present before.  You develop fever of 102 F (38.9 C) or greater.  You develop worsening pain at or near the incision site. MAKE SURE YOU:   Understand these instructions.  Will watch your condition.  Will get help right away if you are not doing well or get worse. Document Released: 11/24/2006 Document Revised: 10/06/2011 Document Reviewed: 01/25/2007 ExitCare Patient Information 2014 Exit[employer], [place].   ________________________________________________________________________  WHAT IS A BLOOD TRANSFUSION? Blood Transfusion Information  A transfusion is the replacement of blood or some of its parts. Blood is made up of multiple cells which provide different functions.  Red blood cells carry oxygen and are used for blood loss replacement.  White blood cells fight against  infection.  Platelets control bleeding.  Plasma helps clot blood.  Other blood products are available for specialized needs, such as hemophilia or other clotting disorders. BEFORE THE TRANSFUSION  Who gives blood for transfusions?   Healthy volunteers who are fully evaluated to make sure their blood is safe. This is blood bank blood. Transfusion therapy is the safest it has ever been in the practice of medicine. Before blood is taken from a donor, a complete history is taken to make sure that person has no history of diseases nor engages in risky social behavior (examples are intravenous drug use or sexual activity with multiple partners). The donor's travel history is screened to minimize risk of transmitting infections, such as malaria. The donated blood is tested for signs of infectious diseases, such as HIV and hepatitis. The blood is then tested to be sure it is compatible with you in order to minimize the chance of a transfusion reaction. If you or a relative donates blood, this is often done in anticipation of surgery and is not appropriate for emergency situations. It takes many days to process the donated blood. RISKS AND COMPLICATIONS Although transfusion therapy is very safe and saves many lives, the main dangers of transfusion include:   Getting an infectious disease.  Developing a transfusion reaction. This is an allergic reaction to  something in the blood you were given. Every precaution is taken to prevent this. The decision to have a blood transfusion has been considered carefully by your caregiver before blood is given. Blood is not given unless the benefits outweigh the risks. AFTER THE TRANSFUSION  Right after receiving a blood transfusion, you will usually feel much better and more energetic. This is especially true if your red blood cells have gotten low (anemic). The transfusion raises the level of the red blood cells which carry oxygen, and this usually causes an energy  increase.  The nurse administering the transfusion will monitor you carefully for complications. HOME CARE INSTRUCTIONS  No special instructions are needed after a transfusion. You may find your energy is better. Speak with your caregiver about any limitations on activity for underlying diseases you may have. SEEK MEDICAL CARE IF:   Your condition is not improving after your transfusion.  You develop redness or irritation at the intravenous (IV) site. SEEK IMMEDIATE MEDICAL CARE IF:  Any of the following symptoms occur over the next 12 hours:  Shaking chills.  You have a temperature by mouth above 102 F (38.9 C), not controlled by medicine.  Chest, back, or muscle pain.  People around you feel you are not acting correctly or are confused.  Shortness of breath or difficulty breathing.  Dizziness and fainting.  You get a rash or develop hives.  You have a decrease in urine output.  Your urine turns a dark color or changes to pink, red, or brown. Any of the following symptoms occur over the next 10 days:  You have a temperature by mouth above 102 F (38.9 C), not controlled by medicine.  Shortness of breath.  Weakness after normal activity.  The white part of the eye turns yellow (jaundice).  You have a decrease in the amount of urine or are urinating less often.  Your urine turns a dark color or changes to pink, red, or brown. Document Released: 07/11/2000 Document Revised: 10/06/2011 Document Reviewed: 02/28/2008 [facility] Patient Information 2014 [place], [place].  _______________________________________________________________________

## 2018-05-18 NOTE — Progress Notes (Signed)
01-14-18 (Epic) EKG

## 2018-05-19 ENCOUNTER — Encounter (HOSPITAL_COMMUNITY)
Admission: RE | Admit: 2018-05-19 | Discharge: 2018-05-19 | Disposition: A | Discharge status: Home / Self Care | Payer: Medicare HMO | Source: Ambulatory Visit | Attending: Gynecologic Oncology | Admitting: Gynecologic Oncology

## 2018-05-19 ENCOUNTER — Encounter (HOSPITAL_COMMUNITY): Payer: Self-pay

## 2018-05-19 ENCOUNTER — Other Ambulatory Visit: Payer: Self-pay

## 2018-05-19 DIAGNOSIS — I2510 Atherosclerotic heart disease of native coronary artery without angina pectoris: Secondary | ICD-10-CM | POA: Diagnosis not present

## 2018-05-19 DIAGNOSIS — E119 Type 2 diabetes mellitus without complications: Secondary | ICD-10-CM | POA: Insufficient documentation

## 2018-05-19 DIAGNOSIS — M1990 Unspecified osteoarthritis, unspecified site: Secondary | ICD-10-CM | POA: Insufficient documentation

## 2018-05-19 DIAGNOSIS — Z79899 Other long term (current) drug therapy: Secondary | ICD-10-CM | POA: Insufficient documentation

## 2018-05-19 DIAGNOSIS — Z7982 Long term (current) use of aspirin: Secondary | ICD-10-CM | POA: Diagnosis not present

## 2018-05-19 DIAGNOSIS — D649 Anemia, unspecified: Secondary | ICD-10-CM | POA: Diagnosis not present

## 2018-05-19 DIAGNOSIS — Z01812 Encounter for preprocedural laboratory examination: Secondary | ICD-10-CM | POA: Insufficient documentation

## 2018-05-19 DIAGNOSIS — I10 Essential (primary) hypertension: Secondary | ICD-10-CM | POA: Insufficient documentation

## 2018-05-19 LAB — CBC
HCT: 32.8 % — ABNORMAL LOW (ref 36.0–46.0)
Hemoglobin: 9.5 g/dL — ABNORMAL LOW (ref 12.0–15.0)
MCH: 26.3 pg (ref 26.0–34.0)
MCHC: 29 g/dL — ABNORMAL LOW (ref 30.0–36.0)
MCV: 90.9 fL (ref 80.0–100.0)
Platelets: 278 10*3/uL (ref 150–400)
RBC: 3.61 MIL/uL — ABNORMAL LOW (ref 3.87–5.11)
RDW: 14.9 % (ref 11.5–15.5)
WBC: 4.4 10*3/uL (ref 4.0–10.5)
nRBC: 0 % (ref 0.0–0.2)

## 2018-05-19 LAB — COMPREHENSIVE METABOLIC PANEL
ALT: 10 U/L (ref 0–44)
AST: 18 U/L (ref 15–41)
Albumin: 4 g/dL (ref 3.5–5.0)
Alkaline Phosphatase: 64 U/L (ref 38–126)
Anion gap: 7 (ref 5–15)
BUN: 16 mg/dL (ref 8–23)
CO2: 28 mmol/L (ref 22–32)
Calcium: 9.2 mg/dL (ref 8.9–10.3)
Chloride: 104 mmol/L (ref 98–111)
Creatinine, Ser: 0.68 mg/dL (ref 0.44–1.00)
GFR calc Af Amer: >60 mL/min (ref >60)
GFR calc non Af Amer: >60 mL/min (ref >60)
Glucose, Bld: 113 mg/dL — ABNORMAL HIGH (ref 70–99)
Potassium: 3.8 mmol/L (ref 3.5–5.1)
Sodium: 139 mmol/L (ref 135–145)
Total Bilirubin: 0.7 mg/dL (ref 0.3–1.2)
Total Protein: 7.1 g/dL (ref 6.5–8.1)

## 2018-05-19 LAB — URINALYSIS, ROUTINE W REFLEX MICROSCOPIC
Bilirubin Urine: NEGATIVE
Glucose, UA: NEGATIVE mg/dL
Ketones, ur: 5 mg/dL — AB
Nitrite: NEGATIVE
Protein, ur: 30 mg/dL — AB
RBC / HPF: >50 RBC/hpf — ABNORMAL HIGH (ref 0–5)
Specific Gravity, Urine: 1.025 (ref 1.005–1.030)
pH: 5 (ref 5.0–8.0)

## 2018-05-19 LAB — HEMOGLOBIN A1C
Hgb A1c MFr Bld: 5 % (ref 4.8–5.6)
Mean Plasma Glucose: 96.8 mg/dL

## 2018-05-19 LAB — ABO/RH: ABO/RH(D): O POS

## 2018-05-19 NOTE — Progress Notes (Signed)
05-19-18 UA and CBC results routed to Dr. Denman George for review

## 2018-05-20 NOTE — Pre-Procedure Instructions (Signed)
Urine culture results 05/19/2018 sent to Dr. Denman George and Jerilynn Mages. Cross NP via epic.

## 2018-05-21 LAB — URINE CULTURE: Culture: 10000 — AB

## 2018-05-24 ENCOUNTER — Ambulatory Visit (HOSPITAL_COMMUNITY)
Admission: RE | Admit: 2018-05-24 | Discharge: 2018-05-25 | Disposition: A | Discharge status: Home / Self Care | Payer: Medicare HMO | Source: Ambulatory Visit | Attending: Gynecologic Oncology | Admitting: Gynecologic Oncology

## 2018-05-24 ENCOUNTER — Ambulatory Visit (HOSPITAL_COMMUNITY): Payer: Medicare HMO | Admitting: Anesthesiology

## 2018-05-24 ENCOUNTER — Encounter (HOSPITAL_COMMUNITY): Payer: Self-pay | Admitting: Emergency Medicine

## 2018-05-24 ENCOUNTER — Encounter (HOSPITAL_COMMUNITY)
Admission: RE | Disposition: A | Discharge status: Home / Self Care | Payer: Self-pay | Source: Ambulatory Visit | Attending: Gynecologic Oncology

## 2018-05-24 DIAGNOSIS — E119 Type 2 diabetes mellitus without complications: Secondary | ICD-10-CM | POA: Insufficient documentation

## 2018-05-24 DIAGNOSIS — Z880 Allergy status to penicillin: Secondary | ICD-10-CM | POA: Diagnosis not present

## 2018-05-24 DIAGNOSIS — D649 Anemia, unspecified: Secondary | ICD-10-CM | POA: Diagnosis not present

## 2018-05-24 DIAGNOSIS — N800 Endometriosis of uterus: Secondary | ICD-10-CM | POA: Diagnosis not present

## 2018-05-24 DIAGNOSIS — Z91018 Allergy to other foods: Secondary | ICD-10-CM | POA: Insufficient documentation

## 2018-05-24 DIAGNOSIS — Z87892 Personal history of anaphylaxis: Secondary | ICD-10-CM | POA: Insufficient documentation

## 2018-05-24 DIAGNOSIS — Z823 Family history of stroke: Secondary | ICD-10-CM | POA: Diagnosis not present

## 2018-05-24 DIAGNOSIS — I2510 Atherosclerotic heart disease of native coronary artery without angina pectoris: Secondary | ICD-10-CM | POA: Insufficient documentation

## 2018-05-24 DIAGNOSIS — C541 Malignant neoplasm of endometrium: Secondary | ICD-10-CM

## 2018-05-24 DIAGNOSIS — D259 Leiomyoma of uterus, unspecified: Secondary | ICD-10-CM | POA: Insufficient documentation

## 2018-05-24 DIAGNOSIS — M1990 Unspecified osteoarthritis, unspecified site: Secondary | ICD-10-CM | POA: Diagnosis not present

## 2018-05-24 DIAGNOSIS — E785 Hyperlipidemia, unspecified: Secondary | ICD-10-CM | POA: Insufficient documentation

## 2018-05-24 DIAGNOSIS — C55 Malignant neoplasm of uterus, part unspecified: Secondary | ICD-10-CM

## 2018-05-24 DIAGNOSIS — Z8249 Family history of ischemic heart disease and other diseases of the circulatory system: Secondary | ICD-10-CM | POA: Insufficient documentation

## 2018-05-24 DIAGNOSIS — I10 Essential (primary) hypertension: Secondary | ICD-10-CM | POA: Insufficient documentation

## 2018-05-24 DIAGNOSIS — Z79899 Other long term (current) drug therapy: Secondary | ICD-10-CM | POA: Diagnosis not present

## 2018-05-24 HISTORY — PX: ROBOTIC ASSISTED TOTAL HYSTERECTOMY WITH BILATERAL SALPINGO OOPHERECTOMY: SHX6086

## 2018-05-24 HISTORY — PX: SENTINEL NODE BIOPSY: SHX6608

## 2018-05-24 LAB — GLUCOSE, CAPILLARY
Glucose-Capillary: 109 mg/dL — ABNORMAL HIGH (ref 70–99)
Glucose-Capillary: 128 mg/dL — ABNORMAL HIGH (ref 70–99)

## 2018-05-24 LAB — TYPE AND SCREEN
ABO/RH(D): O POS
Antibody Screen: NEGATIVE

## 2018-05-24 SURGERY — HYSTERECTOMY, TOTAL, ROBOT-ASSISTED, LAPAROSCOPIC, WITH BILATERAL SALPINGO-OOPHORECTOMY
Anesthesia: General

## 2018-05-24 MED ORDER — ONDANSETRON HCL 4 MG/2ML IJ SOLN
INTRAMUSCULAR | Status: DC | PRN
  Administered 2018-05-24: 4 mg via INTRAVENOUS

## 2018-05-24 MED ORDER — ROCURONIUM BROMIDE 50 MG/5ML IV SOSY
PREFILLED_SYRINGE | INTRAVENOUS | Status: DC | PRN
  Administered 2018-05-24: 50 mg via INTRAVENOUS
  Administered 2018-05-24: 10 mg via INTRAVENOUS

## 2018-05-24 MED ORDER — AMLODIPINE BESYLATE 5 MG PO TABS
5.0000 mg | ORAL_TABLET | Freq: Every day | ORAL | Status: DC
  Administered 2018-05-25: 5 mg via ORAL
  Filled 2018-05-24: qty 1

## 2018-05-24 MED ORDER — KETOROLAC TROMETHAMINE 15 MG/ML IJ SOLN: 15.0000 mg | Freq: Four times a day (QID) | INTRAMUSCULAR | Status: DC | PRN

## 2018-05-24 MED ORDER — NYSTATIN 100000 UNIT/GM EX POWD
Freq: Two times a day (BID) | CUTANEOUS | Status: DC
  Administered 2018-05-24 – 2018-05-25 (×3): via TOPICAL
  Filled 2018-05-24: qty 15

## 2018-05-24 MED ORDER — ONDANSETRON HCL 4 MG/2ML IJ SOLN
INTRAMUSCULAR | Status: AC
  Filled 2018-05-24: qty 2

## 2018-05-24 MED ORDER — ONDANSETRON HCL 4 MG/2ML IJ SOLN
4.0000 mg | Freq: Four times a day (QID) | INTRAMUSCULAR | Status: DC | PRN
  Administered 2018-05-24: 4 mg via INTRAVENOUS

## 2018-05-24 MED ORDER — LIDOCAINE 2% (20 MG/ML) 5 ML SYRINGE
INTRAMUSCULAR | Status: AC
  Filled 2018-05-24: qty 5

## 2018-05-24 MED ORDER — OXYCODONE HCL 5 MG/5ML PO SOLN
5.0000 mg | Freq: Once | ORAL | Status: DC | PRN
  Filled 2018-05-24: qty 5

## 2018-05-24 MED ORDER — LACTATED RINGERS IV SOLN
INTRAVENOUS | Status: DC
  Administered 2018-05-24: 09:00:00 via INTRAVENOUS

## 2018-05-24 MED ORDER — OXYCODONE HCL 5 MG PO TABS: 5.0000 mg | ORAL_TABLET | ORAL | Status: DC | PRN

## 2018-05-24 MED ORDER — FUROSEMIDE 40 MG PO TABS
40.0000 mg | ORAL_TABLET | Freq: Every day | ORAL | Status: DC
  Administered 2018-05-25: 40 mg via ORAL
  Filled 2018-05-24: qty 1

## 2018-05-24 MED ORDER — LACTATED RINGERS IR SOLN
Status: DC | PRN
  Administered 2018-05-24: 1000 mL

## 2018-05-24 MED ORDER — PROPOFOL 10 MG/ML IV BOLUS
INTRAVENOUS | Status: AC
  Filled 2018-05-24: qty 20

## 2018-05-24 MED ORDER — INDOCYANINE GREEN 25 MG IV SOLR
INTRAVENOUS | Status: DC | PRN
  Administered 2018-05-24: 2.5 mg

## 2018-05-24 MED ORDER — LIDOCAINE 2% (20 MG/ML) 5 ML SYRINGE
INTRAMUSCULAR | Status: DC | PRN
  Administered 2018-05-24: 1.5 mg/kg/h via INTRAVENOUS

## 2018-05-24 MED ORDER — GABAPENTIN 300 MG PO CAPS
300.0000 mg | ORAL_CAPSULE | ORAL | Status: AC
  Administered 2018-05-24: 300 mg via ORAL
  Filled 2018-05-24: qty 1

## 2018-05-24 MED ORDER — ENOXAPARIN SODIUM 40 MG/0.4ML ~~LOC~~ SOLN
40.0000 mg | SUBCUTANEOUS | Status: DC
  Administered 2018-05-25: 40 mg via SUBCUTANEOUS
  Filled 2018-05-24: qty 0.4

## 2018-05-24 MED ORDER — METOPROLOL SUCCINATE ER 50 MG PO TB24
50.0000 mg | ORAL_TABLET | Freq: Every day | ORAL | Status: DC
  Administered 2018-05-25: 50 mg via ORAL
  Filled 2018-05-24: qty 1

## 2018-05-24 MED ORDER — MIDAZOLAM HCL 2 MG/2ML IJ SOLN
INTRAMUSCULAR | Status: AC
  Filled 2018-05-24: qty 2

## 2018-05-24 MED ORDER — FENTANYL CITRATE (PF) 100 MCG/2ML IJ SOLN
INTRAMUSCULAR | Status: DC | PRN
  Administered 2018-05-24 (×2): 50 ug via INTRAVENOUS

## 2018-05-24 MED ORDER — ENALAPRIL MALEATE 10 MG PO TABS
20.0000 mg | ORAL_TABLET | Freq: Two times a day (BID) | ORAL | Status: DC
  Administered 2018-05-25: 20 mg via ORAL
  Filled 2018-05-24: qty 2

## 2018-05-24 MED ORDER — SUGAMMADEX SODIUM 200 MG/2ML IV SOLN
INTRAVENOUS | Status: AC
  Filled 2018-05-24: qty 2

## 2018-05-24 MED ORDER — DEXAMETHASONE SODIUM PHOSPHATE 10 MG/ML IJ SOLN
INTRAMUSCULAR | Status: AC
  Filled 2018-05-24: qty 1

## 2018-05-24 MED ORDER — SIMVASTATIN 20 MG PO TABS: 20.0000 mg | ORAL_TABLET | Freq: Every day | ORAL | Status: DC

## 2018-05-24 MED ORDER — ISOSORBIDE MONONITRATE 20 MG PO TABS
20.0000 mg | ORAL_TABLET | Freq: Every day | ORAL | Status: DC
  Administered 2018-05-25: 20 mg via ORAL
  Filled 2018-05-24: qty 1

## 2018-05-24 MED ORDER — FENTANYL CITRATE (PF) 100 MCG/2ML IJ SOLN: 25.0000 ug | INTRAMUSCULAR | Status: DC | PRN

## 2018-05-24 MED ORDER — DEXAMETHASONE SODIUM PHOSPHATE 4 MG/ML IJ SOLN
4.0000 mg | INTRAMUSCULAR | Status: AC
  Administered 2018-05-24: 8 mg via INTRAVENOUS

## 2018-05-24 MED ORDER — POTASSIUM CHLORIDE CRYS ER 20 MEQ PO TBCR
20.0000 meq | EXTENDED_RELEASE_TABLET | Freq: Every day | ORAL | Status: DC
  Administered 2018-05-25: 20 meq via ORAL
  Filled 2018-05-24: qty 1

## 2018-05-24 MED ORDER — ACETAMINOPHEN 500 MG PO TABS
1000.0000 mg | ORAL_TABLET | Freq: Four times a day (QID) | ORAL | Status: DC
  Administered 2018-05-24 – 2018-05-25 (×3): 1000 mg via ORAL
  Filled 2018-05-24 (×3): qty 2

## 2018-05-24 MED ORDER — ACETAMINOPHEN 500 MG PO TABS
1000.0000 mg | ORAL_TABLET | ORAL | Status: AC
  Administered 2018-05-24: 1000 mg via ORAL
  Filled 2018-05-24: qty 2

## 2018-05-24 MED ORDER — FENTANYL CITRATE (PF) 250 MCG/5ML IJ SOLN
INTRAMUSCULAR | Status: AC
  Filled 2018-05-24: qty 5

## 2018-05-24 MED ORDER — EPHEDRINE 5 MG/ML INJ
INTRAVENOUS | Status: AC
  Filled 2018-05-24: qty 10

## 2018-05-24 MED ORDER — OXYCODONE HCL 5 MG PO TABS: 5.0000 mg | ORAL_TABLET | Freq: Once | ORAL | Status: DC | PRN

## 2018-05-24 MED ORDER — STERILE WATER FOR INJECTION IJ SOLN
INTRAMUSCULAR | Status: AC
  Filled 2018-05-24: qty 10

## 2018-05-24 MED ORDER — LIDOCAINE HCL 2 % IJ SOLN
INTRAMUSCULAR | Status: AC
  Filled 2018-05-24: qty 20

## 2018-05-24 MED ORDER — EPHEDRINE SULFATE 50 MG/ML IJ SOLN
INTRAMUSCULAR | Status: DC | PRN
  Administered 2018-05-24 (×2): 5 mg via INTRAVENOUS

## 2018-05-24 MED ORDER — STERILE WATER FOR INJECTION IJ SOLN
INTRAMUSCULAR | Status: DC | PRN
  Administered 2018-05-24: 10 mL

## 2018-05-24 MED ORDER — ONDANSETRON HCL 4 MG/2ML IJ SOLN: 4.0000 mg | Freq: Once | INTRAMUSCULAR | Status: DC | PRN

## 2018-05-24 MED ORDER — HYDROMORPHONE HCL 1 MG/ML IJ SOLN: 0.5000 mg | INTRAMUSCULAR | Status: DC | PRN

## 2018-05-24 MED ORDER — CLONIDINE HCL 0.2 MG PO TABS: 0.2000 mg | ORAL_TABLET | Freq: Two times a day (BID) | ORAL | Status: DC

## 2018-05-24 MED ORDER — POTASSIUM CHLORIDE IN NACL 20-0.45 MEQ/L-% IV SOLN
INTRAVENOUS | Status: DC
  Administered 2018-05-24: 14:00:00 via INTRAVENOUS
  Filled 2018-05-24 (×2): qty 1000

## 2018-05-24 MED ORDER — MEPERIDINE HCL 50 MG/ML IJ SOLN: 6.2500 mg | INTRAMUSCULAR | Status: DC | PRN

## 2018-05-24 MED ORDER — ONDANSETRON HCL 4 MG PO TABS: 4.0000 mg | ORAL_TABLET | Freq: Four times a day (QID) | ORAL | Status: DC | PRN

## 2018-05-24 MED ORDER — CLINDAMYCIN PHOSPHATE 900 MG/50ML IV SOLN
900.0000 mg | INTRAVENOUS | Status: AC
  Administered 2018-05-24: 900 mg via INTRAVENOUS
  Filled 2018-05-24: qty 50

## 2018-05-24 MED ORDER — ROCURONIUM BROMIDE 10 MG/ML (PF) SYRINGE
PREFILLED_SYRINGE | INTRAVENOUS | Status: AC
  Filled 2018-05-24: qty 10

## 2018-05-24 MED ORDER — LIDOCAINE 2% (20 MG/ML) 5 ML SYRINGE
INTRAMUSCULAR | Status: DC | PRN
  Administered 2018-05-24: 80 mg via INTRAVENOUS

## 2018-05-24 MED ORDER — SUGAMMADEX SODIUM 200 MG/2ML IV SOLN
INTRAVENOUS | Status: DC | PRN
  Administered 2018-05-24: 200 mg via INTRAVENOUS

## 2018-05-24 MED ORDER — CIPROFLOXACIN IN D5W 400 MG/200ML IV SOLN
400.0000 mg | INTRAVENOUS | Status: AC
  Administered 2018-05-24: 400 mg via INTRAVENOUS
  Filled 2018-05-24: qty 200

## 2018-05-24 MED ORDER — CYCLOSPORINE 0.05 % OP EMUL
1.0000 [drp] | Freq: Two times a day (BID) | OPHTHALMIC | Status: DC
  Administered 2018-05-24 – 2018-05-25 (×2): 1 [drp] via OPHTHALMIC
  Filled 2018-05-24 (×2): qty 1

## 2018-05-24 MED ORDER — PROPOFOL 10 MG/ML IV BOLUS
INTRAVENOUS | Status: DC | PRN
  Administered 2018-05-24: 125 mg via INTRAVENOUS

## 2018-05-24 MED ORDER — STERILE WATER FOR IRRIGATION IR SOLN
Status: DC | PRN
  Administered 2018-05-24: 1000 mL

## 2018-05-24 MED ORDER — ENOXAPARIN SODIUM 40 MG/0.4ML ~~LOC~~ SOLN
40.0000 mg | SUBCUTANEOUS | Status: AC
  Administered 2018-05-24: 40 mg via SUBCUTANEOUS
  Filled 2018-05-24: qty 0.4

## 2018-05-24 SURGICAL SUPPLY — 47 items
APPLICATOR SURGIFLO ENDO (HEMOSTASIS) IMPLANT
BAG LAPAROSCOPIC 12 15 PORT 16 (BASKET) IMPLANT
BAG RETRIEVAL 12/15 (BASKET)
COVER BACK TABLE 60X90IN (DRAPES) ×3 IMPLANT
COVER TIP SHEARS 8 DVNC (MISCELLANEOUS) ×2 IMPLANT
COVER TIP SHEARS 8MM DA VINCI (MISCELLANEOUS) ×1
COVER WAND RF STERILE (DRAPES) ×3 IMPLANT
DERMABOND ADVANCED (GAUZE/BANDAGES/DRESSINGS) ×1
DERMABOND ADVANCED .7 DNX12 (GAUZE/BANDAGES/DRESSINGS) ×2 IMPLANT
DRAPE ARM DVNC X/XI (DISPOSABLE) ×8 IMPLANT
DRAPE COLUMN DVNC XI (DISPOSABLE) ×2 IMPLANT
DRAPE DA VINCI XI ARM (DISPOSABLE) ×4
DRAPE DA VINCI XI COLUMN (DISPOSABLE) ×1
DRAPE SHEET LG 3/4 BI-LAMINATE (DRAPES) ×3 IMPLANT
DRAPE SURG IRRIG POUCH 19X23 (DRAPES) ×3 IMPLANT
ELECT REM PT RETURN 15FT ADLT (MISCELLANEOUS) ×3 IMPLANT
GLOVE BIO SURGEON STRL SZ 6 (GLOVE) ×12 IMPLANT
GLOVE BIO SURGEON STRL SZ 6.5 (GLOVE) IMPLANT
GOWN STRL REUS W/ TWL LRG LVL3 (GOWN DISPOSABLE) ×4 IMPLANT
GOWN STRL REUS W/TWL LRG LVL3 (GOWN DISPOSABLE) ×2
HOLDER FOLEY CATH W/STRAP (MISCELLANEOUS) ×3 IMPLANT
IRRIG SUCT STRYKERFLOW 2 WTIP (MISCELLANEOUS) ×3
IRRIGATION SUCT STRKRFLW 2 WTP (MISCELLANEOUS) ×2 IMPLANT
KIT PROCEDURE DA VINCI SI (MISCELLANEOUS) ×1
KIT PROCEDURE DVNC SI (MISCELLANEOUS) ×2 IMPLANT
MANIPULATOR UTERINE 4.5 ZUMI (MISCELLANEOUS) ×3 IMPLANT
NEEDLE SPNL 18GX3.5 QUINCKE PK (NEEDLE) ×3 IMPLANT
OBTURATOR OPTICAL STANDARD 8MM (TROCAR) ×1
OBTURATOR OPTICAL STND 8 DVNC (TROCAR) ×2
OBTURATOR OPTICALSTD 8 DVNC (TROCAR) ×2 IMPLANT
PACK ROBOT GYN CUSTOM WL (TRAY / TRAY PROCEDURE) ×3 IMPLANT
PAD POSITIONING PINK XL (MISCELLANEOUS) ×3 IMPLANT
PORT ACCESS TROCAR AIRSEAL 12 (TROCAR) ×2 IMPLANT
PORT ACCESS TROCAR AIRSEAL 5M (TROCAR) ×1
POUCH SPECIMEN RETRIEVAL 10MM (ENDOMECHANICALS) IMPLANT
SEAL CANN UNIV 5-8 DVNC XI (MISCELLANEOUS) ×8 IMPLANT
SEAL XI 5MM-8MM UNIVERSAL (MISCELLANEOUS) ×4
SET TRI-LUMEN FLTR TB AIRSEAL (TUBING) ×3 IMPLANT
SURGIFLO W/THROMBIN 8M KIT (HEMOSTASIS) IMPLANT
SUT VIC AB 0 CT1 27 (SUTURE)
SUT VIC AB 0 CT1 27XBRD ANTBC (SUTURE) IMPLANT
SYR 10ML LL (SYRINGE) ×3 IMPLANT
TOWEL OR NON WOVEN STRL DISP B (DISPOSABLE) ×3 IMPLANT
TRAP SPECIMEN MUCOUS 40CC (MISCELLANEOUS) IMPLANT
TRAY FOLEY MTR SLVR 16FR STAT (SET/KITS/TRAYS/PACK) ×3 IMPLANT
UNDERPAD 30X30 (UNDERPADS AND DIAPERS) ×3 IMPLANT
WATER STERILE IRR 1000ML POUR (IV SOLUTION) ×3 IMPLANT

## 2018-05-24 NOTE — Transfer of Care (Addendum)
Immediate Anesthesia Transfer of Care Note  Patient: Brenda Alexander  Procedure(s) Performed: Procedure(s): XI ROBOTIC ASSISTED TOTAL HYSTERECTOMY WITH BILATERAL SALPINGO OOPHORECTOMY (Bilateral) SENTINEL LYMP  NODE BIOPSY (N/A)  Patient Location: PACU  Anesthesia Type:General  Level of Consciousness: Patient easily awoken, sedated, comfortable, cooperative, following commands, responds to stimulation.   Airway & Oxygen Therapy: Patient spontaneously breathing, ventilating well, oxygen via simple oxygen mask.  Post-op Assessment: Report given to PACU RN, vital signs reviewed and stable, moving all extremities.   Post vital signs: Reviewed and stable.  Complications: No apparent anesthesia complications  Last Vitals:  Vitals Value Taken Time  BP 131/68 05/24/2018 11:52 AM  Temp    Pulse 69 05/24/2018 11:53 AM  Resp 17 05/24/2018 11:53 AM  SpO2 100 % 05/24/2018 11:53 AM  Vitals shown include unvalidated device data.  Last Pain:  Vitals:   05/24/18 0846  TempSrc:   PainSc: 0-No pain      Patients Stated Pain Goal: 4 (07/29/70 5366)  Complications: No apparent anesthesia complications

## 2018-05-24 NOTE — Discharge Instructions (Signed)
05/24/2018  Return to work: 4 weeks  Activity: 1. Be up and out of the bed during the day.  Take a nap if needed.  You may walk up steps but be careful and use the hand rail.  Stair climbing will tire you more than you think, you may need to stop part way and rest.   2. No lifting or straining for 6 weeks.  3. No driving for 1 weeks.  Do Not drive if you are taking narcotic pain medicine.  4. Shower daily.  Use soap and water on your incision and pat dry; don't rub.   5. No sexual activity and nothing in the vagina for 8 weeks.  Medications:  - Take ibuprofen and tylenol first line for pain control. Take these regularly (every 6 hours) to decrease the build up of pain.  - If necessary, for severe pain not relieved by ibuprofen, take percocet.  - While taking percocet you should take sennakot every night to reduce the likelihood of constipation. If this causes diarrhea, stop its use.  Diet: 1. Low sodium Heart Healthy Diet is recommended.  2. It is safe to use a laxative if you have difficulty moving your bowels.   Wound Care: 1. Keep clean and dry.  Shower daily.  Reasons to call the Doctor:   Fever - Oral temperature greater than 100.4 degrees Fahrenheit  Foul-smelling vaginal discharge  Difficulty urinating  Nausea and vomiting  Increased pain at the site of the incision that is unrelieved with pain medicine.  Difficulty breathing with or without chest pain  New calf pain especially if only on one side  Sudden, continuing increased vaginal bleeding with or without clots.   Follow-up: 1. See [name] in 3-4 weeks.  Contacts: For questions or concerns you should contact:  Dr. [name] at [phone] After hours and on week-ends call [phone] and ask to speak to the physician on call for Gynecologic Oncology

## 2018-05-24 NOTE — Anesthesia Preprocedure Evaluation (Addendum)
Anesthesia Evaluation  Patient identified by MRN, date of birth, ID band Patient awake    Reviewed: Allergy & Precautions, NPO status , Patient's Chart, lab work & pertinent test results, reviewed documented beta blocker date and time   History of Anesthesia Complications Negative for: history of anesthetic complications  Airway Mallampati: II  TM Distance: >3 FB Neck ROM: Full    Dental  (+) Partial Upper, Partial Lower   Pulmonary neg pulmonary ROS,    Pulmonary exam normal        Cardiovascular hypertension, Pt. on medications and Pt. on home beta blockers + CAD  Normal cardiovascular exam  Pt is unsure of how she was diagnosed with CAD - she denies any sxs of CP or DOE. No stress test or LHC to indicate diagnosis of CAD in our records. She has a normal EKG from 12/2017. No hx of PCI or CABG. Activity is limited by joint pain and not dyspnea.    Neuro/Psych negative neurological ROS  negative psych ROS   GI/Hepatic negative GI ROS, Neg liver ROS,   Endo/Other  diabetes  Renal/GU negative Renal ROS  negative genitourinary   Musculoskeletal  (+) Arthritis ,   Abdominal   Peds  Hematology  (+) anemia ,   Anesthesia Other Findings   Reproductive/Obstetrics                             Anesthesia Physical  Anesthesia Plan  ASA: III  Anesthesia Plan: General   Post-op Pain Management:    Induction:   PONV Risk Score and Plan: 4 or greater and Ondansetron, Dexamethasone and Treatment may vary due to age or medical condition  Airway Management Planned: Oral ETT  Additional Equipment: None  Intra-op Plan:   Post-operative Plan: Extubation in OR  Informed Consent: I have reviewed the patients History and Physical, chart, labs and discussed the procedure including the risks, benefits and alternatives for the proposed anesthesia with the patient or authorized representative who has  indicated his/her understanding and acceptance.     Plan Discussed with: CRNA, Anesthesiologist and Surgeon  Anesthesia Plan Comments:         Anesthesia Quick Evaluation

## 2018-05-24 NOTE — Op Note (Signed)
OPERATIVE NOTE 05/24/18  Surgeon: Donaciano Eva   Assistants: Dr Lahoma Crocker (an MD assistant was necessary for tissue manipulation, management of robotic instrumentation, retraction and positioning due to the complexity of the case and hospital policies).   Anesthesia: General endotracheal anesthesia  ASA Class: 3   Pre-operative Diagnosis: endometrial cancer grade 1  Post-operative Diagnosis: same,   Operation: Robotic-assisted laparoscopic total hysterectomy with bilateral salpingoophorectomy, SLN biopsy   Surgeon: Donaciano Eva  Assistant Surgeon: Lahoma Crocker MD  Anesthesia: GET  Urine Output: 75  Operative Findings:  : 10cm slightly bulky uterus, grossly normal bladder externally (no visible lesions on posterior base/trigone), normal tubes and ovaries. No suspicious nodes.  Estimated Blood Loss:  less than 50 mL      Total IV Fluids: 700 ml         Specimens: uterus, cervix, bilateral tubes and ovaries, right external iliac SLN, left obturator SLN.          Complications:  None; patient tolerated the procedure well.         Disposition: PACU - hemodynamically stable.  Procedure Details  The patient was seen in the Holding Room. The risks, benefits, complications, treatment options, and expected outcomes were discussed with the patient.  The patient concurred with the proposed plan, giving informed consent.  The site of surgery properly noted/marked. The patient was identified as Brenda Alexander and the procedure verified as a Robotic-assisted hysterectomy with bilateral salpingo oophorectomy with SLN biopsy. A Time Out was held and the above information confirmed.  After induction of anesthesia, the patient was draped and prepped in the usual sterile manner. Pt was placed in supine position after anesthesia and draped and prepped in the usual sterile manner. The abdominal drape was placed after the CholoraPrep had been allowed to dry for 3  minutes.  Her arms were tucked to her side with all appropriate precautions.  The shoulders were stabilized with padded shoulder blocks applied to the acromium processes.  The patient was placed in the semi-lithotomy position in [place].  The perineum was prepped with Betadine. The patient was then prepped. Foley catheter was placed.  A sterile speculum was placed in the vagina.  The cervix was grasped with a single-tooth tenaculum. 2mg  total of ICG was injected into the cervical stroma at 2 and 9 o'clock with 1cc injected at a 1cm and 14mm depth (concentration 0.5mg /ml) in all locations. The cervix was dilated with Kennon Rounds dilators.  The ZUMI uterine manipulator with a medium colpotomizer ring was placed without difficulty.  A pneum occluder balloon was placed over the manipulator.  OG tube placement was confirmed and to suction.   Next, a 5 mm skin incision was made 1 cm below the subcostal margin in the midclavicular line.  The 5 mm Optiview port and scope was used for direct entry.  Opening pressure was under 10 mm CO2.  The abdomen was insufflated and the findings were noted as above.   At this point and all points during the procedure, the patient's intra-abdominal pressure did not exceed 15 mmHg. Next, a 10 mm skin incision was made 2cm above the umbilicus and a right and left port was placed about 10 cm lateral to the robot port on the right and left side.  A fourth arm was placed in the left lower quadrant 2 cm above and superior and medial to the anterior superior iliac spine.  All ports were placed under direct visualization.  The patient was  placed in steep Trendelenburg.  Bowel was folded away into the upper abdomen.  The robot was docked in the normal manner.  The right and left peritoneum were opened parallel to the IP ligament to open the retroperitoneal spaces bilaterally. The SLN mapping was performed in bilateral pelvic basins. The para rectal and paravesical spaces were opened up entirely  with careful dissection below the level of the ureters bilaterally and to the depth of the uterine artery origin in order to skeletonize the uterine "web" and ensure visualization of all parametrial channels. The para-aortic basins were carefully exposed and evaluated for isolated para-aortic SLN's. Lymphatic channels were identified travelling to the following visualized sentinel lymph node's: right external iliac, left obturator. These SLN's were separated from their surrounding lymphatic tissue, removed and sent for permanent pathology.  The hysterectomy was started after the round ligament on the right side was incised and the retroperitoneum was entered and the pararectal space was developed.  The ureter was noted to be on the medial leaf of the broad ligament.  The peritoneum above the ureter was incised and stretched and the infundibulopelvic ligament was skeletonized, cauterized and cut.  The posterior peritoneum was taken down to the level of the KOH ring.  The anterior peritoneum was also taken down.  The bladder flap was created to the level of the KOH ring.  The uterine artery on the right side was skeletonized, cauterized and cut in the normal manner.  A similar procedure was performed on the left.  The colpotomy was made and the uterus, cervix, bilateral ovaries and tubes were amputated and delivered through the vagina.  Pedicles were inspected and excellent hemostasis was achieved.    The colpotomy at the vaginal cuff was closed with Vicryl on a CT1 needle in a running manner.  Irrigation was used and excellent hemostasis was achieved.  At this point in the procedure was completed.  Robotic instruments were removed under direct visulaization.  The robot was undocked. The 10 mm ports were closed with Vicryl on a UR-5 needle and the fascia was closed with 0 Vicryl on a UR-5 needle.  The skin was closed with 4-0 Vicryl in a subcuticular manner.  Dermabond was applied.  Sponge, lap and needle counts  correct x 2.  The patient was taken to the recovery room in stable condition.  The vagina was swabbed with  minimal bleeding noted.   All instrument and needle counts were correct x  3.   The patient was transferred to the recovery room in a stable condition.  Donaciano Eva, MD

## 2018-05-24 NOTE — Interval H&P Note (Signed)
History and Physical Interval Note:  05/24/2018 9:25 AM  [place]  has presented today for surgery, with the diagnosis of ENDOMETRIAL CANCER  The various methods of treatment have been discussed with the patient and family. After consideration of risks, benefits and other options for treatment, the patient has consented to  Procedure(s): XI ROBOTIC ASSISTED TOTAL HYSTERECTOMY WITH BILATERAL SALPINGO OOPHORECTOMY (Bilateral) SENTINEL LYMP  NODE BIOPSY (N/A) as a surgical intervention .  The patient's history has been reviewed, patient examined, no change in status, stable for surgery.  I have reviewed the patient's chart and labs.  Questions were answered to the patient's satisfaction.     Thereasa Solo

## 2018-05-24 NOTE — Anesthesia Postprocedure Evaluation (Signed)
Anesthesia Post Note  Patient: Brenda Alexander  Procedure(s) Performed: XI ROBOTIC ASSISTED TOTAL HYSTERECTOMY WITH BILATERAL SALPINGO OOPHORECTOMY (Bilateral ) SENTINEL LYMP  NODE BIOPSY (N/A )     Anesthesia Type: General    Last Vitals:  Vitals:   05/24/18 1615 05/24/18 1807  BP: 136/72 (!) 165/76  Pulse: 66 73  Resp: 18 18  Temp: 36.8 C 36.6 C  SpO2: 99% 96%    Last Pain:  Vitals:   05/24/18 1807  TempSrc: Oral  PainSc:                  [name]

## 2018-05-24 NOTE — Anesthesia Procedure Notes (Signed)
Procedure Name: Intubation Date/Time: 05/24/2018 10:17 AM Performed by: Deliah Boston, CRNA Pre-anesthesia Checklist: Patient identified, Emergency Drugs available, Suction available and Patient being monitored Patient Re-evaluated:Patient Re-evaluated prior to induction Oxygen Delivery Method: Circle system utilized Preoxygenation: Pre-oxygenation with 100% oxygen Induction Type: IV induction Ventilation: Mask ventilation without difficulty Laryngoscope Size: Mac and 3 Grade View: Grade III Tube type: Oral Tube size: 7.0 mm Number of attempts: 1 Airway Equipment and Method: Oral airway Placement Confirmation: ETT inserted through vocal cords under direct vision,  positive ETCO2 and breath sounds checked- equal and bilateral Secured at: 21 cm Tube secured with: Tape Dental Injury: Teeth and Oropharynx as per pre-operative assessment and Injury to lip  Comments: Small nick to lower lip

## 2018-05-25 ENCOUNTER — Encounter (HOSPITAL_COMMUNITY): Payer: Self-pay | Admitting: Gynecologic Oncology

## 2018-05-25 DIAGNOSIS — C541 Malignant neoplasm of endometrium: Secondary | ICD-10-CM | POA: Diagnosis not present

## 2018-05-25 LAB — BASIC METABOLIC PANEL
Anion gap: 8 (ref 5–15)
BUN: 12 mg/dL (ref 8–23)
CO2: 27 mmol/L (ref 22–32)
Calcium: 9.3 mg/dL (ref 8.9–10.3)
Chloride: 104 mmol/L (ref 98–111)
Creatinine, Ser: 0.77 mg/dL (ref 0.44–1.00)
GFR calc Af Amer: >60 mL/min (ref >60)
GFR calc non Af Amer: >60 mL/min (ref >60)
Glucose, Bld: 123 mg/dL — ABNORMAL HIGH (ref 70–99)
Potassium: 4.1 mmol/L (ref 3.5–5.1)
Sodium: 139 mmol/L (ref 135–145)

## 2018-05-25 LAB — CBC
HCT: 33.2 % — ABNORMAL LOW (ref 36.0–46.0)
Hemoglobin: 10 g/dL — ABNORMAL LOW (ref 12.0–15.0)
MCH: 26.5 pg (ref 26.0–34.0)
MCHC: 30.1 g/dL (ref 30.0–36.0)
MCV: 88.1 fL (ref 80.0–100.0)
Platelets: 277 10*3/uL (ref 150–400)
RBC: 3.77 MIL/uL — ABNORMAL LOW (ref 3.87–5.11)
RDW: 14.6 % (ref 11.5–15.5)
WBC: 6.6 10*3/uL (ref 4.0–10.5)
nRBC: 0 % (ref 0.0–0.2)

## 2018-05-25 MED ORDER — SENNA 8.6 MG PO TABS: 1.0000 | ORAL_TABLET | Freq: Every day | ORAL | 0 refills | Status: DC

## 2018-05-25 MED ORDER — OXYCODONE HCL 5 MG PO TABS: 5.0000 mg | ORAL_TABLET | ORAL | 0 refills | Status: DC | PRN

## 2018-05-25 MED ORDER — IBUPROFEN 600 MG PO TABS: 600.0000 mg | ORAL_TABLET | Freq: Four times a day (QID) | ORAL | 0 refills | Status: DC | PRN

## 2018-05-25 NOTE — Discharge Summary (Signed)
Physician Discharge Summary  Patient ID: Brenda Alexander MRN: [id number] DOB/AGE: 82-Jan-1934 82 y.o.  Admit date: 05/24/2018 Discharge date: 05/25/2018  Admission Diagnoses: Endometrial cancer [facility])  Discharge Diagnoses:  Principal Problem:   Endometrial cancer [facility])   Discharged Condition: good  Hospital Course:  1/ patient was admitted on 05/24/18 for robotic assisted total hysterectomy, BSO, SLN biopsy for grade 1 endometrial cancer. 2/ surgery was uncomplicated  3/ on postoperative day 1 the patient was meeting discharge criteria: tolerating PO, voiding urine, ambulating, pain well controlled on oral medications.  4/ new medications on discharge include senakot, ibuprofen, oxycodone.  Consults: None  Significant Diagnostic Studies: labs:  CBC    Component Value Date/Time   WBC 6.6 05/25/2018 0516   RBC 3.77 (L) 05/25/2018 0516   HGB 10.0 (L) 05/25/2018 0516   HGB 8.4 (L) 04/07/2018 0945   HCT 33.2 (L) 05/25/2018 0516   HCT 26.8 (L) 04/07/2018 0945   PLT 277 05/25/2018 0516   PLT 282 04/07/2018 0945   MCV 88.1 05/25/2018 0516   MCV 84 04/07/2018 0945   MCH 26.5 05/25/2018 0516   MCHC 30.1 05/25/2018 0516   RDW 14.6 05/25/2018 0516   RDW 15.4 04/07/2018 0945   LYMPHSABS 1.3 01/11/2018 1801   MONOABS 0.5 01/11/2018 1801   EOSABS 0.1 01/11/2018 1801   BASOSABS 0.0 01/11/2018 1801   BMET    Component Value Date/Time   NA 139 05/25/2018 0516   K 4.1 05/25/2018 0516   CL 104 05/25/2018 0516   CO2 27 05/25/2018 0516   GLUCOSE 123 (H) 05/25/2018 0516   BUN 12 05/25/2018 0516   CREATININE 0.77 05/25/2018 0516   CALCIUM 9.3 05/25/2018 0516   GFRNONAA >60 05/25/2018 0516   GFRAA >60 05/25/2018 0516     Treatments: surgery: see above  Discharge Exam: Blood pressure (!) 176/78, pulse 69, temperature 98.5 F (36.9 C), temperature source Oral, resp. rate 18, height 4\' 11"  (1.499 m), weight 180 lb (81.6 kg), SpO2 100 %. General appearance: alert and cooperative GI:  soft, non-tender; bowel sounds normal; no masses,  no organomegaly Incision/Wound: in tact x 5 with no bleeding or drainage  Disposition: Discharge disposition: 01-Home or Self Care       Discharge Instructions    (HEART FAILURE PATIENTS) Call MD:  Anytime you have any of the following symptoms: 1) 3 pound weight gain in 24 hours or 5 pounds in 1 week 2) shortness of breath, with or without a dry hacking cough 3) swelling in the hands, feet or stomach 4) if you have to sleep on extra pillows at night in order to breathe.   Complete by:  As directed    Call MD for:  difficulty breathing, headache or visual disturbances   Complete by:  As directed    Call MD for:  extreme fatigue   Complete by:  As directed    Call MD for:  hives   Complete by:  As directed    Call MD for:  persistant dizziness or light-headedness   Complete by:  As directed    Call MD for:  persistant nausea and vomiting   Complete by:  As directed    Call MD for:  redness, tenderness, or signs of infection (pain, swelling, redness, odor or green/yellow discharge around incision site)   Complete by:  As directed    Call MD for:  severe uncontrolled pain   Complete by:  As directed    Call MD for:  temperature >  100.4   Complete by:  As directed    Diet - low sodium heart healthy   Complete by:  As directed    Diet general   Complete by:  As directed    Driving Restrictions   Complete by:  As directed    No driving for 7 days or until off narcotic pain medication   Increase activity slowly   Complete by:  As directed    Remove dressing in 24 hours   Complete by:  As directed    Sexual Activity Restrictions   Complete by:  As directed    No intercourse for 6 weeks     Allergies as of 05/25/2018      Reactions   Other Anaphylaxis, Swelling   Walnuts = Throat swells   Penicillins Swelling, Rash   Has patient had a PCN reaction causing immediate rash, facial/tongue/throat swelling, SOB or lightheadedness  with hypotension: Yes Has patient had a PCN reaction causing severe rash involving mucus membranes or skin necrosis: Unk Has patient had a PCN reaction that required hospitalization: Unk Has patient had a PCN reaction occurring within the last 10 years: No If all of the above answers are "NO", then may proceed with Cephalosporin use.      Medication List    STOP taking these medications   HYDROcodone-acetaminophen 5-325 MG tablet Commonly known as:  NORCO/VICODIN     TAKE these medications   acetaminophen 500 MG tablet Commonly known as:  TYLENOL Take 1,000 mg by mouth every 6 (six) hours as needed (for pain).   amLODipine 5 MG tablet Commonly known as:  NORVASC Take 5 mg by mouth daily.   CALCIUM + D3 PO Take 1 tablet by mouth daily.   cloNIDine 0.2 MG tablet Commonly known as:  CATAPRES Take 0.2 mg by mouth 2 (two) times daily.   cycloSPORINE 0.05 % ophthalmic emulsion Commonly known as:  RESTASIS Place 1 drop into both eyes 2 (two) times daily.   docusate sodium 100 MG capsule Commonly known as:  COLACE Take 1 capsule (100 mg total) by mouth 2 (two) times daily as needed for mild constipation or moderate constipation.   enalapril 20 MG tablet Commonly known as:  VASOTEC Take 20 mg by mouth 2 (two) times daily.   ferrous sulfate 325 (65 FE) MG EC tablet Take 1 tablet (325 mg total) by mouth 2 (two) times daily.   furosemide 40 MG tablet Commonly known as:  LASIX Take 40 mg by mouth daily.   ibuprofen 600 MG tablet Commonly known as:  ADVIL,MOTRIN Take 1 tablet (600 mg total) by mouth every 6 (six) hours as needed.   isosorbide mononitrate 20 MG tablet Commonly known as:  ISMO,MONOKET Take 20 mg by mouth daily.   metoprolol succinate 50 MG 24 hr tablet Commonly known as:  TOPROL-XL Take 50 mg by mouth daily.   oxyCODONE 5 MG immediate release tablet Commonly known as:  Oxy IR/ROXICODONE Take 1-2 tablets (5-10 mg total) by mouth every 4 (four) hours as  needed for moderate pain.   potassium chloride SA 20 MEQ tablet Commonly known as:  K-DUR,KLOR-CON Take 20 mEq by mouth daily.   senna 8.6 MG Tabs tablet Commonly known as:  SENOKOT Take 1 tablet (8.6 mg total) by mouth at bedtime.   simvastatin 20 MG tablet Commonly known as:  ZOCOR Take 20 mg by mouth at bedtime.   vitamin C 500 MG tablet Commonly known as:  ASCORBIC ACID Take 500  mg by mouth daily.      Follow-up Information    [name], MD Follow up in 3 week(s).   Specialty:  Gynecologic Oncology Contact information: [place] [address] [phone]           Signed: Thereasa Solo 05/25/2018, 7:25 AM

## 2018-05-25 NOTE — Progress Notes (Signed)
Discharge instructions given to pt and all questions were answered. Pt taken down via wheelchair and was picked up by her daughter.  

## 2018-06-03 ENCOUNTER — Telehealth: Payer: Self-pay

## 2018-06-03 NOTE — Telephone Encounter (Signed)
Daughter Michela Pitcher called back- report of surg path given- see previous nurse's note.  Voiced understanding.  Reports pt is doing well, no pain. Has f/u appt next Friday.  No other needs per pt's daughter at this time.

## 2018-06-03 NOTE — Telephone Encounter (Signed)
Outgoing call to pt's daughter regarding pt's results of surg path " nods negative, no radiation or chemo needed, and nothing else needed." No answer, left VM to call our office.

## 2018-06-11 ENCOUNTER — Inpatient Hospital Stay: Payer: Medicare HMO | Attending: Gynecologic Oncology | Admitting: Gynecologic Oncology

## 2018-06-11 ENCOUNTER — Encounter: Payer: Self-pay | Admitting: Gynecologic Oncology

## 2018-06-11 VITALS — BP 139/59 | HR 72 | Temp 98.3°F | Resp 20 | Ht 59.0 in | Wt 193.0 lb

## 2018-06-11 DIAGNOSIS — Z90722 Acquired absence of ovaries, bilateral: Secondary | ICD-10-CM | POA: Diagnosis not present

## 2018-06-11 DIAGNOSIS — C55 Malignant neoplasm of uterus, part unspecified: Secondary | ICD-10-CM

## 2018-06-11 DIAGNOSIS — Z90710 Acquired absence of both cervix and uterus: Secondary | ICD-10-CM | POA: Insufficient documentation

## 2018-06-11 DIAGNOSIS — Z7189 Other specified counseling: Secondary | ICD-10-CM

## 2018-06-11 DIAGNOSIS — C541 Malignant neoplasm of endometrium: Secondary | ICD-10-CM | POA: Diagnosis not present

## 2018-06-11 NOTE — Progress Notes (Signed)
Consult Note: Gyn-Onc  Consult was requested by Dr. Harolyn Rutherford for the evaluation of Brenda Alexander 84 y.o. female  CC:  Chief Complaint  Patient presents with  . Endometrioid adenocarcinoma of uterus [facility])    Assessment/Plan:  Brenda Alexander  is a 84 y.o.  year old with stage IA grade 1 endometrial adenocarcinoma s/p hysterectomy, BSO, SLN biopsy on 05/24/18. MMR normal, MSI stable.  Pathology revealed low risk factors for recurrence, therefore no adjuvant therapy is recommended according to NCCN guidelines.  I discussed risk for recurrence and typical symptoms encouraged her to notify us of these should they develop between visits.  I recommend she have follow-up every 6 months for 5 years in accordance with NCCN guidelines. Those visits should include symptom assessment, physical exam and pelvic examination. Pap smears are not indicated or recommended in the routine surveillance of endometrial cancer.  She will return to see me in May, 2020 and Dr Harolyn Rutherford in November, 2020.   HPI: Brenda Alexander is a 84 year old P5 who is seen in consultation at the request of Dr Harolyn Rutherford for grade 1 endometrial cancer.  Patient reports light postmenopausal bleeding since the summer 2019.  Initially it was felt that this was hematuria and she was seen with a CT scan on January 12, 2018 which revealed a market enlargement of the endometrial canal with a 3.2 cm thickness.  There were no adnexal masses.  A 2.0 x 3.4 cm focal area of the bladder wall thickening was identified on the CT scan.  She then saw Dr. Lovena Neighbours, urologist, who performed cystoscopy, which per patient was unremarkable and did not show any lesion within the bladder.  She was seen by her gynecologist, Dr.Anyanwu, who performed ultrasound imaging on January 21, 2018 which revealed a uterus measuring 9.6 x 5.1 x 6.4 cm with no fibroids or other masses.  The endometrium was very thick at 3.1 cm.  Had a heterogeneous appearance.  The ovaries bilaterally were  normal.  Of note a Pap smear was normal with negative high-risk HPV in June or 2019.  Ca1 25 was drawn in June 2019 which was also normal 16.  And CEA was normal at 1.3.  The patient underwent a D&C procedure on May 06, 2018 with endometrial pathology revealing FIGO grade 1 endometrial adenocarcinoma.  The patient is somewhat frail and uses a walker for reasons of arthritis.  However she does not have many discrete medical comorbidities.  She carries a diagnosis in her chart of coronary artery disease, however the patient has no chest pain on exertion or cardiac symptoms.  She has no history of a myocardial infarction or angina.  She is never had a coronary perfusion study or catheterization procedure.  She is never had a CVA or other sequelae of peripheral vascular disease.  Patient has history of diabetes mellitus for which she does not take medication as is no longer needed.  She does have hyperlipidemia and hypertension.  She has had 5 prior vaginal deliveries.  She has had no prior abdominal surgeries.  Interval Hx:   On May 16, 2018 she underwent a robotic assisted total hysterectomy with BSO and sentinel lymph node biopsy.  Intraoperative findings were significant for a 10 cm slightly bulky fibroid uterus with a grossly normal bladder including at the posterior base and trigone, normal tubes and ovaries, no suspicious nodes.  Surgery was uncomplicated as was her postop recovery.  Postoperative pathology revealed a FIGO grade 1 endometrioid endometrial adenocarcinoma  with minimal myometrial invasion (less than 50% myometrial invasion identified, no cervical stromal involvement and no LVSI present.  Sentinel lymph nodes adnexa and cervix were negative.  The tumor was negative for MMR and MSI testing.  She was deemed to have a low risk tumor for recurrence and therefore no adjuvant therapy was recommended in accordance with NCCN guidelines.  Current Meds:  Outpatient Encounter  Medications as of 06/11/2018  Medication Sig  . acetaminophen (TYLENOL) 500 MG tablet Take 1,000 mg by mouth every 6 (six) hours as needed (for pain).   Marland Kitchen amLODipine (NORVASC) 5 MG tablet Take 5 mg by mouth daily.  . Calcium Carb-Cholecalciferol (CALCIUM + D3 PO) Take 1 tablet by mouth daily.  . cloNIDine (CATAPRES) 0.2 MG tablet Take 0.2 mg by mouth 2 (two) times daily.  . cycloSPORINE (RESTASIS) 0.05 % ophthalmic emulsion Place 1 drop into both eyes 2 (two) times daily.  Marland Kitchen docusate sodium (COLACE) 100 MG capsule Take 1 capsule (100 mg total) by mouth 2 (two) times daily as needed for mild constipation or moderate constipation.  . enalapril (VASOTEC) 20 MG tablet Take 20 mg by mouth 2 (two) times daily.  . ferrous sulfate 325 (65 FE) MG EC tablet Take 1 tablet (325 mg total) by mouth 2 (two) times daily.  . furosemide (LASIX) 40 MG tablet Take 40 mg by mouth daily.  Marland Kitchen ibuprofen (ADVIL,MOTRIN) 600 MG tablet Take 1 tablet (600 mg total) by mouth every 6 (six) hours as needed.  . isosorbide mononitrate (ISMO,MONOKET) 20 MG tablet Take 20 mg by mouth daily.  . metoprolol succinate (TOPROL-XL) 50 MG 24 hr tablet Take 50 mg by mouth daily.  Marland Kitchen oxyCODONE (OXY IR/ROXICODONE) 5 MG immediate release tablet Take 1-2 tablets (5-10 mg total) by mouth every 4 (four) hours as needed for moderate pain.  . potassium chloride SA (K-DUR,KLOR-CON) 20 MEQ tablet Take 20 mEq by mouth daily.  Marland Kitchen senna (SENOKOT) 8.6 MG TABS tablet Take 1 tablet (8.6 mg total) by mouth at bedtime.  . simvastatin (ZOCOR) 20 MG tablet Take 20 mg by mouth at bedtime.   . vitamin C (ASCORBIC ACID) 500 MG tablet Take 500 mg by mouth daily.   No facility-administered encounter medications on file as of 06/11/2018.     Allergy:  Allergies  Allergen Reactions  . Other Anaphylaxis and Swelling    Walnuts = Throat swells  . Penicillins Swelling and Rash    Has patient had a PCN reaction causing immediate rash, facial/tongue/throat  swelling, SOB or lightheadedness with hypotension: Yes Has patient had a PCN reaction causing severe rash involving mucus membranes or skin necrosis: Unk Has patient had a PCN reaction that required hospitalization: Unk Has patient had a PCN reaction occurring within the last 10 years: No If all of the above answers are "NO", then may proceed with Cephalosporin use.     Social Hx:   Social History   Socioeconomic History  . Marital status: Widowed    Spouse name: Not on file  . Number of children: Not on file  . Years of education: Not on file  . Highest education level: Not on file  Occupational History  . Not on file  Social Needs  . Financial resource strain: Not on file  . Food insecurity:    Worry: Not on file    Inability: Not on file  . Transportation needs:    Medical: Not on file    Non-medical: Not on file  Tobacco Use  .  Smoking status: Never Smoker  . Smokeless tobacco: Never Used  Substance and Sexual Activity  . Alcohol use: Never    Frequency: Never  . Drug use: Never  . Sexual activity: Not Currently    Partners: Male    Birth control/protection: Post-menopausal  Lifestyle  . Physical activity:    Days per week: Not on file    Minutes per session: Not on file  . Stress: Not on file  Relationships  . Social connections:    Talks on phone: Not on file    Gets together: Not on file    Attends religious service: Not on file    Active member of club or organization: Not on file    Attends meetings of clubs or organizations: Not on file    Relationship status: Not on file  . Intimate partner violence:    Fear of current or ex partner: Not on file    Emotionally abused: Not on file    Physically abused: Not on file    Forced sexual activity: Not on file  Other Topics Concern  . Not on file  Social History Narrative  . Not on file    Past Surgical Hx:  Past Surgical History:  Procedure Laterality Date  . COLONOSCOPY    . EYE SURGERY      cataracts removed right eye  . HYSTEROSCOPY W/D&C N/A 05/06/2018   Procedure: DILATATION AND CURETTAGE /HYSTEROSCOPY;  Surgeon: [name], MD;  Location: [facility];  Service: Gynecology;  Laterality: N/A;  . MULTIPLE TOOTH EXTRACTIONS    . ROBOTIC ASSISTED TOTAL HYSTERECTOMY WITH BILATERAL SALPINGO OOPHERECTOMY Bilateral 05/24/2018   Procedure: XI ROBOTIC ASSISTED TOTAL HYSTERECTOMY WITH BILATERAL SALPINGO OOPHORECTOMY;  Surgeon: [name], MD;  Location: [name];  Service: Gynecology;  Laterality: Bilateral;  . SENTINEL NODE BIOPSY N/A 05/24/2018   Procedure: SENTINEL LYMP  NODE BIOPSY;  Surgeon: [name], MD;  Location: [name];  Service: Gynecology;  Laterality: N/A;    Past Medical Hx:  Past Medical History:  Diagnosis Date  . Anemia   . Arthritis    knees, hands  . Coronary artery disease   . Diabetes mellitus without complication (HCC)    no meds  . Dyspnea 01/12/2018   w/anemia dx and hospital admit w/blood transfused   . Endometrioid adenocarcinoma of uterus ([place]) 05/13/2018  . History of blood transfusion 01/12/2018   at [facility]  . Hyperlipemia   . Hypertension   . SVD (spontaneous vaginal delivery)    x 6 - 5 living children  . Uses walker    rollator  . Wears partial dentures    upper and lower partials    Past Gynecological History:  See HPI  No LMP recorded (lmp unknown). Patient is postmenopausal.  Family Hx:  Family History  Problem Relation Age of Onset  . Obesity Son   . Hypertension Father   . Stroke Father     Review of Systems:  Constitutional  Feels well,    ENT Normal appearing ears and nares bilaterally Skin/Breast  No rash, sores, jaundice, itching, dryness Cardiovascular  No chest pain, shortness of breath, or edema  Pulmonary  No cough or wheeze.  Gastro Intestinal  No nausea, vomitting, or diarrhoea. No bright red blood per rectum, no abdominal pain, change in bowel movement, or constipation.  Genito Urinary  No  frequency, urgency, dysuria, no bleeding Musculo Skeletal  No myalgia, arthralgia, joint swelling or pain  Neurologic  No weakness, numbness,  change in gait,  Psychology  No depression, anxiety, insomnia.   Vitals:  Blood pressure (!) 139/59, pulse 72, temperature 98.3 F (36.8 C), temperature source Oral, resp. rate 20, height '4\' 11"'  (1.499 m), weight 193 lb (87.5 kg), SpO2 100 %.  Physical Exam: WD in NAD Neck  Supple NROM, without any enlargements.  Lymph Node Survey No cervical supraclavicular or inguinal adenopathy Cardiovascular  Pulse normal rate, regularity and rhythm. S1 and S2 normal.  Lungs  Clear to auscultation bilateraly, without wheezes/crackles/rhonchi. Good air movement.  Skin  No rash/lesions/breakdown  Psychiatry  Alert and oriented to person, place, and time  Abdomen  Normoactive bowel sounds, abdomen soft, non-tender and mildly obese without evidence of hernia. Well healed incisions.  Back No CVA tenderness Genito Urinary  Vaginal cuff well healed, in tact, no lesions. No pelvic masses Rectal  deferred Extremities  No bilateral cyanosis, clubbing or edema.   30 minutes of direct face to face counseling time was spent with the patient. This included discussion about prognosis, therapy recommendations and postoperative side effects and are beyond the scope of routine postoperative care.   Thereasa Solo, MD  06/11/2018, 2:17 PM

## 2018-06-11 NOTE — Patient Instructions (Signed)
Please notify Dr Denman George at phone number [phone] if you notice vaginal bleeding, new pelvic or abdominal pains, bloating, feeling full easy, or a change in bladder or bowel function.   Dr Denman George will see you again in May, 2020 and Dr Harolyn Rutherford will see you again in the fall of 2020.

## 2018-07-22 ENCOUNTER — Other Ambulatory Visit: Payer: Self-pay

## 2018-07-22 ENCOUNTER — Ambulatory Visit (INDEPENDENT_AMBULATORY_CARE_PROVIDER_SITE_OTHER)
Admission: EM | Admit: 2018-07-22 | Discharge: 2018-07-22 | Disposition: A | Discharge status: Home / Self Care | Payer: Medicare HMO | Source: Home / Self Care

## 2018-07-22 ENCOUNTER — Inpatient Hospital Stay (HOSPITAL_COMMUNITY): Payer: Medicare HMO

## 2018-07-22 ENCOUNTER — Emergency Department (HOSPITAL_COMMUNITY): Payer: Medicare HMO

## 2018-07-22 ENCOUNTER — Encounter (HOSPITAL_COMMUNITY): Payer: Self-pay | Admitting: Emergency Medicine

## 2018-07-22 ENCOUNTER — Encounter (HOSPITAL_COMMUNITY): Payer: Self-pay | Admitting: Family Medicine

## 2018-07-22 ENCOUNTER — Inpatient Hospital Stay (HOSPITAL_COMMUNITY)
Admission: EM | Admit: 2018-07-22 | Discharge: 2018-07-25 | DRG: 390 | Disposition: A | Discharge status: Home Health | Payer: Medicare HMO | Attending: Internal Medicine | Admitting: Internal Medicine

## 2018-07-22 DIAGNOSIS — M19042 Primary osteoarthritis, left hand: Secondary | ICD-10-CM | POA: Diagnosis present

## 2018-07-22 DIAGNOSIS — E876 Hypokalemia: Secondary | ICD-10-CM | POA: Diagnosis present

## 2018-07-22 DIAGNOSIS — K56609 Unspecified intestinal obstruction, unspecified as to partial versus complete obstruction: Secondary | ICD-10-CM | POA: Diagnosis present

## 2018-07-22 DIAGNOSIS — Z9841 Cataract extraction status, right eye: Secondary | ICD-10-CM

## 2018-07-22 DIAGNOSIS — R3129 Other microscopic hematuria: Secondary | ICD-10-CM | POA: Diagnosis present

## 2018-07-22 DIAGNOSIS — D72829 Elevated white blood cell count, unspecified: Secondary | ICD-10-CM

## 2018-07-22 DIAGNOSIS — E785 Hyperlipidemia, unspecified: Secondary | ICD-10-CM | POA: Diagnosis present

## 2018-07-22 DIAGNOSIS — Z87892 Personal history of anaphylaxis: Secondary | ICD-10-CM

## 2018-07-22 DIAGNOSIS — M19041 Primary osteoarthritis, right hand: Secondary | ICD-10-CM | POA: Diagnosis present

## 2018-07-22 DIAGNOSIS — E119 Type 2 diabetes mellitus without complications: Secondary | ICD-10-CM | POA: Diagnosis present

## 2018-07-22 DIAGNOSIS — Z972 Presence of dental prosthetic device (complete) (partial): Secondary | ICD-10-CM

## 2018-07-22 DIAGNOSIS — N329 Bladder disorder, unspecified: Secondary | ICD-10-CM | POA: Diagnosis present

## 2018-07-22 DIAGNOSIS — Z79899 Other long term (current) drug therapy: Secondary | ICD-10-CM | POA: Diagnosis not present

## 2018-07-22 DIAGNOSIS — Z0189 Encounter for other specified special examinations: Secondary | ICD-10-CM | POA: Diagnosis present

## 2018-07-22 DIAGNOSIS — Z90710 Acquired absence of both cervix and uterus: Secondary | ICD-10-CM

## 2018-07-22 DIAGNOSIS — R112 Nausea with vomiting, unspecified: Secondary | ICD-10-CM

## 2018-07-22 DIAGNOSIS — Z91018 Allergy to other foods: Secondary | ICD-10-CM | POA: Diagnosis not present

## 2018-07-22 DIAGNOSIS — I2510 Atherosclerotic heart disease of native coronary artery without angina pectoris: Secondary | ICD-10-CM | POA: Diagnosis present

## 2018-07-22 DIAGNOSIS — Z8542 Personal history of malignant neoplasm of other parts of uterus: Secondary | ICD-10-CM

## 2018-07-22 DIAGNOSIS — Z8249 Family history of ischemic heart disease and other diseases of the circulatory system: Secondary | ICD-10-CM

## 2018-07-22 DIAGNOSIS — I10 Essential (primary) hypertension: Secondary | ICD-10-CM | POA: Diagnosis present

## 2018-07-22 DIAGNOSIS — K828 Other specified diseases of gallbladder: Secondary | ICD-10-CM

## 2018-07-22 DIAGNOSIS — Z880 Allergy status to penicillin: Secondary | ICD-10-CM | POA: Diagnosis not present

## 2018-07-22 DIAGNOSIS — K08409 Partial loss of teeth, unspecified cause, unspecified class: Secondary | ICD-10-CM | POA: Diagnosis present

## 2018-07-22 DIAGNOSIS — M170 Bilateral primary osteoarthritis of knee: Secondary | ICD-10-CM | POA: Diagnosis present

## 2018-07-22 DIAGNOSIS — Z4659 Encounter for fitting and adjustment of other gastrointestinal appliance and device: Secondary | ICD-10-CM

## 2018-07-22 DIAGNOSIS — K56601 Complete intestinal obstruction, unspecified as to cause: Secondary | ICD-10-CM

## 2018-07-22 DIAGNOSIS — K5900 Constipation, unspecified: Secondary | ICD-10-CM | POA: Diagnosis present

## 2018-07-22 DIAGNOSIS — R5381 Other malaise: Secondary | ICD-10-CM

## 2018-07-22 LAB — COMPREHENSIVE METABOLIC PANEL
ALT: 20 U/L (ref 0–44)
AST: 41 U/L (ref 15–41)
Albumin: 4.5 g/dL (ref 3.5–5.0)
Alkaline Phosphatase: 82 U/L (ref 38–126)
Anion gap: 15 (ref 5–15)
BUN: 14 mg/dL (ref 8–23)
CO2: 23 mmol/L (ref 22–32)
Calcium: 10.4 mg/dL — ABNORMAL HIGH (ref 8.9–10.3)
Chloride: 98 mmol/L (ref 98–111)
Creatinine, Ser: 0.9 mg/dL (ref 0.44–1.00)
GFR calc Af Amer: >60 mL/min (ref >60)
GFR calc non Af Amer: 59 mL/min — ABNORMAL LOW (ref >60)
Glucose, Bld: 167 mg/dL — ABNORMAL HIGH (ref 70–99)
Potassium: 3.1 mmol/L — ABNORMAL LOW (ref 3.5–5.1)
Sodium: 136 mmol/L (ref 135–145)
Total Bilirubin: 0.7 mg/dL (ref 0.3–1.2)
Total Protein: 8 g/dL (ref 6.5–8.1)

## 2018-07-22 LAB — URINALYSIS, ROUTINE W REFLEX MICROSCOPIC
Bilirubin Urine: NEGATIVE
Glucose, UA: 150 mg/dL — AB
Ketones, ur: 20 mg/dL — AB
Leukocytes, UA: NEGATIVE
Nitrite: NEGATIVE
Protein, ur: 100 mg/dL — AB
Specific Gravity, Urine: 1.019 (ref 1.005–1.030)
pH: 5 (ref 5.0–8.0)

## 2018-07-22 LAB — CBC
HCT: 44.4 % (ref 36.0–46.0)
Hemoglobin: 13.7 g/dL (ref 12.0–15.0)
MCH: 25.1 pg — ABNORMAL LOW (ref 26.0–34.0)
MCHC: 30.9 g/dL (ref 30.0–36.0)
MCV: 81.3 fL (ref 80.0–100.0)
Platelets: 243 10*3/uL (ref 150–400)
RBC: 5.46 MIL/uL — ABNORMAL HIGH (ref 3.87–5.11)
RDW: 15.4 % (ref 11.5–15.5)
WBC: 10.1 10*3/uL (ref 4.0–10.5)
nRBC: 0 % (ref 0.0–0.2)

## 2018-07-22 LAB — I-STAT TROPONIN, ED: Troponin i, poc: 0.02 ng/mL (ref 0.00–0.08)

## 2018-07-22 LAB — LIPASE, BLOOD: Lipase: 26 U/L (ref 11–51)

## 2018-07-22 LAB — MAGNESIUM: Magnesium: 1.9 mg/dL (ref 1.7–2.4)

## 2018-07-22 MED ORDER — ONDANSETRON 4 MG PO TBDP
4.0000 mg | ORAL_TABLET | Freq: Once | ORAL | Status: AC | PRN
  Administered 2018-07-22: 4 mg via ORAL
  Filled 2018-07-22: qty 1

## 2018-07-22 MED ORDER — CYCLOSPORINE 0.05 % OP EMUL
1.0000 [drp] | Freq: Two times a day (BID) | OPHTHALMIC | Status: DC
  Administered 2018-07-22 – 2018-07-25 (×6): 1 [drp] via OPHTHALMIC
  Filled 2018-07-22 (×7): qty 1

## 2018-07-22 MED ORDER — HEPARIN SODIUM (PORCINE) 5000 UNIT/ML IJ SOLN
5000.0000 [IU] | Freq: Three times a day (TID) | INTRAMUSCULAR | Status: DC
  Administered 2018-07-22 – 2018-07-25 (×7): 5000 [IU] via SUBCUTANEOUS
  Filled 2018-07-22 (×7): qty 1

## 2018-07-22 MED ORDER — SODIUM CHLORIDE 0.9 % IV BOLUS
500.0000 mL | Freq: Once | INTRAVENOUS | Status: AC
  Administered 2018-07-22: 500 mL via INTRAVENOUS

## 2018-07-22 MED ORDER — DIPHENHYDRAMINE HCL 50 MG/ML IJ SOLN
25.0000 mg | Freq: Every evening | INTRAMUSCULAR | Status: DC | PRN
  Filled 2018-07-22: qty 1

## 2018-07-22 MED ORDER — RAMELTEON 8 MG PO TABS
8.0000 mg | ORAL_TABLET | Freq: Every evening | ORAL | Status: DC | PRN
  Filled 2018-07-22: qty 1

## 2018-07-22 MED ORDER — HYDRALAZINE HCL 20 MG/ML IJ SOLN
5.0000 mg | INTRAMUSCULAR | Status: DC | PRN
  Administered 2018-07-22 – 2018-07-24 (×6): 5 mg via INTRAVENOUS
  Filled 2018-07-22 (×6): qty 1

## 2018-07-22 MED ORDER — IOHEXOL 300 MG/ML  SOLN
100.0000 mL | Freq: Once | INTRAMUSCULAR | Status: AC | PRN
  Administered 2018-07-22: 100 mL via INTRAVENOUS

## 2018-07-22 MED ORDER — ONDANSETRON HCL 4 MG/2ML IJ SOLN: 4.0000 mg | Freq: Four times a day (QID) | INTRAMUSCULAR | Status: DC | PRN

## 2018-07-22 MED ORDER — POTASSIUM CHLORIDE IN NACL 40-0.9 MEQ/L-% IV SOLN
INTRAVENOUS | Status: DC
  Administered 2018-07-22: 125 mL/h via INTRAVENOUS
  Administered 2018-07-23: 100 mL/h via INTRAVENOUS
  Administered 2018-07-23: 125 mL/h via INTRAVENOUS
  Administered 2018-07-24: 20 mL/h via INTRAVENOUS
  Filled 2018-07-22 (×5): qty 1000

## 2018-07-22 NOTE — ED Notes (Signed)
Vomiting, dr Joseph Art notified

## 2018-07-22 NOTE — Consult Note (Signed)
Surgical Consultation Requesting provider: Dr. Ralene Bathe  CC: vomiting  HPI: 84yo woman with history of diabetes, coronary artery disease, hypertension, hyperlipidemia, use of a walker, who presents to the emergency department complaining of vomiting and nausea which began yesterday evening. Reports numerous episodes of nonbloody, nonbilious emesis. Emesis is now just white foam. Also describes left lower quadrant pain that began at the time of her vomiting. She does have a history of constipation and her last bowel movement was 2 days ago. Continues to pass flatus. Denies any fevers or dysuria. She does have a history of robotic-assisted total hysterectomy/ BSO 2 months ago for endometrial cancer grade 1. No other abdominal surgery.      Allergies  Allergen Reactions  . Other Anaphylaxis and Swelling    Walnuts = Throat swells  . Penicillins Swelling and Rash    Has patient had a PCN reaction causing immediate rash, facial/tongue/throat swelling, SOB or lightheadedness with hypotension: Yes Has patient had a PCN reaction causing severe rash involving mucus membranes or skin necrosis: Unk Has patient had a PCN reaction that required hospitalization: Unk Has patient had a PCN reaction occurring within the last 10 years: No If all of the above answers are "NO", then may proceed with Cephalosporin use.     Past Medical History:  Diagnosis Date  . Anemia   . Arthritis    knees, hands  . Coronary artery disease   . Diabetes mellitus without complication (HCC)    no meds  . Dyspnea 01/12/2018   w/anemia dx and hospital admit w/blood transfused   . 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 History of blood transfusion 01/12/2018   at [facility]  . Hyperlipemia   . Hypertension   . SVD (spontaneous vaginal delivery)    x 6 - 5 living children  . Uses walker    rollator  . Wears partial dentures    upper and lower partials    Past Surgical History:  Procedure  Laterality Date  . COLONOSCOPY    . EYE SURGERY     cataracts removed right eye  . HYSTEROSCOPY W/D&C N/A 05/06/2018   Procedure: DILATATION AND CURETTAGE /HYSTEROSCOPY;  Surgeon: [name], MD;  Location: [facility];  Service: Gynecology;  Laterality: N/A;  . MULTIPLE TOOTH EXTRACTIONS    . ROBOTIC ASSISTED TOTAL HYSTERECTOMY WITH BILATERAL SALPINGO OOPHERECTOMY Bilateral 05/24/2018   Procedure: XI ROBOTIC ASSISTED TOTAL HYSTERECTOMY WITH BILATERAL SALPINGO OOPHORECTOMY;  Surgeon: [name], MD;  Location: [name];  Service: Gynecology;  Laterality: Bilateral;  . SENTINEL NODE BIOPSY N/A 05/24/2018   Procedure: SENTINEL LYMP  NODE BIOPSY;  Surgeon: [name], MD;  Location: [name];  Service: Gynecology;  Laterality: N/A;    Family History  Problem Relation Age of Onset  . Obesity Son   . Hypertension Father   . Stroke Father     Social History   Socioeconomic History  . Marital status: Widowed    Spouse name: Not on file  . Number of children: Not on file  . Years of education: Not on file  . Highest education level: Not on file  Occupational History  . Not on file  Social Needs  . Financial resource strain: Not on file  . Food insecurity:    Worry: Not on file    Inability: Not on file  . Transportation needs:    Medical: Not on file    Non-medical: Not on file  Tobacco Use  . Smoking status:  Never Smoker  . Smokeless tobacco: Never Used  Substance and Sexual Activity  . Alcohol use: Never    Frequency: Never  . Drug use: Never  . Sexual activity: Not Currently    Partners: Male    Birth control/protection: Post-menopausal  Lifestyle  . Physical activity:    Days per week: Not on file    Minutes per session: Not on file  . Stress: Not on file  Relationships  . Social connections:    Talks on phone: Not on file    Gets together: Not on file    Attends religious service: Not on file    Active member of club or organization: Not on file    Attends  meetings of clubs or organizations: Not on file    Relationship status: Not on file  Other Topics Concern  . Not on file  Social History Narrative  . Not on file    No current facility-administered medications on file prior to encounter.    Current Outpatient Medications on File Prior to Encounter  Medication Sig Dispense Refill  . acetaminophen (TYLENOL) 500 MG tablet Take 1,000 mg by mouth every 6 (six) hours as needed (for pain).     Marland Kitchen amLODipine (NORVASC) 5 MG tablet Take 5 mg by mouth daily.    . Calcium Carb-Cholecalciferol (CALCIUM + D3 PO) Take 1 tablet by mouth daily.    . cloNIDine (CATAPRES) 0.2 MG tablet Take 0.2 mg by mouth 2 (two) times daily.    . cycloSPORINE (RESTASIS) 0.05 % ophthalmic emulsion Place 1 drop into both eyes 2 (two) times daily.    . enalapril (VASOTEC) 20 MG tablet Take 20 mg by mouth 2 (two) times daily.  4  . ferrous sulfate 325 (65 FE) MG EC tablet Take 1 tablet (325 mg total) by mouth 2 (two) times daily. 60 tablet 3  . furosemide (LASIX) 40 MG tablet Take 40 mg by mouth daily.    . isosorbide mononitrate (ISMO,MONOKET) 20 MG tablet Take 20 mg by mouth daily.  4  . metoprolol succinate (TOPROL-XL) 50 MG 24 hr tablet Take 50 mg by mouth daily.  2  . potassium chloride SA (K-DUR,KLOR-CON) 20 MEQ tablet Take 20 mEq by mouth daily.    . simvastatin (ZOCOR) 20 MG tablet Take 20 mg by mouth at bedtime.     . vitamin C (ASCORBIC ACID) 500 MG tablet Take 500 mg by mouth daily.    Marland Kitchen docusate sodium (COLACE) 100 MG capsule Take 1 capsule (100 mg total) by mouth 2 (two) times daily as needed for mild constipation or moderate constipation. (Patient not taking: Reported on 07/22/2018) 30 capsule 2    Review of Systems: a complete, 10pt review of systems was completed with pertinent positives and negatives as documented in the HPI  Physical Exam: Vitals:   07/22/18 1645 07/22/18 1715  BP: (!) 174/83 (!) 174/89  Pulse: 89 85  Resp: 20   Temp: 98 F (36.7 C)    SpO2: 95% 97%   Gen: A&Ox3, no distress  Head: normocephalic, atraumatic Eyes: extraocular motions intact, anicteric.  Neck: supple without mass or thyromegaly Chest: unlabored respirations, symmetrical air entry, clear bilaterally   Cardiovascular: RRR with palpable distal pulses, no pedal edema Abdomen: soft, mildly distended, mildly tender llq. No peritoneal signs. No mass or organomegaly.  Extremities: warm, without edema, no deformities  Neuro: grossly intact Psych: appropriate mood and affect, normal insight  Skin: warm and dry   CBC Latest  Ref Rng & Units 07/22/2018 05/25/2018 05/19/2018  WBC 4.0 - 10.5 K/uL 10.1 6.6 4.4  Hemoglobin 12.0 - 15.0 g/dL 13.7 10.0(L) 9.5(L)  Hematocrit 36.0 - 46.0 % 44.4 33.2(L) 32.8(L)  Platelets 150 - 400 K/uL 243 277 278    CMP Latest Ref Rng & Units 07/22/2018 05/25/2018 05/19/2018  Glucose 70 - 99 mg/dL 167(H) 123(H) 113(H)  BUN 8 - 23 mg/dL 14 12 16   Creatinine 0.44 - 1.00 mg/dL 0.90 0.77 0.68  Sodium 135 - 145 mmol/L 136 139 139  Potassium 3.5 - 5.1 mmol/L 3.1(L) 4.1 3.8  Chloride 98 - 111 mmol/L 98 104 104  CO2 22 - 32 mmol/L 23 27 28   Calcium 8.9 - 10.3 mg/dL 10.4(H) 9.3 9.2  Total Protein 6.5 - 8.1 g/dL 8.0 - 7.1  Total Bilirubin 0.3 - 1.2 mg/dL 0.7 - 0.7  Alkaline Phos 38 - 126 U/L 82 - 64  AST 15 - 41 U/L 41 - 18  ALT 0 - 44 U/L 20 - 10    Lab Results  Component Value Date   INR 0.98 01/11/2018    Imaging: Ct Abdomen Pelvis W Contrast  Result Date: 07/22/2018 CLINICAL DATA:  Abdominal pain and emesis EXAM: CT ABDOMEN AND PELVIS WITH CONTRAST TECHNIQUE: Multidetector CT imaging of the abdomen and pelvis was performed using the standard protocol following bolus administration of intravenous contrast. CONTRAST:  124mL OMNIPAQUE IOHEXOL 300 MG/ML  SOLN COMPARISON:  January 12, 2018 FINDINGS: Lower chest: There is mild posterior right base atelectasis. Lung bases otherwise are clear. There are foci of coronary artery  calcification. There is a small hiatal hernia. Hepatobiliary: No focal liver lesions are appreciated. There is evidence of porcelain gallbladder with diffuse calcification of the wall of the gallbladder. The gallbladder is contracted. No biliary duct dilatation is evident. Pancreas: There is no appreciable pancreatic mass or inflammatory focus. Pancreas is mildly atrophic. Spleen: No splenic lesions are evident. Adrenals/Urinary Tract: Adrenals bilaterally appear unremarkable. There is a cyst arising from the posterior mid right kidney measuring 1.0 x 1.0 cm. There is a cyst arising from the lateral upper pole left kidney measuring 3.8 x 3.6 cm. A cyst arising from the posterior upper to mid left kidney measures 2.7 x 2.7 cm. There is no appreciable hydronephrosis on either side. There is no evident renal or ureteral calculus on either side. Urinary bladder is partially decompressed. There is asymmetric thickening of the anterior urinary bladder wall with a questionable mass occupying the anterior urinary bladder. Stomach/Bowel: There is dilatation of the stomach diffusely. Stomach is filled with fluid and air. There is dilatation of most of the small bowel loops with a transition zone in the mid ileal region consistent with a degree of small bowel obstruction. There is no evident free air or portal venous air. Vascular/Lymphatic: There is aortoiliac atherosclerosis. No aneurysm is evident. There is patchy calcification in major mesenteric arterial vessels without major mesenteric arterial vessel obstruction. No adenopathy is appreciable in the abdomen or pelvis. Reproductive: Uterus is absent.  No pelvic mass evident. Other: There is no periappendiceal region inflammatory change. There is no abscess in the abdomen or pelvis. There is focal ascites in the dependent portion of the pelvis. Musculoskeletal: There is degenerative change in the lower thoracic and lumbar region. There is vacuum phenomenon at multiple  levels. There are no blastic or lytic bone lesions. There is moderate spinal stenosis at L4-5 due to diffuse disc protrusion and bony hypertrophy. There are no intramuscular or  abdominal wall lesions evident. IMPRESSION: 1. Small bowel obstruction at the mid ileal level. No free air. No diverticulitis evident. 2. No abscess evident in the abdomen or pelvis. Relatively mild ascites in the dependent portion the pelvis may be sympathetic in response to the small-bowel obstruction. 3. Suspect mass arising in the anterior urinary bladder wall. Urinary bladder is largely decompressed, and this area is difficult to evaluate with certainty. This finding is felt to warrant urinalysis and urologic consultation. 4. Apparent porcelain gallbladder. Porcelain gallbladder is a risk factor for gallbladder carcinoma development. 5 There is aortoiliac artery atherosclerosis. There is also mesenteric and coronary artery calcifications. 6.  Uterus absent. 7. Moderate spinal stenosis at L4-5, multifactorial. Multilevel lumbar arthropathy. 8.  Small hiatal hernia. Electronically Signed   By: Lowella Grip III M.D.   On: 07/22/2018 17:51    A/P: 84yo woman with pSBO. Incidentally noted porcelain gallbladder, possible anterior bladder mass, atherosclerosis, mesenteric and coronary artery calcifications.  Recommend admission for NG decompression, resuscitation, symptom control, and will start SBO protocol tomorrow morning.  No surgical plans for the gallbladder appearance.  Workup possible bladder mass per primary team  Will continue to follow   Romana Juniper, MD [facility], [place] Pager [phone]

## 2018-07-22 NOTE — Discharge Instructions (Addendum)
You are too sick and weak for [place] to treat you here.

## 2018-07-22 NOTE — ED Notes (Signed)
Please note: urine culture collected in addition to UA. Both sent to Main Lab at this time.

## 2018-07-22 NOTE — H&P (Signed)
History and Physical    Brenda Alexander CNO:[id number] DOB: [birth year] DOA: 07/22/2018  PCP: Nolene Ebbs, MD Patient coming from: Home  Chief Complaint: Vomiting  HPI: Brenda Alexander is a 84 y.o. female with medical history significant of CAD, NIDDM, hypertension, hyperlipidemia, prior hysterectomy presenting to the hospital for evaluation of vomiting.  Patient reports having multiple episodes of bilious, nonbloody emesis for the past 1 day.  She has not been able to tolerate p.o. intake.  States she previously had pain in her right lower quadrant but no abdominal pain at present.  Denies having any diarrhea.  No fevers or chills.  Review of Systems: As per HPI otherwise 10 point review of systems negative.  Past Medical History:  Diagnosis Date  . Anemia   . Arthritis    knees, hands  . Coronary artery disease   . Diabetes mellitus without complication (HCC)    no meds  . Dyspnea 01/12/2018   w/anemia dx and hospital admit w/blood transfused   . 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 History of blood transfusion 01/12/2018   at [facility]  . Hyperlipemia   . Hypertension   . SVD (spontaneous vaginal delivery)    x 6 - 5 living children  . Uses walker    rollator  . Wears partial dentures    upper and lower partials    Past Surgical History:  Procedure Laterality Date  . COLONOSCOPY    . EYE SURGERY     cataracts removed right eye  . HYSTEROSCOPY W/D&C N/A 05/06/2018   Procedure: DILATATION AND CURETTAGE /HYSTEROSCOPY;  Surgeon: [name], MD;  Location: [facility];  Service: Gynecology;  Laterality: N/A;  . MULTIPLE TOOTH EXTRACTIONS    . ROBOTIC ASSISTED TOTAL HYSTERECTOMY WITH BILATERAL SALPINGO OOPHERECTOMY Bilateral 05/24/2018   Procedure: XI ROBOTIC ASSISTED TOTAL HYSTERECTOMY WITH BILATERAL SALPINGO OOPHORECTOMY;  Surgeon: [name], MD;  Location: [name];  Service: Gynecology;  Laterality: Bilateral;  . SENTINEL NODE BIOPSY N/A 05/24/2018    Procedure: SENTINEL LYMP  NODE BIOPSY;  Surgeon: [name], MD;  Location: [name];  Service: Gynecology;  Laterality: N/A;     reports that she has never smoked. She has never used smokeless tobacco. She reports that she does not drink alcohol or use drugs.  Allergies  Allergen Reactions  . Other Anaphylaxis and Swelling    Walnuts = Throat swells  . Penicillins Swelling and Rash    Has patient had a PCN reaction causing immediate rash, facial/tongue/throat swelling, SOB or lightheadedness with hypotension: Yes Has patient had a PCN reaction causing severe rash involving mucus membranes or skin necrosis: Unk Has patient had a PCN reaction that required hospitalization: Unk Has patient had a PCN reaction occurring within the last 10 years: No If all of the above answers are "NO", then may proceed with Cephalosporin use.     Family History  Problem Relation Age of Onset  . Obesity Son   . Hypertension Father   . Stroke Father     Prior to Admission medications   Medication Sig Start Date End Date Taking? Authorizing Provider  acetaminophen (TYLENOL) 500 MG tablet Take 1,000 mg by mouth every 6 (six) hours as needed (for pain).    Yes [provider]  amLODipine (NORVASC) 5 MG tablet Take 5 mg by mouth daily.   Yes [provider]  Calcium Carb-Cholecalciferol (CALCIUM + D3 PO) Take 1 tablet by mouth daily.   Yes [provider]  cloNIDine (CATAPRES) 0.2 MG tablet Take 0.2 mg by mouth 2 (two) times daily.   Yes [provider]  cycloSPORINE (RESTASIS) 0.05 % ophthalmic emulsion Place 1 drop into both eyes 2 (two) times daily.   Yes [provider]  enalapril (VASOTEC) 20 MG tablet Take 20 mg by mouth 2 (two) times daily. 12/23/17  Yes [provider]  ferrous sulfate 325 (65 FE) MG EC tablet Take 1 tablet (325 mg total) by mouth 2 (two) times daily. 01/13/18 01/13/19 Yes Elgergawy, Silver Huguenin, MD  furosemide (LASIX) 40 MG tablet  Take 40 mg by mouth daily.   Yes [provider]  isosorbide mononitrate (ISMO,MONOKET) 20 MG tablet Take 20 mg by mouth daily. 12/23/17  Yes [provider]  metoprolol succinate (TOPROL-XL) 50 MG 24 hr tablet Take 50 mg by mouth daily. 12/23/17  Yes [provider]  potassium chloride SA (K-DUR,KLOR-CON) 20 MEQ tablet Take 20 mEq by mouth daily.   Yes [provider]  simvastatin (ZOCOR) 20 MG tablet Take 20 mg by mouth at bedtime.    Yes [provider]  vitamin C (ASCORBIC ACID) 500 MG tablet Take 500 mg by mouth daily.   Yes [provider]  docusate sodium (COLACE) 100 MG capsule Take 1 capsule (100 mg total) by mouth 2 (two) times daily as needed for mild constipation or moderate constipation. Patient not taking: Reported on 07/22/2018 05/06/18   [name], MD    Physical Exam: Vitals:   07/22/18 1915 07/22/18 1930 07/22/18 1945 07/22/18 2037  BP: (!) 188/79 (!) 198/104 (!) 168/96 (!) 179/81  Pulse: 90 (!) 105 (!) 106 87  Resp:    20  Temp:    98.9 F (37.2 C)  TempSrc:    Oral  SpO2: 94% 98% 96% 97%    Physical Exam  Constitutional: She is oriented to person, place, and time. She appears well-developed and well-nourished. No distress.  HENT:  Head: Normocephalic.  Dry mucous membranes NG tube in place  Eyes: Right eye exhibits no discharge. Left eye exhibits no discharge.  Neck: Neck supple.  Cardiovascular: Normal rate, regular rhythm and intact distal pulses.  Pulmonary/Chest: Effort normal and breath sounds normal. No respiratory distress. She has no wheezes. She has no rales.  Abdominal: Soft. Bowel sounds are normal. She exhibits no distension. There is no abdominal tenderness. There is no guarding.  Musculoskeletal:        General: No edema.  Neurological: She is alert and oriented to person, place, and time.  Skin: Skin is warm and dry. She is not diaphoretic.  Psychiatric: She has a normal mood and  affect. Her behavior is normal.     Labs on Admission: I have personally reviewed following labs and imaging studies  CBC: Recent Labs  Lab 07/22/18 1520  WBC 10.1  HGB 13.7  HCT 44.4  MCV 81.3  PLT 655   Basic Metabolic Panel: Recent Labs  Lab 07/22/18 1520  NA 136  K 3.1*  CL 98  CO2 23  GLUCOSE 167*  BUN 14  CREATININE 0.90  CALCIUM 10.4*   GFR: CrCl cannot be calculated (Unknown ideal weight.). Liver Function Tests: Recent Labs  Lab 07/22/18 1520  AST 41  ALT 20  ALKPHOS 82  BILITOT 0.7  PROT 8.0  ALBUMIN 4.5   Recent Labs  Lab 07/22/18 1520  LIPASE 26   No results for input(s): AMMONIA in the last 168 hours. Coagulation Profile: No results for  input(s): INR, PROTIME in the last 168 hours. Cardiac Enzymes: No results for input(s): CKTOTAL, CKMB, CKMBINDEX, TROPONINI in the last 168 hours. BNP (last 3 results) No results for input(s): PROBNP in the last 8760 hours. HbA1C: No results for input(s): HGBA1C in the last 72 hours. CBG: No results for input(s): GLUCAP in the last 168 hours. Lipid Profile: No results for input(s): CHOL, HDL, LDLCALC, TRIG, CHOLHDL, LDLDIRECT in the last 72 hours. Thyroid Function Tests: No results for input(s): TSH, T4TOTAL, FREET4, T3FREE, THYROIDAB in the last 72 hours. Anemia Panel: No results for input(s): VITAMINB12, FOLATE, FERRITIN, TIBC, IRON, RETICCTPCT in the last 72 hours. Urine analysis:    Component Value Date/Time   COLORURINE YELLOW 07/22/2018 1709   APPEARANCEUR HAZY (A) 07/22/2018 1709   LABSPEC 1.019 07/22/2018 1709   PHURINE 5.0 07/22/2018 1709   GLUCOSEU 150 (A) 07/22/2018 1709   HGBUR SMALL (A) 07/22/2018 1709   BILIRUBINUR NEGATIVE 07/22/2018 1709   KETONESUR 20 (A) 07/22/2018 1709   PROTEINUR 100 (A) 07/22/2018 1709   NITRITE NEGATIVE 07/22/2018 1709   LEUKOCYTESUR NEGATIVE 07/22/2018 1709    Radiological Exams on Admission: Ct Abdomen Pelvis W Contrast  Result Date:  07/22/2018 CLINICAL DATA:  Abdominal pain and emesis EXAM: CT ABDOMEN AND PELVIS WITH CONTRAST TECHNIQUE: Multidetector CT imaging of the abdomen and pelvis was performed using the standard protocol following bolus administration of intravenous contrast. CONTRAST:  142mL OMNIPAQUE IOHEXOL 300 MG/ML  SOLN COMPARISON:  January 12, 2018 FINDINGS: Lower chest: There is mild posterior right base atelectasis. Lung bases otherwise are clear. There are foci of coronary artery calcification. There is a small hiatal hernia. Hepatobiliary: No focal liver lesions are appreciated. There is evidence of porcelain gallbladder with diffuse calcification of the wall of the gallbladder. The gallbladder is contracted. No biliary duct dilatation is evident. Pancreas: There is no appreciable pancreatic mass or inflammatory focus. Pancreas is mildly atrophic. Spleen: No splenic lesions are evident. Adrenals/Urinary Tract: Adrenals bilaterally appear unremarkable. There is a cyst arising from the posterior mid right kidney measuring 1.0 x 1.0 cm. There is a cyst arising from the lateral upper pole left kidney measuring 3.8 x 3.6 cm. A cyst arising from the posterior upper to mid left kidney measures 2.7 x 2.7 cm. There is no appreciable hydronephrosis on either side. There is no evident renal or ureteral calculus on either side. Urinary bladder is partially decompressed. There is asymmetric thickening of the anterior urinary bladder wall with a questionable mass occupying the anterior urinary bladder. Stomach/Bowel: There is dilatation of the stomach diffusely. Stomach is filled with fluid and air. There is dilatation of most of the small bowel loops with a transition zone in the mid ileal region consistent with a degree of small bowel obstruction. There is no evident free air or portal venous air. Vascular/Lymphatic: There is aortoiliac atherosclerosis. No aneurysm is evident. There is patchy calcification in major mesenteric arterial  vessels without major mesenteric arterial vessel obstruction. No adenopathy is appreciable in the abdomen or pelvis. Reproductive: Uterus is absent.  No pelvic mass evident. Other: There is no periappendiceal region inflammatory change. There is no abscess in the abdomen or pelvis. There is focal ascites in the dependent portion of the pelvis. Musculoskeletal: There is degenerative change in the lower thoracic and lumbar region. There is vacuum phenomenon at multiple levels. There are no blastic or lytic bone lesions. There is moderate spinal stenosis at L4-5 due to diffuse disc protrusion and bony hypertrophy. There are  no intramuscular or abdominal wall lesions evident. IMPRESSION: 1. Small bowel obstruction at the mid ileal level. No free air. No diverticulitis evident. 2. No abscess evident in the abdomen or pelvis. Relatively mild ascites in the dependent portion the pelvis may be sympathetic in response to the small-bowel obstruction. 3. Suspect mass arising in the anterior urinary bladder wall. Urinary bladder is largely decompressed, and this area is difficult to evaluate with certainty. This finding is felt to warrant urinalysis and urologic consultation. 4. Apparent porcelain gallbladder. Porcelain gallbladder is a risk factor for gallbladder carcinoma development. 5 There is aortoiliac artery atherosclerosis. There is also mesenteric and coronary artery calcifications. 6.  Uterus absent. 7. Moderate spinal stenosis at L4-5, multifactorial. Multilevel lumbar arthropathy. 8.  Small hiatal hernia. Electronically Signed   By: Lowella Grip III M.D.   On: 07/22/2018 17:51   Dg Abd Portable 1v  Result Date: 07/22/2018 CLINICAL DATA:  Check gastric catheter placement EXAM: PORTABLE ABDOMEN - 1 VIEW COMPARISON:  None. FINDINGS: Scattered large and small bowel gas is noted. Gastric catheter is noted looped upon itself within the stomach with the tip in the distal esophagus. This should be withdrawn  slightly and readvanced. IMPRESSION: Gastric catheter looped upon itself within the stomach with the tip in the distal esophagus. This should be withdrawn slightly and readvanced. Electronically Signed   By: Inez Catalina M.D.   On: 07/22/2018 20:19    EKG: Independently reviewed.  Sinus rhythm (heart rate 83), LVH.  Assessment/Plan Principal Problem:   SBO (small bowel obstruction) (HCC) Active Problems:   HTN (hypertension)   Leukocytosis   Hypokalemia   Type 2 diabetes mellitus (HCC)   Porcelain gallbladder   Physical deconditioning   SBO LFTs and lipase normal.  CT abdomen pelvis showing small bowel obstruction at the mid ileal level.  Patient was seen by Dr. Kae Heller from general surgery.  Recommended admission for NG decompression, resuscitation, symptom control, and surgery will start SBO protocol tomorrow morning.  They will continue to follow. -Continue NG tube.  Keep n.p.o.  IV fluid hydration.  Continue to monitor electrolytes.  Zofran PRN nausea.  Borderline leukocytosis Likely reactive.  Afebrile.  White count borderline elevated at 10.1.  UA not suggestive of infection.  Lungs clear on exam and no respiratory complaints. -Repeat CBC in a.m.  Mild hypokalemia Potassium 3.1 in the setting of vomiting. -Replete potassium, check magnesium level, repeat BMP in a.m.  Non-insulin-dependent diabetes mellitus Blood glucose 167 on admission.  Currently diet controlled. -Hold off ordering insulin at this time as patient is n.p.o.  Continue CBG checks.  Hypertension Blood pressure elevated. -Hold home p.o. medications as patient is n.p.o.  Give IV hydralazine PRN.  ?Bladder mass CT abdomen pelvis with a finding of suspected mass in the anterior urinary bladder wall.  UA with evidence of microscopic hematuria (small amount of hemoglobin on dipstick and 21-50 RBCs/ HPF on microscopic examination).  CT abdomen pelvis done in June 2019 also mentioned a focal area of bladder wall  thickening/mass-effect concerning for urothelial neoplasm.  Per daughters at bedside, patient was seen by Dr. Lovena Neighbours at [facility] urology in either July or August 2019 and work-up at that time including cystoscopy was nonrevealing.  Family states Dr. Lovena Neighbours had referred the patient to gynecology at that time and she was subsequently found to have a uterine wall mass for which she underwent hysterectomy. -Obtain records from urology in the morning  Porcelain gallbladder Seen on CT abdomen.  Risk factor  for gallbladder carcinoma development.  Per Dr. Kae Heller from general surgery, no surgical plans for the gallbladder appearance. -Outpatient follow-up   Physical deconditioning -PT evaluation  DVT prophylaxis: Subcutaneous heparin Code Status: Patient wishes to be full code. Family Communication: 2 daughters at bedside Disposition Plan: Anticipate discharge to home after clinical improvement. Consults called: General surgery Admission status: It is my clinical opinion that admission to INPATIENT is reasonable and necessary in this 84 y.o. female . presenting with symptoms of nausea and vomiting, concerning for SBO . in the context of PMH including: Total abdominal hysterectomy . with pertinent positives on physical exam including: NG tube for decompression . and pertinent positives on radiographic and laboratory data including: CT with evidence of SBO . Workup and treatment include keeping the patient n.p.o., IV fluid hydration, IV antiemetic.  Surgery following; SBO protocol.  Given the aforementioned, the predictability of an adverse outcome is felt to be significant. I expect that the patient will require at least 2 midnights in the hospital to treat this condition.    Shela Leff MD [facility] Pager [phone]  If 7PM-7AM, please contact night-coverage [email or web address] Password [facility]  07/22/2018, 9:28 PM

## 2018-07-22 NOTE — ED Provider Notes (Signed)
[facility] EMERGENCY DEPARTMENT Provider Note   CSN: [id number] Arrival date & time: 07/22/18  1434     History   Chief Complaint Chief Complaint  Patient presents with  . Emesis    HPI Brenda Alexander is a 84 y.o. female.  The history is provided by the patient, medical records and a relative. No language interpreter was used.  Emesis     Brenda Alexander is a 84 y.o. female who presents to the Emergency Department complaining of vomiting. Presents to the emergency department complaining of nausea and vomiting since last night. She reports numerous episodes of emesis that is nonbloody. She has associated left lower quadrant pain that began at the time of her vomiting. She does have constipation, last BM was two days ago. Denies any fevers, dysuria. She has a history of hysterectomy, performed in October of this year. No prior similar symptoms. She was seen in urgent care earlier today and referred to the emergency department for further evaluation. Past Medical History:  Diagnosis Date  . Anemia   . Arthritis    knees, hands  . Coronary artery disease   . Diabetes mellitus without complication (HCC)    no meds  . Dyspnea 01/12/2018   w/anemia dx and hospital admit w/blood transfused   . 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 History of blood transfusion 01/12/2018   at [facility]  . Hyperlipemia   . Hypertension   . SVD (spontaneous vaginal delivery)    x 6 - 5 living children  . Uses walker    rollator  . Wears partial dentures    upper and lower partials    Patient Active Problem List   Diagnosis Date Noted  . SBO (small bowel obstruction) ([place]) 07/22/2018  . Leukocytosis 07/22/2018  . Hypokalemia 07/22/2018  . Type 2 diabetes mellitus ([place]) 07/22/2018  . 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 Physical deconditioning 07/22/2018  . Endometrial cancer ([place]) 05/24/2018  . 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  PMB (postmenopausal bleeding) 04/09/2018  . Bladder mass   . Symptomatic anemia 01/11/2018  . HTN (hypertension) 01/11/2018  . Coronary artery disease 01/11/2018    Past Surgical History:  Procedure Laterality Date  . COLONOSCOPY    . EYE SURGERY     cataracts removed right eye  . HYSTEROSCOPY W/D&C N/A 05/06/2018   Procedure: DILATATION AND CURETTAGE /HYSTEROSCOPY;  Surgeon: [name], MD;  Location: [facility];  Service: Gynecology;  Laterality: N/A;  . MULTIPLE TOOTH EXTRACTIONS    . ROBOTIC ASSISTED TOTAL HYSTERECTOMY WITH BILATERAL SALPINGO OOPHERECTOMY Bilateral 05/24/2018   Procedure: XI ROBOTIC ASSISTED TOTAL HYSTERECTOMY WITH BILATERAL SALPINGO OOPHORECTOMY;  Surgeon: [name], MD;  Location: [name];  Service: Gynecology;  Laterality: Bilateral;  . SENTINEL NODE BIOPSY N/A 05/24/2018   Procedure: SENTINEL LYMP  NODE BIOPSY;  Surgeon: [name], MD;  Location: [name];  Service: Gynecology;  Laterality: N/A;     OB History    Gravida  6   Para  6   Term  6   Preterm      AB      Living  5     SAB      TAB      Ectopic      Multiple      Live Births  5        Obstetric Comments  Had term stillbirthx1         Home Medications  Prior to Admission medications   Medication Sig Start Date End Date Taking? Authorizing Provider  acetaminophen (TYLENOL) 500 MG tablet Take 1,000 mg by mouth every 6 (six) hours as needed (for pain).    Yes [provider]  amLODipine (NORVASC) 5 MG tablet Take 5 mg by mouth daily.   Yes [provider]  Calcium Carb-Cholecalciferol (CALCIUM + D3 PO) Take 1 tablet by mouth daily.   Yes [provider]  cloNIDine (CATAPRES) 0.2 MG tablet Take 0.2 mg by mouth 2 (two) times daily.   Yes [provider]  cycloSPORINE (RESTASIS) 0.05 % ophthalmic emulsion Place 1 drop into both eyes 2 (two) times daily.   Yes [provider]  enalapril (VASOTEC) 20 MG tablet Take 20 mg by  mouth 2 (two) times daily. 12/23/17  Yes [provider]  ferrous sulfate 325 (65 FE) MG EC tablet Take 1 tablet (325 mg total) by mouth 2 (two) times daily. 01/13/18 01/13/19 Yes Elgergawy, Silver Huguenin, MD  furosemide (LASIX) 40 MG tablet Take 40 mg by mouth daily.   Yes [provider]  isosorbide mononitrate (ISMO,MONOKET) 20 MG tablet Take 20 mg by mouth daily. 12/23/17  Yes [provider]  metoprolol succinate (TOPROL-XL) 50 MG 24 hr tablet Take 50 mg by mouth daily. 12/23/17  Yes [provider]  potassium chloride SA (K-DUR,KLOR-CON) 20 MEQ tablet Take 20 mEq by mouth daily.   Yes [provider]  simvastatin (ZOCOR) 20 MG tablet Take 20 mg by mouth at bedtime.    Yes [provider]  vitamin C (ASCORBIC ACID) 500 MG tablet Take 500 mg by mouth daily.   Yes [provider]  docusate sodium (COLACE) 100 MG capsule Take 1 capsule (100 mg total) by mouth 2 (two) times daily as needed for mild constipation or moderate constipation. Patient not taking: Reported on 07/22/2018 05/06/18   [name], MD    Family History Family History  Problem Relation Age of Onset  . Obesity Son   . Hypertension Father   . Stroke Father     Social History Social History   Tobacco Use  . Smoking status: Never Smoker  . Smokeless tobacco: Never Used  Substance Use Topics  . Alcohol use: Never    Frequency: Never  . Drug use: Never     Allergies   Other and Penicillins   Review of Systems Review of Systems  Gastrointestinal: Positive for vomiting.  All other systems reviewed and are negative.    Physical Exam Updated Vital Signs BP (!) 179/81 (BP Location: Right Arm)   Pulse 87   Temp 98.9 F (37.2 C) (Oral)   Resp 20   LMP  (LMP Unknown)   SpO2 97%   Physical Exam Vitals signs and nursing note reviewed.  Constitutional:      Appearance: She is well-developed.  HENT:     Head: Normocephalic and atraumatic.    Cardiovascular:     Rate and Rhythm: Normal rate and regular rhythm.     Heart sounds: No murmur.  Pulmonary:     Effort: Pulmonary effort is normal. No respiratory distress.     Breath sounds: Normal breath sounds.  Abdominal:     Palpations: Abdomen is soft.     Tenderness: There is no guarding or rebound.     Comments: Mild LLQ tenderness, no hernia or mass  Musculoskeletal:        General: No tenderness.  Skin:  General: Skin is warm and dry.  Neurological:     Mental Status: She is alert and oriented to person, place, and time.  Psychiatric:        Behavior: Behavior normal.      ED Treatments / Results  Labs (all labs ordered are listed, but only abnormal results are displayed) Labs Reviewed  COMPREHENSIVE METABOLIC PANEL - Abnormal; Notable for the following components:      Result Value   Potassium 3.1 (*)    Glucose, Bld 167 (*)    Calcium 10.4 (*)    GFR calc non Af Amer 59 (*)    All other components within normal limits  CBC - Abnormal; Notable for the following components:   RBC 5.46 (*)    MCH 25.1 (*)    All other components within normal limits  URINALYSIS, ROUTINE W REFLEX MICROSCOPIC - Abnormal; Notable for the following components:   APPearance HAZY (*)    Glucose, UA 150 (*)    Hgb urine dipstick SMALL (*)    Ketones, ur 20 (*)    Protein, ur 100 (*)    Bacteria, UA RARE (*)    All other components within normal limits  LIPASE, BLOOD  MAGNESIUM  CBC  BASIC METABOLIC PANEL  I-STAT TROPONIN, ED    EKG None  Radiology Ct Abdomen Pelvis W Contrast  Result Date: 07/22/2018 CLINICAL DATA:  Abdominal pain and emesis EXAM: CT ABDOMEN AND PELVIS WITH CONTRAST TECHNIQUE: Multidetector CT imaging of the abdomen and pelvis was performed using the standard protocol following bolus administration of intravenous contrast. CONTRAST:  124mL OMNIPAQUE IOHEXOL 300 MG/ML  SOLN COMPARISON:  January 12, 2018 FINDINGS: Lower chest: There is mild posterior  right base atelectasis. Lung bases otherwise are clear. There are foci of coronary artery calcification. There is a small hiatal hernia. Hepatobiliary: No focal liver lesions are appreciated. There is evidence of porcelain gallbladder with diffuse calcification of the wall of the gallbladder. The gallbladder is contracted. No biliary duct dilatation is evident. Pancreas: There is no appreciable pancreatic mass or inflammatory focus. Pancreas is mildly atrophic. Spleen: No splenic lesions are evident. Adrenals/Urinary Tract: Adrenals bilaterally appear unremarkable. There is a cyst arising from the posterior mid right kidney measuring 1.0 x 1.0 cm. There is a cyst arising from the lateral upper pole left kidney measuring 3.8 x 3.6 cm. A cyst arising from the posterior upper to mid left kidney measures 2.7 x 2.7 cm. There is no appreciable hydronephrosis on either side. There is no evident renal or ureteral calculus on either side. Urinary bladder is partially decompressed. There is asymmetric thickening of the anterior urinary bladder wall with a questionable mass occupying the anterior urinary bladder. Stomach/Bowel: There is dilatation of the stomach diffusely. Stomach is filled with fluid and air. There is dilatation of most of the small bowel loops with a transition zone in the mid ileal region consistent with a degree of small bowel obstruction. There is no evident free air or portal venous air. Vascular/Lymphatic: There is aortoiliac atherosclerosis. No aneurysm is evident. There is patchy calcification in major mesenteric arterial vessels without major mesenteric arterial vessel obstruction. No adenopathy is appreciable in the abdomen or pelvis. Reproductive: Uterus is absent.  No pelvic mass evident. Other: There is no periappendiceal region inflammatory change. There is no abscess in the abdomen or pelvis. There is focal ascites in the dependent portion of the pelvis. Musculoskeletal: There is degenerative  change in the lower thoracic and lumbar  region. There is vacuum phenomenon at multiple levels. There are no blastic or lytic bone lesions. There is moderate spinal stenosis at L4-5 due to diffuse disc protrusion and bony hypertrophy. There are no intramuscular or abdominal wall lesions evident. IMPRESSION: 1. Small bowel obstruction at the mid ileal level. No free air. No diverticulitis evident. 2. No abscess evident in the abdomen or pelvis. Relatively mild ascites in the dependent portion the pelvis may be sympathetic in response to the small-bowel obstruction. 3. Suspect mass arising in the anterior urinary bladder wall. Urinary bladder is largely decompressed, and this area is difficult to evaluate with certainty. This finding is felt to warrant urinalysis and urologic consultation. 4. Apparent porcelain gallbladder. Porcelain gallbladder is a risk factor for gallbladder carcinoma development. 5 There is aortoiliac artery atherosclerosis. There is also mesenteric and coronary artery calcifications. 6.  Uterus absent. 7. Moderate spinal stenosis at L4-5, multifactorial. Multilevel lumbar arthropathy. 8.  Small hiatal hernia. Electronically Signed   By: Lowella Grip III M.D.   On: 07/22/2018 17:51   Dg Abd Portable 1v  Result Date: 07/22/2018 CLINICAL DATA:  Check gastric catheter placement EXAM: PORTABLE ABDOMEN - 1 VIEW COMPARISON:  None. FINDINGS: Scattered large and small bowel gas is noted. Gastric catheter is noted looped upon itself within the stomach with the tip in the distal esophagus. This should be withdrawn slightly and readvanced. IMPRESSION: Gastric catheter looped upon itself within the stomach with the tip in the distal esophagus. This should be withdrawn slightly and readvanced. Electronically Signed   By: Inez Catalina M.D.   On: 07/22/2018 20:19    Procedures Procedures (including critical care time)  Medications Ordered in ED Medications  cycloSPORINE (RESTASIS) 0.05 %  ophthalmic emulsion 1 drop (1 drop Both Eyes Given 07/22/18 2145)  heparin injection 5,000 Units (5,000 Units Subcutaneous Given 07/22/18 2148)  0.9 % NaCl with KCl 40 mEq / L  infusion (125 mL/hr Intravenous New Bag/Given 07/22/18 2145)  ondansetron (ZOFRAN) injection 4 mg (has no administration in time range)  hydrALAZINE (APRESOLINE) injection 5 mg (5 mg Intravenous Given 07/22/18 2147)  diphenhydrAMINE (BENADRYL) injection 25 mg (has no administration in time range)  ondansetron (ZOFRAN-ODT) disintegrating tablet 4 mg (4 mg Oral Given 07/22/18 1516)  sodium chloride 0.9 % bolus 500 mL (0 mLs Intravenous Stopped 07/22/18 1857)  iohexol (OMNIPAQUE) 300 MG/ML solution 100 mL (100 mLs Intravenous Contrast Given 07/22/18 1729)     Initial Impression / Assessment and Plan / ED Course  I have reviewed the triage vital signs and the nursing notes.  Pertinent labs & imaging results that were available during my care of the patient were reviewed by me and considered in my medical decision making (see chart for details).     Patient here for evaluation of nausea, vomiting and abdominal pain that began earlier today. She does have mild tenderness on examination without peritoneal findings. Imaging demonstrates acute bowel obstruction was transition point.  D/w Dr. Kae Heller with general surgery, who evaluated the patient in the ED.  Nasogastric tube placed for decompression.  Medicine consulted for admission.  Patient updated of findings of studies and recommendation for admission and she is in agreement with treatment plan.    Final Clinical Impressions(s) / ED Diagnoses   Final diagnoses:  Complete intestinal obstruction, unspecified cause [facility])    ED Discharge Orders    None       Quintella Reichert, MD 07/23/18 [phone]

## 2018-07-22 NOTE — ED Triage Notes (Signed)
Nausea, vomiting, abdominal pain and hot and cold flashes

## 2018-07-22 NOTE — ED Provider Notes (Signed)
[facility]    CSN: [id number] Arrival date & time: 07/22/18  1248     History   Chief Complaint Chief Complaint  Patient presents with  . Emesis    HPI Brenda Alexander is a 84 y.o. female.   84 year old woman presents for the first time to urgent care.  She complains about feeling hot with nausea and vomiting.  Patient has had some upper abdominal and epigastric discomfort but no pain at the present time.  She started the vomiting yesterday, Christmas Day.  She has had no diarrhea.  Patient's past medical history is significant for coronary disease, endometrial adenocarcinoma the uterus.     Past Medical History:  Diagnosis Date  . Anemia   . Arthritis    knees, hands  . Coronary artery disease   . Diabetes mellitus without complication (HCC)    no meds  . Dyspnea 01/12/2018   w/anemia dx and hospital admit w/blood transfused   . 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 History of blood transfusion 01/12/2018   at [facility]  . Hyperlipemia   . Hypertension   . SVD (spontaneous vaginal delivery)    x 6 - 5 living children  . Uses walker    rollator  . Wears partial dentures    upper and lower partials    Patient Active Problem List   Diagnosis Date Noted  . Endometrial cancer ([place]) 05/24/2018  . 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 PMB (postmenopausal bleeding) 04/09/2018  . Bladder mass   . Symptomatic anemia 01/11/2018  . Hypertension 01/11/2018  . Coronary artery disease 01/11/2018    Past Surgical History:  Procedure Laterality Date  . COLONOSCOPY    . EYE SURGERY     cataracts removed right eye  . HYSTEROSCOPY W/D&C N/A 05/06/2018   Procedure: DILATATION AND CURETTAGE /HYSTEROSCOPY;  Surgeon: [name], MD;  Location: [facility];  Service: Gynecology;  Laterality: N/A;  . MULTIPLE TOOTH EXTRACTIONS    . ROBOTIC ASSISTED TOTAL HYSTERECTOMY WITH BILATERAL SALPINGO OOPHERECTOMY Bilateral  05/24/2018   Procedure: XI ROBOTIC ASSISTED TOTAL HYSTERECTOMY WITH BILATERAL SALPINGO OOPHORECTOMY;  Surgeon: [name], MD;  Location: [name];  Service: Gynecology;  Laterality: Bilateral;  . SENTINEL NODE BIOPSY N/A 05/24/2018   Procedure: SENTINEL LYMP  NODE BIOPSY;  Surgeon: [name], MD;  Location: [name];  Service: Gynecology;  Laterality: N/A;    OB History    Gravida  6   Para  6   Term  6   Preterm      AB      Living  5     SAB      TAB      Ectopic      Multiple      Live Births  5        Obstetric Comments  Had term stillbirthx1         Home Medications    Prior to Admission medications   Medication Sig Start Date End Date Taking? Authorizing Provider  acetaminophen (TYLENOL) 500 MG tablet Take 1,000 mg by mouth every 6 (six) hours as needed (for pain).     [provider]  amLODipine (NORVASC) 5 MG tablet Take 5 mg by mouth daily.    [provider]  Calcium Carb-Cholecalciferol (CALCIUM + D3 PO) Take 1 tablet by mouth daily.    [provider]  cloNIDine (CATAPRES) 0.2 MG tablet Take 0.2 mg by mouth 2 (two)  times daily.    [provider]  cycloSPORINE (RESTASIS) 0.05 % ophthalmic emulsion Place 1 drop into both eyes 2 (two) times daily.    [provider]  docusate sodium (COLACE) 100 MG capsule Take 1 capsule (100 mg total) by mouth 2 (two) times daily as needed for mild constipation or moderate constipation. 05/06/18   Anyanwu, Sallyanne Havers, MD  enalapril (VASOTEC) 20 MG tablet Take 20 mg by mouth 2 (two) times daily. 12/23/17   [provider]  ferrous sulfate 325 (65 FE) MG EC tablet Take 1 tablet (325 mg total) by mouth 2 (two) times daily. 01/13/18 01/13/19  Elgergawy, Silver Huguenin, MD  furosemide (LASIX) 40 MG tablet Take 40 mg by mouth daily.    [provider]  ibuprofen (ADVIL,MOTRIN) 600 MG tablet Take 1 tablet (600 mg total) by mouth every 6 (six) hours as needed. 05/25/18   [name], MD  isosorbide mononitrate (ISMO,MONOKET) 20 MG tablet Take 20 mg by mouth daily. 12/23/17   [provider]  metoprolol succinate (TOPROL-XL) 50 MG 24 hr tablet Take 50 mg by mouth daily. 12/23/17   [provider]  oxyCODONE (OXY IR/ROXICODONE) 5 MG immediate release tablet Take 1-2 tablets (5-10 mg total) by mouth every 4 (four) hours as needed for moderate pain. 05/25/18   [name], MD  potassium chloride SA (K-DUR,KLOR-CON) 20 MEQ tablet Take 20 mEq by mouth daily.    [provider]  senna (SENOKOT) 8.6 MG TABS tablet Take 1 tablet (8.6 mg total) by mouth at bedtime. 05/25/18   [name], MD  simvastatin (ZOCOR) 20 MG tablet Take 20 mg by mouth at bedtime.     [provider]  vitamin C (ASCORBIC ACID) 500 MG tablet Take 500 mg by mouth daily.    [provider]    Family History Family History  Problem Relation Age of Onset  . Obesity Son   . Hypertension Father   . Stroke Father     Social History Social History   Tobacco Use  . Smoking status: Never Smoker  . Smokeless tobacco: Never Used  Substance Use Topics  . Alcohol use: Never    Frequency: Never  . Drug use: Never     Allergies   Other and Penicillins   Review of Systems Review of Systems  Constitutional: Positive for chills, diaphoresis and fatigue. Negative for fever.  Gastrointestinal: Positive for abdominal pain and vomiting. Negative for diarrhea.     Physical Exam Triage Vital Signs ED Triage Vitals  Enc Vitals Group     BP      Pulse      Resp      Temp      Temp src      SpO2      Weight      Height      Head Circumference      Peak Flow      Pain Score      Pain Loc      Pain Edu?      Excl. in [place]?    No data found.  Updated Vital Signs BP (!) 170/76 (BP Location: Right Arm)   Pulse 96   Temp 98 F (36.7 C) (Oral)   Resp (!) 24   LMP  (LMP Unknown)   SpO2 97%    Physical Exam Vitals signs and nursing note reviewed.    Constitutional:      Appearance: Normal appearance. She is  obese. She is ill-appearing.  HENT:     Head: Normocephalic.     Right Ear: External ear normal.     Left Ear: External ear normal.     Nose: Nose normal.     Mouth/Throat:     Mouth: Mucous membranes are moist.     Pharynx: Oropharynx is clear.  Eyes:     Conjunctiva/sclera: Conjunctivae normal.  Neck:     Musculoskeletal: Normal range of motion and neck supple.  Cardiovascular:     Rate and Rhythm: Normal rate. Rhythm irregular.     Heart sounds: Murmur present.  Pulmonary:     Effort: Pulmonary effort is normal.     Breath sounds: Normal breath sounds.  Abdominal:     General: Bowel sounds are normal.     Palpations: Abdomen is soft.     Tenderness: There is no abdominal tenderness. There is no guarding or rebound.  Musculoskeletal: Normal range of motion.  Skin:    General: Skin is warm and dry.  Neurological:     Mental Status: She is alert.     Motor: Weakness present.     Gait: Gait abnormal.     Comments: Patient cannot walk without assistance.      UC Treatments / Results  Labs (all labs ordered are listed, but only abnormal results are displayed) Labs Reviewed  I-STAT CHEM 8, ED    EKG None  Radiology No results found.  Procedures Procedures (including critical care time)  Medications Ordered in UC Medications - No data to display  Initial Impression / Assessment and Plan / UC Course  I have reviewed the triage vital signs and the nursing notes.  Pertinent labs & imaging results that were available during my care of the patient were reviewed by me and considered in my medical decision making (see chart for details).    Final Clinical Impressions(s) / UC Diagnoses   Final diagnoses:  Intractable vomiting with nausea, unspecified vomiting type     Discharge Instructions     You are too sick and weak for [place] to treat you here.    ED Prescriptions    None     Controlled  Substance Prescriptions [place] Controlled Substance Registry consulted? Not Applicable   Robyn Haber, MD 07/22/18 1426

## 2018-07-22 NOTE — ED Triage Notes (Signed)
Pt sent over from [facility]. Pt reports nausea, vomiting x3 days, unable to keep anything down x1 day. Pt denies fever, sick contact. Pt reports intermittent abdominal pain, denies currently.

## 2018-07-23 ENCOUNTER — Inpatient Hospital Stay (HOSPITAL_COMMUNITY): Payer: Medicare HMO

## 2018-07-23 ENCOUNTER — Other Ambulatory Visit: Payer: Self-pay

## 2018-07-23 LAB — CBC
HCT: 39.2 % (ref 36.0–46.0)
Hemoglobin: 12.4 g/dL (ref 12.0–15.0)
MCH: 25.9 pg — ABNORMAL LOW (ref 26.0–34.0)
MCHC: 31.6 g/dL (ref 30.0–36.0)
MCV: 81.8 fL (ref 80.0–100.0)
Platelets: 248 10*3/uL (ref 150–400)
RBC: 4.79 MIL/uL (ref 3.87–5.11)
RDW: 15.5 % (ref 11.5–15.5)
WBC: 10.4 10*3/uL (ref 4.0–10.5)
nRBC: 0 % (ref 0.0–0.2)

## 2018-07-23 LAB — BASIC METABOLIC PANEL
Anion gap: 12 (ref 5–15)
BUN: 14 mg/dL (ref 8–23)
CO2: 27 mmol/L (ref 22–32)
Calcium: 9.1 mg/dL (ref 8.9–10.3)
Chloride: 100 mmol/L (ref 98–111)
Creatinine, Ser: 0.84 mg/dL (ref 0.44–1.00)
GFR calc Af Amer: >60 mL/min (ref >60)
GFR calc non Af Amer: >60 mL/min (ref >60)
Glucose, Bld: 108 mg/dL — ABNORMAL HIGH (ref 70–99)
Potassium: 3.2 mmol/L — ABNORMAL LOW (ref 3.5–5.1)
Sodium: 139 mmol/L (ref 135–145)

## 2018-07-23 LAB — GLUCOSE, CAPILLARY
Glucose-Capillary: 97 mg/dL (ref 70–99)
Glucose-Capillary: 90 mg/dL (ref 70–99)

## 2018-07-23 MED ORDER — ORAL CARE MOUTH RINSE
15.0000 mL | Freq: Two times a day (BID) | OROMUCOSAL | Status: DC
  Administered 2018-07-23 – 2018-07-24 (×4): 15 mL via OROMUCOSAL

## 2018-07-23 MED ORDER — DIATRIZOATE MEGLUMINE & SODIUM 66-10 % PO SOLN
90.0000 mL | Freq: Once | ORAL | Status: AC
  Administered 2018-07-23: 90 mL via NASOGASTRIC
  Filled 2018-07-23: qty 90

## 2018-07-23 NOTE — Progress Notes (Signed)
Patient arrived from ED via stretcher, stood and pivoted to new bed in the room. Patient daughter at bedside

## 2018-07-23 NOTE — Progress Notes (Signed)
Notified radiology that gastrografin was administered at 1001

## 2018-07-23 NOTE — Progress Notes (Signed)
[facility]/Trauma Progress Note      Assessment/Plan S/P robotic-assisted total hysterectomy/ BSO 2 months ago for endometrial cancer grade 1 Incidentally noted porcelain gallbladder - no surgery indicated possible anterior bladder mass - per medicine  SBO - NGT last night was not positioned correctly - nurse adjusting and AXR pending - will assess NGT output today after advancement and determine if we need to proceed with SBO protocol  FEN: NGT, NPO, IVF VTE: SCD's, heparin ID: none Foley: none Follow up: TBD  DISPO: pt having flatus, NGT output adjustment and AXR pending    LOS: 1 day    Subjective: CC: SBO  No abdominal pain this am. She states she is feeling better. She is having flatus. She denies nausea. Daughter at bedside.  Objective: Vital signs in last 24 hours: Temp:  [98 F (36.7 C)-99.6 F (37.6 C)] 99.6 F (37.6 C) (12/27 0509) Pulse Rate:  [83-106] 98 (12/27 0509) Resp:  [18-42] 18 (12/27 0509) BP: (168-198)/(76-117) 177/79 (12/27 0509) SpO2:  [94 %-100 %] 97 % (12/26 2037) Last BM Date: 07/20/18  Intake/Output from previous day: 12/26 0701 - 12/27 0700 In: 1414.5 [I.V.:904.5; NG/GT:10; IV Piggyback:500] Out: 100 [Emesis/NG output:100] Intake/Output this shift: No intake/output data recorded.  PE: Gen:  Alert, NAD, pleasant, cooperative Pulm:  Rate and effort normal Abd: Soft, NT/ND, +BS, NGT output scant and yellow Skin: warm and dry   Anti-infectives: Anti-infectives (From admission, onward)   None      Lab Results:  Recent Labs    07/22/18 1520 07/23/18 0212  WBC 10.1 10.4  HGB 13.7 12.4  HCT 44.4 39.2  PLT 243 248   BMET Recent Labs    07/22/18 1520 07/23/18 0212  NA 136 139  K 3.1* 3.2*  CL 98 100  CO2 23 27  GLUCOSE 167* 108*  BUN 14 14  CREATININE 0.90 0.84  CALCIUM 10.4* 9.1   PT/INR No results for input(s): LABPROT, INR in the last 72 hours. CMP     Component Value Date/Time   NA 139  07/23/2018 0212   K 3.2 (L) 07/23/2018 0212   CL 100 07/23/2018 0212   CO2 27 07/23/2018 0212   GLUCOSE 108 (H) 07/23/2018 0212   BUN 14 07/23/2018 0212   CREATININE 0.84 07/23/2018 0212   CALCIUM 9.1 07/23/2018 0212   PROT 8.0 07/22/2018 1520   ALBUMIN 4.5 07/22/2018 1520   AST 41 07/22/2018 1520   ALT 20 07/22/2018 1520   ALKPHOS 82 07/22/2018 1520   BILITOT 0.7 07/22/2018 1520   GFRNONAA >60 07/23/2018 0212   GFRAA >60 07/23/2018 0212   Lipase     Component Value Date/Time   LIPASE 26 07/22/2018 1520    Studies/Results: Ct Abdomen Pelvis W Contrast  Result Date: 07/22/2018 CLINICAL DATA:  Abdominal pain and emesis EXAM: CT ABDOMEN AND PELVIS WITH CONTRAST TECHNIQUE: Multidetector CT imaging of the abdomen and pelvis was performed using the standard protocol following bolus administration of intravenous contrast. CONTRAST:  179mL OMNIPAQUE IOHEXOL 300 MG/ML  SOLN COMPARISON:  January 12, 2018 FINDINGS: Lower chest: There is mild posterior right base atelectasis. Lung bases otherwise are clear. There are foci of coronary artery calcification. There is a small hiatal hernia. Hepatobiliary: No focal liver lesions are appreciated. There is evidence of porcelain gallbladder with diffuse calcification of the wall of the gallbladder. The gallbladder is contracted. No biliary duct dilatation is evident. Pancreas: There is no appreciable pancreatic mass or inflammatory focus. Pancreas is mildly  atrophic. Spleen: No splenic lesions are evident. Adrenals/Urinary Tract: Adrenals bilaterally appear unremarkable. There is a cyst arising from the posterior mid right kidney measuring 1.0 x 1.0 cm. There is a cyst arising from the lateral upper pole left kidney measuring 3.8 x 3.6 cm. A cyst arising from the posterior upper to mid left kidney measures 2.7 x 2.7 cm. There is no appreciable hydronephrosis on either side. There is no evident renal or ureteral calculus on either side. Urinary bladder is  partially decompressed. There is asymmetric thickening of the anterior urinary bladder wall with a questionable mass occupying the anterior urinary bladder. Stomach/Bowel: There is dilatation of the stomach diffusely. Stomach is filled with fluid and air. There is dilatation of most of the small bowel loops with a transition zone in the mid ileal region consistent with a degree of small bowel obstruction. There is no evident free air or portal venous air. Vascular/Lymphatic: There is aortoiliac atherosclerosis. No aneurysm is evident. There is patchy calcification in major mesenteric arterial vessels without major mesenteric arterial vessel obstruction. No adenopathy is appreciable in the abdomen or pelvis. Reproductive: Uterus is absent.  No pelvic mass evident. Other: There is no periappendiceal region inflammatory change. There is no abscess in the abdomen or pelvis. There is focal ascites in the dependent portion of the pelvis. Musculoskeletal: There is degenerative change in the lower thoracic and lumbar region. There is vacuum phenomenon at multiple levels. There are no blastic or lytic bone lesions. There is moderate spinal stenosis at L4-5 due to diffuse disc protrusion and bony hypertrophy. There are no intramuscular or abdominal wall lesions evident. IMPRESSION: 1. Small bowel obstruction at the mid ileal level. No free air. No diverticulitis evident. 2. No abscess evident in the abdomen or pelvis. Relatively mild ascites in the dependent portion the pelvis may be sympathetic in response to the small-bowel obstruction. 3. Suspect mass arising in the anterior urinary bladder wall. Urinary bladder is largely decompressed, and this area is difficult to evaluate with certainty. This finding is felt to warrant urinalysis and urologic consultation. 4. Apparent porcelain gallbladder. Porcelain gallbladder is a risk factor for gallbladder carcinoma development. 5 There is aortoiliac artery atherosclerosis. There  is also mesenteric and coronary artery calcifications. 6.  Uterus absent. 7. Moderate spinal stenosis at L4-5, multifactorial. Multilevel lumbar arthropathy. 8.  Small hiatal hernia. Electronically Signed   By: Lowella Grip III M.D.   On: 07/22/2018 17:51   Dg Abd Portable 1v  Result Date: 07/22/2018 CLINICAL DATA:  Check gastric catheter placement EXAM: PORTABLE ABDOMEN - 1 VIEW COMPARISON:  None. FINDINGS: Scattered large and small bowel gas is noted. Gastric catheter is noted looped upon itself within the stomach with the tip in the distal esophagus. This should be withdrawn slightly and readvanced. IMPRESSION: Gastric catheter looped upon itself within the stomach with the tip in the distal esophagus. This should be withdrawn slightly and readvanced. Electronically Signed   By: Inez Catalina M.D.   On: 07/22/2018 20:19      Kalman Drape , [facility] Surgery 07/23/2018, 7:47 AM  Pager: [phone] Mon-Wed, Friday 7:00am-4:30pm Thurs 7am-11:30am  Consults: [phone]

## 2018-07-23 NOTE — Progress Notes (Signed)
PROGRESS NOTE    Brenda Alexander  VEL:[id number] DOB: 05-Mar-1933 DOA: 07/22/2018 PCP: Nolene Ebbs, MD   Brief Narrative:  Per HPI: Brenda Alexander is a 84 y.o. female with medical history significant of CAD, NIDDM, hypertension, hyperlipidemia, prior hysterectomy presenting to the hospital for evaluation of vomiting.  Patient reports having multiple episodes of bilious, nonbloody emesis for the past 1 day.  She has not been able to tolerate p.o. intake.  States she previously had pain in her right lower quadrant but no abdominal pain at present.  Denies having any diarrhea.  No fevers or chills.  Patient was admitted with small bowel obstruction and some hypokalemia.  She unfortunately had poor placement of her NG tube and this has been readjusted this morning by general surgery.  Continue to follow output and replete potassium.  Assessment & Plan:   Principal Problem:   SBO (small bowel obstruction) (HCC) Active Problems:   HTN (hypertension)   Leukocytosis   Hypokalemia   Type 2 diabetes mellitus (HCC)   Porcelain gallbladder   Physical deconditioning  SBO -Continue NG tube to low intermittent suction and follow-up -Maintain on IV fluid hydration -Appreciate further general surgery recommendations  Borderline leukocytosis -Stable -Repeat CBC in a.m.  Mild hypokalemia -Continues to persist; maintain on IV supplementation and recheck BMP in a.m. -Magnesium within normal limits  Non-insulin-dependent diabetes mellitus -Blood glucose currently controlled and patient is n.p.o. -Order SSI as needed  Hypertension Blood pressure elevated. -Hold home p.o. medications as patient is n.p.o.  Give IV hydralazine PRN.  ?Bladder mass CT abdomen pelvis with a finding of suspected mass in the anterior urinary bladder wall.  UA with evidence of microscopic hematuria (small amount of hemoglobin on dipstick and 21-50 RBCs/ HPF on microscopic examination).  CT abdomen pelvis done in June  2019 also mentioned a focal area of bladder wall thickening/mass-effect concerning for urothelial neoplasm.  Per daughters at bedside, patient was seen by Dr. Lovena Neighbours at [facility] urology in either July or August 2019 and work-up at that time including cystoscopy was nonrevealing.  Family states Dr. Lovena Neighbours had referred the patient to gynecology at that time and she was subsequently found to have a uterine wall mass for which she underwent hysterectomy. -Request for records from urology obtained and this is pending  Porcelain gallbladder Seen on CT abdomen.  Risk factor for gallbladder carcinoma development.  Per Dr. Kae Heller from general surgery, no surgical plans for the gallbladder appearance. -Outpatient follow-up   Physical deconditioning -PT evaluation  DVT prophylaxis: Heparin Code Status: Full code Family Communication: None at bedside Disposition Plan: Continue conservative management of SBO with further general surgery recommendations to follow.   Consultants:   General surgery  Procedures:   None  Antimicrobials:   None   Subjective: Patient seen and evaluated today with no new acute complaints or concerns. No acute concerns or events noted overnight.  She denies any abdominal pain, nausea, or vomiting and is passing flatus.  No bowel movement noted.  Objective: Vitals:   07/22/18 1930 07/22/18 1945 07/22/18 2037 07/23/18 0509  BP: (!) 198/104 (!) 168/96 (!) 179/81 (!) 177/79  Pulse: (!) 105 (!) 106 87 98  Resp:   20 18  Temp:   98.9 F (37.2 C) 99.6 F (37.6 C)  TempSrc:   Oral Oral  SpO2: 98% 96% 97%     Intake/Output Summary (Last 24 hours) at 07/23/2018 0831 Last data filed at 07/23/2018 0500 Gross per 24 hour  Intake 1414.45 ml  Output 100 ml  Net 1314.45 ml   There were no vitals filed for this visit.  Examination:  General exam: Appears calm and comfortable  Respiratory system: Clear to auscultation. Respiratory effort normal. Cardiovascular  system: S1 & S2 heard, RRR. No JVD, murmurs, rubs, gallops or clicks. No pedal edema. Gastrointestinal system: Abdomen is nondistended, soft and nontender. No organomegaly or masses felt.  Hypoactive bowel sounds.  NG tube to low intermittent suction. Central nervous system: Alert and oriented. No focal neurological deficits. Extremities: Symmetric 5 x 5 power. Skin: No rashes, lesions or ulcers Psychiatry: Judgement and insight appear normal. Mood & affect appropriate.     Data Reviewed: I have personally reviewed following labs and imaging studies  CBC: Recent Labs  Lab 07/22/18 1520 07/23/18 0212  WBC 10.1 10.4  HGB 13.7 12.4  HCT 44.4 39.2  MCV 81.3 81.8  PLT 243 361   Basic Metabolic Panel: Recent Labs  Lab 07/22/18 1520 07/22/18 2233 07/23/18 0212  NA 136  --  139  K 3.1*  --  3.2*  CL 98  --  100  CO2 23  --  27  GLUCOSE 167*  --  108*  BUN 14  --  14  CREATININE 0.90  --  0.84  CALCIUM 10.4*  --  9.1  MG  --  1.9  --    GFR: CrCl cannot be calculated (Unknown ideal weight.). Liver Function Tests: Recent Labs  Lab 07/22/18 1520  AST 41  ALT 20  ALKPHOS 82  BILITOT 0.7  PROT 8.0  ALBUMIN 4.5   Recent Labs  Lab 07/22/18 1520  LIPASE 26   No results for input(s): AMMONIA in the last 168 hours. Coagulation Profile: No results for input(s): INR, PROTIME in the last 168 hours. Cardiac Enzymes: No results for input(s): CKTOTAL, CKMB, CKMBINDEX, TROPONINI in the last 168 hours. BNP (last 3 results) No results for input(s): PROBNP in the last 8760 hours. HbA1C: No results for input(s): HGBA1C in the last 72 hours. CBG: No results for input(s): GLUCAP in the last 168 hours. Lipid Profile: No results for input(s): CHOL, HDL, LDLCALC, TRIG, CHOLHDL, LDLDIRECT in the last 72 hours. Thyroid Function Tests: No results for input(s): TSH, T4TOTAL, FREET4, T3FREE, THYROIDAB in the last 72 hours. Anemia Panel: No results for input(s): VITAMINB12, FOLATE,  FERRITIN, TIBC, IRON, RETICCTPCT in the last 72 hours. Sepsis Labs: No results for input(s): PROCALCITON, LATICACIDVEN in the last 168 hours.  No results found for this or any previous visit (from the past 240 hour(s)).       Radiology Studies: Ct Abdomen Pelvis W Contrast  Result Date: 07/22/2018 CLINICAL DATA:  Abdominal pain and emesis EXAM: CT ABDOMEN AND PELVIS WITH CONTRAST TECHNIQUE: Multidetector CT imaging of the abdomen and pelvis was performed using the standard protocol following bolus administration of intravenous contrast. CONTRAST:  164mL OMNIPAQUE IOHEXOL 300 MG/ML  SOLN COMPARISON:  January 12, 2018 FINDINGS: Lower chest: There is mild posterior right base atelectasis. Lung bases otherwise are clear. There are foci of coronary artery calcification. There is a small hiatal hernia. Hepatobiliary: No focal liver lesions are appreciated. There is evidence of porcelain gallbladder with diffuse calcification of the wall of the gallbladder. The gallbladder is contracted. No biliary duct dilatation is evident. Pancreas: There is no appreciable pancreatic mass or inflammatory focus. Pancreas is mildly atrophic. Spleen: No splenic lesions are evident. Adrenals/Urinary Tract: Adrenals bilaterally appear unremarkable. There is a cyst arising from the posterior mid right  kidney measuring 1.0 x 1.0 cm. There is a cyst arising from the lateral upper pole left kidney measuring 3.8 x 3.6 cm. A cyst arising from the posterior upper to mid left kidney measures 2.7 x 2.7 cm. There is no appreciable hydronephrosis on either side. There is no evident renal or ureteral calculus on either side. Urinary bladder is partially decompressed. There is asymmetric thickening of the anterior urinary bladder wall with a questionable mass occupying the anterior urinary bladder. Stomach/Bowel: There is dilatation of the stomach diffusely. Stomach is filled with fluid and air. There is dilatation of most of the small bowel  loops with a transition zone in the mid ileal region consistent with a degree of small bowel obstruction. There is no evident free air or portal venous air. Vascular/Lymphatic: There is aortoiliac atherosclerosis. No aneurysm is evident. There is patchy calcification in major mesenteric arterial vessels without major mesenteric arterial vessel obstruction. No adenopathy is appreciable in the abdomen or pelvis. Reproductive: Uterus is absent.  No pelvic mass evident. Other: There is no periappendiceal region inflammatory change. There is no abscess in the abdomen or pelvis. There is focal ascites in the dependent portion of the pelvis. Musculoskeletal: There is degenerative change in the lower thoracic and lumbar region. There is vacuum phenomenon at multiple levels. There are no blastic or lytic bone lesions. There is moderate spinal stenosis at L4-5 due to diffuse disc protrusion and bony hypertrophy. There are no intramuscular or abdominal wall lesions evident. IMPRESSION: 1. Small bowel obstruction at the mid ileal level. No free air. No diverticulitis evident. 2. No abscess evident in the abdomen or pelvis. Relatively mild ascites in the dependent portion the pelvis may be sympathetic in response to the small-bowel obstruction. 3. Suspect mass arising in the anterior urinary bladder wall. Urinary bladder is largely decompressed, and this area is difficult to evaluate with certainty. This finding is felt to warrant urinalysis and urologic consultation. 4. Apparent porcelain gallbladder. Porcelain gallbladder is a risk factor for gallbladder carcinoma development. 5 There is aortoiliac artery atherosclerosis. There is also mesenteric and coronary artery calcifications. 6.  Uterus absent. 7. Moderate spinal stenosis at L4-5, multifactorial. Multilevel lumbar arthropathy. 8.  Small hiatal hernia. Electronically Signed   By: Lowella Grip III M.D.   On: 07/22/2018 17:51   Dg Abd Portable 1v  Result Date:  07/22/2018 CLINICAL DATA:  Check gastric catheter placement EXAM: PORTABLE ABDOMEN - 1 VIEW COMPARISON:  None. FINDINGS: Scattered large and small bowel gas is noted. Gastric catheter is noted looped upon itself within the stomach with the tip in the distal esophagus. This should be withdrawn slightly and readvanced. IMPRESSION: Gastric catheter looped upon itself within the stomach with the tip in the distal esophagus. This should be withdrawn slightly and readvanced. Electronically Signed   By: Inez Catalina M.D.   On: 07/22/2018 20:19        Scheduled Meds: . cycloSPORINE  1 drop Both Eyes BID  . heparin  5,000 Units Subcutaneous Q8H   Continuous Infusions: . 0.9 % NaCl with KCl 40 mEq / L 125 mL/hr (07/23/18 0240)     LOS: 1 day    Time spent: 30 minutes    Brenda Alexander Darleen Crocker, DO [facility] Pager [phone]  If 7PM-7AM, please contact night-coverage [email or web address] Password [facility] 07/23/2018, 8:31 AM

## 2018-07-23 NOTE — Progress Notes (Signed)
   07/23/18 1413  Vitals  Temp 99.3 F (37.4 C)  Temp Source Oral  BP (!) 190/91  MAP (mmHg) 117  BP Location Left Arm  BP Method Automatic  Patient Position (if appropriate) Sitting  Pulse Rate (!) 121  Resp 18  Oxygen Therapy  SpO2 99 %  O2 Device Room Air  Dr. Manuella Ghazi notified of BP and pulse.

## 2018-07-23 NOTE — Evaluation (Signed)
Physical Therapy Evaluation Patient Details Name: Brenda Alexander MRN: [id number] DOB: June 05, 1933 Today's Date: 07/23/2018   History of Present Illness  84 y.o. female with medical history significant of CAD, NIDDM, hypertension, hyperlipidemia, prior hysterectomy presenting to the hospital for evaluation of vomiting.  Patient reports having multiple episodes of bilious, nonbloody emesis for the past 1 day. CT abdomen pelvis showing small bowel obstruction at the mid ileal level. CT of also revealed abdomen pelvis with a finding of suspected mass in the anterior urinary bladder wall as well as porceline gallbladder.  Clinical Impression  PTA pt living alone in level entry apartment, independent in mobility with cane/RW and ADLs. Family members assist with transportation to store and appointments. Pt currently limited in safe mobility by decreased strength and balance. Pt requires min guard for transfers and minA for ambulation of 15 feet with RW. PT recommends 24 hr supervision on d/c, which pt reports family can supply, as well as HHPT level rehab to regain strength and balance. If family can not provide 24 hr support pt may need SNF level care. PT will continue to follow acutely.     Follow Up Recommendations Home health PT;Supervision/Assistance - 24 hour    Equipment Recommendations  3in1 (PT)    Recommendations for Other Services OT consult     Precautions / Restrictions Precautions Precautions: None Precaution Comments: NG tube placed Restrictions Weight Bearing Restrictions: No      Mobility  Bed Mobility               General bed mobility comments: sitting EoB after bath on entry  Transfers Overall transfer level: Needs assistance Equipment used: Rolling walker (2 wheeled) Transfers: Sit to/from Stand Sit to Stand: Min guard         General transfer comment: min guard for safety, vc for hand placement and scooting to EoB before power up, able to power up and steady  herself with RW, noticible shakiness on coming to standing, vc for weightshift before starting ambulation  Ambulation/Gait Ambulation/Gait assistance: Min assist Gait Distance (Feet): 15 Feet Assistive device: Rolling walker (2 wheeled) Gait Pattern/deviations: Step-through pattern;Shuffle;Trunk flexed Gait velocity: slowed Gait velocity interpretation: <1.31 ft/sec, indicative of household ambulator General Gait Details: min A for steadying with RW, repeated tactile and verbal cuing for upright posture and proximity to RW     Balance Overall balance assessment: Needs assistance Sitting-balance support: No upper extremity supported;Feet supported Sitting balance-Leahy Scale: Good     Standing balance support: Bilateral upper extremity supported;During functional activity Standing balance-Leahy Scale: Poor Standing balance comment: requires RW assist to maintain balance                             Pertinent Vitals/Pain Pain Assessment: No/denies pain    Home Living Family/patient expects to be discharged to:: Private residence Living Arrangements: Alone Available Help at Discharge: Family;Available 24 hours/day Type of Home: Apartment Home Access: Level entry     Home Layout: One level Home Equipment: Walker - 2 wheels;Cane - single point;Hand held shower head      Prior Function Level of Independence: Independent with assistive device(s)         Comments: independent in mobility with RW and ADLs, family takes her to store and Dr.'s appointments        Extremity/Trunk Assessment   Upper Extremity Assessment Upper Extremity Assessment: Generalized weakness    Lower Extremity Assessment Lower Extremity Assessment: Generalized weakness  Cervical / Trunk Assessment Cervical / Trunk Assessment: Kyphotic  Communication   Communication: No difficulties  Cognition Arousal/Alertness: Awake/alert Behavior During Therapy: WFL for tasks  assessed/performed Overall Cognitive Status: Within Functional Limits for tasks assessed                                        General Comments General comments (skin integrity, edema, etc.): Pt reports weakness from not eating.         Assessment/Plan    PT Assessment Patient needs continued PT services  PT Problem List Decreased strength;Decreased activity tolerance;Decreased balance;Decreased mobility       PT Treatment Interventions DME instruction;Gait training;Functional mobility training;Therapeutic activities;Therapeutic exercise;Balance training;Patient/family education    PT Goals (Current goals can be found in the Care Plan section)  Acute Rehab PT Goals Patient Stated Goal: go home PT Goal Formulation: With patient Time For Goal Achievement: 08/06/18 Potential to Achieve Goals: Good    Frequency Min 3X/week    AM-PAC PT "6 Clicks" Mobility  Outcome Measure Help needed turning from your back to your side while in a flat bed without using bedrails?: A Little Help needed moving from lying on your back to sitting on the side of a flat bed without using bedrails?: A Little Help needed moving to and from a bed to a chair (including a wheelchair)?: A Little Help needed standing up from a chair using your arms (e.g., wheelchair or bedside chair)?: A Little Help needed to walk in hospital room?: A Little Help needed climbing 3-5 steps with a railing? : A Lot 6 Click Score: 17    End of Session Equipment Utilized During Treatment: Gait belt Activity Tolerance: Patient limited by fatigue Patient left: in chair;with call bell/phone within reach Nurse Communication: Mobility status PT Visit Diagnosis: Unsteadiness on feet (R26.81);Other abnormalities of gait and mobility (R26.89);Muscle weakness (generalized) (M62.81);Difficulty in walking, not elsewhere classified (R26.2)    Time: 3343-5686 PT Time Calculation (min) (ACUTE ONLY): 19 min   Charges:    PT Evaluation $PT Eval Moderate Complexity: 1 Mod          Jakeem Grape B. Migdalia Dk PT, DPT Acute Rehabilitation Services Pager [phone] Office [phone]   [place] 07/23/2018, 10:29 AM

## 2018-07-23 NOTE — Evaluation (Signed)
Occupational Therapy Evaluation Patient Details Name: Brenda Alexander MRN: [id number] DOB: [birth year] Today's Date: 07/23/2018    History of Present Illness 84 y.o. female with medical history significant of CAD, NIDDM, hypertension, hyperlipidemia, prior hysterectomy presenting to the hospital for evaluation of vomiting.  Patient reports having multiple episodes of bilious, nonbloody emesis for the past 1 day. CT abdomen pelvis showing small bowel obstruction at the mid ileal level. CT of also revealed abdomen pelvis with a finding of suspected mass in the anterior urinary bladder wall as well as porceline gallbladder.   Clinical Impression   Pt is an 84 yo female admitted with vomiting and bloody stool. Pt currently with functional limitiations due to the deficits: in strength, activity tolerance and safe mobility. Pt limited by BP post exertion 195/90 taken in both arms with similar readings. RN aware and managing with meds. Pt performing UB ADL with set-upA; transfers with Minguard to MinA and ADL functional mobility with RW and MinA for support as pt's BLEs with increased shakiness. Pt with intention tremors in BUEs. NG tube in place. No pain reported or dizziness. Pt  Pt will benefit from skilled OT to increase their independence and safety with adls and balance to allow discharge to SNF.     Follow Up Recommendations  SNF    Equipment Recommendations  3 in 1 bedside commode    Recommendations for Other Services       Precautions / Restrictions Precautions Precautions: None Precaution Comments: NG tube placed Restrictions Weight Bearing Restrictions: No      Mobility Bed Mobility               General bed mobility comments: Sitting up in recliner upon arrival  Transfers Overall transfer level: Needs assistance Equipment used: Rolling walker (2 wheeled) Transfers: Sit to/from Stand Sit to Stand: Min guard         General transfer comment: Minguard to minA for  safety and proper hand placement    Balance Overall balance assessment: Needs assistance Sitting-balance support: No upper extremity supported;Feet supported Sitting balance-Leahy Scale: Good     Standing balance support: Bilateral upper extremity supported;During functional activity Standing balance-Leahy Scale: Poor Standing balance comment: requires RW assist to maintain balance                           ADL either performed or assessed with clinical judgement   ADL Overall ADL's : Needs assistance/impaired Eating/Feeding: Set up   Grooming: Wash/dry hands;Wash/dry face;Oral care;Applying deodorant;Sitting;Cueing for safety;Min guard   Upper Body Bathing: Minimal assistance;Sitting   Lower Body Bathing: Moderate assistance;Sit to/from stand   Upper Body Dressing : Min guard;Sitting   Lower Body Dressing: Moderate assistance;Sit to/from stand   Toilet Transfer: Min guard;BSC   Toileting- Water quality scientist and Hygiene: Min guard;Minimal assistance;Sit to/from stand       Functional mobility during ADLs: Minimal assistance;Cueing for sequencing;Rolling walker General ADL Comments: Increased time required and BP 190/95 in BUEs taken after exertion     Vision Baseline Vision/History: No visual deficits Patient Visual Report: No change from baseline Vision Assessment?: No apparent visual deficits     Perception     Praxis      Pertinent Vitals/Pain Pain Assessment: No/denies pain     Hand Dominance Right   Extremity/Trunk Assessment Upper Extremity Assessment Upper Extremity Assessment: Overall WFL for tasks assessed;Generalized weakness(intention tremors present)   Lower Extremity Assessment Lower Extremity Assessment: Overall WFL for tasks  assessed   Cervical / Trunk Assessment Cervical / Trunk Assessment: Kyphotic   Communication Communication Communication: No difficulties   Cognition Arousal/Alertness: Awake/alert Behavior During  Therapy: WFL for tasks assessed/performed Overall Cognitive Status: Within Functional Limits for tasks assessed                                     General Comments  BP 190/95 after exertion. RN aware    Exercises     Shoulder Instructions      Home Living Family/patient expects to be discharged to:: Private residence Living Arrangements: Alone Available Help at Discharge: Family;Available PRN/intermittently Type of Home: Apartment Home Access: Level entry     Home Layout: One level     Bathroom Shower/Tub: Teacher, early years/pre: Standard Bathroom Accessibility: Yes   Home Equipment: Walker - 2 wheels;Cane - single point;Hand held shower head(elevated seat)          Prior Functioning/Environment Level of Independence: Independent with assistive device(s)        Comments: independent in mobility with RW and ADLs. IADLs provided by family for pt        OT Problem List: Decreased strength;Decreased activity tolerance;Impaired balance (sitting and/or standing);Decreased safety awareness;Pain;Decreased coordination      OT Treatment/Interventions: Self-care/ADL training;Therapeutic exercise;Energy conservation;Therapeutic activities;Patient/family education;Balance training    OT Goals(Current goals can be found in the care plan section) Acute Rehab OT Goals Patient Stated Goal: go home OT Goal Formulation: With patient Time For Goal Achievement: 07/23/18 Potential to Achieve Goals: Good ADL Goals Pt Will Perform Lower Body Dressing: with set-up Pt Will Transfer to Toilet: with supervision Additional ADL Goal #1: Pt will increase to [place] for bed mobility and ADL functional transfers.  OT Frequency: Min 2X/week   Barriers to D/C: Decreased caregiver support          Co-evaluation              AM-PAC OT "6 Clicks" Daily Activity     Outcome Measure Help from another person eating meals?: None Help from another  person taking care of personal grooming?: A Little Help from another person toileting, which includes using toliet, bedpan, or urinal?: A Lot Help from another person bathing (including washing, rinsing, drying)?: A Lot Help from another person to put on and taking off regular upper body clothing?: A Little Help from another person to put on and taking off regular lower body clothing?: A Lot 6 Click Score: 16   End of Session Equipment Utilized During Treatment: Gait belt;Rolling walker Nurse Communication: Mobility status  Activity Tolerance: Treatment limited secondary to medical complications (Comment)(HTN) Patient left: in bed;with bed alarm set;with call bell/phone within reach  OT Visit Diagnosis: Unsteadiness on feet (R26.81);Muscle weakness (generalized) (M62.81)                Time: 2297-9892 OT Time Calculation (min): 35 min Charges:  OT General Charges $OT Visit: 1 Visit OT Evaluation $OT Eval Moderate Complexity: 1 Mod OT Treatments $Self Care/Home Management : 8-22 mins  Ebony Hail Harold Hedge) Marsa Aris OTR/L Acute Rehabilitation Services Pager: [phone] Office: [phone]   Fredda Hammed 07/23/2018, 3:01 PM

## 2018-07-24 LAB — CBC WITH DIFFERENTIAL/PLATELET
Abs Immature Granulocytes: 0.06 10*3/uL (ref 0.00–0.07)
Basophils Absolute: 0 10*3/uL (ref 0.0–0.1)
Basophils Relative: 0 %
Eosinophils Absolute: 0 10*3/uL (ref 0.0–0.5)
Eosinophils Relative: 0 %
HCT: 41.7 % (ref 36.0–46.0)
Hemoglobin: 12.8 g/dL (ref 12.0–15.0)
Immature Granulocytes: 1 %
Lymphocytes Relative: 19 %
Lymphs Abs: 1.5 10*3/uL (ref 0.7–4.0)
MCH: 25.4 pg — ABNORMAL LOW (ref 26.0–34.0)
MCHC: 30.7 g/dL (ref 30.0–36.0)
MCV: 82.9 fL (ref 80.0–100.0)
Monocytes Absolute: 1.1 10*3/uL — ABNORMAL HIGH (ref 0.1–1.0)
Monocytes Relative: 13 %
Neutro Abs: 5.5 10*3/uL (ref 1.7–7.7)
Neutrophils Relative %: 67 %
Platelets: 269 10*3/uL (ref 150–400)
RBC: 5.03 MIL/uL (ref 3.87–5.11)
RDW: 15.9 % — ABNORMAL HIGH (ref 11.5–15.5)
WBC: 8.1 10*3/uL (ref 4.0–10.5)
nRBC: 0 % (ref 0.0–0.2)

## 2018-07-24 LAB — GLUCOSE, CAPILLARY
Glucose-Capillary: 77 mg/dL (ref 70–99)
Glucose-Capillary: 91 mg/dL (ref 70–99)
Glucose-Capillary: 96 mg/dL (ref 70–99)

## 2018-07-24 LAB — COMPREHENSIVE METABOLIC PANEL
ALT: 23 U/L (ref 0–44)
AST: 40 U/L (ref 15–41)
Albumin: 3.5 g/dL (ref 3.5–5.0)
Alkaline Phosphatase: 69 U/L (ref 38–126)
Anion gap: 11 (ref 5–15)
BUN: 13 mg/dL (ref 8–23)
CO2: 22 mmol/L (ref 22–32)
Calcium: 9.2 mg/dL (ref 8.9–10.3)
Chloride: 109 mmol/L (ref 98–111)
Creatinine, Ser: 0.81 mg/dL (ref 0.44–1.00)
GFR calc Af Amer: >60 mL/min (ref >60)
GFR calc non Af Amer: >60 mL/min (ref >60)
Glucose, Bld: 132 mg/dL — ABNORMAL HIGH (ref 70–99)
Potassium: 3.9 mmol/L (ref 3.5–5.1)
Sodium: 142 mmol/L (ref 135–145)
Total Bilirubin: 0.8 mg/dL (ref 0.3–1.2)
Total Protein: 6.7 g/dL (ref 6.5–8.1)

## 2018-07-24 LAB — T4, FREE: Free T4: 1.01 ng/dL (ref 0.82–1.77)

## 2018-07-24 LAB — MAGNESIUM: Magnesium: 1.8 mg/dL (ref 1.7–2.4)

## 2018-07-24 LAB — LACTIC ACID, PLASMA: Lactic Acid, Venous: 1.1 mmol/L (ref 0.5–1.9)

## 2018-07-24 LAB — TSH: TSH: 0.657 u[IU]/mL (ref 0.350–4.500)

## 2018-07-24 MED ORDER — AMLODIPINE BESYLATE 5 MG PO TABS
5.0000 mg | ORAL_TABLET | Freq: Every day | ORAL | Status: DC
  Administered 2018-07-24 – 2018-07-25 (×2): 5 mg via ORAL
  Filled 2018-07-24 (×2): qty 1

## 2018-07-24 MED ORDER — FERROUS SULFATE 325 (65 FE) MG PO TABS
325.0000 mg | ORAL_TABLET | Freq: Two times a day (BID) | ORAL | Status: DC
  Filled 2018-07-24: qty 1

## 2018-07-24 MED ORDER — METOPROLOL TARTRATE 5 MG/5ML IV SOLN: 5.0000 mg | Freq: Four times a day (QID) | INTRAVENOUS | Status: DC | PRN

## 2018-07-24 MED ORDER — METOPROLOL SUCCINATE ER 50 MG PO TB24
50.0000 mg | ORAL_TABLET | Freq: Every day | ORAL | Status: DC
  Administered 2018-07-24 – 2018-07-25 (×2): 50 mg via ORAL
  Filled 2018-07-24 (×2): qty 1

## 2018-07-24 MED ORDER — CLONIDINE HCL 0.2 MG PO TABS
0.2000 mg | ORAL_TABLET | Freq: Two times a day (BID) | ORAL | Status: DC
  Administered 2018-07-24 – 2018-07-25 (×3): 0.2 mg via ORAL
  Filled 2018-07-24 (×3): qty 1

## 2018-07-24 MED ORDER — DOCUSATE SODIUM 100 MG PO CAPS
100.0000 mg | ORAL_CAPSULE | Freq: Two times a day (BID) | ORAL | Status: DC
  Administered 2018-07-24 – 2018-07-25 (×3): 100 mg via ORAL
  Filled 2018-07-24 (×3): qty 1

## 2018-07-24 MED ORDER — FERROUS SULFATE 325 (65 FE) MG PO TABS
325.0000 mg | ORAL_TABLET | Freq: Two times a day (BID) | ORAL | Status: DC
  Administered 2018-07-24 – 2018-07-25 (×2): 325 mg via ORAL
  Filled 2018-07-24 (×2): qty 1

## 2018-07-24 MED ORDER — SIMVASTATIN 20 MG PO TABS
20.0000 mg | ORAL_TABLET | Freq: Every day | ORAL | Status: DC
  Administered 2018-07-24: 20 mg via ORAL
  Filled 2018-07-24: qty 1

## 2018-07-24 NOTE — Progress Notes (Signed)
Nutrition Brief Note  Patient identified on the Malnutrition Screening Tool (MST) Report. She had reported unintentional wt loss. A comment on MST states pt has gone" from 236# to 189# just this year".   There is no information to support this in chart, though available EMR information only goes back to June. From available information, her wt is stable vs up x 6 months.   Since her diet was advanced, pt has been eating 100% of [place] meals.  On RD arrival, patient asleep. Given her excellent intake and weight stability, RD did not feel it necessary to disturb patient.   No nutrition interventions warranted at this time. If nutrition issues arise, please consult RD.   Burtis Junes RD, LDN, CNSC Clinical Nutrition Available Tues-Sat via Pager: [phone] 07/24/2018 5:57 PM

## 2018-07-24 NOTE — Progress Notes (Signed)
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 If pt tolerates diet today she will be okay for discharge from our standpoint.   LOS: 2 days    Subjective: CC: SBO  No issues overnight. Daughter at bedside. No nausea or vomiting. She had a BM. Throat is sore from tube.   Objective: Vital signs in last 24 hours: Temp:  [99.2 F (37.3 C)-99.6 F (37.6 C)] 99.6 F (37.6 C) (12/28 0540) Pulse Rate:  [63-127] 127 (12/28 0540) Resp:  [18-21] 18 (12/28 0540) BP: (157-190)/(68-96) 165/89 (12/28 0540) SpO2:  [95 %-100 %] 100 % (12/28 0540) Last BM Date: 07/20/18  Intake/Output from previous day: 12/27 0701 - 12/28 0700 In: 982.6 [I.V.:982.6] Out: 150 [Emesis/NG output:150] Intake/Output this shift: No intake/output data recorded.  PE: Gen:  Alert, NAD, pleasant, cooperative Pulm:  Rate and effort normal Abd: Soft, NT/ND, +BS Skin: warm and dry   Anti-infectives: Anti-infectives (From admission, onward)   None      Lab Results:  Recent Labs    07/22/18 1520 07/23/18 0212  WBC 10.1 10.4  HGB 13.7 12.4  HCT 44.4 39.2  PLT 243 248   BMET Recent Labs    07/22/18 1520 07/23/18 0212  NA 136 139  K 3.1* 3.2*  CL 98 100  CO2 23 27  GLUCOSE 167* 108*  BUN 14 14  CREATININE 0.90 0.84  CALCIUM 10.4* 9.1   PT/INR No results for input(s): LABPROT, INR in the last 72 hours. CMP     Component Value Date/Time   NA 139 07/23/2018 0212   K 3.2 (L) 07/23/2018 0212   CL 100 07/23/2018 0212   CO2 27  07/23/2018 0212   GLUCOSE 108 (H) 07/23/2018 0212   BUN 14 07/23/2018 0212   CREATININE 0.84 07/23/2018 0212   CALCIUM 9.1 07/23/2018 0212   PROT 8.0 07/22/2018 1520   ALBUMIN 4.5 07/22/2018 1520   AST 41 07/22/2018 1520   ALT 20 07/22/2018 1520   ALKPHOS 82 07/22/2018 1520   BILITOT 0.7 07/22/2018 1520   GFRNONAA >60 07/23/2018 0212   GFRAA >60 07/23/2018 0212   Lipase     Component Value Date/Time   LIPASE 26 07/22/2018 1520    Studies/Results: Ct Abdomen Pelvis W Contrast  Result Date: 07/22/2018 CLINICAL DATA:  Abdominal pain and emesis EXAM: CT ABDOMEN AND PELVIS WITH CONTRAST TECHNIQUE: Multidetector CT imaging of the abdomen and pelvis was performed using the standard protocol following bolus administration of intravenous contrast. CONTRAST:  163mL OMNIPAQUE IOHEXOL 300 MG/ML  SOLN COMPARISON:  January 12, 2018 FINDINGS: Lower chest: There is mild posterior right base atelectasis. Lung bases otherwise are clear. There are foci of coronary artery calcification. There is a small hiatal hernia. Hepatobiliary: No focal liver lesions are appreciated. There is evidence of porcelain gallbladder with diffuse calcification of the wall of the gallbladder. The gallbladder is contracted. No biliary duct dilatation is evident. Pancreas: There is no appreciable pancreatic mass or inflammatory focus. Pancreas is mildly atrophic. Spleen: No splenic lesions are evident. Adrenals/Urinary Tract:  Adrenals bilaterally appear unremarkable. There is a cyst arising from the posterior mid right kidney measuring 1.0 x 1.0 cm. There is a cyst arising from the lateral upper pole left kidney measuring 3.8 x 3.6 cm. A cyst arising from the posterior upper to mid left kidney measures 2.7 x 2.7 cm. There is no appreciable hydronephrosis on either side. There is no evident renal or ureteral calculus on either side. Urinary bladder is partially decompressed. There is asymmetric thickening of the anterior urinary bladder  wall with a questionable mass occupying the anterior urinary bladder. Stomach/Bowel: There is dilatation of the stomach diffusely. Stomach is filled with fluid and air. There is dilatation of most of the small bowel loops with a transition zone in the mid ileal region consistent with a degree of small bowel obstruction. There is no evident free air or portal venous air. Vascular/Lymphatic: There is aortoiliac atherosclerosis. No aneurysm is evident. There is patchy calcification in major mesenteric arterial vessels without major mesenteric arterial vessel obstruction. No adenopathy is appreciable in the abdomen or pelvis. Reproductive: Uterus is absent.  No pelvic mass evident. Other: There is no periappendiceal region inflammatory change. There is no abscess in the abdomen or pelvis. There is focal ascites in the dependent portion of the pelvis. Musculoskeletal: There is degenerative change in the lower thoracic and lumbar region. There is vacuum phenomenon at multiple levels. There are no blastic or lytic bone lesions. There is moderate spinal stenosis at L4-5 due to diffuse disc protrusion and bony hypertrophy. There are no intramuscular or abdominal wall lesions evident. IMPRESSION: 1. Small bowel obstruction at the mid ileal level. No free air. No diverticulitis evident. 2. No abscess evident in the abdomen or pelvis. Relatively mild ascites in the dependent portion the pelvis may be sympathetic in response to the small-bowel obstruction. 3. Suspect mass arising in the anterior urinary bladder wall. Urinary bladder is largely decompressed, and this area is difficult to evaluate with certainty. This finding is felt to warrant urinalysis and urologic consultation. 4. Apparent porcelain gallbladder. Porcelain gallbladder is a risk factor for gallbladder carcinoma development. 5 There is aortoiliac artery atherosclerosis. There is also mesenteric and coronary artery calcifications. 6.  Uterus absent. 7. Moderate  spinal stenosis at L4-5, multifactorial. Multilevel lumbar arthropathy. 8.  Small hiatal hernia. Electronically Signed   By: Lowella Grip III M.D.   On: 07/22/2018 17:51   Dg Abd Portable 1v-small Bowel Obstruction Protocol-initial, 8 Hr Delay  Result Date: 07/23/2018 CLINICAL DATA:  Small-bowel obstruction, 8 hour film EXAM: PORTABLE ABDOMEN - 1 VIEW COMPARISON:  CT 07/22/2018 and abdomen radiographs 07/23/2018 at 8:19 a.m. FINDINGS: Enteric contrast is seen from cecum through rectum. No significant small bowel dilatation is identified. Gastric tube projects over the left upper quadrant. Faintly visualized porcelain gallbladder is noted. IMPRESSION: 1. Enteric contrast is seen from cecum through rectum. No significant small bowel dilatation is identified. 2. Right upper quadrant calcification consistent with suspected porcelain gallbladder. Electronically Signed   By: Ashley Royalty M.D.   On: 07/23/2018 21:18   Dg Abd Portable 1v  Result Date: 07/23/2018 CLINICAL DATA:  84 year old female NG tube adjustment. EXAM: PORTABLE ABDOMEN - 1 VIEW COMPARISON:  07/22/2018. FINDINGS: Portable AP supine view at 0819 hours. Enteric tube courses through the mediastinum to the abdomen and projects to the left of midline, terminating at the level of the gastric body. Side hole at the level of the gastric body. Negative lung bases. Visible bowel gas pattern within normal limits.  Porcelain gallbladder redemonstrated. IMPRESSION: 1. Enteric tube tip and side hole at the level of the gastric body. 2. Improved bowel gas pattern since 07/22/2018. 3. Porcelain gallbladder re-demonstrated. Electronically Signed   By: Genevie Ann M.D.   On: 07/23/2018 08:31   Dg Abd Portable 1v  Result Date: 07/22/2018 CLINICAL DATA:  Check gastric catheter placement EXAM: PORTABLE ABDOMEN - 1 VIEW COMPARISON:  None. FINDINGS: Scattered large and small bowel gas is noted. Gastric catheter is noted looped upon itself within the stomach with  the tip in the distal esophagus. This should be withdrawn slightly and readvanced. IMPRESSION: Gastric catheter looped upon itself within the stomach with the tip in the distal esophagus. This should be withdrawn slightly and readvanced. Electronically Signed   By: Inez Catalina M.D.   On: 07/22/2018 20:19      Kalman Drape , [facility] 07/24/2018, 7:43 AM  Pager: [phone] Mon-Wed, Friday 7:00am-4:30pm Thurs 7am-11:30am  Consults: [phone]

## 2018-07-24 NOTE — Plan of Care (Signed)
  Problem: Education: °Goal: Knowledge of General Education information will improve °Description: Including pain rating scale, medication(s)/side effects and non-pharmacologic comfort measures °Outcome: Progressing °  °Problem: Pain Managment: °Goal: General experience of comfort will improve °Outcome: Progressing °  °Problem: Safety: °Goal: Ability to remain free from injury will improve °Outcome: Progressing °  °Problem: Activity: °Goal: Risk for activity intolerance will decrease °Outcome: Progressing °  °

## 2018-07-24 NOTE — Progress Notes (Signed)
Pt's SBP not going down to 150, hydralazin PRN given per sched. Pt remained asymptomatic all night. RN will notify MD today.   Pt tolerated clear liquid tonight. NGT clamped.

## 2018-07-24 NOTE — Progress Notes (Addendum)
PROGRESS NOTE    Brenda Alexander  NLZ:[id number] DOB: 1932-08-22 DOA: 07/22/2018 PCP: Nolene Ebbs, MD   Brief Narrative:  Per HPI: Taronda Comacho is a 84 y.o. female with medical history significant of CAD, NIDDM, hypertension, hyperlipidemia, prior hysterectomy presenting to the hospital for evaluation of vomiting.  Patient reports having multiple episodes of bilious, nonbloody emesis for the past 1 day.  She has not been able to tolerate p.o. intake.  States she previously had pain in her right lower quadrant but no abdominal pain at present.  Denies having any diarrhea.  No fevers or chills.  Patient was admitted with small bowel obstruction and some hypokalemia.  She unfortunately had poor placement of her NG tube and this has been readjusted this morning by general surgery.  Continue to follow output and replete potassium.  Assessment & Plan:   Principal Problem:   SBO (small bowel obstruction) (HCC) Active Problems:   HTN (hypertension)   Leukocytosis   Hypokalemia   Type 2 diabetes mellitus (HCC)   Porcelain gallbladder   Physical deconditioning  SBO Presents with nausea and vomiting.  Abdominal distention. CT abdomen shows evidence of small bowel obstruction. General surgery was consulted. Patient was treated conservatively with NG tube, IV fluids and IV supportive medications. On 07/23/2018 NG tube was clamped and patient was advanced diet. Now that the patient is tolerating diet and having bowel movements NG tube removed. General surgery signed off.  Borderline leukocytosis -Stable  Mild hypokalemia Stable.  Magnesium stable.  Monitor.  Replaced.  Non-insulin-dependent diabetes mellitus -Blood glucose currently controlled  Continue sliding scale insulin.  Hypertension Sinus tachycardia Blood pressure elevated. Patient's home medications were kept on hold since admission. Patient is on clonidine, Lopressor, amlodipine, lisinopril as well as diuretic at  home. At present we will hold the ACE inhibitor and diuretic and resume other medication monitor blood pressure. TSH free T4, electrolytes all stable.  Lactic acid normal.  Bladder mass CT abdomen pelvis with a finding of suspected mass in the anterior urinary bladder wall.  UA with evidence of microscopic hematuria (small amount of hemoglobin on dipstick and 21-50 RBCs/ HPF on microscopic examination).  CT abdomen pelvis done in June 2019 also mentioned a focal area of bladder wall thickening/mass-effect concerning for urothelial neoplasm.  Per daughters at bedside, patient was seen by Dr. Lovena Neighbours at [facility] urology in either July or August 2019 and work-up at that time including cystoscopy was nonrevealing.  Family states Dr. Lovena Neighbours had referred the patient to gynecology at that time and she was subsequently found to have a uterine wall mass for which she underwent hysterectomy. Recommend follow-up with urologist Dr. Lovena Neighbours outpatient.  Porcelain gallbladder Seen on CT abdomen.  Risk factor for gallbladder carcinoma development.  Per Dr. Kae Heller from general surgery, no surgical plans for the gallbladder appearance. -Outpatient follow-up  with general surgery  Physical deconditioning -PT evaluation recommends home health.  OT recommends SNF.  Patient would like to go home.  DVT prophylaxis: Heparin Code Status: Full code Family Communication: None at bedside Disposition Plan: Severe tachycardia severe hypertension.  Resuming home medication and monitor.   Consultants:   General surgery  Procedures:   None  Antimicrobials:   None   Subjective: Denies any acute complaint.  Report anxiety.  No nausea no vomiting.  Had a bowel movement.  No abdominal pain but no chest pain.  Objective: Vitals:   07/24/18 0100 07/24/18 0230 07/24/18 0540 07/24/18 1512  BP: (!) 175/79 (!) 159/68 Marland Kitchen)  165/89 114/61  Pulse: (!) 119 (!) 111 (!) 127 89  Resp:   18 19  Temp:   99.6 F (37.6 C) 99.2  F (37.3 C)  TempSrc:   Oral Oral  SpO2:   100% 98%    Intake/Output Summary (Last 24 hours) at 07/24/2018 1619 Last data filed at 07/24/2018 1511 Gross per 24 hour  Intake 600 ml  Output -  Net 600 ml   There were no vitals filed for this visit.  Examination:  General exam: Appears calm and comfortable  Respiratory system: Clear to auscultation. Respiratory effort normal. Cardiovascular system: S1 & S2 heard, RRR. No JVD, murmurs, rubs, gallops or clicks. No pedal edema. Gastrointestinal system: Abdomen is nondistended, soft and nontender. No organomegaly or masses felt.  Hypoactive bowel sounds.   Central nervous system: Alert and oriented. No focal neurological deficits. Extremities: Symmetric 5 x 5 power. Skin: No rashes, lesions or ulcers Psychiatry: Judgement and insight appear normal. Mood & affect appropriate.     Data Reviewed: I have personally reviewed following labs and imaging studies  CBC: Recent Labs  Lab 07/22/18 1520 07/23/18 0212 07/24/18 0905  WBC 10.1 10.4 8.1  NEUTROABS  --   --  5.5  HGB 13.7 12.4 12.8  HCT 44.4 39.2 41.7  MCV 81.3 81.8 82.9  PLT 243 248 097   Basic Metabolic Panel: Recent Labs  Lab 07/22/18 1520 07/22/18 2233 07/23/18 0212 07/24/18 0905  NA 136  --  139 142  K 3.1*  --  3.2* 3.9  CL 98  --  100 109  CO2 23  --  27 22  GLUCOSE 167*  --  108* 132*  BUN 14  --  14 13  CREATININE 0.90  --  0.84 0.81  CALCIUM 10.4*  --  9.1 9.2  MG  --  1.9  --  1.8   GFR: CrCl cannot be calculated (Unknown ideal weight.). Liver Function Tests: Recent Labs  Lab 07/22/18 1520 07/24/18 0905  AST 41 40  ALT 20 23  ALKPHOS 82 69  BILITOT 0.7 0.8  PROT 8.0 6.7  ALBUMIN 4.5 3.5   Recent Labs  Lab 07/22/18 1520  LIPASE 26   No results for input(s): AMMONIA in the last 168 hours. Coagulation Profile: No results for input(s): INR, PROTIME in the last 168 hours. Cardiac Enzymes: No results for input(s): CKTOTAL, CKMB,  CKMBINDEX, TROPONINI in the last 168 hours. BNP (last 3 results) No results for input(s): PROBNP in the last 8760 hours. HbA1C: No results for input(s): HGBA1C in the last 72 hours. CBG: Recent Labs  Lab 07/23/18 1004 07/23/18 1629 07/24/18 0057 07/24/18 0810  GLUCAP 97 90 77 91   Lipid Profile: No results for input(s): CHOL, HDL, LDLCALC, TRIG, CHOLHDL, LDLDIRECT in the last 72 hours. Thyroid Function Tests: Recent Labs    07/24/18 0905  TSH 0.657  FREET4 1.01   Anemia Panel: No results for input(s): VITAMINB12, FOLATE, FERRITIN, TIBC, IRON, RETICCTPCT in the last 72 hours. Sepsis Labs: Recent Labs  Lab 07/24/18 0905  LATICACIDVEN 1.1    No results found for this or any previous visit (from the past 240 hour(s)).       Radiology Studies: Ct Abdomen Pelvis W Contrast  Result Date: 07/22/2018 CLINICAL DATA:  Abdominal pain and emesis EXAM: CT ABDOMEN AND PELVIS WITH CONTRAST TECHNIQUE: Multidetector CT imaging of the abdomen and pelvis was performed using the standard protocol following bolus administration of intravenous contrast. CONTRAST:  139mL  OMNIPAQUE IOHEXOL 300 MG/ML  SOLN COMPARISON:  January 12, 2018 FINDINGS: Lower chest: There is mild posterior right base atelectasis. Lung bases otherwise are clear. There are foci of coronary artery calcification. There is a small hiatal hernia. Hepatobiliary: No focal liver lesions are appreciated. There is evidence of porcelain gallbladder with diffuse calcification of the wall of the gallbladder. The gallbladder is contracted. No biliary duct dilatation is evident. Pancreas: There is no appreciable pancreatic mass or inflammatory focus. Pancreas is mildly atrophic. Spleen: No splenic lesions are evident. Adrenals/Urinary Tract: Adrenals bilaterally appear unremarkable. There is a cyst arising from the posterior mid right kidney measuring 1.0 x 1.0 cm. There is a cyst arising from the lateral upper pole left kidney measuring 3.8 x  3.6 cm. A cyst arising from the posterior upper to mid left kidney measures 2.7 x 2.7 cm. There is no appreciable hydronephrosis on either side. There is no evident renal or ureteral calculus on either side. Urinary bladder is partially decompressed. There is asymmetric thickening of the anterior urinary bladder wall with a questionable mass occupying the anterior urinary bladder. Stomach/Bowel: There is dilatation of the stomach diffusely. Stomach is filled with fluid and air. There is dilatation of most of the small bowel loops with a transition zone in the mid ileal region consistent with a degree of small bowel obstruction. There is no evident free air or portal venous air. Vascular/Lymphatic: There is aortoiliac atherosclerosis. No aneurysm is evident. There is patchy calcification in major mesenteric arterial vessels without major mesenteric arterial vessel obstruction. No adenopathy is appreciable in the abdomen or pelvis. Reproductive: Uterus is absent.  No pelvic mass evident. Other: There is no periappendiceal region inflammatory change. There is no abscess in the abdomen or pelvis. There is focal ascites in the dependent portion of the pelvis. Musculoskeletal: There is degenerative change in the lower thoracic and lumbar region. There is vacuum phenomenon at multiple levels. There are no blastic or lytic bone lesions. There is moderate spinal stenosis at L4-5 due to diffuse disc protrusion and bony hypertrophy. There are no intramuscular or abdominal wall lesions evident. IMPRESSION: 1. Small bowel obstruction at the mid ileal level. No free air. No diverticulitis evident. 2. No abscess evident in the abdomen or pelvis. Relatively mild ascites in the dependent portion the pelvis may be sympathetic in response to the small-bowel obstruction. 3. Suspect mass arising in the anterior urinary bladder wall. Urinary bladder is largely decompressed, and this area is difficult to evaluate with certainty. This  finding is felt to warrant urinalysis and urologic consultation. 4. Apparent porcelain gallbladder. Porcelain gallbladder is a risk factor for gallbladder carcinoma development. 5 There is aortoiliac artery atherosclerosis. There is also mesenteric and coronary artery calcifications. 6.  Uterus absent. 7. Moderate spinal stenosis at L4-5, multifactorial. Multilevel lumbar arthropathy. 8.  Small hiatal hernia. Electronically Signed   By: Lowella Grip III M.D.   On: 07/22/2018 17:51   Dg Abd Portable 1v-small Bowel Obstruction Protocol-initial, 8 Hr Delay  Result Date: 07/23/2018 CLINICAL DATA:  Small-bowel obstruction, 8 hour film EXAM: PORTABLE ABDOMEN - 1 VIEW COMPARISON:  CT 07/22/2018 and abdomen radiographs 07/23/2018 at 8:19 a.m. FINDINGS: Enteric contrast is seen from cecum through rectum. No significant small bowel dilatation is identified. Gastric tube projects over the left upper quadrant. Faintly visualized porcelain gallbladder is noted. IMPRESSION: 1. Enteric contrast is seen from cecum through rectum. No significant small bowel dilatation is identified. 2. Right upper quadrant calcification consistent with suspected porcelain  gallbladder. Electronically Signed   By: Ashley Royalty M.D.   On: 07/23/2018 21:18   Dg Abd Portable 1v  Result Date: 07/23/2018 CLINICAL DATA:  84 year old female NG tube adjustment. EXAM: PORTABLE ABDOMEN - 1 VIEW COMPARISON:  07/22/2018. FINDINGS: Portable AP supine view at 0819 hours. Enteric tube courses through the mediastinum to the abdomen and projects to the left of midline, terminating at the level of the gastric body. Side hole at the level of the gastric body. Negative lung bases. Visible bowel gas pattern within normal limits. Porcelain gallbladder redemonstrated. IMPRESSION: 1. Enteric tube tip and side hole at the level of the gastric body. 2. Improved bowel gas pattern since 07/22/2018. 3. Porcelain gallbladder re-demonstrated. Electronically Signed    By: Genevie Ann M.D.   On: 07/23/2018 08:31   Dg Abd Portable 1v  Result Date: 07/22/2018 CLINICAL DATA:  Check gastric catheter placement EXAM: PORTABLE ABDOMEN - 1 VIEW COMPARISON:  None. FINDINGS: Scattered large and small bowel gas is noted. Gastric catheter is noted looped upon itself within the stomach with the tip in the distal esophagus. This should be withdrawn slightly and readvanced. IMPRESSION: Gastric catheter looped upon itself within the stomach with the tip in the distal esophagus. This should be withdrawn slightly and readvanced. Electronically Signed   By: Inez Catalina M.D.   On: 07/22/2018 20:19        Scheduled Meds: . amLODipine  5 mg Oral Daily  . cloNIDine  0.2 mg Oral BID  . cycloSPORINE  1 drop Both Eyes BID  . docusate sodium  100 mg Oral BID  . ferrous sulfate  325 mg Oral BID WC  . heparin  5,000 Units Subcutaneous Q8H  . mouth rinse  15 mL Mouth Rinse BID  . metoprolol succinate  50 mg Oral Daily  . simvastatin  20 mg Oral QHS   Continuous Infusions:    LOS: 2 days    Time spent: 30 minutes   Author:  Berle Mull, MD [facility] Pager: [phone] 07/24/2018 4:23 PM     If 7PM-7AM, please contact night-coverage [email or web address] Password [email or web address] 07/24/2018, 4:19 PM

## 2018-07-25 LAB — BASIC METABOLIC PANEL
Anion gap: 6 (ref 5–15)
BUN: 10 mg/dL (ref 8–23)
CO2: 28 mmol/L (ref 22–32)
Calcium: 8.7 mg/dL — ABNORMAL LOW (ref 8.9–10.3)
Chloride: 106 mmol/L (ref 98–111)
Creatinine, Ser: 0.76 mg/dL (ref 0.44–1.00)
GFR calc Af Amer: >60 mL/min (ref >60)
GFR calc non Af Amer: >60 mL/min (ref >60)
Glucose, Bld: 104 mg/dL — ABNORMAL HIGH (ref 70–99)
Potassium: 3.9 mmol/L (ref 3.5–5.1)
Sodium: 140 mmol/L (ref 135–145)

## 2018-07-25 LAB — CBC
HCT: 34.5 % — ABNORMAL LOW (ref 36.0–46.0)
Hemoglobin: 10.7 g/dL — ABNORMAL LOW (ref 12.0–15.0)
MCH: 25.7 pg — ABNORMAL LOW (ref 26.0–34.0)
MCHC: 31 g/dL (ref 30.0–36.0)
MCV: 82.7 fL (ref 80.0–100.0)
Platelets: 199 10*3/uL (ref 150–400)
RBC: 4.17 MIL/uL (ref 3.87–5.11)
RDW: 15.9 % — ABNORMAL HIGH (ref 11.5–15.5)
WBC: 5.1 10*3/uL (ref 4.0–10.5)
nRBC: 0 % (ref 0.0–0.2)

## 2018-07-25 LAB — GLUCOSE, CAPILLARY
Glucose-Capillary: 91 mg/dL (ref 70–99)
Glucose-Capillary: 90 mg/dL (ref 70–99)

## 2018-07-25 LAB — MAGNESIUM: Magnesium: 1.8 mg/dL (ref 1.7–2.4)

## 2018-07-25 NOTE — Discharge Summary (Signed)
Physician Discharge Summary  Brenda Alexander TDV:[id number] DOB: 1932-10-29 DOA: 07/22/2018  PCP: Nolene Ebbs, MD  Admit date: 07/22/2018 Discharge date: 07/25/2018  Admitted From: Home Disposition:  Home  Discharge Condition:Stable CODE STATUS:FULL Diet recommendation: Heart Healthy   Brief/Interim Summary: [name] a 84 y.o.femalewith medical history significant ofCAD,NIDDM, hypertension, hyperlipidemia, prior hysterectomypresenting to the hospital for evaluation of vomiting.Patient reported having multiple episodes of bilious, nonbloody emesis. She had not been able to tolerate p.o. intake.  Imagings on presentation showed a small bowel obstruction.  Seen by general surgery.  Started on conservative management.  Currently her SBO has resolved.  She is having bowel movements.  She is tolerating diet .  She is stable for discharge to home today.  Following problems were addressed during her hospitalization: SBO Presents with nausea and vomiting.  Abdominal distention. CT abdomen shows evidence of small bowel obstruction. General surgery was consulted. Patient was treated conservatively with NG tube, IV fluids and IV supportive medications. On 07/23/2018 NG tube was clamped and patient was advanced diet. Now that the patient is tolerating diet and having bowel movements NG tube removed. General surgery signed off.  Borderline leukocytosis -Stable  Mild hypokalemia Stable.  Magnesium stable.  Monitor.  Replaced.  Non-insulin-dependent diabetes mellitus -Blood glucose currently controlled   Hypertension Sinus tachycardia Much better this morning. Patient is on clonidine, Lopressor, amlodipine, lisinopril as well as diuretic at home.  Bladder mass CT abdomen pelvis withafinding of suspected mass in the anterior urinary bladder wall. UA with evidence of microscopic hematuria (small amount of hemoglobin on dipstick and 21-50 RBCs/ HPFon microscopic  examination).CT abdomen pelvis done in June 2019 also mentioned a focal area of bladder wall thickening/mass-effect concerning for urothelial neoplasm. Per daughters at bedside,[facility] and work-up at that time including cystoscopy was nonrevealing. Family states Dr. Lovena Neighbours had referred the patient to gynecology at that time and she was subsequently found to haveauterine wall mass for which she underwent hysterectomy. Recommend follow-up with urologist Dr. Lovena Neighbours outpatient.  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 Per Dr. Kae Heller from general surgery, no surgical plans for the gallbladder appearance. -Outpatient follow-up with general surgery  Physical deconditioning -PT evaluation recommends home health.  OT recommends SNF.  Patient would like to go home.  Case manager will be consulted for arranging home health.    Discharge Diagnoses:  Principal Problem:   SBO (small bowel obstruction) (HCC) Active Problems:   HTN (hypertension)   Leukocytosis   Hypokalemia   Type 2 diabetes mellitus ([place])   Porcelain gallbladder   Physical deconditioning    Discharge Instructions  Discharge Instructions    Diet - low sodium heart healthy   Complete by:  As directed    Discharge instructions   Complete by:  As directed    1)Monitor your blood pressure at home.Continue your home medications. 2)Follow up with your PCP in a week. 3)Follow up with Dr. Lovena Neighbours, urology, as an outpatient in 2 weeks.   Increase activity slowly   Complete by:  As directed      Allergies as of 07/25/2018      Reactions   Other Anaphylaxis, Swelling   Walnuts = Throat swells   Penicillins Swelling, Rash   Has patient had a PCN reaction causing immediate rash, facial/tongue/throat swelling, SOB or lightheadedness with hypotension: Yes Has patient had a PCN reaction causing severe  rash involving mucus membranes or skin  necrosis: Unk Has patient had a PCN reaction that required hospitalization: Unk Has patient had a PCN reaction occurring within the last 10 years: No If all of the above answers are "NO", then may proceed with Cephalosporin use.      Medication List    TAKE these medications   acetaminophen 500 MG tablet Commonly known as:  TYLENOL Take 1,000 mg by mouth every 6 (six) hours as needed (for pain).   amLODipine 5 MG tablet Commonly known as:  NORVASC Take 5 mg by mouth daily.   CALCIUM + D3 PO Take 1 tablet by mouth daily.   cloNIDine 0.2 MG tablet Commonly known as:  CATAPRES Take 0.2 mg by mouth 2 (two) times daily.   cycloSPORINE 0.05 % ophthalmic emulsion Commonly known as:  RESTASIS Place 1 drop into both eyes 2 (two) times daily.   docusate sodium 100 MG capsule Commonly known as:  COLACE Take 1 capsule (100 mg total) by mouth 2 (two) times daily as needed for mild constipation or moderate constipation.   enalapril 20 MG tablet Commonly known as:  VASOTEC Take 20 mg by mouth 2 (two) times daily.   ferrous sulfate 325 (65 FE) MG EC tablet Take 1 tablet (325 mg total) by mouth 2 (two) times daily.   furosemide 40 MG tablet Commonly known as:  LASIX Take 40 mg by mouth daily.   isosorbide mononitrate 20 MG tablet Commonly known as:  ISMO,MONOKET Take 20 mg by mouth daily.   metoprolol succinate 50 MG 24 hr tablet Commonly known as:  TOPROL-XL Take 50 mg by mouth daily.   potassium chloride SA 20 MEQ tablet Commonly known as:  K-DUR,KLOR-CON Take 20 mEq by mouth daily.   simvastatin 20 MG tablet Commonly known as:  ZOCOR Take 20 mg by mouth at bedtime.   vitamin C 500 MG tablet Commonly known as:  ASCORBIC ACID Take 500 mg by mouth daily.      Follow-up Information    Nolene Ebbs, MD. Schedule an appointment as soon as possible for a visit in 1 week(s).   Specialty:  Internal Medicine Contact  information: [place] [address] [phone]        Ceasar Mons, MD. Schedule an appointment as soon as possible for a visit in 2 week(s).   Specialty:  Urology Contact information: [address] [place] [address] [phone]          Allergies  Allergen Reactions  . Other Anaphylaxis and Swelling    Walnuts = Throat swells  . Penicillins Swelling and Rash    Has patient had a PCN reaction causing immediate rash, facial/tongue/throat swelling, SOB or lightheadedness with hypotension: Yes Has patient had a PCN reaction causing severe rash involving mucus membranes or skin necrosis: Unk Has patient had a PCN reaction that required hospitalization: Unk Has patient had a PCN reaction occurring within the last 10 years: No If all of the above answers are "NO", then may proceed with Cephalosporin use.     Consultations:  General surgery   Procedures/Studies: Ct Abdomen Pelvis W Contrast  Result Date: 07/22/2018 CLINICAL DATA:  Abdominal pain and emesis EXAM: CT ABDOMEN AND PELVIS WITH CONTRAST TECHNIQUE: Multidetector CT imaging of the abdomen and pelvis was performed using the standard protocol following bolus administration of intravenous contrast. CONTRAST:  124mL OMNIPAQUE IOHEXOL 300 MG/ML  SOLN COMPARISON:  January 12, 2018 FINDINGS: Lower chest: There is mild posterior right base atelectasis. Lung  bases otherwise are clear. There are foci of coronary artery calcification. There is a small hiatal hernia. Hepatobiliary: No focal liver lesions are appreciated. There is evidence of porcelain gallbladder with diffuse calcification of the wall of the gallbladder. The gallbladder is contracted. No biliary duct dilatation is evident. Pancreas: There is no appreciable pancreatic mass or inflammatory focus. Pancreas is mildly atrophic. Spleen: No splenic lesions are evident. Adrenals/Urinary Tract: Adrenals bilaterally appear unremarkable.  There is a cyst arising from the posterior mid right kidney measuring 1.0 x 1.0 cm. There is a cyst arising from the lateral upper pole left kidney measuring 3.8 x 3.6 cm. A cyst arising from the posterior upper to mid left kidney measures 2.7 x 2.7 cm. There is no appreciable hydronephrosis on either side. There is no evident renal or ureteral calculus on either side. Urinary bladder is partially decompressed. There is asymmetric thickening of the anterior urinary bladder wall with a questionable mass occupying the anterior urinary bladder. Stomach/Bowel: There is dilatation of the stomach diffusely. Stomach is filled with fluid and air. There is dilatation of most of the small bowel loops with a transition zone in the mid ileal region consistent with a degree of small bowel obstruction. There is no evident free air or portal venous air. Vascular/Lymphatic: There is aortoiliac atherosclerosis. No aneurysm is evident. There is patchy calcification in major mesenteric arterial vessels without major mesenteric arterial vessel obstruction. No adenopathy is appreciable in the abdomen or pelvis. Reproductive: Uterus is absent.  No pelvic mass evident. Other: There is no periappendiceal region inflammatory change. There is no abscess in the abdomen or pelvis. There is focal ascites in the dependent portion of the pelvis. Musculoskeletal: There is degenerative change in the lower thoracic and lumbar region. There is vacuum phenomenon at multiple levels. There are no blastic or lytic bone lesions. There is moderate spinal stenosis at L4-5 due to diffuse disc protrusion and bony hypertrophy. There are no intramuscular or abdominal wall lesions evident. IMPRESSION: 1. Small bowel obstruction at the mid ileal level. No free air. No diverticulitis evident. 2. No abscess evident in the abdomen or pelvis. Relatively mild ascites in the dependent portion the pelvis may be sympathetic in response to the small-bowel obstruction. 3.  Suspect mass arising in the anterior urinary bladder wall. Urinary bladder is largely decompressed, and this area is difficult to evaluate with certainty. This finding is felt to warrant urinalysis and urologic consultation. 4. Apparent porcelain gallbladder. Porcelain gallbladder is a risk factor for gallbladder carcinoma development. 5 There is aortoiliac artery atherosclerosis. There is also mesenteric and coronary artery calcifications. 6.  Uterus absent. 7. Moderate spinal stenosis at L4-5, multifactorial. Multilevel lumbar arthropathy. 8.  Small hiatal hernia. Electronically Signed   By: Lowella Grip III M.D.   On: 07/22/2018 17:51   Dg Abd Portable 1v-small Bowel Obstruction Protocol-initial, 8 Hr Delay  Result Date: 07/23/2018 CLINICAL DATA:  Small-bowel obstruction, 8 hour film EXAM: PORTABLE ABDOMEN - 1 VIEW COMPARISON:  CT 07/22/2018 and abdomen radiographs 07/23/2018 at 8:19 a.m. FINDINGS: Enteric contrast is seen from cecum through rectum. No significant small bowel dilatation is identified. Gastric tube projects over the left upper quadrant. Faintly visualized porcelain gallbladder is noted. IMPRESSION: 1. Enteric contrast is seen from cecum through rectum. No significant small bowel dilatation is identified. 2. Right upper quadrant calcification consistent with suspected porcelain gallbladder. Electronically Signed   By: Ashley Royalty M.D.   On: 07/23/2018 21:18   Dg Abd Portable 1v  Result Date: 07/23/2018 CLINICAL DATA:  84 year old female NG tube adjustment. EXAM: PORTABLE ABDOMEN - 1 VIEW COMPARISON:  07/22/2018. FINDINGS: Portable AP supine view at 0819 hours. Enteric tube courses through the mediastinum to the abdomen and projects to the left of midline, terminating at the level of the gastric body. Side hole at the level of the gastric body. Negative lung bases. Visible bowel gas pattern within normal limits. Porcelain gallbladder redemonstrated. IMPRESSION: 1. Enteric tube tip and  side hole at the level of the gastric body. 2. Improved bowel gas pattern since 07/22/2018. 3. Porcelain gallbladder re-demonstrated. Electronically Signed   By: Genevie Ann M.D.   On: 07/23/2018 08:31   Dg Abd Portable 1v  Result Date: 07/22/2018 CLINICAL DATA:  Check gastric catheter placement EXAM: PORTABLE ABDOMEN - 1 VIEW COMPARISON:  None. FINDINGS: Scattered large and small bowel gas is noted. Gastric catheter is noted looped upon itself within the stomach with the tip in the distal esophagus. This should be withdrawn slightly and readvanced. IMPRESSION: Gastric catheter looped upon itself within the stomach with the tip in the distal esophagus. This should be withdrawn slightly and readvanced. Electronically Signed   By: Inez Catalina M.D.   On: 07/22/2018 20:19       Subjective: Patient seen and examined the bedside this afternoon.  Remains comfortable.  Blood pressure better today.  Denies any abdominal pain.  Having bowel movements.  Tolerating diet.  Discussed discharge planning with the daughter at the bedside.  Discharge Exam: Vitals:   07/25/18 0608 07/25/18 1316  BP: (!) 173/82 (!) 148/86  Pulse: 72 83  Resp:    Temp:  98.4 F (36.9 C)  SpO2:  98%   Vitals:   07/24/18 2107 07/25/18 0606 07/25/18 0608 07/25/18 1316  BP: (!) 160/80 (!) 174/81 (!) 173/82 (!) 148/86  Pulse: 91 77 72 83  Resp: 20 20    Temp: 98.4 F (36.9 C) 98.5 F (36.9 C)  98.4 F (36.9 C)  TempSrc: Oral Oral  Oral  SpO2: 100% 100%  98%    General: Pt is alert, awake, not in acute distress Cardiovascular: RRR, S1/S2 +, no rubs, no gallops Respiratory: CTA bilaterally, no wheezing, no rhonchi Abdominal: Soft, NT, ND, bowel sounds + Extremities: no edema, no cyanosis    The results of significant diagnostics from this hospitalization (including imaging, microbiology, ancillary and laboratory) are listed below for reference.     Microbiology: No results found for this or any previous visit (from  the past 240 hour(s)).   Labs: BNP (last 3 results) No results for input(s): BNP in the last 8760 hours. Basic Metabolic Panel: Recent Labs  Lab 07/22/18 1520 07/22/18 2233 07/23/18 0212 07/24/18 0905 07/25/18 0531  NA 136  --  139 142 140  K 3.1*  --  3.2* 3.9 3.9  CL 98  --  100 109 106  CO2 23  --  27 22 28   GLUCOSE 167*  --  108* 132* 104*  BUN 14  --  14 13 10   CREATININE 0.90  --  0.84 0.81 0.76  CALCIUM 10.4*  --  9.1 9.2 8.7*  MG  --  1.9  --  1.8 1.8   Liver Function Tests: Recent Labs  Lab 07/22/18 1520 07/24/18 0905  AST 41 40  ALT 20 23  ALKPHOS 82 69  BILITOT 0.7 0.8  PROT 8.0 6.7  ALBUMIN 4.5 3.5   Recent Labs  Lab 07/22/18 1520  LIPASE 26  No results for input(s): AMMONIA in the last 168 hours. CBC: Recent Labs  Lab 07/22/18 1520 07/23/18 0212 07/24/18 0905 07/25/18 0531  WBC 10.1 10.4 8.1 5.1  NEUTROABS  --   --  5.5  --   HGB 13.7 12.4 12.8 10.7*  HCT 44.4 39.2 41.7 34.5*  MCV 81.3 81.8 82.9 82.7  PLT 243 248 269 199   Cardiac Enzymes: No results for input(s): CKTOTAL, CKMB, CKMBINDEX, TROPONINI in the last 168 hours. BNP: Invalid input(s): POCBNP CBG: Recent Labs  Lab 07/24/18 0057 07/24/18 0810 07/24/18 1623 07/25/18 0210 07/25/18 0757  GLUCAP 77 91 96 91 90   D-Dimer No results for input(s): DDIMER in the last 72 hours. Hgb A1c No results for input(s): HGBA1C in the last 72 hours. Lipid Profile No results for input(s): CHOL, HDL, LDLCALC, TRIG, CHOLHDL, LDLDIRECT in the last 72 hours. Thyroid function studies Recent Labs    07/24/18 0905  TSH 0.657   Anemia work up No results for input(s): VITAMINB12, FOLATE, FERRITIN, TIBC, IRON, RETICCTPCT in the last 72 hours. Urinalysis    Component Value Date/Time   COLORURINE YELLOW 07/22/2018 1709   APPEARANCEUR HAZY (A) 07/22/2018 1709   LABSPEC 1.019 07/22/2018 1709   PHURINE 5.0 07/22/2018 1709   GLUCOSEU 150 (A) 07/22/2018 1709   HGBUR SMALL (A) 07/22/2018 1709    BILIRUBINUR NEGATIVE 07/22/2018 1709   KETONESUR 20 (A) 07/22/2018 1709   PROTEINUR 100 (A) 07/22/2018 1709   NITRITE NEGATIVE 07/22/2018 1709   LEUKOCYTESUR NEGATIVE 07/22/2018 1709   Sepsis Labs Invalid input(s): PROCALCITONIN,  WBC,  LACTICIDVEN Microbiology No results found for this or any previous visit (from the past 240 hour(s)).  Please note: You were cared for by a hospitalist during your hospital stay. Once you are discharged, your primary care physician will handle any further medical issues. Please note that NO REFILLS for any discharge medications will be authorized once you are discharged, as it is imperative that you return to your primary care physician (or establish a relationship with a primary care physician if you do not have one) for your post hospital discharge needs so that they can reassess your need for medications and monitor your lab values.    Time coordinating discharge: 40 minutes  SIGNED:   Shelly Coss, MD  [facility] 07/25/2018, 2:57 PM Pager [phone]  If 7PM-7AM, please contact night-coverage [email or web address] Password [email or web address]

## 2018-07-25 NOTE — Care Management Note (Signed)
Case Management Note  Patient Details  Name: Brenda Alexander MRN: [id number] Date of Birth: 1932-08-15  Subjective/Objective:     Pt to return home with assistance of daughters.  Pt lives alone and daughters request to add home health aide and PCS through [place].  Pt's daughters also request 3n1, RW, and tub bench.                Action/Plan: Medicare list presented to patient and daughters.  They choose [facility] home health.  Referral called to [facility] with [facility].  Tommi Rumps will assist family in setting up PCS through Medicaid.    [facility] does not have walkers to deliver to patient.  Patient's family given printed orders for DME and will obtain DME  from [facility] store tomorrow.   Expected Discharge Date:  07/25/18               Expected Discharge Plan:  [place]  In-House Referral:  NA  Discharge planning Services  CM Consult  Post Acute Care Choice:  Durable Medical Equipment, Home Health Choice offered to:  Patient  DME Arranged:  3-N-1, Walker rolling, Tub bench DME Agency:  [place] Arranged:  PT, OT, Nurse's Aide [place] Agency:  [place]  Status of Service:  Completed, signed off  If discussed at [place] of Stay Meetings, dates discussed:    Additional Comments:  Claudie Leach, RN 07/25/2018, 4:13 PM

## 2018-08-25 ENCOUNTER — Emergency Department (HOSPITAL_COMMUNITY): Payer: Medicare HMO

## 2018-08-25 ENCOUNTER — Inpatient Hospital Stay (HOSPITAL_COMMUNITY)
Admission: EM | Admit: 2018-08-25 | Discharge: 2018-08-30 | DRG: 336 | Disposition: A | Discharge status: Home Health | Payer: Medicare HMO | Attending: Surgery | Admitting: Surgery

## 2018-08-25 ENCOUNTER — Encounter (HOSPITAL_COMMUNITY): Payer: Self-pay

## 2018-08-25 ENCOUNTER — Inpatient Hospital Stay (HOSPITAL_COMMUNITY): Payer: Medicare HMO

## 2018-08-25 DIAGNOSIS — Z8543 Personal history of malignant neoplasm of ovary: Secondary | ICD-10-CM

## 2018-08-25 DIAGNOSIS — Z9841 Cataract extraction status, right eye: Secondary | ICD-10-CM

## 2018-08-25 DIAGNOSIS — Z823 Family history of stroke: Secondary | ICD-10-CM | POA: Diagnosis not present

## 2018-08-25 DIAGNOSIS — I351 Nonrheumatic aortic (valve) insufficiency: Secondary | ICD-10-CM | POA: Diagnosis not present

## 2018-08-25 DIAGNOSIS — I5030 Unspecified diastolic (congestive) heart failure: Secondary | ICD-10-CM | POA: Diagnosis present

## 2018-08-25 DIAGNOSIS — Z8249 Family history of ischemic heart disease and other diseases of the circulatory system: Secondary | ICD-10-CM

## 2018-08-25 DIAGNOSIS — K5651 Intestinal adhesions [bands], with partial obstruction: Secondary | ICD-10-CM | POA: Diagnosis present

## 2018-08-25 DIAGNOSIS — R188 Other ascites: Secondary | ICD-10-CM | POA: Diagnosis present

## 2018-08-25 DIAGNOSIS — C55 Malignant neoplasm of uterus, part unspecified: Secondary | ICD-10-CM | POA: Diagnosis present

## 2018-08-25 DIAGNOSIS — K56609 Unspecified intestinal obstruction, unspecified as to partial versus complete obstruction: Secondary | ICD-10-CM | POA: Diagnosis present

## 2018-08-25 DIAGNOSIS — I4891 Unspecified atrial fibrillation: Secondary | ICD-10-CM | POA: Diagnosis not present

## 2018-08-25 DIAGNOSIS — M19041 Primary osteoarthritis, right hand: Secondary | ICD-10-CM | POA: Diagnosis present

## 2018-08-25 DIAGNOSIS — M170 Bilateral primary osteoarthritis of knee: Secondary | ICD-10-CM | POA: Diagnosis present

## 2018-08-25 DIAGNOSIS — I110 Hypertensive heart disease with heart failure: Secondary | ICD-10-CM | POA: Diagnosis present

## 2018-08-25 DIAGNOSIS — Z8542 Personal history of malignant neoplasm of other parts of uterus: Secondary | ICD-10-CM

## 2018-08-25 DIAGNOSIS — E119 Type 2 diabetes mellitus without complications: Secondary | ICD-10-CM | POA: Diagnosis present

## 2018-08-25 DIAGNOSIS — E785 Hyperlipidemia, unspecified: Secondary | ICD-10-CM | POA: Diagnosis present

## 2018-08-25 DIAGNOSIS — E876 Hypokalemia: Secondary | ICD-10-CM | POA: Diagnosis not present

## 2018-08-25 DIAGNOSIS — M19042 Primary osteoarthritis, left hand: Secondary | ICD-10-CM | POA: Diagnosis present

## 2018-08-25 DIAGNOSIS — Z66 Do not resuscitate: Secondary | ICD-10-CM | POA: Diagnosis present

## 2018-08-25 DIAGNOSIS — Z90710 Acquired absence of both cervix and uterus: Secondary | ICD-10-CM

## 2018-08-25 DIAGNOSIS — Z880 Allergy status to penicillin: Secondary | ICD-10-CM

## 2018-08-25 DIAGNOSIS — I2510 Atherosclerotic heart disease of native coronary artery without angina pectoris: Secondary | ICD-10-CM | POA: Diagnosis present

## 2018-08-25 DIAGNOSIS — I10 Essential (primary) hypertension: Secondary | ICD-10-CM | POA: Diagnosis present

## 2018-08-25 DIAGNOSIS — Z0189 Encounter for other specified special examinations: Secondary | ICD-10-CM

## 2018-08-25 LAB — LIPASE, BLOOD: Lipase: 19 U/L (ref 11–51)

## 2018-08-25 LAB — CBG MONITORING, ED: Glucose-Capillary: 130 mg/dL — ABNORMAL HIGH (ref 70–99)

## 2018-08-25 LAB — URINALYSIS, ROUTINE W REFLEX MICROSCOPIC
Bilirubin Urine: NEGATIVE
Glucose, UA: 150 mg/dL — AB
Ketones, ur: 5 mg/dL — AB
Leukocytes, UA: NEGATIVE
Nitrite: NEGATIVE
Protein, ur: 100 mg/dL — AB
Specific Gravity, Urine: 1.024 (ref 1.005–1.030)
pH: 5 (ref 5.0–8.0)

## 2018-08-25 LAB — COMPREHENSIVE METABOLIC PANEL
ALT: 63 U/L — ABNORMAL HIGH (ref 0–44)
AST: 87 U/L — ABNORMAL HIGH (ref 15–41)
Albumin: 3.5 g/dL (ref 3.5–5.0)
Alkaline Phosphatase: 194 U/L — ABNORMAL HIGH (ref 38–126)
Anion gap: 13 (ref 5–15)
BUN: 15 mg/dL (ref 8–23)
CO2: 25 mmol/L (ref 22–32)
Calcium: 9.7 mg/dL (ref 8.9–10.3)
Chloride: 97 mmol/L — ABNORMAL LOW (ref 98–111)
Creatinine, Ser: 0.72 mg/dL (ref 0.44–1.00)
GFR calc Af Amer: >60 mL/min (ref >60)
GFR calc non Af Amer: >60 mL/min (ref >60)
Glucose, Bld: 169 mg/dL — ABNORMAL HIGH (ref 70–99)
Potassium: 3.6 mmol/L (ref 3.5–5.1)
Sodium: 135 mmol/L (ref 135–145)
Total Bilirubin: 1 mg/dL (ref 0.3–1.2)
Total Protein: 7.2 g/dL (ref 6.5–8.1)

## 2018-08-25 LAB — CBC
HCT: 40.6 % (ref 36.0–46.0)
Hemoglobin: 13 g/dL (ref 12.0–15.0)
MCH: 26 pg (ref 26.0–34.0)
MCHC: 32 g/dL (ref 30.0–36.0)
MCV: 81.2 fL (ref 80.0–100.0)
Platelets: 254 10*3/uL (ref 150–400)
RBC: 5 MIL/uL (ref 3.87–5.11)
RDW: 16.7 % — ABNORMAL HIGH (ref 11.5–15.5)
WBC: 8.9 10*3/uL (ref 4.0–10.5)
nRBC: 0 % (ref 0.0–0.2)

## 2018-08-25 LAB — SURGICAL PCR SCREEN
MRSA, PCR: NEGATIVE
Staphylococcus aureus: NEGATIVE

## 2018-08-25 LAB — GLUCOSE, CAPILLARY: Glucose-Capillary: 123 mg/dL — ABNORMAL HIGH (ref 70–99)

## 2018-08-25 MED ORDER — OXYCODONE HCL 5 MG PO TABS: 5.0000 mg | ORAL_TABLET | ORAL | Status: DC | PRN

## 2018-08-25 MED ORDER — ENOXAPARIN SODIUM 40 MG/0.4ML ~~LOC~~ SOLN: 40.0000 mg | SUBCUTANEOUS | Status: DC

## 2018-08-25 MED ORDER — ACETAMINOPHEN 500 MG PO TABS: 1000.0000 mg | ORAL_TABLET | Freq: Four times a day (QID) | ORAL | Status: DC | PRN

## 2018-08-25 MED ORDER — HYDRALAZINE HCL 20 MG/ML IJ SOLN
5.0000 mg | INTRAMUSCULAR | Status: DC | PRN
  Administered 2018-08-25: 5 mg via INTRAVENOUS
  Filled 2018-08-25: qty 1

## 2018-08-25 MED ORDER — INSULIN ASPART 100 UNIT/ML ~~LOC~~ SOLN: 0.0000 [IU] | Freq: Every day | SUBCUTANEOUS | Status: DC

## 2018-08-25 MED ORDER — DIATRIZOATE MEGLUMINE & SODIUM 66-10 % PO SOLN: 90.0000 mL | Freq: Once | ORAL | Status: DC

## 2018-08-25 MED ORDER — INSULIN ASPART 100 UNIT/ML ~~LOC~~ SOLN: 0.0000 [IU] | Freq: Three times a day (TID) | SUBCUTANEOUS | Status: DC

## 2018-08-25 MED ORDER — ACETAMINOPHEN 500 MG PO TABS: 1000.0000 mg | ORAL_TABLET | Freq: Four times a day (QID) | ORAL | Status: DC

## 2018-08-25 MED ORDER — SODIUM CHLORIDE 0.9% FLUSH
3.0000 mL | Freq: Once | INTRAVENOUS | Status: AC
  Administered 2018-08-25: 3 mL via INTRAVENOUS

## 2018-08-25 MED ORDER — SIMVASTATIN 20 MG PO TABS
20.0000 mg | ORAL_TABLET | Freq: Every day | ORAL | Status: DC
  Administered 2018-08-25 – 2018-08-29 (×5): 20 mg via ORAL
  Filled 2018-08-25 (×5): qty 1

## 2018-08-25 MED ORDER — FENTANYL CITRATE (PF) 100 MCG/2ML IJ SOLN
50.0000 ug | Freq: Once | INTRAMUSCULAR | Status: AC
  Administered 2018-08-25: 50 ug via INTRAVENOUS
  Filled 2018-08-25: qty 2

## 2018-08-25 MED ORDER — KETOROLAC TROMETHAMINE 15 MG/ML IJ SOLN: 15.0000 mg | Freq: Four times a day (QID) | INTRAMUSCULAR | Status: DC | PRN

## 2018-08-25 MED ORDER — ONDANSETRON HCL 4 MG/2ML IJ SOLN
4.0000 mg | Freq: Four times a day (QID) | INTRAMUSCULAR | Status: DC | PRN
  Administered 2018-08-25: 4 mg via INTRAVENOUS
  Filled 2018-08-25: qty 2

## 2018-08-25 MED ORDER — CHLORHEXIDINE GLUCONATE CLOTH 2 % EX PADS: 6.0000 | MEDICATED_PAD | Freq: Once | CUTANEOUS | Status: AC

## 2018-08-25 MED ORDER — AMLODIPINE BESYLATE 5 MG PO TABS
5.0000 mg | ORAL_TABLET | Freq: Every day | ORAL | Status: DC
  Administered 2018-08-27 – 2018-08-28 (×2): 5 mg via ORAL
  Filled 2018-08-25 (×2): qty 1

## 2018-08-25 MED ORDER — ONDANSETRON 4 MG PO TBDP: 4.0000 mg | ORAL_TABLET | Freq: Four times a day (QID) | ORAL | Status: DC | PRN

## 2018-08-25 MED ORDER — DIPHENHYDRAMINE HCL 12.5 MG/5ML PO ELIX: 12.5000 mg | ORAL_SOLUTION | Freq: Four times a day (QID) | ORAL | Status: DC | PRN

## 2018-08-25 MED ORDER — KETOROLAC TROMETHAMINE 15 MG/ML IJ SOLN: 15.0000 mg | Freq: Four times a day (QID) | INTRAMUSCULAR | Status: DC

## 2018-08-25 MED ORDER — MORPHINE SULFATE (PF) 4 MG/ML IV SOLN
4.0000 mg | Freq: Once | INTRAVENOUS | Status: DC
  Filled 2018-08-25: qty 1

## 2018-08-25 MED ORDER — METOPROLOL SUCCINATE ER 50 MG PO TB24
50.0000 mg | ORAL_TABLET | Freq: Every day | ORAL | Status: DC
  Administered 2018-08-26 – 2018-08-30 (×5): 50 mg via ORAL
  Filled 2018-08-25 (×7): qty 1

## 2018-08-25 MED ORDER — MORPHINE SULFATE (PF) 2 MG/ML IV SOLN: 2.0000 mg | INTRAVENOUS | Status: DC | PRN

## 2018-08-25 MED ORDER — HYDRALAZINE HCL 20 MG/ML IJ SOLN: 10.0000 mg | INTRAMUSCULAR | Status: DC | PRN

## 2018-08-25 MED ORDER — CYCLOSPORINE 0.05 % OP EMUL
1.0000 [drp] | Freq: Two times a day (BID) | OPHTHALMIC | Status: DC
  Administered 2018-08-25 – 2018-08-30 (×10): 1 [drp] via OPHTHALMIC
  Filled 2018-08-25 (×10): qty 1

## 2018-08-25 MED ORDER — CLONIDINE HCL 0.2 MG PO TABS
0.2000 mg | ORAL_TABLET | Freq: Two times a day (BID) | ORAL | Status: DC
  Administered 2018-08-25 – 2018-08-30 (×9): 0.2 mg via ORAL
  Filled 2018-08-25 (×10): qty 1

## 2018-08-25 MED ORDER — DIPHENHYDRAMINE HCL 50 MG/ML IJ SOLN
12.5000 mg | Freq: Four times a day (QID) | INTRAMUSCULAR | Status: DC | PRN
  Administered 2018-08-25: 12.5 mg via INTRAVENOUS
  Filled 2018-08-25: qty 1

## 2018-08-25 MED ORDER — IOHEXOL 300 MG/ML  SOLN
100.0000 mL | Freq: Once | INTRAMUSCULAR | Status: AC | PRN
  Administered 2018-08-25: 100 mL via INTRAVENOUS

## 2018-08-25 MED ORDER — CHLORHEXIDINE GLUCONATE CLOTH 2 % EX PADS
6.0000 | MEDICATED_PAD | Freq: Once | CUTANEOUS | Status: AC
  Administered 2018-08-25: 6 via TOPICAL

## 2018-08-25 MED ORDER — SODIUM CHLORIDE 0.9 % IV SOLN
INTRAVENOUS | Status: DC
  Administered 2018-08-25 – 2018-08-27 (×3): via INTRAVENOUS

## 2018-08-25 MED ORDER — ENALAPRIL MALEATE 20 MG PO TABS
20.0000 mg | ORAL_TABLET | Freq: Two times a day (BID) | ORAL | Status: DC
  Administered 2018-08-25: 20 mg via ORAL
  Filled 2018-08-25 (×2): qty 1

## 2018-08-25 MED ORDER — ISOSORBIDE MONONITRATE 20 MG PO TABS
20.0000 mg | ORAL_TABLET | Freq: Every day | ORAL | Status: DC
  Administered 2018-08-27 – 2018-08-30 (×4): 20 mg via ORAL
  Filled 2018-08-25 (×6): qty 1

## 2018-08-25 NOTE — ED Triage Notes (Signed)
Pt from home via ems; c/o abd pain since 0600 this am; hospitalized December 26th for bowel obstruction; vomiting that began last night, pt states "I cant keep anything down"; pt had small bowel movement last night, per pt "it wasn't normal", last "normal" BM this past Sunday  50 mcg fentanyl given pta Soft, non rigid, tender upper quadrants 12 lead sinus w/ PACs CBG 196 178/101 hx htn, takes medication HR 95 RR 20 99% RA

## 2018-08-25 NOTE — H&P (Signed)
Will Surgery Consult/Admission Note  Brenda Alexander [birth year]  [id number].    Requesting MD: Dr. Francia Greaves Chief Complaint/Reason for Consult: SBO  HPI:  Patient is a 85 year old female who presents to [facility] with abdominal pain since yesterday. Pain in epigastric region, characterized as cramping and intermittent, non-radiating. Reports associated nausea, vomiting today, last BM was last night which was not normal size for her. She denies flatus today but had some yesterday. Daughter at bedside who works on 4[employer] is requested the pt be placed on 4E. Pt had a hysterectomy last year (05/24/18) for uterine cancer. No other abdominal surgeries. Pt was admitted on 07/22/18 for SBO. CT scan showed Single loop of edematous small bowel in pelvis with top-normal distension of proximal small bowel with air-fluid levels concerning for early partial recurrent small bowel obstruction, less likely ileus.   ROS:  Review of Systems  Constitutional: Negative for chills, diaphoresis and fever.  HENT: Negative for sore throat.   Respiratory: Negative for cough and shortness of breath.   Cardiovascular: Negative for chest pain.  Gastrointestinal: Positive for abdominal pain, nausea and vomiting. Negative for blood in stool, constipation and diarrhea.  Genitourinary: Negative for dysuria.  Skin: Negative for rash.  Neurological: Negative for dizziness and loss of consciousness.  All other systems reviewed and are negative.    Family History  Problem Relation Age of Onset  . Obesity Son   . Hypertension Father   . Stroke Father     Past Medical History:  Diagnosis Date  . Anemia   . Arthritis    knees, hands  . Coronary artery disease   . Diabetes mellitus without complication (HCC)    no meds  . Dyspnea 01/12/2018   w/anemia dx and hospital admit w/blood transfused   . 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 History of blood transfusion 01/12/2018   at [facility]  .  Hyperlipemia   . Hypertension   . SVD (spontaneous vaginal delivery)    x 6 - 5 living children  . Uses walker    rollator  . Wears partial dentures    upper and lower partials    Past Surgical History:  Procedure Laterality Date  . COLONOSCOPY    . EYE SURGERY     cataracts removed right eye  . HYSTEROSCOPY W/D&C N/A 05/06/2018   Procedure: DILATATION AND CURETTAGE /HYSTEROSCOPY;  Surgeon: [name], MD;  Location: [facility];  Service: Gynecology;  Laterality: N/A;  . MULTIPLE TOOTH EXTRACTIONS    . ROBOTIC ASSISTED TOTAL HYSTERECTOMY WITH BILATERAL SALPINGO OOPHERECTOMY Bilateral 05/24/2018   Procedure: XI ROBOTIC ASSISTED TOTAL HYSTERECTOMY WITH BILATERAL SALPINGO OOPHORECTOMY;  Surgeon: [name], MD;  Location: [name];  Service: Gynecology;  Laterality: Bilateral;  . SENTINEL NODE BIOPSY N/A 05/24/2018   Procedure: SENTINEL LYMP  NODE BIOPSY;  Surgeon: [name], MD;  Location: [name];  Service: Gynecology;  Laterality: N/A;    Social History:  reports that she has never smoked. She has never used smokeless tobacco. She reports that she does not drink alcohol or use drugs.  Allergies:  Allergies  Allergen Reactions  . Other Anaphylaxis and Swelling    Walnuts = Throat swells  . Penicillins Swelling and Rash    Has patient had a PCN reaction causing immediate rash, facial/tongue/throat swelling, SOB or lightheadedness with hypotension: Yes Has patient had a PCN reaction causing severe rash involving mucus membranes or skin necrosis: Unk Has patient had a PCN reaction that  required hospitalization: Unk Has patient had a PCN reaction occurring within the last 10 years: No If all of the above answers are "NO", then may proceed with Cephalosporin use.     (Not in a hospital admission)   Blood pressure (!) 181/86, pulse 94, resp. rate 19, height 4\' 11"  (1.499 m), weight 85.3 kg, SpO2 98 %.  Physical Exam Vitals signs and nursing note reviewed.  Constitutional:       General: She is not in acute distress.    Appearance: Normal appearance. She is well-developed. She is not diaphoretic.  HENT:     Head: Normocephalic and atraumatic.     Nose: Nose normal.     Mouth/Throat:     Lips: Pink.     Mouth: Mucous membranes are moist.     Pharynx: No oropharyngeal exudate.  Eyes:     General: No scleral icterus.       Right eye: No discharge.        Left eye: No discharge.     Conjunctiva/sclera: Conjunctivae normal.     Comments: Pupils are equal and round  Neck:     Musculoskeletal: Normal range of motion and neck supple.  Cardiovascular:     Rate and Rhythm: Normal rate and regular rhythm.     Pulses:          Radial pulses are 2+ on the right side and 2+ on the left side.     Heart sounds: Normal heart sounds. No murmur.  Pulmonary:     Effort: Pulmonary effort is normal. No respiratory distress.     Breath sounds: Normal breath sounds. No wheezing, rhonchi or rales.  Abdominal:     General: Bowel sounds are decreased. There is no distension.     Palpations: Abdomen is soft. Abdomen is not rigid. There is no hepatomegaly or splenomegaly.     Tenderness: There is no abdominal tenderness. There is no guarding.  Musculoskeletal: Normal range of motion.        General: No tenderness or deformity.  Skin:    General: Skin is warm and dry.     Findings: No rash.  Neurological:     Mental Status: She is alert and oriented to person, place, and time.  Psychiatric:        Mood and Affect: Mood normal.        Behavior: Behavior normal.     Results for orders placed or performed during the hospital encounter of 08/25/18 (from the past 48 hour(s))  Lipase, blood     Status: None   Collection Time: 08/25/18 11:02 AM  Result Value Ref Range   Lipase 19 11 - 51 U/L    Comment: Performed at [facility], [place] [address] [place], [place] [address]  Comprehensive metabolic panel     Status: Abnormal   Collection Time: 08/25/18 11:02 AM  Result  Value Ref Range   Sodium 135 135 - 145 mmol/L   Potassium 3.6 3.5 - 5.1 mmol/L   Chloride 97 (L) 98 - 111 mmol/L   CO2 25 22 - 32 mmol/L   Glucose, Bld 169 (H) 70 - 99 mg/dL   BUN 15 8 - 23 mg/dL   Creatinine, Ser 0.72 0.44 - 1.00 mg/dL   Calcium 9.7 8.9 - 10.3 mg/dL   Total Protein 7.2 6.5 - 8.1 g/dL   Albumin 3.5 3.5 - 5.0 g/dL   AST 87 (H) 15 - 41 U/L   ALT 63 (H)  0 - 44 U/L   Alkaline Phosphatase 194 (H) 38 - 126 U/L   Total Bilirubin 1.0 0.3 - 1.2 mg/dL   GFR calc non Af Amer >60 >60 mL/min   GFR calc Af Amer >60 >60 mL/min   Anion gap 13 5 - 15    Comment: Performed at [place] [address] [place], [place] [address]  CBC     Status: Abnormal   Collection Time: 08/25/18 11:02 AM  Result Value Ref Range   WBC 8.9 4.0 - 10.5 K/uL   RBC 5.00 3.87 - 5.11 MIL/uL   Hemoglobin 13.0 12.0 - 15.0 g/dL   HCT 40.6 36.0 - 46.0 %   MCV 81.2 80.0 - 100.0 fL   MCH 26.0 26.0 - 34.0 pg   MCHC 32.0 30.0 - 36.0 g/dL   RDW 16.7 (H) 11.5 - 15.5 %   Platelets 254 150 - 400 K/uL   nRBC 0.0 0.0 - 0.2 %    Comment: Performed at [facility], [place] [address] [place], [place] [address]  Urinalysis, Routine w reflex microscopic     Status: Abnormal   Collection Time: 08/25/18 11:02 AM  Result Value Ref Range   Color, Urine AMBER (A) YELLOW    Comment: BIOCHEMICALS MAY BE AFFECTED BY COLOR   APPearance HAZY (A) CLEAR   Specific Gravity, Urine 1.024 1.005 - 1.030   pH 5.0 5.0 - 8.0   Glucose, UA 150 (A) NEGATIVE mg/dL   Hgb urine dipstick MODERATE (A) NEGATIVE   Bilirubin Urine NEGATIVE NEGATIVE   Ketones, ur 5 (A) NEGATIVE mg/dL   Protein, ur 100 (A) NEGATIVE mg/dL   Nitrite NEGATIVE NEGATIVE   Leukocytes, UA NEGATIVE NEGATIVE   RBC / HPF 0-5 0 - 5 RBC/hpf   WBC, UA 0-5 0 - 5 WBC/hpf   Bacteria, UA RARE (A) NONE SEEN   Squamous Epithelial / LPF 0-5 0 - 5   Mucus PRESENT    Hyaline Casts, UA PRESENT     Comment: Performed at [facility], [place] [address]  [place],  [address]   Ct Abdomen Pelvis W Contrast  Result Date: 08/25/2018 CLINICAL DATA:  Abdominal pain beginning at 6 a.m. Recent hospitalization for bowel obstruction. History of endometrial cancer, status post hysterectomy. EXAM: CT ABDOMEN AND PELVIS WITH CONTRAST TECHNIQUE: Multidetector CT imaging of the abdomen and pelvis was performed using the standard protocol following bolus administration of intravenous contrast. CONTRAST:  133mL OMNIPAQUE IOHEXOL 300 MG/ML  SOLN COMPARISON:  CT abdomen and pelvis July 22, 2018 FINDINGS: LOWER CHEST: Lung bases are clear. Included heart size is normal. Mild coronary artery calcifications. No pericardial effusion. HEPATOBILIARY: 2.5 cm calcified gallstone without CT findings of acute cholecystitis. Calcified hepatic granulomas, liver is otherwise normal. PANCREAS: Normal. SPLEEN: Normal. ADRENALS/URINARY TRACT: Kidneys are orthotopic, demonstrating symmetric enhancement. Punctate bilateral nephrolithiasis versus early contrast excretion. No hydronephrosis or solid renal masses. Two homogeneously hypodense benign-appearing cyst LEFT kidney measure to 3.6 cm, 1 cm RIGHT interpolar cyst associated with scarring. The unopacified ureters are normal in course and caliber. Delayed imaging through the kidneys demonstrates symmetric prompt contrast excretion within the proximal urinary collecting system. Urinary bladder is partially distended, no discrete mass identified today though sensitivity decreased by lack of contrast within the bladder. Normal adrenal glands. STOMACH/BOWEL: Evaluation of bowel pathology decreased without oral contrast. Mild colonic diverticulosis. Single loop of small bowel mural thickening dilated to 2.5 cm in the pelvis. Small bowel feces  proximal to the edematous bowel loop. Scattered small bowel air-fluid levels, small bowel dilated to 2.9 cm. The appendix is not discretely identified, however there are no inflammatory changes in the right  lower quadrant. Mild stool distended rectum, unlikely to reflect fecal impaction. VASCULAR/LYMPHATIC: Aortoiliac vessels are normal in course and caliber. Moderate to severe calcific atherosclerosis. No lymphadenopathy by CT size criteria. REPRODUCTIVE: Status post hysterectomy. OTHER: Minimal perihepatic and intraperitoneal pelvic free fluid. MUSCULOSKELETAL: Nonacute. Small fat containing LEFT inguinal hernia with small amount of fluid. Anterior abdominal wall scarring. Grade 1 L4-5 anterolisthesis without spondylolysis. Mild old T12 compression fracture. IMPRESSION: 1. Single loop of edematous small bowel in pelvis with top-normal distension of proximal small bowel with air-fluid levels concerning for early partial recurrent small bowel obstruction, less likely ileus. 2. Small volume ascites. 3. Cholelithiasis without acute cholecystitis. 4. Aortic Atherosclerosis (ICD10-I70.0). Acute findings discussed with and reconfirmed by Dr.PETER MESSICK on 08/25/2018 at 3:44 pm. Electronically Signed   By: Elon Alas M.D.   On: 08/25/2018 15:46      Assessment/Plan Active Problems:   * No active hospital problems. *  HTN DM  CAD - co morbidities managed by internal medicine, appreciate their assistance  SBO - pt with recurrent SBO, last on was roughly 2 month ago - tomorrow to OR for exploratory laparoscopy with Dr. Brantley Stage - NGT to LIWS  FEN: NGT, NPO, IVF VTE: SCD's, lovenox ID: Cipro on call to OR Foley: none Follow up: TBD   Kalman Drape, [facility] 08/25/2018, 4:27 PM Pager: [phone] Consults: [phone] Mon-Fri 7:00 am-4:30 pm Sat-Sun 7:00 am-11:30 am

## 2018-08-25 NOTE — ED Notes (Signed)
Per CT, pt has 3 pts in front of her before she will be transported to CT

## 2018-08-25 NOTE — ED Notes (Signed)
Attempted report 

## 2018-08-25 NOTE — ED Notes (Signed)
Per CT, pt has 4 pts ahead of her

## 2018-08-25 NOTE — ED Provider Notes (Addendum)
[facility] EMERGENCY DEPARTMENT Provider Note   CSN: [id number] Arrival date & time: 08/25/18  1055     History   Chief Complaint Chief Complaint  Patient presents with  . Abdominal Pain    HPI Brenda Alexander is a 85 y.o. female.  85 year old female with prior medical history as detailed below presents for evaluation abdominal pain.  Patient with reported abdominal pain that began last night.  Patient describes epigastric discomfort.  She denies back pain, fever, chest pain, shortness of breath, or other acute complaint.  She does report mild associated nausea.  EMS transported the patient to the ED.  Following EMS transport she reports significant decrease in pain and nausea.  Patient does have a prior history of an SBO.  This was treated nonsurgically.  The history is provided by the patient and medical records.  Abdominal Pain  Pain location:  Epigastric Pain quality: aching and cramping   Pain radiates to:  Does not radiate Pain severity:  Mild Onset quality:  Gradual Duration:  1 day Timing:  Constant Progression:  Waxing and waning Chronicity:  New Relieved by:  Nothing Worsened by:  Nothing Ineffective treatments:  None tried Associated symptoms: no fever     Past Medical History:  Diagnosis Date  . Anemia   . Arthritis    knees, hands  . Coronary artery disease   . Diabetes mellitus without complication (HCC)    no meds  . Dyspnea 01/12/2018   w/anemia dx and hospital admit w/blood transfused   . 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 History of blood transfusion 01/12/2018   at [facility]  . Hyperlipemia   . Hypertension   . SVD (spontaneous vaginal delivery)    x 6 - 5 living children  . Uses walker    rollator  . Wears partial dentures    upper and lower partials    Patient Active Problem List   Diagnosis Date Noted  . SBO (small bowel obstruction) ([place]) 07/22/2018  . Leukocytosis 07/22/2018  .  Hypokalemia 07/22/2018  . Type 2 diabetes mellitus ([place]) 07/22/2018  . 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 Physical deconditioning 07/22/2018  . Endometrial cancer ([place]) 05/24/2018  . 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 PMB (postmenopausal bleeding) 04/09/2018  . Bladder mass   . Symptomatic anemia 01/11/2018  . HTN (hypertension) 01/11/2018  . Coronary artery disease 01/11/2018    Past Surgical History:  Procedure Laterality Date  . COLONOSCOPY    . EYE SURGERY     cataracts removed right eye  . HYSTEROSCOPY W/D&C N/A 05/06/2018   Procedure: DILATATION AND CURETTAGE /HYSTEROSCOPY;  Surgeon: [name], MD;  Location: [facility];  Service: Gynecology;  Laterality: N/A;  . MULTIPLE TOOTH EXTRACTIONS    . ROBOTIC ASSISTED TOTAL HYSTERECTOMY WITH BILATERAL SALPINGO OOPHERECTOMY Bilateral 05/24/2018   Procedure: XI ROBOTIC ASSISTED TOTAL HYSTERECTOMY WITH BILATERAL SALPINGO OOPHORECTOMY;  Surgeon: [name], MD;  Location: [name];  Service: Gynecology;  Laterality: Bilateral;  . SENTINEL NODE BIOPSY N/A 05/24/2018   Procedure: SENTINEL LYMP  NODE BIOPSY;  Surgeon: [name], MD;  Location: [name];  Service: Gynecology;  Laterality: N/A;     OB History    Gravida  6   Para  6   Term  6   Preterm      AB      Living  5     SAB      TAB  Ectopic      Multiple      Live Births  5        Obstetric Comments  Had term stillbirthx1         Home Medications    Prior to Admission medications   Medication Sig Start Date End Date Taking? Authorizing Provider  acetaminophen (TYLENOL) 500 MG tablet Take 1,000 mg by mouth every 6 (six) hours as needed (for pain).     [provider]  amLODipine (NORVASC) 5 MG tablet Take 5 mg by mouth daily.    [provider]  Calcium Carb-Cholecalciferol (CALCIUM + D3 PO) Take 1 tablet by mouth daily.    [provider]  cloNIDine (CATAPRES) 0.2 MG tablet Take 0.2 mg by  mouth 2 (two) times daily.    [provider]  cycloSPORINE (RESTASIS) 0.05 % ophthalmic emulsion Place 1 drop into both eyes 2 (two) times daily.    [provider]  docusate sodium (COLACE) 100 MG capsule Take 1 capsule (100 mg total) by mouth 2 (two) times daily as needed for mild constipation or moderate constipation. Patient not taking: Reported on 07/22/2018 05/06/18   [name], MD  enalapril (VASOTEC) 20 MG tablet Take 20 mg by mouth 2 (two) times daily. 12/23/17   [provider]  ferrous sulfate 325 (65 FE) MG EC tablet Take 1 tablet (325 mg total) by mouth 2 (two) times daily. 01/13/18 01/13/19  Elgergawy, Silver Huguenin, MD  furosemide (LASIX) 40 MG tablet Take 40 mg by mouth daily.    [provider]  isosorbide mononitrate (ISMO,MONOKET) 20 MG tablet Take 20 mg by mouth daily. 12/23/17   [provider]  metoprolol succinate (TOPROL-XL) 50 MG 24 hr tablet Take 50 mg by mouth daily. 12/23/17   [provider]  potassium chloride SA (K-DUR,KLOR-CON) 20 MEQ tablet Take 20 mEq by mouth daily.    [provider]  simvastatin (ZOCOR) 20 MG tablet Take 20 mg by mouth at bedtime.     [provider]  vitamin C (ASCORBIC ACID) 500 MG tablet Take 500 mg by mouth daily.    [provider]    Family History Family History  Problem Relation Age of Onset  . Obesity Son   . Hypertension Father   . Stroke Father     Social History Social History   Tobacco Use  . Smoking status: Never Smoker  . Smokeless tobacco: Never Used  Substance Use Topics  . Alcohol use: Never    Frequency: Never  . Drug use: Never     Allergies   Other and Penicillins   Review of Systems Review of Systems  Constitutional: Negative for fever.  Gastrointestinal: Positive for abdominal pain.  All other systems reviewed and are negative.    Physical Exam Updated Vital Signs Ht 4\' 11"  (1.499 m)   Wt 85.3 kg   LMP  (LMP  Unknown)   BMI 37.97 kg/m   Physical Exam Vitals signs and nursing note reviewed.  Constitutional:      General: She is not in acute distress.    Appearance: She is well-developed.  HENT:     Head: Normocephalic and atraumatic.  Eyes:     Conjunctiva/sclera: Conjunctivae normal.     Pupils: Pupils are equal, round, and reactive to light.  Neck:     Musculoskeletal: Normal range of motion and neck supple.  Cardiovascular:     Rate and Rhythm: Normal rate and regular rhythm.  Heart sounds: Normal heart sounds.  Pulmonary:     Effort: Pulmonary effort is normal. No respiratory distress.     Breath sounds: Normal breath sounds.  Abdominal:     General: Bowel sounds are normal. There is no distension.     Palpations: Abdomen is soft.  Musculoskeletal: Normal range of motion.        General: No deformity.  Skin:    General: Skin is warm and dry.  Neurological:     Mental Status: She is alert and oriented to person, place, and time.      ED Treatments / Results  Labs (all labs ordered are listed, but only abnormal results are displayed) Labs Reviewed  COMPREHENSIVE METABOLIC PANEL - Abnormal; Notable for the following components:      Result Value   Chloride 97 (*)    Glucose, Bld 169 (*)    AST 87 (*)    ALT 63 (*)    Alkaline Phosphatase 194 (*)    All other components within normal limits  CBC - Abnormal; Notable for the following components:   RDW 16.7 (*)    All other components within normal limits  URINALYSIS, ROUTINE W REFLEX MICROSCOPIC - Abnormal; Notable for the following components:   Color, Urine AMBER (*)    APPearance HAZY (*)    Glucose, UA 150 (*)    Hgb urine dipstick MODERATE (*)    Ketones, ur 5 (*)    Protein, ur 100 (*)    Bacteria, UA RARE (*)    All other components within normal limits  LIPASE, BLOOD    EKG None  Radiology Ct Abdomen Pelvis W Contrast  Result Date: 08/25/2018 CLINICAL DATA:  Abdominal pain beginning at 6 a.m.  Recent hospitalization for bowel obstruction. History of endometrial cancer, status post hysterectomy. EXAM: CT ABDOMEN AND PELVIS WITH CONTRAST TECHNIQUE: Multidetector CT imaging of the abdomen and pelvis was performed using the standard protocol following bolus administration of intravenous contrast. CONTRAST:  153mL OMNIPAQUE IOHEXOL 300 MG/ML  SOLN COMPARISON:  CT abdomen and pelvis July 22, 2018 FINDINGS: LOWER CHEST: Lung bases are clear. Included heart size is normal. Mild coronary artery calcifications. No pericardial effusion. HEPATOBILIARY: 2.5 cm calcified gallstone without CT findings of acute cholecystitis. Calcified hepatic granulomas, liver is otherwise normal. PANCREAS: Normal. SPLEEN: Normal. ADRENALS/URINARY TRACT: Kidneys are orthotopic, demonstrating symmetric enhancement. Punctate bilateral nephrolithiasis versus early contrast excretion. No hydronephrosis or solid renal masses. Two homogeneously hypodense benign-appearing cyst LEFT kidney measure to 3.6 cm, 1 cm RIGHT interpolar cyst associated with scarring. The unopacified ureters are normal in course and caliber. Delayed imaging through the kidneys demonstrates symmetric prompt contrast excretion within the proximal urinary collecting system. Urinary bladder is partially distended, no discrete mass identified today though sensitivity decreased by lack of contrast within the bladder. Normal adrenal glands. STOMACH/BOWEL: Evaluation of bowel pathology decreased without oral contrast. Mild colonic diverticulosis. Single loop of small bowel mural thickening dilated to 2.5 cm in the pelvis. Small bowel feces proximal to the edematous bowel loop. Scattered small bowel air-fluid levels, small bowel dilated to 2.9 cm. The appendix is not discretely identified, however there are no inflammatory changes in the right lower quadrant. Mild stool distended rectum, unlikely to reflect fecal impaction. VASCULAR/LYMPHATIC: Aortoiliac vessels are normal  in course and caliber. Moderate to severe calcific atherosclerosis. No lymphadenopathy by CT size criteria. REPRODUCTIVE: Status post hysterectomy. OTHER: Minimal perihepatic and intraperitoneal pelvic free fluid. MUSCULOSKELETAL: Nonacute. Small fat containing LEFT inguinal hernia  with small amount of fluid. Anterior abdominal wall scarring. Grade 1 L4-5 anterolisthesis without spondylolysis. Mild old T12 compression fracture. IMPRESSION: 1. Single loop of edematous small bowel in pelvis with top-normal distension of proximal small bowel with air-fluid levels concerning for early partial recurrent small bowel obstruction, less likely ileus. 2. Small volume ascites. 3. Cholelithiasis without acute cholecystitis. 4. Aortic Atherosclerosis (ICD10-I70.0). Acute findings discussed with and reconfirmed by Dr.Cristoval Teall on 08/25/2018 at 3:44 pm. Electronically Signed   By: Elon Alas M.D.   On: 08/25/2018 15:46    Procedures Procedures (including critical care time)  Medications Ordered in ED Medications  sodium chloride flush (NS) 0.9 % injection 3 mL (3 mLs Intravenous Given 08/25/18 1105)     Initial Impression / Assessment and Plan / ED Course  I have reviewed the triage vital signs and the nursing notes.  Pertinent labs & imaging results that were available during my care of the patient were reviewed by me and considered in my medical decision making (see chart for details).     MDM  Screen complete   Patient's is presenting for evaluation of abdominal pain.  Patient symptoms are consistent with prior episode of SBO.  Patient's CT imaging today suggests early partial SBO with a possible transition point in the pelvis.  Surgery is aware of case and will consult.   Hospitalist service contacted regarding case and will evaluate for admission.   Final Clinical Impressions(s) / ED Diagnoses   Final diagnoses:  SBO (small bowel obstruction) [facility])    ED Discharge Orders    None         Valarie Merino, MD 08/25/18 1548    Valarie Merino, MD 08/25/18 [phone]

## 2018-08-25 NOTE — ED Notes (Signed)
Advanced NG tube 6-7 cm per recommendation of radiologist note in DG abd portable

## 2018-08-25 NOTE — Consult Note (Signed)
Consult Note    Brenda Alexander QMG:[id number] DOB: [birth year] DOA: 08/25/2018  PCP: Nolene Ebbs, MD Consultants:  Karin Golden; Denman George - GYN Patient coming from:  Home - lives alone; NOK: Daughter, [phone]  Chief Complaint: Abdominal pain  HPI: Brenda Alexander is a 85 y.o. female with medical history significant of HTN; HLD; DM; and CAD presenting with abdominal pain.  She developed abdominal pain and n/v with anything she ate.  Symptoms started yesterday or maybe Monday.  These were the same symptoms as during last hospitalization.  She had a TAH in October - she had been SOB, was found to be anemic, transfused, was losing blood vaginally.  Uterine biopsy with cancer.  Referred to Dr. Denman George and had a complete hysterectomy on 10/21.  She reports doing great but her daughter reports decreased PO, a little weaker, slow to progress.  She developed an SBO on 12/26, she had an NG tube placed, and she got better and was hospitalized for 3 days without need for intervention.  She remained weak but otherwise has done well.  She did slide out of bed a couple of times.  She was suggested to have SNF last time and she declined and family agreed, but now they understand that this may be needed.  Her BP has been running "high" since December.  She does not miss medication doses.  She does not have h/o irregular heart beat   ED Course:  H/o TAH last fall.  Admitted in 12/19 with SBO, no surgery.  Similar symptoms now, CT with early possible SBO and possible transition point in the pelvis.  Surgery consult pending.  Review of Systems: As per HPI; otherwise review of systems reviewed and negative.   Ambulatory Status:  Ambulates with a cane or walker  Past Medical History:  Diagnosis Date  . Anemia   . Arthritis    knees, hands  . Coronary artery disease   . Diabetes mellitus without complication (HCC)    no meds  . Dyspnea 01/12/2018   w/anemia dx and hospital admit w/blood transfused   .  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 History of blood transfusion 01/12/2018   at [facility]  . Hyperlipemia   . Hypertension   . SVD (spontaneous vaginal delivery)    x 6 - 5 living children  . Uses walker    rollator  . Wears partial dentures    upper and lower partials    Past Surgical History:  Procedure Laterality Date  . COLONOSCOPY    . EYE SURGERY     cataracts removed right eye  . HYSTEROSCOPY W/D&C N/A 05/06/2018   Procedure: DILATATION AND CURETTAGE /HYSTEROSCOPY;  Surgeon: [name], MD;  Location: [facility];  Service: Gynecology;  Laterality: N/A;  . MULTIPLE TOOTH EXTRACTIONS    . ROBOTIC ASSISTED TOTAL HYSTERECTOMY WITH BILATERAL SALPINGO OOPHERECTOMY Bilateral 05/24/2018   Procedure: XI ROBOTIC ASSISTED TOTAL HYSTERECTOMY WITH BILATERAL SALPINGO OOPHORECTOMY;  Surgeon: [name], MD;  Location: [name];  Service: Gynecology;  Laterality: Bilateral;  . SENTINEL NODE BIOPSY N/A 05/24/2018   Procedure: SENTINEL LYMP  NODE BIOPSY;  Surgeon: [name], MD;  Location: [name];  Service: Gynecology;  Laterality: N/A;    Social History   Socioeconomic History  . Marital status: Widowed    Spouse name: Not on file  . Number of children: Not on file  . Years of education: Not on file  . Highest education level: Not on file  Occupational History  . Occupation: retired  Scientific laboratory technician  . Financial resource strain: Not on file  . Food insecurity:    Worry: Not on file    Inability: Not on file  . Transportation needs:    Medical: Not on file    Non-medical: Not on file  Tobacco Use  . Smoking status: Never Smoker  . Smokeless tobacco: Never Used  Substance and Sexual Activity  . Alcohol use: Never    Frequency: Never  . Drug use: Never  . Sexual activity: Not Currently    Partners: Male    Birth control/protection: Post-menopausal  Lifestyle  . Physical activity:    Days per week: Not on file    Minutes per session: Not on file   . Stress: Not on file  Relationships  . Social connections:    Talks on phone: Not on file    Gets together: Not on file    Attends religious service: Not on file    Active member of club or organization: Not on file    Attends meetings of clubs or organizations: Not on file    Relationship status: Not on file  . Intimate partner violence:    Fear of current or ex partner: Not on file    Emotionally abused: Not on file    Physically abused: Not on file    Forced sexual activity: Not on file  Other Topics Concern  . Not on file  Social History Narrative  . Not on file    Allergies  Allergen Reactions  . Other Anaphylaxis and Swelling    Walnuts = Throat swells  . Penicillins Swelling and Rash    Has patient had a PCN reaction causing immediate rash, facial/tongue/throat swelling, SOB or lightheadedness with hypotension: Yes Has patient had a PCN reaction causing severe rash involving mucus membranes or skin necrosis: Unk Has patient had a PCN reaction that required hospitalization: Unk Has patient had a PCN reaction occurring within the last 10 years: No If all of the above answers are "NO", then may proceed with Cephalosporin use.     Family History  Problem Relation Age of Onset  . Obesity Son   . Hypertension Father   . Stroke Father     Prior to Admission medications   Medication Sig Start Date End Date Taking? Authorizing Provider  acetaminophen (TYLENOL) 500 MG tablet Take 1,000 mg by mouth every 6 (six) hours as needed (for pain).    Yes [provider]  amLODipine (NORVASC) 5 MG tablet Take 5 mg by mouth daily.   Yes [provider]  Calcium Carb-Cholecalciferol (CALCIUM + D3 PO) Take 1 tablet by mouth daily.   Yes [provider]  cloNIDine (CATAPRES) 0.2 MG tablet Take 0.2 mg by mouth 2 (two) times daily.   Yes [provider]  cycloSPORINE (RESTASIS) 0.05 % ophthalmic emulsion Place 1 drop into both eyes 2 (two) times  daily.   Yes [provider]  docusate sodium (COLACE) 100 MG capsule Take 1 capsule (100 mg total) by mouth 2 (two) times daily as needed for mild constipation or moderate constipation. 05/06/18  Yes Anyanwu, Sallyanne Havers, MD  enalapril (VASOTEC) 20 MG tablet Take 20 mg by mouth 2 (two) times daily. 12/23/17  Yes [provider]  ferrous sulfate 325 (65 FE) MG EC tablet Take 1 tablet (325 mg total) by mouth 2 (two) times daily. 01/13/18 01/13/19 Yes Elgergawy, Silver Huguenin, MD  furosemide (LASIX) 40 MG  tablet Take 40 mg by mouth daily.   Yes [provider]  isosorbide mononitrate (ISMO,MONOKET) 20 MG tablet Take 20 mg by mouth daily. 12/23/17  Yes [provider]  metoprolol succinate (TOPROL-XL) 50 MG 24 hr tablet Take 50 mg by mouth daily. 12/23/17  Yes [provider]  potassium chloride SA (K-DUR,KLOR-CON) 20 MEQ tablet Take 20 mEq by mouth daily.   Yes [provider]  simvastatin (ZOCOR) 20 MG tablet Take 20 mg by mouth at bedtime.    Yes [provider]  vitamin C (ASCORBIC ACID) 500 MG tablet Take 500 mg by mouth daily.   Yes [provider]    Physical Exam: Vitals:   08/25/18 1300 08/25/18 1400 08/25/18 1430 08/25/18 1500  BP: (!) 178/88 (!) 181/82 (!) 176/82 (!) 181/86  Pulse: 78 91 90 94  Resp: (!) 23 (!) 23 (!) 23 19  SpO2: 98% 97% 99% 98%  Weight:      Height:         . General:  Appears calm and comfortable and is NAD . Eyes: PERRL, EOMI with chronic right exotropia, normal lids, iris . ENT:  grossly normal hearing, lips & tongue, mmm; appropriate dentition . Neck:  no LAD, masses or thyromegaly . Cardiovascular:  RRR but occasional sinus arrythmia, no r/g, 3-1/4 systolic murmur. No LE edema.  Marland Kitchen Respiratory:   CTA bilaterally with no wheezes/rales/rhonchi.  Normal respiratory effort. . Abdomen:  soft, mildly diffusely tender, ND, hypoactive BS . Skin:  no rash or induration seen on limited  exam . Musculoskeletal:  grossly normal tone BUE/BLE, good ROM, no bony abnormality . Psychiatric:  grossly normal mood and affect, speech fluent and appropriate, AOx3 . Neurologic:  CN 2-12 grossly intact, moves all extremities in coordinated fashion, sensation intact    Radiological Exams on Admission: Ct Abdomen Pelvis W Contrast  Result Date: 08/25/2018 CLINICAL DATA:  Abdominal pain beginning at 6 a.m. Recent hospitalization for bowel obstruction. History of endometrial cancer, status post hysterectomy. EXAM: CT ABDOMEN AND PELVIS WITH CONTRAST TECHNIQUE: Multidetector CT imaging of the abdomen and pelvis was performed using the standard protocol following bolus administration of intravenous contrast. CONTRAST:  165mL OMNIPAQUE IOHEXOL 300 MG/ML  SOLN COMPARISON:  CT abdomen and pelvis July 22, 2018 FINDINGS: LOWER CHEST: Lung bases are clear. Included heart size is normal. Mild coronary artery calcifications. No pericardial effusion. HEPATOBILIARY: 2.5 cm calcified gallstone without CT findings of acute cholecystitis. Calcified hepatic granulomas, liver is otherwise normal. PANCREAS: Normal. SPLEEN: Normal. ADRENALS/URINARY TRACT: Kidneys are orthotopic, demonstrating symmetric enhancement. Punctate bilateral nephrolithiasis versus early contrast excretion. No hydronephrosis or solid renal masses. Two homogeneously hypodense benign-appearing cyst LEFT kidney measure to 3.6 cm, 1 cm RIGHT interpolar cyst associated with scarring. The unopacified ureters are normal in course and caliber. Delayed imaging through the kidneys demonstrates symmetric prompt contrast excretion within the proximal urinary collecting system. Urinary bladder is partially distended, no discrete mass identified today though sensitivity decreased by lack of contrast within the bladder. Normal adrenal glands. STOMACH/BOWEL: Evaluation of bowel pathology decreased without oral contrast. Mild colonic diverticulosis. Single loop  of small bowel mural thickening dilated to 2.5 cm in the pelvis. Small bowel feces proximal to the edematous bowel loop. Scattered small bowel air-fluid levels, small bowel dilated to 2.9 cm. The appendix is not discretely identified, however there are no inflammatory changes in the right lower quadrant. Mild stool distended rectum, unlikely to reflect fecal impaction. VASCULAR/LYMPHATIC: Aortoiliac vessels are normal in  course and caliber. Moderate to severe calcific atherosclerosis. No lymphadenopathy by CT size criteria. REPRODUCTIVE: Status post hysterectomy. OTHER: Minimal perihepatic and intraperitoneal pelvic free fluid. MUSCULOSKELETAL: Nonacute. Small fat containing LEFT inguinal hernia with small amount of fluid. Anterior abdominal wall scarring. Grade 1 L4-5 anterolisthesis without spondylolysis. Mild old T12 compression fracture. IMPRESSION: 1. Single loop of edematous small bowel in pelvis with top-normal distension of proximal small bowel with air-fluid levels concerning for early partial recurrent small bowel obstruction, less likely ileus. 2. Small volume ascites. 3. Cholelithiasis without acute cholecystitis. 4. Aortic Atherosclerosis (ICD10-I70.0). Acute findings discussed with and reconfirmed by Dr.PETER MESSICK on 08/25/2018 at 3:44 pm. Electronically Signed   By: Elon Alas M.D.   On: 08/25/2018 15:46    EKG: not done   Labs on Admission: I have personally reviewed the available labs and imaging studies at the time of the admission.  Pertinent labs:   Glucose 169 AP 194 AST 87/ALT 63/Bili 1.0 Unremarkable CBC UA: 150 glucose; moderate Hgb; 5 ketones; 100 protein; rare bacteria   Assessment/Plan Principal Problem:   SBO (small bowel obstruction) (HCC) Active Problems:   HTN (hypertension)   Endometrioid adenocarcinoma of uterus (HCC)   Type 2 diabetes mellitus (HCC)   SBO -Patient with admission for robot-assisted TAH due to h/o grade 1 endometrial CA;  uncomplicated surgery and d/c 05/25/18 -She returned 12/26-29 with SBO -She presents today with 1-2 days of recurrent symptoms of acute onset of abdominal pain with n/v, abdominal distention, and CT findings c/w SBO -Gen Surg to admit with plan for surgical intervention tomorrow AM given recurrence of symptoms within such a short period of time -NPO for bowel rest -NG tube in place -IVF hydration -Pain control with morphine  Diet-controlled DM -Last A1c was 5.0 -Not taking medications -Will check glucose and cover with moderate-scale SSI  HTN -Reports suboptimal control at home -Significant BP elevation while in the ER -Will continue home meds -Add prn IV hydralazine  Gynecologic malignancy -Patient with admission for robot-assisted TAH due to h/o grade 1 endometrial CA; uncomplicated surgery and d/c 05/25/18 -Of note, she also had been noted to have a bladder mass in 6/19 and 12/19; cystoscopy was unrevealing and she was recommended to have outpatient f/u -There is no such mention on CT today, although there is ascites fluid noted -There is the possibility that the current SBO is related to remaining pelvic malignancy  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 in 12/19 and places patient at increased risk for GB CA development -Defer to surgery service  Deconditioning -Will need PT/OT post-operatively -Previously recommended for SNF and declined, will need possible placement  Irregular heart beat -Patient reportedly told by EMS she had new-onset afib -On exam, she clearly had NSR with occasional missed beats -On telemetry, she clearly had P waves -Low suspicion for afib at this time -However, given plan for surgery in AM, will request STAT echo to ensure no obvious WMA or other issue   DVT prophylaxis:  Lovenox or as per surgery Code Status:  DNR - confirmed with patient/family Family Communication: Daughter present throughout evaluation; she is an Therapist, sports on [employer] and  requests placement of her mother there, if possible  Disposition Plan:  Likely to require placement   Karmen Bongo MD [facility]   How to contact the [facility] Attending or Consulting provider [place] or covering provider during after hours 7P -7A, for this patient?  1. Check the care team in [facility] and look for  a) attending/consulting [place] provider listed and b) the [facility] team listed 2. Log into [email or web address] and use [place]'s universal password to access. If you do not have the password, please contact the hospital operator. 3. Locate the [facility] provider you are looking for under [facility] and page to a number that you can be directly reached. 4. If you still have difficulty reaching the provider, please page the [facility] (Director on Call) for the Hospitalists listed on amion for assistance.   08/25/2018, 4:55 PM

## 2018-08-26 ENCOUNTER — Encounter (HOSPITAL_COMMUNITY): Admission: EM | Disposition: A | Discharge status: Home Health | Payer: Self-pay | Source: Home / Self Care

## 2018-08-26 ENCOUNTER — Inpatient Hospital Stay (HOSPITAL_COMMUNITY): Payer: Medicare HMO | Admitting: Certified Registered"

## 2018-08-26 ENCOUNTER — Encounter (HOSPITAL_COMMUNITY): Payer: Self-pay

## 2018-08-26 ENCOUNTER — Inpatient Hospital Stay (HOSPITAL_COMMUNITY): Payer: Medicare HMO

## 2018-08-26 ENCOUNTER — Other Ambulatory Visit: Payer: Self-pay

## 2018-08-26 DIAGNOSIS — I351 Nonrheumatic aortic (valve) insufficiency: Secondary | ICD-10-CM

## 2018-08-26 HISTORY — PX: LAPAROSCOPY: SHX197

## 2018-08-26 LAB — ECHOCARDIOGRAM COMPLETE
Height: 59 in
Weight: 3008 oz

## 2018-08-26 LAB — CBC
HCT: 36.1 % (ref 36.0–46.0)
Hemoglobin: 11.2 g/dL — ABNORMAL LOW (ref 12.0–15.0)
MCH: 25.1 pg — ABNORMAL LOW (ref 26.0–34.0)
MCHC: 31 g/dL (ref 30.0–36.0)
MCV: 80.9 fL (ref 80.0–100.0)
Platelets: 241 10*3/uL (ref 150–400)
RBC: 4.46 MIL/uL (ref 3.87–5.11)
RDW: 16.6 % — ABNORMAL HIGH (ref 11.5–15.5)
WBC: 7.2 10*3/uL (ref 4.0–10.5)
nRBC: 0 % (ref 0.0–0.2)

## 2018-08-26 LAB — COMPREHENSIVE METABOLIC PANEL
ALT: 47 U/L — ABNORMAL HIGH (ref 0–44)
AST: 52 U/L — ABNORMAL HIGH (ref 15–41)
Albumin: 3 g/dL — ABNORMAL LOW (ref 3.5–5.0)
Alkaline Phosphatase: 157 U/L — ABNORMAL HIGH (ref 38–126)
Anion gap: 12 (ref 5–15)
BUN: 11 mg/dL (ref 8–23)
CO2: 25 mmol/L (ref 22–32)
Calcium: 9 mg/dL (ref 8.9–10.3)
Chloride: 100 mmol/L (ref 98–111)
Creatinine, Ser: 0.73 mg/dL (ref 0.44–1.00)
GFR calc Af Amer: >60 mL/min (ref >60)
GFR calc non Af Amer: >60 mL/min (ref >60)
Glucose, Bld: 96 mg/dL (ref 70–99)
Potassium: 3.2 mmol/L — ABNORMAL LOW (ref 3.5–5.1)
Sodium: 137 mmol/L (ref 135–145)
Total Bilirubin: 1.1 mg/dL (ref 0.3–1.2)
Total Protein: 6.2 g/dL — ABNORMAL LOW (ref 6.5–8.1)

## 2018-08-26 LAB — GLUCOSE, CAPILLARY
Glucose-Capillary: 98 mg/dL (ref 70–99)
Glucose-Capillary: 95 mg/dL (ref 70–99)
Glucose-Capillary: 110 mg/dL — ABNORMAL HIGH (ref 70–99)
Glucose-Capillary: 114 mg/dL — ABNORMAL HIGH (ref 70–99)
Glucose-Capillary: 112 mg/dL — ABNORMAL HIGH (ref 70–99)

## 2018-08-26 SURGERY — LAPAROSCOPY, DIAGNOSTIC
Anesthesia: General | Site: Abdomen

## 2018-08-26 MED ORDER — DEXAMETHASONE SODIUM PHOSPHATE 10 MG/ML IJ SOLN
INTRAMUSCULAR | Status: DC | PRN
  Administered 2018-08-26: 10 mg via INTRAVENOUS

## 2018-08-26 MED ORDER — BUPIVACAINE-EPINEPHRINE 0.25% -1:200000 IJ SOLN
INTRAMUSCULAR | Status: DC | PRN
  Administered 2018-08-26: 10 mL

## 2018-08-26 MED ORDER — FENTANYL CITRATE (PF) 250 MCG/5ML IJ SOLN
INTRAMUSCULAR | Status: AC
  Filled 2018-08-26: qty 5

## 2018-08-26 MED ORDER — PROPOFOL 10 MG/ML IV BOLUS
INTRAVENOUS | Status: AC
  Filled 2018-08-26: qty 20

## 2018-08-26 MED ORDER — FENTANYL CITRATE (PF) 100 MCG/2ML IJ SOLN: 25.0000 ug | INTRAMUSCULAR | Status: DC | PRN

## 2018-08-26 MED ORDER — ACETAMINOPHEN 500 MG PO TABS
1000.0000 mg | ORAL_TABLET | ORAL | Status: AC
  Administered 2018-08-26: 1000 mg via ORAL
  Filled 2018-08-26: qty 2

## 2018-08-26 MED ORDER — LIDOCAINE 2% (20 MG/ML) 5 ML SYRINGE
INTRAMUSCULAR | Status: AC
  Filled 2018-08-26: qty 5

## 2018-08-26 MED ORDER — CIPROFLOXACIN IN D5W 400 MG/200ML IV SOLN
400.0000 mg | INTRAVENOUS | Status: AC
  Administered 2018-08-26: 400 mg via INTRAVENOUS
  Filled 2018-08-26: qty 200

## 2018-08-26 MED ORDER — GABAPENTIN 300 MG PO CAPS
300.0000 mg | ORAL_CAPSULE | ORAL | Status: AC
  Administered 2018-08-26: 300 mg via ORAL
  Filled 2018-08-26: qty 1

## 2018-08-26 MED ORDER — POTASSIUM CHLORIDE 10 MEQ/100ML IV SOLN
10.0000 meq | INTRAVENOUS | Status: AC
  Administered 2018-08-26 (×3): 10 meq via INTRAVENOUS
  Filled 2018-08-26 (×3): qty 100

## 2018-08-26 MED ORDER — LIDOCAINE 2% (20 MG/ML) 5 ML SYRINGE
INTRAMUSCULAR | Status: DC | PRN
  Administered 2018-08-26: 100 mg via INTRAVENOUS

## 2018-08-26 MED ORDER — PROPOFOL 10 MG/ML IV BOLUS
INTRAVENOUS | Status: DC | PRN
  Administered 2018-08-26: 140 mg via INTRAVENOUS

## 2018-08-26 MED ORDER — SUGAMMADEX SODIUM 200 MG/2ML IV SOLN
INTRAVENOUS | Status: DC | PRN
  Administered 2018-08-26: 170 mg via INTRAVENOUS

## 2018-08-26 MED ORDER — HYDRALAZINE HCL 20 MG/ML IJ SOLN
5.0000 mg | INTRAMUSCULAR | Status: DC | PRN
  Administered 2018-08-26 – 2018-08-28 (×3): 5 mg via INTRAVENOUS
  Filled 2018-08-26 (×3): qty 1

## 2018-08-26 MED ORDER — LACTATED RINGERS IV SOLN
INTRAVENOUS | Status: DC
  Administered 2018-08-26: 11:00:00 via INTRAVENOUS

## 2018-08-26 MED ORDER — FENTANYL CITRATE (PF) 100 MCG/2ML IJ SOLN
INTRAMUSCULAR | Status: DC | PRN
  Administered 2018-08-26 (×3): 50 ug via INTRAVENOUS

## 2018-08-26 MED ORDER — SUCCINYLCHOLINE CHLORIDE 20 MG/ML IJ SOLN
INTRAMUSCULAR | Status: DC | PRN
  Administered 2018-08-26: 100 mg via INTRAVENOUS

## 2018-08-26 MED ORDER — ROCURONIUM BROMIDE 50 MG/5ML IV SOSY
PREFILLED_SYRINGE | INTRAVENOUS | Status: DC | PRN
  Administered 2018-08-26: 50 mg via INTRAVENOUS

## 2018-08-26 MED ORDER — ENOXAPARIN SODIUM 40 MG/0.4ML ~~LOC~~ SOLN
40.0000 mg | SUBCUTANEOUS | Status: DC
  Administered 2018-08-27 – 2018-08-30 (×4): 40 mg via SUBCUTANEOUS
  Filled 2018-08-26 (×4): qty 0.4

## 2018-08-26 MED ORDER — 0.9 % SODIUM CHLORIDE (POUR BTL) OPTIME
TOPICAL | Status: DC | PRN
  Administered 2018-08-26: 2000 mL

## 2018-08-26 MED ORDER — MEPERIDINE HCL 50 MG/ML IJ SOLN: 6.2500 mg | INTRAMUSCULAR | Status: DC | PRN

## 2018-08-26 MED ORDER — METOCLOPRAMIDE HCL 5 MG/ML IJ SOLN: 10.0000 mg | Freq: Once | INTRAMUSCULAR | Status: DC | PRN

## 2018-08-26 MED ORDER — ROCURONIUM BROMIDE 50 MG/5ML IV SOSY
PREFILLED_SYRINGE | INTRAVENOUS | Status: AC
  Filled 2018-08-26: qty 5

## 2018-08-26 MED ORDER — ONDANSETRON HCL 4 MG/2ML IJ SOLN
INTRAMUSCULAR | Status: DC | PRN
  Administered 2018-08-26: 4 mg via INTRAVENOUS

## 2018-08-26 MED ORDER — ORAL CARE MOUTH RINSE
15.0000 mL | Freq: Two times a day (BID) | OROMUCOSAL | Status: DC
  Administered 2018-08-26 – 2018-08-30 (×7): 15 mL via OROMUCOSAL

## 2018-08-26 SURGICAL SUPPLY — 34 items
BLADE CLIPPER SURG (BLADE) IMPLANT
CANISTER SUCT 3000ML PPV (MISCELLANEOUS) ×2 IMPLANT
CHLORAPREP W/TINT 26ML (MISCELLANEOUS) ×2 IMPLANT
COVER SURGICAL LIGHT HANDLE (MISCELLANEOUS) ×2 IMPLANT
COVER WAND RF STERILE (DRAPES) IMPLANT
DECANTER SPIKE VIAL GLASS SM (MISCELLANEOUS) ×2 IMPLANT
DERMABOND ADVANCED (GAUZE/BANDAGES/DRESSINGS) ×1
DERMABOND ADVANCED .7 DNX12 (GAUZE/BANDAGES/DRESSINGS) ×1 IMPLANT
DRAPE WARM FLUID 44X44 (DRAPE) ×2 IMPLANT
ELECT REM PT RETURN 9FT ADLT (ELECTROSURGICAL) ×2
ELECTRODE REM PT RTRN 9FT ADLT (ELECTROSURGICAL) ×1 IMPLANT
GLOVE BIO SURGEON STRL SZ8 (GLOVE) ×2 IMPLANT
GLOVE BIOGEL PI IND STRL 8 (GLOVE) ×1 IMPLANT
GLOVE BIOGEL PI INDICATOR 8 (GLOVE) ×1
GOWN STRL REUS W/ TWL LRG LVL3 (GOWN DISPOSABLE) ×2 IMPLANT
GOWN STRL REUS W/ TWL XL LVL3 (GOWN DISPOSABLE) ×1 IMPLANT
GOWN STRL REUS W/TWL LRG LVL3 (GOWN DISPOSABLE) ×2
GOWN STRL REUS W/TWL XL LVL3 (GOWN DISPOSABLE) ×1
KIT BASIN OR (CUSTOM PROCEDURE TRAY) ×2 IMPLANT
KIT TURNOVER KIT B (KITS) ×4 IMPLANT
NS IRRIG 1000ML POUR BTL (IV SOLUTION) ×4 IMPLANT
PAD ARMBOARD 7.5X6 YLW CONV (MISCELLANEOUS) ×2 IMPLANT
SCISSORS LAP 5X35 DISP (ENDOMECHANICALS) IMPLANT
SET IRRIG TUBING LAPAROSCOPIC (IRRIGATION / IRRIGATOR) IMPLANT
SET TUBE SMOKE EVAC HIGH FLOW (TUBING) ×2 IMPLANT
SHEARS HARMONIC ACE PLUS 36CM (ENDOMECHANICALS) IMPLANT
SLEEVE ENDOPATH XCEL 5M (ENDOMECHANICALS) ×4 IMPLANT
SUT MNCRL AB 4-0 PS2 18 (SUTURE) ×2 IMPLANT
TOWEL GREEN STERILE FF (TOWEL DISPOSABLE) ×2 IMPLANT
TOWEL OR 17X26 10 PK STRL BLUE (TOWEL DISPOSABLE) ×2 IMPLANT
TRAY LAPAROSCOPIC MC (CUSTOM PROCEDURE TRAY) ×2 IMPLANT
TROCAR XCEL BLUNT TIP 100MML (ENDOMECHANICALS) IMPLANT
TROCAR XCEL NON-BLD 11X100MML (ENDOMECHANICALS) IMPLANT
TROCAR XCEL NON-BLD 5MMX100MML (ENDOMECHANICALS) ×2 IMPLANT

## 2018-08-26 NOTE — Progress Notes (Signed)
  Echocardiogram 2D Echocardiogram has been performed.  Brenda Alexander 08/26/2018, 8:51 AM

## 2018-08-26 NOTE — Care Management Note (Signed)
Case Management Note  Patient Details  Name: Brenda Alexander MRN: [id number] Date of Birth: 1932/12/18  Subjective/Objective:                    Action/Plan:  Patient from home, active with [facility] for PT/OT. Will need resumption of care orders. Will continue to follow. Patient currently in surgery Expected Discharge Date:  08/30/18               Expected Discharge Plan:     In-House Referral:     Discharge planning Services  CM Consult  Post Acute Care Choice:  Home Health Choice offered to:     DME Arranged:    DME Agency:     HH Arranged:    HH Agency:     Status of Service:  In process, will continue to follow  If discussed at Long Length of Stay Meetings, dates discussed:    Additional Comments:  Marilu Favre, RN 08/26/2018, 10:28 AM

## 2018-08-26 NOTE — Interval H&P Note (Signed)
History and Physical Interval Note:  08/26/2018 11:10 AM  Brenda Alexander  has presented today for surgery, with the diagnosis of recurrent small bowel obstruction  The various methods of treatment have been discussed with the patient and family. After consideration of risks, benefits and other options for treatment, the patient has consented to  Procedure(s): LAPAROSCOPY DIAGNOSTIC, POSSIBLE LAPAROTOMY (N/A) as a surgical intervention .  The patient's history has been reviewed, patient examined, no change in status, stable for surgery.  I have reviewed the patient's chart and labs.  Questions were answered to the patient's satisfaction.     [place]

## 2018-08-26 NOTE — Op Note (Signed)
Preoperative diagnosis: History of recurrent partial small bowel obstructions  Postoperative diagnosis: Same  Procedure: Diagnostic laparoscopy  Surgeon: [name], MD  Anesthesia: General with 0.25% Sensorcaine local with epinephrine  EBL: 10 cc  Specimen: None  IV fluids: Per anesthesia record  Findings: Transition zone of small bowel and distal jejunum proximal ileum.  Area of hemorrhagic bowel with transition point located very with decompressed distal small bowel.  No obvious mass.  Single anterior wall abdominal adhesions were lysed.  Indications for procedure: The patient is an 85 year old female has had 2 partial small bowel obstructions in less than 4 weeks.  She has a history of robotic hysterectomy for endometrial cancer last year.  Since she recurred recommended diagnostic laparoscopy to further evaluate why she is having recurrent obstructions.  Risk, benefits and other options discussed.The procedure has been discussed with the patient.  Alternative therapies have been discussed with the patient.  Operative risks include bleeding,  Infection,  Organ injury,  Nerve injury,  Blood vessel injury,  DVT,  Pulmonary embolism,  Death,  And possible reoperation.  Medical management risks include worsening of present situation.  The success of the procedure is 50 -90 % at treating patients symptoms.  The patient understands and agrees to proceed.   Description of procedure: The patient was met in the holding area and questions were answered.  She was taken back to the operating room and placed supine upon the operating room table.  After induction of general anesthesia, Foley catheter was placed.  The abdomen was then prepped and draped in a sterile fashion.  Timeout was done.  She received preoperative antibiotics.  A 1 cm supraumbilical incision was made.  Dissection was carried down to the fascia was opened in the midline and a pursestring suture of 0 Vicryl was placed.  A 12 mm  Hassan cannula was placed under direct vision.  Pneumoperitoneum was created to 15 mmHg of CO2 pressure.  Laparoscopy was done.  There is a small amount of ascites.  There is mild to moderate small bowel dilation especially in the distal jejunum.  I then placed 3 5 mm ports 1 in the left lower quadrant, the second in the upper midline and the other in the right upper quadrant.  I then identified the colon.  Cecum was identified and the ligament of Johnella Moloney was identified.  The small bowel was run distal to proximal.  It was decompressed through most of the ileum until the distal jejunum.  There is an area of transition at this point but no obvious transition point.  The bowel was hemorrhagic but viable.  There was a mark across the bowel at this point consistent with band adhesion but the only adhesive band was between the omentum and the anterior abdominal wall this was lysed.  I then ran the bowel more proximally and it was mild to moderate moderately dilated.  And this was run all the way to the ligament of Treitz.  I then ran the small bowel from the ligament of Treitz back down to the point of hemorrhagic distal jejunum.  Upon examination I saw no evidence of mass lesion.  This appeared to be extrinsic compression at some point but the area was not easily identified.  The only possible area was again adhesive band between the omentum and the lower anterior abdominal wall and this may have decompressed spontaneously.  The ascending, transverse, descending colon were grossly normal as well as the pelvis.  No signs of any  recurrent disease in the pelvis upon examination.  The liver, gallbladder and stomach are grossly normal as well.  There is no evidence of significant peritoneal studding and the ascites was quite minimal and reactive.  No evidence of cecal inflammation.  At this point time given the negative findings opted to complete the laparoscopy.  Ports removed CO2 was allowed to escape and the fascia was  closed with 0 Vicryl and 4-0 Monocryl for the skin incisions.  Dermabond applied.  All final counts found to be correct.  Patient was awoke extubated taken recovery in satisfactory condition.

## 2018-08-26 NOTE — Anesthesia Procedure Notes (Signed)
Procedure Name: Intubation Date/Time: 08/26/2018 12:00 PM Performed by: Lance Coon, CRNA Pre-anesthesia Checklist: Patient identified, Emergency Drugs available, Suction available, Timeout performed and Patient being monitored Patient Re-evaluated:Patient Re-evaluated prior to induction Oxygen Delivery Method: Circle system utilized Preoxygenation: Pre-oxygenation with 100% oxygen Induction Type: IV induction, Rapid sequence and Cricoid Pressure applied Laryngoscope Size: Miller and 3 Grade View: Grade II Tube type: Oral Tube size: 7.0 mm Number of attempts: 1 Airway Equipment and Method: Stylet Placement Confirmation: ETT inserted through vocal cords under direct vision,  positive ETCO2 and breath sounds checked- equal and bilateral Secured at: 21 cm Tube secured with: Tape Dental Injury: Teeth and Oropharynx as per pre-operative assessment

## 2018-08-26 NOTE — Anesthesia Postprocedure Evaluation (Signed)
Anesthesia Post Note  Patient: Brenda Alexander  Procedure(s) Performed: LAPAROSCOPY DIAGNOSTIC (N/A Abdomen)     Patient location during evaluation: PACU Anesthesia Type: General Level of consciousness: awake and alert Pain management: pain level controlled Vital Signs Assessment: post-procedure vital signs reviewed and stable Respiratory status: spontaneous breathing, nonlabored ventilation, respiratory function stable and patient connected to nasal cannula oxygen Cardiovascular status: blood pressure returned to baseline and stable Postop Assessment: no apparent nausea or vomiting Anesthetic complications: no    Last Vitals:  Vitals:   08/26/18 1333 08/26/18 1411  BP: (!) 174/74 (!) 198/93  Pulse: 79 71  Resp: 19 18  Temp: 36.5 C 36.9 C  SpO2: 98% 96%    Last Pain:  Vitals:   08/26/18 1411  TempSrc: Oral  PainSc:                  Montez Hageman

## 2018-08-26 NOTE — Anesthesia Preprocedure Evaluation (Signed)
Anesthesia Evaluation  Patient identified by MRN, date of birth, ID band Patient awake    Reviewed: Allergy & Precautions, NPO status , Patient's Chart, lab work & pertinent test results, reviewed documented beta blocker date and time   History of Anesthesia Complications Negative for: history of anesthetic complications  Airway Mallampati: II  TM Distance: >3 FB Neck ROM: Full    Dental  (+) Partial Upper, Partial Lower   Pulmonary neg pulmonary ROS,    Pulmonary exam normal        Cardiovascular hypertension, Pt. on medications and Pt. on home beta blockers + CAD  Normal cardiovascular exam  Pt is unsure of how she was diagnosed with CAD - she denies any sxs of CP or DOE. No stress test or LHC to indicate diagnosis of CAD in our records. She has a normal EKG from 12/2017. No hx of PCI or CABG. Activity is limited by joint pain and not dyspnea.    Neuro/Psych negative neurological ROS  negative psych ROS   GI/Hepatic negative GI ROS, Neg liver ROS,   Endo/Other  diabetes  Renal/GU negative Renal ROS  negative genitourinary   Musculoskeletal  (+) Arthritis ,   Abdominal   Peds  Hematology  (+) anemia ,   Anesthesia Other Findings   Reproductive/Obstetrics                             Anesthesia Physical  Anesthesia Plan  ASA: III  Anesthesia Plan: General   Post-op Pain Management:    Induction: Rapid sequence, Cricoid pressure planned and Intravenous  PONV Risk Score and Plan: 4 or greater and Ondansetron, Dexamethasone and Treatment may vary due to age or medical condition  Airway Management Planned: Oral ETT  Additional Equipment: None  Intra-op Plan:   Post-operative Plan: Extubation in OR  Informed Consent: I have reviewed the patients History and Physical, chart, labs and discussed the procedure including the risks, benefits and alternatives for the proposed anesthesia  with the patient or authorized representative who has indicated his/her understanding and acceptance.       Plan Discussed with: CRNA, Anesthesiologist and Surgeon  Anesthesia Plan Comments:         Anesthesia Quick Evaluation

## 2018-08-26 NOTE — Transfer of Care (Signed)
Immediate Anesthesia Transfer of Care Note  Patient: Brenda Alexander  Procedure(s) Performed: LAPAROSCOPY DIAGNOSTIC (N/A Abdomen)  Patient Location: PACU  Anesthesia Type:General  Level of Consciousness: awake and patient cooperative  Airway & Oxygen Therapy: Patient Spontanous Breathing  Post-op Assessment: Report given to RN and Post -op Vital signs reviewed and stable  Post vital signs: Reviewed and stable  Last Vitals:  Vitals Value Taken Time  BP 173/76 08/26/2018  1:19 PM  Temp 36.5 C 08/26/2018  1:18 PM  Pulse 79 08/26/2018  1:19 PM  Resp 18 08/26/2018  1:19 PM  SpO2 97 % 08/26/2018  1:19 PM  Vitals shown include unvalidated device data.  Last Pain:  Vitals:   08/26/18 1318  TempSrc:   PainSc: 0-No pain         Complications: No apparent anesthesia complications

## 2018-08-26 NOTE — Progress Notes (Signed)
Consult NOTE    Brenda Alexander  DXI:[id number] DOB: 18-Jun-1933 DOA: 08/25/2018 PCP: Nolene Ebbs, MD   Brief Narrative: Per HPI Brenda Alexander is Brenda Alexander 85 y.o. female with medical history significant of HTN; HLD; DM; and CAD presenting with abdominal pain.  She developed abdominal pain and n/v with anything she ate.  Symptoms started yesterday or maybe Monday.  These were the same symptoms as during last hospitalization.  She had Ruthella Kirchman TAH in October - she had been SOB, was found to be anemic, transfused, was losing blood vaginally.  Uterine biopsy with cancer.  Referred to Dr. Denman George and had Velecia Ovitt complete hysterectomy on 10/21.  She reports doing great but her daughter reports decreased PO, Duan Scharnhorst little weaker, slow to progress.  She developed an SBO on 12/26, she had an NG tube placed, and she got better and was hospitalized for 3 days without need for intervention.  She remained weak but otherwise has done well.  She did slide out of bed Shyloh Krinke couple of times.  She was suggested to have SNF last time and she declined and family agreed, but now they understand that this may be needed.  Her BP has been running "high" since December.  She does not miss medication doses.  She does not have h/o irregular heart beat  Assessment & Plan:   Principal Problem:   SBO (small bowel obstruction) (HCC) Active Problems:   HTN (hypertension)   Endometrioid adenocarcinoma of uterus (HCC)   Type 2 diabetes mellitus ([place])   SBO -Patient with admission for robot-assisted TAH due to h/o grade 1 endometrial CA; uncomplicated surgery and d/c 05/25/18 -She returned 12/26-29 with SBO -She presents today with 1-2 days of recurrent symptoms of acute onset of abdominal pain with n/v, abdominal distention, and CT findings c/w SBO -Gen Surg to admit with plan for surgical intervention 1/30 AM (today) given recurrence of symptoms within such [place] short period of time -NPO for bowel rest -NG tube in place -IVF hydration -Pain control with  morphine -RCRI 1-2 (intraperitoneal, pt denies hx of MI or CVA, though chart hx CAD, hx of DM, but recent A1c of 5 in 04/2018, likely no periop insulin).  Discussed risks/benefits.  Echo pending as noted below, will f/u results of echo, but if no concerning findings, likely no further w/u necessary.  Diet-controlled DM -Last A1c was 5.0 -Not taking medications -Will check glucose and cover with moderate-scale SSI - stable  HTN -Elevated yesterday to ~767 systolic, improved today, continue to monitor -Will continue home meds, but hold ace I today -Add prn IV hydralazine  Gynecologic malignancy -Patient with admission for robot-assisted TAH due to h/o grade 1 endometrial CA; uncomplicated surgery and d/c 05/25/18 -Of note, she also had been noted to have Myer Bohlman bladder mass in 6/19 and 12/19; cystoscopy was unrevealing (per previous notes) and she was recommended to have outpatient f/u -There is no such mention on CT today (will plan to discuss with radiology), although there is ascites fluid noted -Discussed need for outpatient follow up with urology given above  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 in 12/19 and places patient at increased risk for GB CA development -Defer to surgery service  Deconditioning -Will need PT/OT post-operatively -Previously recommended for SNF and declined, will need possible placement  Irregular heart beat -Patient reportedly told by EMS she had new-onset afib -Will place on telemetry for 24 hours -Exam with regular rate -EKG with sinus rhythm -low suspicion for afib, monitor on telemetry  Elevated liver enzymes Follow, CT without concerning findings Follow hepatitis panel  DVT prophylaxis: lovenox, will hold today Code Status: DNR Family Communication: daughter at bedside Disposition Plan: per surgery   Consultants:   Surgery is primary  Procedures:  Echo pending  Antimicrobials:  Anti-infectives (From admission, onward)     Start     Dose/Rate Route Frequency Ordered Stop   08/26/18 0600  ciprofloxacin (CIPRO) IVPB 400 mg     400 mg 200 mL/hr over 60 Minutes Intravenous On call to O.R. 08/26/18 0107 08/27/18 0559     Subjective: Nausea, vomiting, abdominal pain on presentatino Improved after NG placement + BM, no flatus  Objective: Vitals:   08/25/18 2109 08/25/18 2252 08/26/18 0444 08/26/18 0446  BP: (!) 179/86 (!) 159/83 (!) 149/72 (!) 149/72  Pulse: (!) 109 96 72 73  Resp: 16  18 18   Temp: 99.2 F (37.3 C)  98.6 F (37 C) 98.6 F (37 C)  TempSrc: Oral  Oral Oral  SpO2: 100%   100%  Weight:      Height:        Intake/Output Summary (Last 24 hours) at 08/26/2018 0815 Last data filed at 08/26/2018 0502 Gross per 24 hour  Intake 869.15 ml  Output 230 ml  Net 639.15 ml   Filed Weights   08/25/18 1101  Weight: 85.3 kg    Examination:  General exam: Appears calm and comfortable  Respiratory system: Clear to auscultation. Respiratory effort normal. Cardiovascular system: S1 & S2 heard, RRR. Gastrointestinal system: Abdomen is nondistended, soft and nontender. + bowel sounds.  NG in place.. Central nervous system: Alert and oriented. No focal neurological deficits. Extremities: Symmetric 5 x 5 power. Skin: No rashes, lesions or ulcers Psychiatry: Judgement and insight appear normal. Mood & affect appropriate.     Data Reviewed: I have personally reviewed following labs and imaging studies  CBC: Recent Labs  Lab 08/25/18 1102 08/26/18 0325  WBC 8.9 7.2  HGB 13.0 11.2*  HCT 40.6 36.1  MCV 81.2 80.9  PLT 254 786   Basic Metabolic Panel: Recent Labs  Lab 08/25/18 1102  NA 135  K 3.6  CL 97*  CO2 25  GLUCOSE 169*  BUN 15  CREATININE 0.72  CALCIUM 9.7   GFR: Estimated Creatinine Clearance: 50.5 mL/min (by C-G formula based on SCr of 0.72 mg/dL). Liver Function Tests: Recent Labs  Lab 08/25/18 1102  AST 87*  ALT 63*  ALKPHOS 194*  BILITOT 1.0  PROT 7.2   ALBUMIN 3.5   Recent Labs  Lab 08/25/18 1102  LIPASE 19   No results for input(s): AMMONIA in the last 168 hours. Coagulation Profile: No results for input(s): INR, PROTIME in the last 168 hours. Cardiac Enzymes: No results for input(s): CKTOTAL, CKMB, CKMBINDEX, TROPONINI in the last 168 hours. BNP (last 3 results) No results for input(s): PROBNP in the last 8760 hours. HbA1C: No results for input(s): HGBA1C in the last 72 hours. CBG: Recent Labs  Lab 08/25/18 1810 08/25/18 2133 08/26/18 0436  GLUCAP 130* 123* 98   Lipid Profile: No results for input(s): CHOL, HDL, LDLCALC, TRIG, CHOLHDL, LDLDIRECT in the last 72 hours. Thyroid Function Tests: No results for input(s): TSH, T4TOTAL, FREET4, T3FREE, THYROIDAB in the last 72 hours. Anemia Panel: No results for input(s): VITAMINB12, FOLATE, FERRITIN, TIBC, IRON, RETICCTPCT in the last 72 hours. Sepsis Labs: No results for input(s): PROCALCITON, LATICACIDVEN in the last 168 hours.  Recent Results (from the past 240 hour(s))  Surgical  pcr screen     Status: None   Collection Time: 08/25/18  7:04 PM  Result Value Ref Range Status   MRSA, PCR NEGATIVE NEGATIVE Final   Staphylococcus aureus NEGATIVE NEGATIVE Final    Comment: (NOTE) The Xpert SA Assay (FDA approved for NASAL specimens in patients 6 years of age and older), is one component of Ashleah Valtierra comprehensive surveillance program. It is not intended to diagnose infection nor to guide or monitor treatment. Performed at [facility], [place] [address] [place], [place] [address]          Radiology Studies: Ct Abdomen Pelvis W Contrast  Result Date: 08/25/2018 CLINICAL DATA:  Abdominal pain beginning at 6 Suheyla Mortellaro.m. Recent hospitalization for bowel obstruction. History of endometrial cancer, status post hysterectomy. EXAM: CT ABDOMEN AND PELVIS WITH CONTRAST TECHNIQUE: Multidetector CT imaging of the abdomen and pelvis was performed using the standard protocol following  bolus administration of intravenous contrast. CONTRAST:  146mL OMNIPAQUE IOHEXOL 300 MG/ML  SOLN COMPARISON:  CT abdomen and pelvis July 22, 2018 FINDINGS: LOWER CHEST: Lung bases are clear. Included heart size is normal. Mild coronary artery calcifications. No pericardial effusion. HEPATOBILIARY: 2.5 cm calcified gallstone without CT findings of acute cholecystitis. Calcified hepatic granulomas, liver is otherwise normal. PANCREAS: Normal. SPLEEN: Normal. ADRENALS/URINARY TRACT: Kidneys are orthotopic, demonstrating symmetric enhancement. Punctate bilateral nephrolithiasis versus early contrast excretion. No hydronephrosis or solid renal masses. Two homogeneously hypodense benign-appearing cyst LEFT kidney measure to 3.6 cm, 1 cm RIGHT interpolar cyst associated with scarring. The unopacified ureters are normal in course and caliber. Delayed imaging through the kidneys demonstrates symmetric prompt contrast excretion within the proximal urinary collecting system. Urinary bladder is partially distended, no discrete mass identified today though sensitivity decreased by lack of contrast within the bladder. Normal adrenal glands. STOMACH/BOWEL: Evaluation of bowel pathology decreased without oral contrast. Mild colonic diverticulosis. Single loop of small bowel mural thickening dilated to 2.5 cm in the pelvis. Small bowel feces proximal to the edematous bowel loop. Scattered small bowel air-fluid levels, small bowel dilated to 2.9 cm. The appendix is not discretely identified, however there are no inflammatory changes in the right lower quadrant. Mild stool distended rectum, unlikely to reflect fecal impaction. VASCULAR/LYMPHATIC: Aortoiliac vessels are normal in course and caliber. Moderate to severe calcific atherosclerosis. No lymphadenopathy by CT size criteria. REPRODUCTIVE: Status post hysterectomy. OTHER: Minimal perihepatic and intraperitoneal pelvic free fluid. MUSCULOSKELETAL: Nonacute. Small fat  containing LEFT inguinal hernia with small amount of fluid. Anterior abdominal wall scarring. Grade 1 L4-5 anterolisthesis without spondylolysis. Mild old T12 compression fracture. IMPRESSION: 1. Single loop of edematous small bowel in pelvis with top-normal distension of proximal small bowel with air-fluid levels concerning for early partial recurrent small bowel obstruction, less likely ileus. 2. Small volume ascites. 3. Cholelithiasis without acute cholecystitis. 4. Aortic Atherosclerosis (ICD10-I70.0). Acute findings discussed with and reconfirmed by Dr.PETER MESSICK on 08/25/2018 at 3:44 pm. Electronically Signed   By: Elon Alas M.D.   On: 08/25/2018 15:46   Dg Abd Portable 1v  Result Date: 08/25/2018 CLINICAL DATA:  Small bowel obstruction. EXAM: PORTABLE ABDOMEN - 1 VIEW COMPARISON:  CT abdomen pelvis from today. FINDINGS: Enteric tube in place with the tip in the stomach. Proximal side port in the distal esophagus. Mildly dilated small bowel loops seen on CT are less apparent on x-ray. Excreted contrast in the renal collecting systems and ureters. IMPRESSION: 1. Enteric tube tip in the stomach with proximal side port in the distal esophagus. Recommend  advancement 6-7 cm. Electronically Signed   By: Titus Dubin M.D.   On: 08/25/2018 17:28        Scheduled Meds: . acetaminophen  1,000 mg Oral On Call to OR  . amLODipine  5 mg Oral Daily  . cloNIDine  0.2 mg Oral BID  . cycloSPORINE  1 drop Both Eyes BID  . enalapril  20 mg Oral BID  . enoxaparin (LOVENOX) injection  40 mg Subcutaneous Q24H  . gabapentin  300 mg Oral On Call to OR  . insulin aspart  0-15 Units Subcutaneous TID WC  . insulin aspart  0-5 Units Subcutaneous QHS  . isosorbide mononitrate  20 mg Oral Daily  . mouth rinse  15 mL Mouth Rinse BID  . metoprolol succinate  50 mg Oral Daily  . simvastatin  20 mg Oral QHS   Continuous Infusions: . sodium chloride 75 mL/hr at 08/26/18 0447  . ciprofloxacin        LOS: 1 day    Time spent: over 30 min    Fayrene Helper, MD [facility] Pager [email or web address]  If 7PM-7AM, please contact night-coverage [email or web address] Password [email or web address] 08/26/2018, 8:15 AM

## 2018-08-27 ENCOUNTER — Encounter (HOSPITAL_COMMUNITY): Payer: Self-pay | Admitting: Surgery

## 2018-08-27 LAB — COMPREHENSIVE METABOLIC PANEL
ALT: 38 U/L (ref 0–44)
AST: 45 U/L — ABNORMAL HIGH (ref 15–41)
Albumin: 2.6 g/dL — ABNORMAL LOW (ref 3.5–5.0)
Alkaline Phosphatase: 144 U/L — ABNORMAL HIGH (ref 38–126)
Anion gap: 10 (ref 5–15)
BUN: 11 mg/dL (ref 8–23)
CO2: 26 mmol/L (ref 22–32)
Calcium: 8.7 mg/dL — ABNORMAL LOW (ref 8.9–10.3)
Chloride: 104 mmol/L (ref 98–111)
Creatinine, Ser: 0.74 mg/dL (ref 0.44–1.00)
GFR calc Af Amer: >60 mL/min (ref >60)
GFR calc non Af Amer: >60 mL/min (ref >60)
Glucose, Bld: 90 mg/dL (ref 70–99)
Potassium: 3.7 mmol/L (ref 3.5–5.1)
Sodium: 140 mmol/L (ref 135–145)
Total Bilirubin: 1 mg/dL (ref 0.3–1.2)
Total Protein: 5.5 g/dL — ABNORMAL LOW (ref 6.5–8.1)

## 2018-08-27 LAB — HEPATITIS PANEL, ACUTE
HCV Ab: <0.1 s/co ratio (ref 0.0–0.9)
Hep A IgM: NEGATIVE
Hep B C IgM: NEGATIVE
Hepatitis B Surface Ag: NEGATIVE

## 2018-08-27 LAB — CBC
HCT: 35.7 % — ABNORMAL LOW (ref 36.0–46.0)
Hemoglobin: 11 g/dL — ABNORMAL LOW (ref 12.0–15.0)
MCH: 25.1 pg — ABNORMAL LOW (ref 26.0–34.0)
MCHC: 30.8 g/dL (ref 30.0–36.0)
MCV: 81.3 fL (ref 80.0–100.0)
Platelets: 243 10*3/uL (ref 150–400)
RBC: 4.39 MIL/uL (ref 3.87–5.11)
RDW: 16.4 % — ABNORMAL HIGH (ref 11.5–15.5)
WBC: 7.9 10*3/uL (ref 4.0–10.5)
nRBC: 0 % (ref 0.0–0.2)

## 2018-08-27 LAB — GLUCOSE, CAPILLARY
Glucose-Capillary: 83 mg/dL (ref 70–99)
Glucose-Capillary: 75 mg/dL (ref 70–99)
Glucose-Capillary: 86 mg/dL (ref 70–99)
Glucose-Capillary: 76 mg/dL (ref 70–99)
Glucose-Capillary: 89 mg/dL (ref 70–99)

## 2018-08-27 LAB — MAGNESIUM: Magnesium: 1.7 mg/dL (ref 1.7–2.4)

## 2018-08-27 MED ORDER — ENALAPRIL MALEATE 20 MG PO TABS
20.0000 mg | ORAL_TABLET | Freq: Two times a day (BID) | ORAL | Status: DC
  Administered 2018-08-27 – 2018-08-30 (×7): 20 mg via ORAL
  Filled 2018-08-27 (×8): qty 1

## 2018-08-27 MED ORDER — INSULIN ASPART 100 UNIT/ML ~~LOC~~ SOLN: 0.0000 [IU] | Freq: Every day | SUBCUTANEOUS | Status: DC

## 2018-08-27 MED ORDER — DEXTROSE IN LACTATED RINGERS 5 % IV SOLN
INTRAVENOUS | Status: AC
  Administered 2018-08-27: 18:00:00 via INTRAVENOUS

## 2018-08-27 MED ORDER — INSULIN ASPART 100 UNIT/ML ~~LOC~~ SOLN: 0.0000 [IU] | Freq: Three times a day (TID) | SUBCUTANEOUS | Status: DC

## 2018-08-27 MED ORDER — INSULIN ASPART 100 UNIT/ML ~~LOC~~ SOLN: 0.0000 [IU] | SUBCUTANEOUS | Status: DC

## 2018-08-27 MED ORDER — PHENOL 1.4 % MT LIQD
1.0000 | OROMUCOSAL | Status: DC | PRN
  Administered 2018-08-27: 1 via OROMUCOSAL
  Filled 2018-08-27: qty 177

## 2018-08-27 MED ORDER — INSULIN ASPART 100 UNIT/ML ~~LOC~~ SOLN
0.0000 [IU] | Freq: Three times a day (TID) | SUBCUTANEOUS | Status: DC
  Administered 2018-08-29: 1 [IU] via SUBCUTANEOUS

## 2018-08-27 NOTE — Care Management Important Message (Signed)
Important Message  Patient Details  Name: Brenda Alexander MRN: [id number] Date of Birth: Dec 16, 1932   Medicare Important Message Given:  Yes    Babita Amaker Mont[address] 08/27/2018, 3:25 PM

## 2018-08-27 NOTE — Progress Notes (Signed)
Patient complain of increasing discomfort on her throat. When checked, NG tube is almost out. Dr. Grandville Silos made aware and okay to DC Ng tube.

## 2018-08-27 NOTE — Plan of Care (Signed)
  Problem: Clinical Measurements: Goal: Ability to maintain clinical measurements within normal limits will improve Outcome: Progressing   Problem: Activity: Goal: Risk for activity intolerance will decrease Outcome: Progressing   Problem: Pain Managment: Goal: General experience of comfort will improve Outcome: Progressing   Problem: Surgery Discharge Goal: Surgical site without signs of infection Outcome: Progressing

## 2018-08-27 NOTE — Discharge Instructions (Signed)
° ° °Managing Your Pain After Surgery Without Opioids ° ° ° °Thank you for participating in our program to help patients manage their pain after surgery without opioids. This is part of our effort to provide you with the best care possible, without exposing you or your family to the risk that opioids pose. ° °What pain can I expect after surgery? °You can expect to have some pain after surgery. This is normal. The pain is typically worse the day after surgery, and quickly begins to get better. °Many studies have found that many patients are able to manage their pain after surgery with Over-the-Counter (OTC) medications such as Tylenol and Motrin. If you have a condition that does not allow you to take Tylenol or Motrin, notify your surgical team. ° °How will I manage my pain? °The best strategy for controlling your pain after surgery is around the clock pain control with Tylenol (acetaminophen) and Motrin (ibuprofen or Advil). Alternating these medications with each other allows you to maximize your pain control. In addition to Tylenol and Motrin, you can use heating pads or ice packs on your incisions to help reduce your pain. ° °How will I alternate your regular strength over-the-counter pain medication? °You will take a dose of pain medication every three hours. °; Start by taking 650 mg of Tylenol (2 pills of 325 mg) °; 3 hours later take 600 mg of Motrin (3 pills of 200 mg) °; 3 hours after taking the Motrin take 650 mg of Tylenol °; 3 hours after that take 600 mg of Motrin. ° ° °- 1 - ° °See example - if your first dose of Tylenol is at 12:00 PM ° ° °12:00 PM Tylenol 650 mg (2 pills of 325 mg)  °3:00 PM Motrin 600 mg (3 pills of 200 mg)  °6:00 PM Tylenol 650 mg (2 pills of 325 mg)  °9:00 PM Motrin 600 mg (3 pills of 200 mg)  °Continue alternating every 3 hours  ° °We recommend that you follow this schedule around-the-clock for at least 3 days after surgery, or until you feel that it is no longer needed. Use  the table on the last page of this handout to keep track of the medications you are taking. °Important: °Do not take more than 3000mg of Tylenol or 3200mg of Motrin in a 24-hour period. °Do not take ibuprofen/Motrin if you have a history of bleeding stomach ulcers, severe kidney disease, &/or actively taking a blood thinner ° °What if I still have pain? °If you have pain that is not controlled with the over-the-counter pain medications (Tylenol and Motrin or Advil) you might have what we call “breakthrough” pain. You will receive a prescription for a small amount of an opioid pain medication such as Oxycodone, Tramadol, or Tylenol with Codeine. Use these opioid pills in the first 24 hours after surgery if you have breakthrough pain. Do not take more than 1 pill every 4-6 hours. ° °If you still have uncontrolled pain after using all opioid pills, don't hesitate to call our staff using the number provided. We will help make sure you are managing your pain in the best way possible, and if necessary, we can provide a prescription for additional pain medication. ° ° °Day 1   ° °Time  °Name of Medication Number of pills taken  °Amount of Acetaminophen  °Pain Level  ° °Comments  °AM PM       °AM PM       °AM PM       °  AM PM       °AM PM       °AM PM       °AM PM       °AM PM       °Total Daily amount of Acetaminophen °Do not take more than  3,000 mg per day    ° ° °Day 2   ° °Time  °Name of Medication Number of pills °taken  °Amount of Acetaminophen  °Pain Level  ° °Comments  °AM PM       °AM PM       °AM PM       °AM PM       °AM PM       °AM PM       °AM PM       °AM PM       °Total Daily amount of Acetaminophen °Do not take more than  3,000 mg per day    ° ° °Day 3   ° °Time  °Name of Medication Number of pills taken  °Amount of Acetaminophen  °Pain Level  ° °Comments  °AM PM       °AM PM       °AM PM       °AM PM       ° ° ° °AM PM       °AM PM       °AM PM       °AM PM       °Total Daily amount of Acetaminophen °Do  not take more than  3,000 mg per day    ° ° °Day 4   ° °Time  °Name of Medication Number of pills taken  °Amount of Acetaminophen  °Pain Level  ° °Comments  °AM PM       °AM PM       °AM PM       °AM PM       °AM PM       °AM PM       °AM PM       °AM PM       °Total Daily amount of Acetaminophen °Do not take more than  3,000 mg per day    ° ° °Day 5   ° °Time  °Name of Medication Number °of pills taken  °Amount of Acetaminophen  °Pain Level  ° °Comments  °AM PM       °AM PM       °AM PM       °AM PM       °AM PM       °AM PM       °AM PM       °AM PM       °Total Daily amount of Acetaminophen °Do not take more than  3,000 mg per day    ° ° ° °Day 6   ° °Time  °Name of Medication Number of pills °taken  °Amount of Acetaminophen  °Pain Level  °Comments  °AM PM       °AM PM       °AM PM       °AM PM       °AM PM       °AM PM       °AM PM       °AM PM       °Total Daily amount of Acetaminophen °Do not take more than    3,000 mg per day    ° ° °Day 7   ° °Time  °Name of Medication Number of pills taken  °Amount of Acetaminophen  °Pain Level  ° °Comments  °AM PM       °AM PM       °AM PM       °AM PM       °AM PM       °AM PM       °AM PM       °AM PM       °Total Daily amount of Acetaminophen °Do not take more than  3,000 mg per day    ° ° ° ° °For additional information about how and where to safely dispose of unused opioid °medications - https://www.morepowerfulnc.org ° °Disclaimer: This document contains information and/or instructional materials adapted from [facility] for the typical patient with your condition. It does not replace medical advice from your health care provider because your experience may differ from that of the °typical patient. Talk to your health care provider if you have any questions about this °document, your condition or your treatment plan. °Adapted from [facility] ° °[facility] °LAPAROSCOPIC SURGERY: POST OP INSTRUCTIONS °Always review your discharge  instruction sheet given to you by the facility where your surgery was performed. °IF YOU HAVE DISABILITY OR FAMILY LEAVE FORMS, YOU MUST BRING THEM TO THE OFFICE FOR PROCESSING.   °DO NOT GIVE THEM TO YOUR DOCTOR. ° °PAIN CONTROL ° °1. First take acetaminophen (Tylenol) AND/or ibuprofen (Advil) to control your pain after surgery.  Follow directions on package.  Taking acetaminophen (Tylenol) and/or ibuprofen (Advil) regularly after surgery will help to control your pain and lower the amount of prescription pain medication you may need.  You should not take more than 3,000 mg (3 grams) of acetaminophen (Tylenol) in 24 hours.  You should not take ibuprofen (Advil), aleve, motrin, naprosyn or other NSAIDS if you have a history of stomach ulcers or chronic kidney disease.  °2. A prescription for pain medication may be given to you upon discharge.  Take your pain medication as prescribed, if you still have uncontrolled pain after taking acetaminophen (Tylenol) or ibuprofen (Advil). °3. Use ice packs to help control pain. °4. If you need a refill on your pain medication, please contact your pharmacy.  They will contact our office to request authorization. Prescriptions will not be filled after 5pm or on week-ends. ° °HOME MEDICATIONS °5. Take your usually prescribed medications unless otherwise directed. ° °DIET °6. You should follow a light diet the first few days after arrival home.  Be sure to include lots of fluids daily. Avoid fatty, fried foods.  ° °CONSTIPATION °7. It is common to experience some constipation after surgery and if you are taking pain medication.  Increasing fluid intake and taking a stool softener (such as Colace) will usually help or prevent this problem from occurring.  A mild laxative (Milk of Magnesia or Miralax) should be taken according to package instructions if there are no bowel movements after 48 hours. ° °WOUND/INCISION CARE °8. Most patients will experience some swelling and bruising in  the area of the incisions.  Ice packs will help.  Swelling and bruising can take several days to resolve.  °9. Unless discharge instructions indicate otherwise, follow guidelines below  °a. STERI-STRIPS - you may remove your outer bandages 48 hours after surgery, and you may shower at that time.  You have steri-strips (  small skin tapes) in place directly over the incision.  These strips should be left on the skin for 7-10 days.   °b. DERMABOND/SKIN GLUE - you may shower in 24 hours.  The glue will flake off over the next 2-3 weeks. °10. Any sutures or staples will be removed at the office during your follow-up visit. ° °ACTIVITIES °11. You may resume regular (light) daily activities beginning the next day--such as daily self-care, walking, climbing stairs--gradually increasing activities as tolerated.  You may have sexual intercourse when it is comfortable.  Refrain from any heavy lifting or straining until approved by your doctor. °a. You may drive when you are no longer taking prescription pain medication, you can comfortably wear a seatbelt, and you can safely maneuver your car and apply brakes. ° °FOLLOW-UP °12. You should see your doctor in the office for a follow-up appointment approximately 2-3 weeks after your surgery.  You should have been given your post-op/follow-up appointment when your surgery was scheduled.  If you did not receive a post-op/follow-up appointment, make sure that you call for this appointment within a day or two after you arrive home to insure a convenient appointment time. ° ° °WHEN TO CALL YOUR DOCTOR: °1. Fever over 101.0 °2. Inability to urinate °3. Continued bleeding from incision. °4. Increased pain, redness, or drainage from the incision. °5. Increasing abdominal pain ° °The clinic staff is available to answer your questions during regular business hours.  Please don’t hesitate to call and ask to speak to one of the nurses for clinical concerns.  If you have a medical emergency,  go to the nearest emergency room or call 911.  A surgeon from [facility] is always on call at the hospital. °[address], [address], [place], [place]  [address] ? [address] [address], [place], [place]   [address] °[phone] ? [phone] ? FAX [phone] °Web site: [email or web address] ° °

## 2018-08-27 NOTE — Evaluation (Signed)
Occupational Therapy Evaluation Patient Details Name: Brenda Alexander MRN: [id number] DOB: Oct 30, 1932 Today's Date: 08/27/2018    History of Present Illness 85 yo admitted with SBO s/p laparoscopy 1/30. PMhx: uterine CA s/p hysterectomy 10/21, HTN, HLD, DM, CAD   Clinical Impression   Pt with decline in function and safety with ADLs and ADL mobility with decreased strength, balance and endurance. Pt would benefit from acute OT services to address impairments to maximize level of function and safety    Follow Up Recommendations  Home health OT    Equipment Recommendations  None recommended by OT    Recommendations for Other Services       Precautions / Restrictions Precautions Precautions: Fall Precaution Comments: NG tube Restrictions Weight Bearing Restrictions: No      Mobility Bed Mobility Overal bed mobility: Modified Independent             General bed mobility comments: pt OOB in recliner upon arrival  Transfers Overall transfer level: Needs assistance Equipment used: Rolling walker (2 wheeled) Transfers: Sit to/from Stand Sit to Stand: Min guard         General transfer comment: cues for hand placement with increased time to rise    Balance Overall balance assessment: Needs assistance;History of Falls   Sitting balance-Leahy Scale: Fair     Standing balance support: Bilateral upper extremity supported;Single extremity supported;During functional activity Standing balance-Leahy Scale: Poor                             ADL either performed or assessed with clinical judgement   ADL Overall ADL's : Needs assistance/impaired     Grooming: Wash/dry hands;Wash/dry face;Brushing hair;Standing;Min guard   Upper Body Bathing: Set up;Supervision/ safety;Sitting   Lower Body Bathing: Moderate assistance   Upper Body Dressing : Set up;Supervision/safety;Sitting   Lower Body Dressing: Moderate assistance   Toilet Transfer: Min  guard;Ambulation;RW;BSC;Cueing for safety   Toileting- Clothing Manipulation and Hygiene: Min guard;Sit to/from stand       Functional mobility during ADLs: Min guard;Cueing for safety;Rolling walker       Vision Baseline Vision/History: Wears glasses Wears Glasses: At all times Patient Visual Report: No change from baseline       Perception     Praxis      Pertinent Vitals/Pain Pain Assessment: No/denies pain     Hand Dominance Right   Extremity/Trunk Assessment Upper Extremity Assessment Upper Extremity Assessment: Generalized weakness   Lower Extremity Assessment Lower Extremity Assessment: Defer to PT evaluation   Cervical / Trunk Assessment Cervical / Trunk Assessment: Kyphotic   Communication Communication Communication: No difficulties   Cognition Arousal/Alertness: Awake/alert Behavior During Therapy: WFL for tasks assessed/performed Overall Cognitive Status: Within Functional Limits for tasks assessed                                     General Comments       Exercises     Shoulder Instructions      Home Living Family/patient expects to be discharged to:: Private residence Living Arrangements: Alone Available Help at Discharge: Family;Available PRN/intermittently Type of Home: Apartment Home Access: Level entry     Home Layout: One level     Bathroom Shower/Tub: Teacher, early years/pre: Standard     Home Equipment: Environmental consultant - 2 wheels;Cane - single point;Hand held shower head;Bedside commode;Shower seat  Prior Functioning/Environment Level of Independence: Needs assistance  Gait / Transfers Assistance Needed: Pt walking with walker at all times ADL's / Homemaking Assistance Needed: pt and family shared housework and cooking, family drives   Comments: 2 falls recently        OT Problem List: Decreased strength;Decreased activity tolerance;Decreased knowledge of use of DME or AE;Impaired balance  (sitting and/or standing)      OT Treatment/Interventions: Self-care/ADL training;DME and/or AE instruction;Therapeutic activities;Patient/family education    OT Goals(Current goals can be found in the care plan section) Acute Rehab OT Goals Patient Stated Goal: go home OT Goal Formulation: With patient Time For Goal Achievement: 09/10/18 Potential to Achieve Goals: Good ADL Goals Pt Will Perform Grooming: with supervision;with set-up;standing Pt Will Perform Upper Body Bathing: with set-up;with modified independence Pt Will Perform Lower Body Bathing: with min assist Pt Will Perform Upper Body Dressing: with set-up;with modified independence Pt Will Perform Lower Body Dressing: with min assist Pt Will Transfer to Toilet: with supervision;with modified independence;ambulating Pt Will Perform Toileting - Clothing Manipulation and hygiene: with supervision;sit to/from stand Pt Will Perform Tub/Shower Transfer: with min guard assist;with supervision;ambulating;rolling walker;shower seat;3 in 1  OT Frequency: Min 2X/week   Barriers to D/C:    no barriers       Co-evaluation              AM-PAC OT "6 Clicks" Daily Activity     Outcome Measure Help from another person eating meals?: None Help from another person taking care of personal grooming?: A Little Help from another person toileting, which includes using toliet, bedpan, or urinal?: A Little Help from another person bathing (including washing, rinsing, drying)?: A Lot Help from another person to put on and taking off regular upper body clothing?: A Little Help from another person to put on and taking off regular lower body clothing?: A Lot 6 Click Score: 17   End of Session Equipment Utilized During Treatment: Gait belt;Rolling walker  Activity Tolerance: Patient tolerated treatment well Patient left: in chair;with call bell/phone within reach;with chair alarm set  OT Visit Diagnosis: Other abnormalities of gait and  mobility (R26.89);Muscle weakness (generalized) (M62.81);History of falling (Z91.81)                Time: 9021-1155 OT Time Calculation (min): 27 min Charges:  OT General Charges $OT Visit: 1 Visit OT Evaluation $OT Eval Moderate Complexity: 1 Mod OT Treatments $Self Care/Home Management : 8-22 mins    Brenda Alexanders [facility] 08/27/2018, 2:09 PM

## 2018-08-27 NOTE — Care Management Note (Addendum)
Case Management Note  Patient Details  Name: [name] MRN: [id number] Date of Birth: 11-May-1933  Subjective/Objective:                    Action/Plan:  Spoke to patient at bedside. Patient from home alone, however, has two daughters and a son that live close by and help her.   She is active with [facility] home health PT/OT and wants to continue.  Cory with [facility] aware. Patient already has walker and 3 in 1 at home.   Provided information on [facility].  Expected Discharge Date:  08/30/18               Expected Discharge Plan:  [place]  In-House Referral:     Discharge planning Services  CM Consult  Post Acute Care Choice:  Home Health Choice offered to:  Patient  DME Arranged:  N/A DME Agency:  NA  HH Arranged:  PT, OT HH Agency:  [place]  Status of Service:  In process, will continue to follow  If discussed at Long Length of Stay Meetings, dates discussed:    Additional Comments:  Marilu Favre, RN 08/27/2018, 11:06 AM

## 2018-08-27 NOTE — Care Management Important Message (Signed)
Important Message  Patient Details  Name: Brenda Alexander MRN: [id number] Date of Birth: [birth year]   Medicare Important Message Given:  Yes    Orbie Pyo 08/27/2018, 3:26 PM

## 2018-08-27 NOTE — Progress Notes (Signed)
1 Day Post-Op   Subjective/Chief Complaint: No flatus feels ok   Objective: Vital signs in last 24 hours: Temp:  [97.7 F (36.5 C)-99.2 F (37.3 C)] 98.7 F (37.1 C) (01/31 0552) Pulse Rate:  [71-100] 80 (01/31 0552) Resp:  [18-23] 18 (01/31 0552) BP: (153-198)/(71-93) 171/75 (01/31 0552) SpO2:  [96 %-99 %] 98 % (01/31 0552) Last BM Date: 08/25/18  Intake/Output from previous day: 01/30 0701 - 01/31 0700 In: 1756.8 [I.V.:1456.8; IV Piggyback:300] Out: 495 [Urine:475; Blood:20] Intake/Output this shift: No intake/output data recorded.  Incision/Wound:CDI soft ND   Lab Results:  Recent Labs    08/26/18 0325 08/27/18 0122  WBC 7.2 7.9  HGB 11.2* 11.0*  HCT 36.1 35.7*  PLT 241 243   BMET Recent Labs    08/26/18 0848 08/27/18 0122  NA 137 140  K 3.2* 3.7  CL 100 104  CO2 25 26  GLUCOSE 96 90  BUN 11 11  CREATININE 0.73 0.74  CALCIUM 9.0 8.7*   PT/INR No results for input(s): LABPROT, INR in the last 72 hours. ABG No results for input(s): PHART, HCO3 in the last 72 hours.  Invalid input(s): PCO2, PO2  Studies/Results: Ct Abdomen Pelvis W Contrast  Result Date: 08/25/2018 CLINICAL DATA:  Abdominal pain beginning at 6 a.m. Recent hospitalization for bowel obstruction. History of endometrial cancer, status post hysterectomy. EXAM: CT ABDOMEN AND PELVIS WITH CONTRAST TECHNIQUE: Multidetector CT imaging of the abdomen and pelvis was performed using the standard protocol following bolus administration of intravenous contrast. CONTRAST:  178mL OMNIPAQUE IOHEXOL 300 MG/ML  SOLN COMPARISON:  CT abdomen and pelvis July 22, 2018 FINDINGS: LOWER CHEST: Lung bases are clear. Included heart size is normal. Mild coronary artery calcifications. No pericardial effusion. HEPATOBILIARY: 2.5 cm calcified gallstone without CT findings of acute cholecystitis. Calcified hepatic granulomas, liver is otherwise normal. PANCREAS: Normal. SPLEEN: Normal. ADRENALS/URINARY TRACT: Kidneys  are orthotopic, demonstrating symmetric enhancement. Punctate bilateral nephrolithiasis versus early contrast excretion. No hydronephrosis or solid renal masses. Two homogeneously hypodense benign-appearing cyst LEFT kidney measure to 3.6 cm, 1 cm RIGHT interpolar cyst associated with scarring. The unopacified ureters are normal in course and caliber. Delayed imaging through the kidneys demonstrates symmetric prompt contrast excretion within the proximal urinary collecting system. Urinary bladder is partially distended, no discrete mass identified today though sensitivity decreased by lack of contrast within the bladder. Normal adrenal glands. STOMACH/BOWEL: Evaluation of bowel pathology decreased without oral contrast. Mild colonic diverticulosis. Single loop of small bowel mural thickening dilated to 2.5 cm in the pelvis. Small bowel feces proximal to the edematous bowel loop. Scattered small bowel air-fluid levels, small bowel dilated to 2.9 cm. The appendix is not discretely identified, however there are no inflammatory changes in the right lower quadrant. Mild stool distended rectum, unlikely to reflect fecal impaction. VASCULAR/LYMPHATIC: Aortoiliac vessels are normal in course and caliber. Moderate to severe calcific atherosclerosis. No lymphadenopathy by CT size criteria. REPRODUCTIVE: Status post hysterectomy. OTHER: Minimal perihepatic and intraperitoneal pelvic free fluid. MUSCULOSKELETAL: Nonacute. Small fat containing LEFT inguinal hernia with small amount of fluid. Anterior abdominal wall scarring. Grade 1 L4-5 anterolisthesis without spondylolysis. Mild old T12 compression fracture. IMPRESSION: 1. Single loop of edematous small bowel in pelvis with top-normal distension of proximal small bowel with air-fluid levels concerning for early partial recurrent small bowel obstruction, less likely ileus. 2. Small volume ascites. 3. Cholelithiasis without acute cholecystitis. 4. Aortic Atherosclerosis  (ICD10-I70.0). Acute findings discussed with and reconfirmed by Dr.PETER MESSICK on 08/25/2018 at 3:44 pm.  Electronically Signed   By: Elon Alas M.D.   On: 08/25/2018 15:46   Dg Abd Portable 1v  Result Date: 08/25/2018 CLINICAL DATA:  Small bowel obstruction. EXAM: PORTABLE ABDOMEN - 1 VIEW COMPARISON:  CT abdomen pelvis from today. FINDINGS: Enteric tube in place with the tip in the stomach. Proximal side port in the distal esophagus. Mildly dilated small bowel loops seen on CT are less apparent on x-ray. Excreted contrast in the renal collecting systems and ureters. IMPRESSION: 1. Enteric tube tip in the stomach with proximal side port in the distal esophagus. Recommend advancement 6-7 cm. Electronically Signed   By: Titus Dubin M.D.   On: 08/25/2018 17:28    Anti-infectives: Anti-infectives (From admission, onward)   Start     Dose/Rate Route Frequency Ordered Stop   08/26/18 0600  ciprofloxacin (CIPRO) IVPB 400 mg     400 mg 200 mL/hr over 60 Minutes Intravenous On call to O.R. 08/26/18 0107 08/26/18 1207      Assessment/Plan: RECURRENT SBO partial Laparoscopy showed resolved SBO  s/p Procedure(s): LAPAROSCOPY DIAGNOSTIC (N/A) Patient Active Problem List   Diagnosis Date Noted  . SBO (small bowel obstruction) ([place]) 07/22/2018  . Leukocytosis 07/22/2018  . Hypokalemia 07/22/2018  . Type 2 diabetes mellitus ([place]) 07/22/2018  . 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 Physical deconditioning 07/22/2018  . 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 PMB (postmenopausal bleeding) 04/09/2018  . Bladder mass   . Symptomatic anemia 01/11/2018  . HTN (hypertension) 01/11/2018  . Coronary artery disease 01/11/2018    OOB   NPO for now   Ambulate   IS      LOS: 2 days    Brenda Alexander 08/27/2018

## 2018-08-27 NOTE — Progress Notes (Signed)
Consult NOTE    Brenda Alexander  VPX:[id number] DOB: 1933-04-23 DOA: 08/25/2018 PCP: Brenda Ebbs, MD   Brief Narrative: Per HPI Brenda Alexander is Brenda Alexander 85 y.o. female with medical history significant of HTN; HLD; DM; and CAD presenting with abdominal pain.  She developed abdominal pain and n/v with anything she ate.  Symptoms started yesterday or maybe Monday.  These were the same symptoms as during last hospitalization.  She had Brenda Alexander TAH in October - she had been SOB, was found to be anemic, transfused, was losing blood vaginally.  Uterine biopsy with cancer.  Referred to Dr. Denman Alexander and had Brenda Alexander complete hysterectomy on 10/21.  She reports doing great but her daughter reports decreased PO, Brenda Alexander little weaker, slow to progress.  She developed an SBO on 12/26, she had an NG tube placed, and she got better and was hospitalized for 3 days without need for intervention.  She remained weak but otherwise has done well.  She did slide out of bed Brenda Alexander couple of times.  She was suggested to have SNF last time and she declined and family agreed, but now they understand that this may be needed.  Her BP has been running "high" since December.  She does not miss medication doses.  She does not have h/o irregular heart beat  Assessment & Plan:   Principal Problem:   SBO (small bowel obstruction) (HCC) Active Problems:   HTN (hypertension)   Endometrioid adenocarcinoma of uterus (HCC)   Type 2 diabetes mellitus ([place])   SBO -Patient with admission for robot-assisted TAH due to h/o grade 1 endometrial CA; uncomplicated surgery and d/c 05/25/18 -She returned 12/26-29 with SBO -She presents today with 1-2 days of recurrent symptoms of acute onset of abdominal pain with n/v, abdominal distention, and CT findings c/w SBO -s/p diagnostic laparoscopy by surgery on 1/30 (see op note) -NPO for bowel rest -NG tube in place -IVF hydration -Pain control with morphine  Diet-controlled DM -Last A1c was 5.0 -SSI, sensitive -BG  stable  HTN -Elevated yesterday to ~462 systolic, improved today, continue to monitor -Resume enalapril today.  Continue amlodipine, clonidine, isosorbide mononitrate, metoprolol -Continue to adjust meds as needed -prn hydralazine  Gynecologic malignancy -Patient with admission for robot-assisted TAH due to h/o grade 1 endometrial CA; uncomplicated surgery and d/c 05/25/18 -Of note, she also had been noted to have Brenda Alexander bladder mass in 6/19 and 12/19; cystoscopy was unrevealing (per previous notes) and she was recommended to have outpatient f/u -There is no such mention on CT today (rads also noted they were unable to see this), although there is ascites fluid noted -Discussed need for outpatient follow up with urology given above  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 in 12/19 and places patient at increased risk for GB CA development -Defer to surgery service, will need continued outpatient f/u for this  Deconditioning -Will need PT/OT post-operatively -Previously recommended for SNF and declined, will need possible placement  Irregular heart beat -Patient reportedly told by EMS she had new-onset afib -Will place on telemetry for 24 hours - sinus -Exam with regular rate, some ectopy -EKG with sinus rhythm -low suspicion for afib, will d/c tele today  (she does have risk factors for this with dilated LA, follow outpatient)  HFpEF: echo 1/30 with EF 65%, severe hypertrophy of basal septum, impaired relaxation diastolic filling patterns, elevated RVSP (see report) Holding lasix for now, appears euvolemic Continue BP meds as noted above She'll need outpatient cardiology follow up  Elevated liver  enzymes Improving, mild Follow, CT without concerning findings Follow hepatitis panel (negative)  DVT prophylaxis: lovenox Code Status: DNR Family Communication: noneat bedside Disposition Plan: per surgery   Consultants:   Surgery is  primary  Procedures: Echo IMPRESSIONS    1. The left ventricle has >65%. The cavity size is normal. There is Severe hypertrophy of the basal septum with otherwise mild concentric hypertrophy. Peak velocity 428 cm/s, peak gradient 73 mmHg at rest. left ventricular hypertrophy. Echo evidence of  impaired relaxation diastolic filling patterns. Elevated mean left atrial pressure.  2. There was evidence of Brenda Alexander mid cavitary LV graident. Peak velocity 428 cm/s, peak gradient 73 mmHg at rest.  3. Severely dilated left atrial size.  4. Normal tricuspid valve.  5. The aortic valve tricuspid. There is mild thickening and mild calcification of the aortic valve. Aortic valve regurgitation is mild by color flow Doppler. The calculated aortic valve area is 1.67 cm consistent with mild stenosis.  6. Normal right atrial size.  7. No atrial level shunt detected by color flow Doppler.  8. The right ventricle is in size. There is normal systolic function. Right ventricular systolic pressure is moderately elevated with an estimated pressure of 53.9 mmHg.  9. The aortic root and ascending aortaare normal is size and structure.  Antimicrobials:  Anti-infectives (From admission, onward)   Start     Dose/Rate Route Frequency Ordered Stop   08/26/18 0600  ciprofloxacin (CIPRO) IVPB 400 mg     400 mg 200 mL/hr over 60 Minutes Intravenous On call to O.R. 08/26/18 0107 08/26/18 1207     Subjective: Feels ok post op No flatus or BM  Objective: Vitals:   08/26/18 2114 08/26/18 2310 08/27/18 0149 08/27/18 0552  BP: (!) 177/78 (!) 153/71 (!) 169/83 (!) 171/75  Pulse: 94  84 80  Resp: 18  18 18   Temp: 99.2 F (37.3 C)  98.2 F (36.8 C) 98.7 F (37.1 C)  TempSrc: Oral  Oral Oral  SpO2: 97%  98% 98%  Weight:      Height:        Intake/Output Summary (Last 24 hours) at 08/27/2018 0832 Last data filed at 08/27/2018 0313 Gross per 24 hour  Intake 1756.77 ml  Output 495 ml  Net 1261.77 ml   Filed Weights    08/25/18 1101  Weight: 85.3 kg    Examination:  General: No acute distress. Cardiovascular: Heart sounds show Brenda Alexander regular rate, and rhythm.  Lungs: Clear to auscultation bilaterally  Abdomen: NG in place, laparacopic incisions well appearing, soft, nontender Neurological: Alert and oriented 3. Moves all extremities 4. Cranial nerves II through XII grossly intact. Skin: Warm and dry. No rashes or lesions. Extremities: No clubbing or cyanosis. Trace edema. Psychiatric: Mood and affect are normal. Insight and judgment are appropriate.    Data Reviewed: I have personally reviewed following labs and imaging studies  CBC: Recent Labs  Lab 08/25/18 1102 08/26/18 0325 08/27/18 0122  WBC 8.9 7.2 7.9  HGB 13.0 11.2* 11.0*  HCT 40.6 36.1 35.7*  MCV 81.2 80.9 81.3  PLT 254 241 101   Basic Metabolic Panel: Recent Labs  Lab 08/25/18 1102 08/26/18 0848 08/27/18 0122  NA 135 137 140  K 3.6 3.2* 3.7  CL 97* 100 104  CO2 25 25 26   GLUCOSE 169* 96 90  BUN 15 11 11   CREATININE 0.72 0.73 0.74  CALCIUM 9.7 9.0 8.7*  MG  --   --  1.7   GFR: Estimated  Creatinine Clearance: 50.5 mL/min (by C-G formula based on SCr of 0.74 mg/dL). Liver Function Tests: Recent Labs  Lab 08/25/18 1102 08/26/18 0848 08/27/18 0122  AST 87* 52* 45*  ALT 63* 47* 38  ALKPHOS 194* 157* 144*  BILITOT 1.0 1.1 1.0  PROT 7.2 6.2* 5.5*  ALBUMIN 3.5 3.0* 2.6*   Recent Labs  Lab 08/25/18 1102  LIPASE 19   No results for input(s): AMMONIA in the last 168 hours. Coagulation Profile: No results for input(s): INR, PROTIME in the last 168 hours. Cardiac Enzymes: No results for input(s): CKTOTAL, CKMB, CKMBINDEX, TROPONINI in the last 168 hours. BNP (last 3 results) No results for input(s): PROBNP in the last 8760 hours. HbA1C: No results for input(s): HGBA1C in the last 72 hours. CBG: Recent Labs  Lab 08/26/18 0829 08/26/18 1319 08/26/18 1705 08/26/18 2111 08/27/18 0528  GLUCAP 95 110* 114*  112* 83   Lipid Profile: No results for input(s): CHOL, HDL, LDLCALC, TRIG, CHOLHDL, LDLDIRECT in the last 72 hours. Thyroid Function Tests: No results for input(s): TSH, T4TOTAL, FREET4, T3FREE, THYROIDAB in the last 72 hours. Anemia Panel: No results for input(s): VITAMINB12, FOLATE, FERRITIN, TIBC, IRON, RETICCTPCT in the last 72 hours. Sepsis Labs: No results for input(s): PROCALCITON, LATICACIDVEN in the last 168 hours.  Recent Results (from the past 240 hour(s))  Surgical pcr screen     Status: None   Collection Time: 08/25/18  7:04 PM  Result Value Ref Range Status   MRSA, PCR NEGATIVE NEGATIVE Final   Staphylococcus aureus NEGATIVE NEGATIVE Final    Comment: (NOTE) The Xpert SA Assay (FDA approved for NASAL specimens in patients 37 years of age and older), is one component of [facility] comprehensive surveillance program. It is not intended to diagnose infection nor to guide or monitor treatment. Performed at [facility], [place] [address], [place], [place] [address]          Radiology Studies: Ct Abdomen Pelvis W Contrast  Result Date: 08/25/2018 CLINICAL DATA:  Abdominal pain beginning at 6 Reshunda Strider.m. Recent hospitalization for bowel obstruction. History of endometrial cancer, status post hysterectomy. EXAM: CT ABDOMEN AND PELVIS WITH CONTRAST TECHNIQUE: Multidetector CT imaging of the abdomen and pelvis was performed using the standard protocol following bolus administration of intravenous contrast. CONTRAST:  139mL OMNIPAQUE IOHEXOL 300 MG/ML  SOLN COMPARISON:  CT abdomen and pelvis July 22, 2018 FINDINGS: LOWER CHEST: Lung bases are clear. Included heart size is normal. Mild coronary artery calcifications. No pericardial effusion. HEPATOBILIARY: 2.5 cm calcified gallstone without CT findings of acute cholecystitis. Calcified hepatic granulomas, liver is otherwise normal. PANCREAS: Normal. SPLEEN: Normal. ADRENALS/URINARY TRACT: Kidneys are orthotopic, demonstrating symmetric  enhancement. Punctate bilateral nephrolithiasis versus early contrast excretion. No hydronephrosis or solid renal masses. Two homogeneously hypodense benign-appearing cyst LEFT kidney measure to 3.6 cm, 1 cm RIGHT interpolar cyst associated with scarring. The unopacified ureters are normal in course and caliber. Delayed imaging through the kidneys demonstrates symmetric prompt contrast excretion within the proximal urinary collecting system. Urinary bladder is partially distended, no discrete mass identified today though sensitivity decreased by lack of contrast within the bladder. Normal adrenal glands. STOMACH/BOWEL: Evaluation of bowel pathology decreased without oral contrast. Mild colonic diverticulosis. Single loop of small bowel mural thickening dilated to 2.5 cm in the pelvis. Small bowel feces proximal to the edematous bowel loop. Scattered small bowel air-fluid levels, small bowel dilated to 2.9 cm. The appendix is not discretely identified, however there are no inflammatory changes in the  right lower quadrant. Mild stool distended rectum, unlikely to reflect fecal impaction. VASCULAR/LYMPHATIC: Aortoiliac vessels are normal in course and caliber. Moderate to severe calcific atherosclerosis. No lymphadenopathy by CT size criteria. REPRODUCTIVE: Status post hysterectomy. OTHER: Minimal perihepatic and intraperitoneal pelvic free fluid. MUSCULOSKELETAL: Nonacute. Small fat containing LEFT inguinal hernia with small amount of fluid. Anterior abdominal wall scarring. Grade 1 L4-5 anterolisthesis without spondylolysis. Mild old T12 compression fracture. IMPRESSION: 1. Single loop of edematous small bowel in pelvis with top-normal distension of proximal small bowel with air-fluid levels concerning for early partial recurrent small bowel obstruction, less likely ileus. 2. Small volume ascites. 3. Cholelithiasis without acute cholecystitis. 4. Aortic Atherosclerosis (ICD10-I70.0). Acute findings discussed with and  reconfirmed by Dr.PETER MESSICK on 08/25/2018 at 3:44 pm. Electronically Signed   By: Elon Alas M.D.   On: 08/25/2018 15:46   Dg Abd Portable 1v  Result Date: 08/25/2018 CLINICAL DATA:  Small bowel obstruction. EXAM: PORTABLE ABDOMEN - 1 VIEW COMPARISON:  CT abdomen pelvis from today. FINDINGS: Enteric tube in place with the tip in the stomach. Proximal side port in the distal esophagus. Mildly dilated small bowel loops seen on CT are less apparent on x-ray. Excreted contrast in the renal collecting systems and ureters. IMPRESSION: 1. Enteric tube tip in the stomach with proximal side port in the distal esophagus. Recommend advancement 6-7 cm. Electronically Signed   By: Titus Dubin M.D.   On: 08/25/2018 17:28        Scheduled Meds: . amLODipine  5 mg Oral Daily  . cloNIDine  0.2 mg Oral BID  . cycloSPORINE  1 drop Both Eyes BID  . enalapril  20 mg Oral BID  . enoxaparin (LOVENOX) injection  40 mg Subcutaneous Q24H  . insulin aspart  0-15 Units Subcutaneous TID WC  . insulin aspart  0-5 Units Subcutaneous QHS  . isosorbide mononitrate  20 mg Oral Daily  . mouth rinse  15 mL Mouth Rinse BID  . metoprolol succinate  50 mg Oral Daily  . simvastatin  20 mg Oral QHS   Continuous Infusions: . sodium chloride 75 mL/hr at 08/27/18 0226  . lactated ringers Stopped (08/26/18 1314)     LOS: 2 days    Time spent: over 30 min    Fayrene Helper, MD [facility] Pager AMION  If 7PM-7AM, please contact night-coverage [email or web address] Password [facility] 08/27/2018, 8:32 AM

## 2018-08-27 NOTE — Evaluation (Signed)
Physical Therapy Evaluation Patient Details Name: Brenda Alexander MRN: [id number] DOB: 1933/02/23 Today's Date: 08/27/2018   History of Present Illness  85 yo admitted with SBO s/p laparoscopy 1/30. PMhx: uterine CA s/p hysterectomy 10/21, HTN, HLD, DM, CAD  Clinical Impression  Pt very pleasant and eager to get OOB. Pt reports getting to [facility] but no other mobility acutely. Pt with decreased strength, balance and activity tolerance with several recent falls off bed and chair at home who will benefit from acute therapy to maximize mobility, function and safety. Pt encouraged to mobilize daily with nursing and to have son assist at home. Pt reports daughter can stay with her but will sleep during the day and work at night and son can stay during the day. Pt also recommended to obtain life alert with 2 recent falls of pt unable to get off the floor on her own and phone not always present with her. Pt very clearly stating SNF is not an option and she will return home with family support.     Follow Up Recommendations Home health PT;Supervision for mobility/OOB    Equipment Recommendations  None recommended by PT    Recommendations for Other Services       Precautions / Restrictions Precautions Precautions: Fall Precaution Comments: NGT      Mobility  Bed Mobility Overal bed mobility: Modified Independent             General bed mobility comments: use of rail rollling right and side to sit  Transfers Overall transfer level: Needs assistance   Transfers: Sit to/from Stand Sit to Stand: Min guard         General transfer comment: cues for hand placement with increased time to rise  Ambulation/Gait Ambulation/Gait assistance: Min assist Gait Distance (Feet): 40 Feet Assistive device: Rolling Brenda (2 wheeled) Gait Pattern/deviations: Step-through pattern;Decreased stride length;Trunk flexed   Gait velocity interpretation: 1.31 - 2.62 ft/sec, indicative of limited community  ambulator General Gait Details: cues for posture and position in RW, min assist at times to direct RW. Pt with tendency to keep RW off to her right  Stairs            Wheelchair Mobility    Modified Rankin (Stroke Patients Only)       Balance Overall balance assessment: Needs assistance;History of Falls   Sitting balance-Leahy Scale: Fair       Standing balance-Leahy Scale: Poor                               Pertinent Vitals/Pain Pain Assessment: No/denies pain    Home Living Family/patient expects to be discharged to:: Private residence Living Arrangements: Alone Available Help at Discharge: Family;Available PRN/intermittently Type of Home: Apartment Home Access: Level entry     Home Layout: One level Home Equipment: Brenda - 2 wheels;Cane - single point;Hand held shower head;Bedside commode;Shower seat      Prior Function Level of Independence: Needs assistance   Gait / Transfers Assistance Needed: Pt walking with Brenda at all times  ADL's / Homemaking Assistance Needed: pt and family shared housework and cooking, family drives  Comments: 2 falls recently     Hand Dominance   Dominant Hand: Right    Extremity/Trunk Assessment   Upper Extremity Assessment Upper Extremity Assessment: Generalized weakness    Lower Extremity Assessment Lower Extremity Assessment: Generalized weakness    Cervical / Trunk Assessment Cervical / Trunk Assessment: Kyphotic  Communication   Communication: No difficulties  Cognition Arousal/Alertness: Awake/alert Behavior During Therapy: WFL for tasks assessed/performed Overall Cognitive Status: Within Functional Limits for tasks assessed                                        General Comments      Exercises     Assessment/Plan    PT Assessment Patient needs continued PT services  PT Problem List Decreased strength;Decreased balance;Decreased mobility;Decreased knowledge of use  of DME;Decreased activity tolerance       PT Treatment Interventions Gait training;Therapeutic activities;Therapeutic exercise;DME instruction;Functional mobility training;Balance training;Patient/family education;Neuromuscular re-education    PT Goals (Current goals can be found in the Care Plan section)  Acute Rehab PT Goals Patient Stated Goal: return home and go out with my groups PT Goal Formulation: With patient Time For Goal Achievement: 09/10/18 Potential to Achieve Goals: Good    Frequency Min 3X/week   Barriers to discharge Decreased caregiver support pt states daughter can stay with her but will be asleep during the day and works nights    Co-evaluation               AM-PAC PT "6 Clicks" Mobility  Outcome Measure Help needed turning from your back to your side while in a flat bed without using bedrails?: A Little Help needed moving from lying on your back to sitting on the side of a flat bed without using bedrails?: A Little Help needed moving to and from a bed to a chair (including a wheelchair)?: A Little Help needed standing up from a chair using your arms (e.g., wheelchair or bedside chair)?: A Little Help needed to walk in hospital room?: A Little Help needed climbing 3-5 steps with a railing? : A Lot 6 Click Score: 17    End of Session   Activity Tolerance: Patient tolerated treatment well Patient left: in chair;with call bell/phone within reach;with chair alarm set Nurse Communication: Mobility status PT Visit Diagnosis: Other abnormalities of gait and mobility (R26.89);Muscle weakness (generalized) (M62.81);History of falling (Z91.81)    Time: 1517-6160 PT Time Calculation (min) (ACUTE ONLY): 21 min   Charges:   PT Evaluation $PT Eval Moderate Complexity: 1 Mod          [place], PT Acute Rehabilitation Services Pager: [phone] Office: [phone]   Ravenne Wayment B Dash Cardarelli 08/27/2018, 10:22 AM

## 2018-08-28 LAB — CBC
HCT: 33 % — ABNORMAL LOW (ref 36.0–46.0)
Hemoglobin: 10.6 g/dL — ABNORMAL LOW (ref 12.0–15.0)
MCH: 25.9 pg — ABNORMAL LOW (ref 26.0–34.0)
MCHC: 32.1 g/dL (ref 30.0–36.0)
MCV: 80.7 fL (ref 80.0–100.0)
Platelets: 263 10*3/uL (ref 150–400)
RBC: 4.09 MIL/uL (ref 3.87–5.11)
RDW: 16.5 % — ABNORMAL HIGH (ref 11.5–15.5)
WBC: 5.7 10*3/uL (ref 4.0–10.5)
nRBC: 0 % (ref 0.0–0.2)

## 2018-08-28 LAB — COMPREHENSIVE METABOLIC PANEL
ALT: 37 U/L (ref 0–44)
AST: 52 U/L — ABNORMAL HIGH (ref 15–41)
Albumin: 2.7 g/dL — ABNORMAL LOW (ref 3.5–5.0)
Alkaline Phosphatase: 136 U/L — ABNORMAL HIGH (ref 38–126)
Anion gap: 11 (ref 5–15)
BUN: 10 mg/dL (ref 8–23)
CO2: 26 mmol/L (ref 22–32)
Calcium: 8.8 mg/dL — ABNORMAL LOW (ref 8.9–10.3)
Chloride: 101 mmol/L (ref 98–111)
Creatinine, Ser: 0.67 mg/dL (ref 0.44–1.00)
GFR calc Af Amer: >60 mL/min (ref >60)
GFR calc non Af Amer: >60 mL/min (ref >60)
Glucose, Bld: 97 mg/dL (ref 70–99)
Potassium: 3.1 mmol/L — ABNORMAL LOW (ref 3.5–5.1)
Sodium: 138 mmol/L (ref 135–145)
Total Bilirubin: 0.7 mg/dL (ref 0.3–1.2)
Total Protein: 5.5 g/dL — ABNORMAL LOW (ref 6.5–8.1)

## 2018-08-28 LAB — MAGNESIUM: Magnesium: 1.7 mg/dL (ref 1.7–2.4)

## 2018-08-28 LAB — GLUCOSE, CAPILLARY
Glucose-Capillary: 114 mg/dL — ABNORMAL HIGH (ref 70–99)
Glucose-Capillary: 116 mg/dL — ABNORMAL HIGH (ref 70–99)
Glucose-Capillary: 117 mg/dL — ABNORMAL HIGH (ref 70–99)
Glucose-Capillary: 93 mg/dL (ref 70–99)
Glucose-Capillary: 99 mg/dL (ref 70–99)
Glucose-Capillary: 99 mg/dL (ref 70–99)

## 2018-08-28 MED ORDER — AMLODIPINE BESYLATE 10 MG PO TABS
10.0000 mg | ORAL_TABLET | Freq: Every day | ORAL | Status: DC
  Administered 2018-08-29 – 2018-08-30 (×2): 10 mg via ORAL
  Filled 2018-08-28 (×3): qty 1

## 2018-08-28 MED ORDER — POTASSIUM CHLORIDE 20 MEQ PO PACK
40.0000 meq | PACK | ORAL | Status: AC
  Administered 2018-08-28 (×2): 40 meq via ORAL
  Filled 2018-08-28 (×2): qty 2

## 2018-08-28 NOTE — Plan of Care (Signed)
  Problem: Activity: Goal: Risk for activity intolerance will decrease Outcome: Progressing   Problem: Pain Managment: Goal: General experience of comfort will improve Outcome: Progressing   

## 2018-08-28 NOTE — Progress Notes (Signed)
Patient ID: Brenda Alexander, female   DOB: Mar 25, 1933, 85 y.o.   MRN: [id number] [facility] Surgery Progress Note:   2 Days Post-Op  Subjective: Mental status is clear;  No complaints this morning Objective: Vital signs in last 24 hours: Temp:  [98.4 F (36.9 C)-98.8 F (37.1 C)] 98.4 F (36.9 C) (02/01 0424) Pulse Rate:  [78-87] 79 (02/01 0424) Resp:  [18-20] 18 (02/01 0424) BP: (139-180)/(72-82) 171/72 (02/01 0424) SpO2:  [97 %-100 %] 100 % (02/01 0424)  Intake/Output from previous day: 01/31 0701 - 02/01 0700 In: 1994.9 [I.V.:1994.9] Out: 1900 [Urine:1900] Intake/Output this shift: No intake/output data recorded.  Physical Exam: Work of breathing is not labored;  Abdomen is soft and nontender;  + flatus  Lab Results:  Results for orders placed or performed during the hospital encounter of 08/25/18 (from the past 48 hour(s))  Glucose, capillary     Status: None   Collection Time: 08/26/18  8:29 AM  Result Value Ref Range   Glucose-Capillary 95 70 - 99 mg/dL  Comprehensive metabolic panel     Status: Abnormal   Collection Time: 08/26/18  8:48 AM  Result Value Ref Range   Sodium 137 135 - 145 mmol/L   Potassium 3.2 (L) 3.5 - 5.1 mmol/L   Chloride 100 98 - 111 mmol/L   CO2 25 22 - 32 mmol/L   Glucose, Bld 96 70 - 99 mg/dL   BUN 11 8 - 23 mg/dL   Creatinine, Ser 0.73 0.44 - 1.00 mg/dL   Calcium 9.0 8.9 - 10.3 mg/dL   Total Protein 6.2 (L) 6.5 - 8.1 g/dL   Albumin 3.0 (L) 3.5 - 5.0 g/dL   AST 52 (H) 15 - 41 U/L   ALT 47 (H) 0 - 44 U/L   Alkaline Phosphatase 157 (H) 38 - 126 U/L   Total Bilirubin 1.1 0.3 - 1.2 mg/dL   GFR calc non Af Amer >60 >60 mL/min   GFR calc Af Amer >60 >60 mL/min   Anion gap 12 5 - 15    Comment: Performed at [facility], [address], [place], [place] [address]  Hepatitis panel, acute     Status: None   Collection Time: 08/26/18  8:48 AM  Result Value Ref Range   Hepatitis B Surface Ag Negative Negative   HCV Ab <0.1 0.0 - 0.9 s/co  ratio    Comment: (NOTE)                                  Negative:     < 0.8                             Indeterminate: 0.8 - 0.9                                  Positive:     > 0.9 The CDC recommends that a positive HCV antibody result be followed up with a HCV Nucleic Acid Amplification test (935701). Performed At: [facility] [place], [place] [id number] Rush Farmer MD PQ:[id number]    Hep A IgM Negative Negative   Hep B C IgM Negative Negative  Glucose, capillary     Status: Abnormal   Collection Time: 08/26/18  1:19 PM  Result Value Ref Range  Glucose-Capillary 110 (H) 70 - 99 mg/dL  Glucose, capillary     Status: Abnormal   Collection Time: 08/26/18  5:05 PM  Result Value Ref Range   Glucose-Capillary 114 (H) 70 - 99 mg/dL  Glucose, capillary     Status: Abnormal   Collection Time: 08/26/18  9:11 PM  Result Value Ref Range   Glucose-Capillary 112 (H) 70 - 99 mg/dL  CBC     Status: Abnormal   Collection Time: 08/27/18  1:22 AM  Result Value Ref Range   WBC 7.9 4.0 - 10.5 K/uL   RBC 4.39 3.87 - 5.11 MIL/uL   Hemoglobin 11.0 (L) 12.0 - 15.0 g/dL   HCT 35.7 (L) 36.0 - 46.0 %   MCV 81.3 80.0 - 100.0 fL   MCH 25.1 (L) 26.0 - 34.0 pg   MCHC 30.8 30.0 - 36.0 g/dL   RDW 16.4 (H) 11.5 - 15.5 %   Platelets 243 150 - 400 K/uL   nRBC 0.0 0.0 - 0.2 %    Comment: Performed at [facility], [place] [address], [place], [place] [address]  Comprehensive metabolic panel     Status: Abnormal   Collection Time: 08/27/18  1:22 AM  Result Value Ref Range   Sodium 140 135 - 145 mmol/L   Potassium 3.7 3.5 - 5.1 mmol/L   Chloride 104 98 - 111 mmol/L   CO2 26 22 - 32 mmol/L   Glucose, Bld 90 70 - 99 mg/dL   BUN 11 8 - 23 mg/dL   Creatinine, Ser 0.74 0.44 - 1.00 mg/dL   Calcium 8.7 (L) 8.9 - 10.3 mg/dL   Total Protein 5.5 (L) 6.5 - 8.1 g/dL   Albumin 2.6 (L) 3.5 - 5.0 g/dL   AST 45 (H) 15 - 41 U/L   ALT 38 0 - 44 U/L   Alkaline Phosphatase 144 (H) 38 -  126 U/L   Total Bilirubin 1.0 0.3 - 1.2 mg/dL   GFR calc non Af Amer >60 >60 mL/min   GFR calc Af Amer >60 >60 mL/min   Anion gap 10 5 - 15    Comment: Performed at [facility], [place] [address], [place], [place] [address]  Magnesium     Status: None   Collection Time: 08/27/18  1:22 AM  Result Value Ref Range   Magnesium 1.7 1.7 - 2.4 mg/dL    Comment: Performed at [facility] [address], [place], [place] [address]  Glucose, capillary     Status: None   Collection Time: 08/27/18  5:28 AM  Result Value Ref Range   Glucose-Capillary 83 70 - 99 mg/dL  Glucose, capillary     Status: None   Collection Time: 08/27/18  8:31 AM  Result Value Ref Range   Glucose-Capillary 75 70 - 99 mg/dL  Glucose, capillary     Status: None   Collection Time: 08/27/18 12:06 PM  Result Value Ref Range   Glucose-Capillary 86 70 - 99 mg/dL  Glucose, capillary     Status: None   Collection Time: 08/27/18  5:10 PM  Result Value Ref Range   Glucose-Capillary 76 70 - 99 mg/dL  Glucose, capillary     Status: None   Collection Time: 08/27/18  8:22 PM  Result Value Ref Range   Glucose-Capillary 89 70 - 99 mg/dL  Glucose, capillary     Status: None   Collection Time: 08/28/18 12:55 AM  Result Value Ref Range   Glucose-Capillary  93 70 - 99 mg/dL  CBC     Status: Abnormal   Collection Time: 08/28/18  3:02 AM  Result Value Ref Range   WBC 5.7 4.0 - 10.5 K/uL   RBC 4.09 3.87 - 5.11 MIL/uL   Hemoglobin 10.6 (L) 12.0 - 15.0 g/dL   HCT 33.0 (L) 36.0 - 46.0 %   MCV 80.7 80.0 - 100.0 fL   MCH 25.9 (L) 26.0 - 34.0 pg   MCHC 32.1 30.0 - 36.0 g/dL   RDW 16.5 (H) 11.5 - 15.5 %   Platelets 263 150 - 400 K/uL   nRBC 0.0 0.0 - 0.2 %    Comment: Performed at [facility] [address] [place], [place] [address]  Comprehensive metabolic panel     Status: Abnormal   Collection Time: 08/28/18  3:02 AM  Result Value Ref Range   Sodium 138 135 - 145 mmol/L   Potassium 3.1 (L) 3.5 - 5.1 mmol/L    Chloride 101 98 - 111 mmol/L   CO2 26 22 - 32 mmol/L   Glucose, Bld 97 70 - 99 mg/dL   BUN 10 8 - 23 mg/dL   Creatinine, Ser 0.67 0.44 - 1.00 mg/dL   Calcium 8.8 (L) 8.9 - 10.3 mg/dL   Total Protein 5.5 (L) 6.5 - 8.1 g/dL   Albumin 2.7 (L) 3.5 - 5.0 g/dL   AST 52 (H) 15 - 41 U/L   ALT 37 0 - 44 U/L   Alkaline Phosphatase 136 (H) 38 - 126 U/L   Total Bilirubin 0.7 0.3 - 1.2 mg/dL   GFR calc non Af Amer >60 >60 mL/min   GFR calc Af Amer >60 >60 mL/min   Anion gap 11 5 - 15    Comment: Performed at [facility], [place] [address], [place], [place] [address]  Magnesium     Status: None   Collection Time: 08/28/18  3:02 AM  Result Value Ref Range   Magnesium 1.7 1.7 - 2.4 mg/dL    Comment: Performed at [facility] [address], [place], [place] [address]  Glucose, capillary     Status: None   Collection Time: 08/28/18  4:21 AM  Result Value Ref Range   Glucose-Capillary 99 70 - 99 mg/dL    Radiology/Results: No results found.  Anti-infectives: Anti-infectives (From admission, onward)   Start     Dose/Rate Route Frequency Ordered Stop   08/26/18 0600  ciprofloxacin (CIPRO) IVPB 400 mg     400 mg 200 mL/hr over 60 Minutes Intravenous On call to O.R. 08/26/18 0107 08/26/18 1207      Assessment/Plan: Problem List: Patient Active Problem List   Diagnosis Date Noted  . SBO (small bowel obstruction) ([place]) 07/22/2018  . Leukocytosis 07/22/2018  . Hypokalemia 07/22/2018  . Type 2 diabetes mellitus ([place]) 07/22/2018  . 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 Physical deconditioning 07/22/2018  . 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 PMB (postmenopausal bleeding) 04/09/2018  . Bladder mass   . Symptomatic anemia 01/11/2018  . HTN (hypertension) 01/11/2018  . Coronary artery disease 01/11/2018    NG out; will offer clear liquids today.  Improved.  2 Days Post-Op    LOS: 3 days   Matt B. Hassell Done, MD, [facility] [phone] beeper [phone]  08/28/2018 8:01 AM

## 2018-08-28 NOTE — Progress Notes (Signed)
Consult NOTE    [name]  NAT:[id number] DOB: [birth year] DOA: 08/25/2018 PCP: Nolene Ebbs, MD   Brief Narrative: Per HPI Brenda Alexander is Brenda Alexander 85 y.o. female with medical history significant of HTN; HLD; DM; and CAD presenting with abdominal pain.  She developed abdominal pain and n/v with anything she ate.  Symptoms started yesterday or maybe Monday.  These were the same symptoms as during last hospitalization.  She had [name] TAH in October - she had been SOB, was found to be anemic, transfused, was losing blood vaginally.  Uterine biopsy with cancer.  Referred to Dr. Denman George and had Savaya Hakes complete hysterectomy on 10/21.  She reports doing great but her daughter reports decreased PO, [name] little weaker, slow to progress.  She developed an SBO on 12/26, she had an NG tube placed, and she got better and was hospitalized for 3 days without need for intervention.  She remained weak but otherwise has done well.  She did slide out of bed Bailey Faiella couple of times.  She was suggested to have SNF last time and she declined and family agreed, but now they understand that this may be needed.  Her BP has been running "high" since December.  She does not miss medication doses.  She does not have h/o irregular heart beat  Assessment & Plan:   Principal Problem:   SBO (small bowel obstruction) (H[facility]) Active Problems:   HTN (hypertension)   Endometrioid adenocarcinoma of uterus (HCC)   Type 2 diabetes mellitus ([place])   SBO -Patient with admission for robot-assisted TAH due to h/o grade 1 endometrial CA; uncomplicated surgery and d/c 05/25/18 -She returned 12/26-29 with SBO -She presents today with 1-2 days of recurrent symptoms of acute onset of abdominal pain with n/v, abdominal distention, and CT findings c/w SBO -s/p diagnostic laparoscopy by surgery on 1/30 (see op note) -clear liquid diet -NG tube out now per surgery -IVF hydration -Pain control with morphine  Diet-controlled DM -Last A1c was 5.0 -SSI,  sensitive -BG stable  HTN -BP continues to be elevated, will increase amlodipine to 10 mg -enalapril.  Continue amlodipine, clonidine, isosorbide mononitrate, metoprolol -Continue to adjust meds as needed -prn hydralazine  Gynecologic malignancy -Patient with admission for robot-assisted TAH due to h/o grade 1 endometrial CA; uncomplicated surgery and d/c 05/25/18 -Of note, she also had been noted to have Arlander Gillen bladder mass in 6/19 and 12/19; cystoscopy was unrevealing (per previous notes) and she was recommended to have outpatient f/u -There is no such mention on CT today (rads also noted they were unable to see this), although there is ascites fluid noted -Discussed need for outpatient follow up with urology given above  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 in 12/19 and places patient at increased risk for GB CA development -Defer to surgery service, will need continued outpatient f/u for this  Deconditioning -Will need PT/OT post-operatively -Previously recommended for SNF and declined, will need possible placement  Irregular heart beat -Patient reportedly told by EMS she had new-onset afib -Will place on telemetry for 24 hours - sinus -Exam with regular rate, some ectopy -EKG with sinus rhythm -low suspicion for afib, will d/c tele today  (she does have risk factors for this with dilated LA, follow outpatient)  HFpEF: echo 1/30 with EF 65%, severe hypertrophy of basal septum, impaired relaxation diastolic filling patterns, elevated RVSP (see report) Holding lasix for now, appears euvolemic Continue BP meds as noted above She'll need outpatient cardiology follow up  Elevated liver  enzymes Improving, mild Follow, CT without concerning findings Follow hepatitis panel (negative) Follow outpatient  Hypokalemia: replace, follow  DVT prophylaxis: lovenox Code Status: DNR Family Communication: none at bedside Disposition Plan: per surgery   Consultants:    Surgery is primary  Procedures: Echo IMPRESSIONS    1. The left ventricle has >65%. The cavity size is normal. There is Severe hypertrophy of the basal septum with otherwise mild concentric hypertrophy. Peak velocity 428 cm/s, peak gradient 73 mmHg at rest. left ventricular hypertrophy. Echo evidence of  impaired relaxation diastolic filling patterns. Elevated mean left atrial pressure.  2. There was evidence of Shanin Szymanowski mid cavitary LV graident. Peak velocity 428 cm/s, peak gradient 73 mmHg at rest.  3. Severely dilated left atrial size.  4. Normal tricuspid valve.  5. The aortic valve tricuspid. There is mild thickening and mild calcification of the aortic valve. Aortic valve regurgitation is mild by color flow Doppler. The calculated aortic valve area is 1.67 cm consistent with mild stenosis.  6. Normal right atrial size.  7. No atrial level shunt detected by color flow Doppler.  8. The right ventricle is in size. There is normal systolic function. Right ventricular systolic pressure is moderately elevated with an estimated pressure of 53.9 mmHg.  9. The aortic root and ascending aortaare normal is size and structure.  Antimicrobials:  Anti-infectives (From admission, onward)   Start     Dose/Rate Route Frequency Ordered Stop   08/26/18 0600  ciprofloxacin (CIPRO) IVPB 400 mg     400 mg 200 mL/hr over 60 Minutes Intravenous On call to O.R. 08/26/18 0107 08/26/18 1207     Subjective: Passing flatus No BM yet Feels ok  Objective: Vitals:   08/27/18 2131 08/28/18 0058 08/28/18 0424 08/28/18 1354  BP: (!) 180/72 (!) 145/73 (!) 171/72 (!) 170/77  Pulse: 87 78 79 84  Resp:  20 18 16   Temp:  98.7 F (37.1 C) 98.4 F (36.9 C)   TempSrc:  Oral Oral   SpO2:  97% 100% 99%  Weight:      Height:        Intake/Output Summary (Last 24 hours) at 08/28/2018 1546 Last data filed at 08/28/2018 1300 Gross per 24 hour  Intake 2334.85 ml  Output 2200 ml  Net 134.85 ml   Filed Weights    08/25/18 1101  Weight: 85.3 kg    Examination:  General: No acute distress. Cardiovascular: Heart sounds show Taesha Goodell regular rate, and rhythm. Lungs: Clear to auscultation bilaterally  Abdomen: Soft, nontender, nondistended  Neurological: Alert and oriented 3. Moves all extremities 4. Cranial nerves II through XII grossly intact. Skin: Warm and dry. No rashes or lesions. Extremities: No clubbing or cyanosis. No edema.  Psychiatric: Mood and affect are normal. Insight and judgment are appropriate.    Data Reviewed: I have personally reviewed following labs and imaging studies  CBC: Recent Labs  Lab 08/25/18 1102 08/26/18 0325 08/27/18 0122 08/28/18 0302  WBC 8.9 7.2 7.9 5.7  HGB 13.0 11.2* 11.0* 10.6*  HCT 40.6 36.1 35.7* 33.0*  MCV 81.2 80.9 81.3 80.7  PLT 254 241 243 892   Basic Metabolic Panel: Recent Labs  Lab 08/25/18 1102 08/26/18 0848 08/27/18 0122 08/28/18 0302  NA 135 137 140 138  K 3.6 3.2* 3.7 3.1*  CL 97* 100 104 101  CO2 25 25 26 26   GLUCOSE 169* 96 90 97  BUN 15 11 11 10   CREATININE 0.72 0.73 0.74 0.67  CALCIUM 9.7 9.0  8.7* 8.8*  MG  --   --  1.7 1.7   GFR: Estimated Creatinine Clearance: 50.5 mL/min (by C-G formula based on SCr of 0.67 mg/dL). Liver Function Tests: Recent Labs  Lab 08/25/18 1102 08/26/18 0848 08/27/18 0122 08/28/18 0302  AST 87* 52* 45* 52*  ALT 63* 47* 38 37  ALKPHOS 194* 157* 144* 136*  BILITOT 1.0 1.1 1.0 0.7  PROT 7.2 6.2* 5.5* 5.5*  ALBUMIN 3.5 3.0* 2.6* 2.7*   Recent Labs  Lab 08/25/18 1102  LIPASE 19   No results for input(s): AMMONIA in the last 168 hours. Coagulation Profile: No results for input(s): INR, PROTIME in the last 168 hours. Cardiac Enzymes: No results for input(s): CKTOTAL, CKMB, CKMBINDEX, TROPONINI in the last 168 hours. BNP (last 3 results) No results for input(s): PROBNP in the last 8760 hours. HbA1C: No results for input(s): HGBA1C in the last 72 hours. CBG: Recent Labs  Lab  08/27/18 2022 08/28/18 0055 08/28/18 0421 08/28/18 0821 08/28/18 1158  GLUCAP 89 93 99 99 116*   Lipid Profile: No results for input(s): CHOL, HDL, LDLCALC, TRIG, CHOLHDL, LDLDIRECT in the last 72 hours. Thyroid Function Tests: No results for input(s): TSH, T4TOTAL, FREET4, T3FREE, THYROIDAB in the last 72 hours. Anemia Panel: No results for input(s): VITAMINB12, FOLATE, FERRITIN, TIBC, IRON, RETICCTPCT in the last 72 hours. Sepsis Labs: No results for input(s): PROCALCITON, LATICACIDVEN in the last 168 hours.  Recent Results (from the past 240 hour(s))  Surgical pcr screen     Status: None   Collection Time: 08/25/18  7:04 PM  Result Value Ref Range Status   MRSA, PCR NEGATIVE NEGATIVE Final   Staphylococcus aureus NEGATIVE NEGATIVE Final    Comment: (NOTE) The Xpert SA Assay (FDA approved for NASAL specimens in patients 70 years of age and older), is one component of Declyn Delsol comprehensive surveillance program. It is not intended to diagnose infection nor to guide or monitor treatment. Performed at [facility], [place] [address], [place],  [address]          Radiology Studies: No results found.      Scheduled Meds: . amLODipine  5 mg Oral Daily  . cloNIDine  0.2 mg Oral BID  . cycloSPORINE  1 drop Both Eyes BID  . enalapril  20 mg Oral BID  . enoxaparin (LOVENOX) injection  40 mg Subcutaneous Q24H  . insulin aspart  0-5 Units Subcutaneous QHS  . insulin aspart  0-9 Units Subcutaneous TID WC  . isosorbide mononitrate  20 mg Oral Daily  . mouth rinse  15 mL Mouth Rinse BID  . metoprolol succinate  50 mg Oral Daily  . potassium chloride  40 mEq Oral Q4H  . simvastatin  20 mg Oral QHS   Continuous Infusions: . dextrose 5% lactated ringers 75 mL/hr at 08/27/18 1821  . lactated ringers Stopped (08/26/18 1314)     LOS: 3 days    Time spent: over 30 min    Fayrene Helper, MD [facility] Pager AMION  If 7PM-7AM, please contact  night-coverage [email or web address] Password [facility] 08/28/2018, 3:46 PM

## 2018-08-28 NOTE — Progress Notes (Signed)
Occupational Therapy Treatment Patient Details Name: Brenda Alexander MRN: [id number] DOB: [birth year] Today's Date: 08/28/2018    History of present illness 85 yo admitted with SBO s/p laparoscopy 1/30. PMhx: uterine CA s/p hysterectomy 10/21, HTN, HLD, DM, CAD   OT comments  Pt. Seen for skilled OT session. Able to complete toileting and grooming tasks with min guard a.  LB dressing seated with increased time for completion having to bend forward to reach feet. May benefit from A/E for increasing safety and ease with LB ADLS. Will introduce at next session.    Follow Up Recommendations  Home health OT    Equipment Recommendations  None recommended by OT    Recommendations for Other Services      Precautions / Restrictions Precautions Precautions: Fall       Mobility Bed Mobility               General bed mobility comments: pt OOB in recliner upon arrival  Transfers Overall transfer level: Needs assistance Equipment used: Rolling walker (2 wheeled) Transfers: Sit to/from [facility] Sit to Stand: Min assist Stand pivot transfers: Min guard       General transfer comment: cues for hand placement with increased time to rise    Balance                                           ADL either performed or assessed with clinical judgement   ADL Overall ADL's : Needs assistance/impaired     Grooming: Wash/dry hands;Minimal assistance;Standing               Lower Body Dressing: Moderate assistance;Sitting/lateral leans Lower Body Dressing Details (indicate cue type and reason): seated bends forward to reach feet, may benefit from A/E for safety Toilet Transfer: Ambulation;RW;Cueing for safety;Regular Toilet;Min guard   Toileting- Clothing Manipulation and Hygiene: Min guard;Sit to/from stand       Functional mobility during ADLs: Min guard;Cueing for safety;Rolling walker       Vision       Perception     Praxis       Cognition Arousal/Alertness: Awake/alert Behavior During Therapy: WFL for tasks assessed/performed Overall Cognitive Status: Within Functional Limits for tasks assessed                                          Exercises     Shoulder Instructions       General Comments      Pertinent Vitals/ Pain       Pain Assessment: No/denies pain  Home Living                                          Prior Functioning/Environment              Frequency  Min 2X/week        Progress Toward Goals  OT Goals(current goals can now be found in the care plan section)  Progress towards OT goals: Progressing toward goals     Plan Discharge plan remains appropriate    Co-evaluation                 AM-PAC OT "  6 Clicks" Daily Activity     Outcome Measure   Help from another person eating meals?: None Help from another person taking care of personal grooming?: A Little Help from another person toileting, which includes using toliet, bedpan, or urinal?: A Little Help from another person bathing (including washing, rinsing, drying)?: A Lot Help from another person to put on and taking off regular upper body clothing?: A Little Help from another person to put on and taking off regular lower body clothing?: A Lot 6 Click Score: 17    End of Session Equipment Utilized During Treatment: Gait belt;Rolling walker  OT Visit Diagnosis: Other abnormalities of gait and mobility (R26.89);Muscle weakness (generalized) (M62.81);History of falling (Z91.81)   Activity Tolerance Patient tolerated treatment well   Patient Left in chair;with call bell/phone within reach   Nurse Communication          Time: 1540-0867 OT Time Calculation (min): 14 min  Charges: OT General Charges $OT Visit: 1 Visit OT Treatments $Self Care/Home Management : 8-22 mins   Janice Coffin , COTA/L 08/28/2018, 12:16 PM

## 2018-08-29 LAB — CBC
HCT: 34.7 % — ABNORMAL LOW (ref 36.0–46.0)
Hemoglobin: 11.1 g/dL — ABNORMAL LOW (ref 12.0–15.0)
MCH: 25.8 pg — ABNORMAL LOW (ref 26.0–34.0)
MCHC: 32 g/dL (ref 30.0–36.0)
MCV: 80.7 fL (ref 80.0–100.0)
Platelets: 286 10*3/uL (ref 150–400)
RBC: 4.3 MIL/uL (ref 3.87–5.11)
RDW: 16.6 % — ABNORMAL HIGH (ref 11.5–15.5)
WBC: 5.9 10*3/uL (ref 4.0–10.5)
nRBC: 0 % (ref 0.0–0.2)

## 2018-08-29 LAB — GLUCOSE, CAPILLARY
Glucose-Capillary: 105 mg/dL — ABNORMAL HIGH (ref 70–99)
Glucose-Capillary: 141 mg/dL — ABNORMAL HIGH (ref 70–99)
Glucose-Capillary: 85 mg/dL (ref 70–99)
Glucose-Capillary: 116 mg/dL — ABNORMAL HIGH (ref 70–99)

## 2018-08-29 LAB — BASIC METABOLIC PANEL
Anion gap: 8 (ref 5–15)
BUN: 6 mg/dL — ABNORMAL LOW (ref 8–23)
CO2: 26 mmol/L (ref 22–32)
Calcium: 8.9 mg/dL (ref 8.9–10.3)
Chloride: 104 mmol/L (ref 98–111)
Creatinine, Ser: 0.68 mg/dL (ref 0.44–1.00)
GFR calc Af Amer: >60 mL/min (ref >60)
GFR calc non Af Amer: >60 mL/min (ref >60)
Glucose, Bld: 104 mg/dL — ABNORMAL HIGH (ref 70–99)
Potassium: 3.9 mmol/L (ref 3.5–5.1)
Sodium: 138 mmol/L (ref 135–145)

## 2018-08-29 LAB — MAGNESIUM: Magnesium: 1.7 mg/dL (ref 1.7–2.4)

## 2018-08-29 MED ORDER — POLYETHYLENE GLYCOL 3350 17 G PO PACK
17.0000 g | PACK | Freq: Two times a day (BID) | ORAL | Status: DC
  Administered 2018-08-29 (×2): 17 g via ORAL
  Filled 2018-08-29 (×3): qty 1

## 2018-08-29 NOTE — Progress Notes (Signed)
Consult NOTE    Brenda Alexander  SEG:[id number] DOB: 12/22/1932 DOA: 08/25/2018 PCP: Nolene Ebbs, MD   Brief Narrative: Per HPI Brenda Alexander is Brenda Alexander 85 y.o. female with medical history significant of HTN; HLD; DM; and CAD presenting with abdominal pain.  She developed abdominal pain and n/v with anything she ate.  Symptoms started yesterday or maybe Monday.  These were the same symptoms as during last hospitalization.  She had Koda Routon TAH in October - she had been SOB, was found to be anemic, transfused, was losing blood vaginally.  Uterine biopsy with cancer.  Referred to Dr. Denman George and had Corynne Scibilia complete hysterectomy on 10/21.  She reports doing great but her daughter reports decreased PO, Maurilio Puryear little weaker, slow to progress.  She developed an SBO on 12/26, she had an NG tube placed, and she got better and was hospitalized for 3 days without need for intervention.  She remained weak but otherwise has done well.  She did slide out of bed Jakyrie Totherow couple of times.  She was suggested to have SNF last time and she declined and family agreed, but now they understand that this may be needed.  Her BP has been running "high" since December.  She does not miss medication doses.  She does not have h/o irregular heart beat  Assessment & Plan:   Principal Problem:   SBO (small bowel obstruction) (HCC) Active Problems:   HTN (hypertension)   Endometrioid adenocarcinoma of uterus (HCC)   Type 2 diabetes mellitus ([place])   SBO -Patient with admission for robot-assisted TAH due to h/o grade 1 endometrial CA; uncomplicated surgery and d/c 05/25/18 -She returned 12/26-29 with SBO -She presents today with 1-2 days of recurrent symptoms of acute onset of abdominal pain with n/v, abdominal distention, and CT findings c/w SBO -s/p diagnostic laparoscopy by surgery on 1/30 (see op note) -heart healthy diet per surgery -NG tube out now per surgery -IVF hydration -Plan for possible d/c tomorrow  Diet-controlled DM -Last A1c was  5.0 -SSI, sensitive -BG stable  HTN -BP continues to be elevated, received increased amlodipine at 10 mg today, fluctuating will continue to monitor -enalapril.  Continue amlodipine, clonidine, isosorbide mononitrate, metoprolol -Continue to adjust meds as needed -prn hydralazine  Gynecologic malignancy -Patient with admission for robot-assisted TAH due to h/o grade 1 endometrial CA; uncomplicated surgery and d/c 05/25/18 -Of note, she also had been noted to have Alexavier Tsutsui bladder mass in 6/19 and 12/19; cystoscopy was unrevealing (per previous notes) and she was recommended to have outpatient f/u -There is no such mention on CT today (rads also noted they were unable to see this), although there is ascites fluid noted -Discussed need for outpatient follow up with urology given above  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 in 12/19 and places patient at increased risk for GB CA development -Defer to surgery service, will need continued outpatient f/u for this  Deconditioning -Will need PT/OT post-operatively -Previously recommended for SNF and declined, will need possible placement  Irregular heart beat -Patient reportedly told by EMS she had new-onset afib -Will place on telemetry for 24 hours - sinus -Exam with regular rate, some ectopy -EKG with sinus rhythm -low suspicion for afib, will d/c tele today  (she does have risk factors for this with dilated LA, follow outpatient)  HFpEF: echo 1/30 with EF 65%, severe hypertrophy of basal septum, impaired relaxation diastolic filling patterns, elevated RVSP (see report) Holding lasix for now, appears euvolemic Continue BP meds as noted above  She'll need outpatient cardiology follow up  Elevated liver enzymes Improving, mild Follow, CT without concerning findings Follow hepatitis panel (negative) Follow outpatient  Hypokalemia: replace, follow  DVT prophylaxis: lovenox Code Status: DNR Family Communication: none at  bedside Disposition Plan: per surgery   Consultants:   Surgery is primary  Procedures: Echo IMPRESSIONS    1. The left ventricle has >65%. The cavity size is normal. There is Severe hypertrophy of the basal septum with otherwise mild concentric hypertrophy. Peak velocity 428 cm/s, peak gradient 73 mmHg at rest. left ventricular hypertrophy. Echo evidence of  impaired relaxation diastolic filling patterns. Elevated mean left atrial pressure.  2. There was evidence of Shylyn Younce mid cavitary LV graident. Peak velocity 428 cm/s, peak gradient 73 mmHg at rest.  3. Severely dilated left atrial size.  4. Normal tricuspid valve.  5. The aortic valve tricuspid. There is mild thickening and mild calcification of the aortic valve. Aortic valve regurgitation is mild by color flow Doppler. The calculated aortic valve area is 1.67 cm consistent with mild stenosis.  6. Normal right atrial size.  7. No atrial level shunt detected by color flow Doppler.  8. The right ventricle is in size. There is normal systolic function. Right ventricular systolic pressure is moderately elevated with an estimated pressure of 53.9 mmHg.  9. The aortic root and ascending aortaare normal is size and structure.  Antimicrobials:  Anti-infectives (From admission, onward)   Start     Dose/Rate Route Frequency Ordered Stop   08/26/18 0600  ciprofloxacin (CIPRO) IVPB 400 mg     400 mg 200 mL/hr over 60 Minutes Intravenous On call to O.R. 08/26/18 0107 08/26/18 1207     Subjective: Passing flatus. Feels constipated, had to small bowel movements, like balls Otherwise feeling better  Objective: Vitals:   08/28/18 1850 08/28/18 2142 08/29/18 0557 08/29/18 0849  BP: (!) 167/92 (!) 164/90 (!) 156/72 (!) 170/110  Pulse: 91 94 81 84  Resp: 20 19 18    Temp: 98.7 F (37.1 C) 98.6 F (37 C) 98.6 F (37 C)   TempSrc: Oral Oral Oral   SpO2: 100% 99% 96%   Weight:      Height:        Intake/Output Summary (Last 24 hours)  at 08/29/2018 1253 Last data filed at 08/29/2018 0400 Gross per 24 hour  Intake 1525.03 ml  Output -  Net 1525.03 ml   Filed Weights   08/25/18 1101  Weight: 85.3 kg    Examination:  General: No acute distress.  Eating melon. Cardiovascular: Heart sounds show Cyniah Gossard regular rate, and rhythm. Lungs: Clear to auscultation bilaterally  Abdomen: Soft, nontender, nondistended  Neurological: Alert and oriented 3. Moves all extremities 4. Cranial nerves II through XII grossly intact. Skin: Warm and dry. No rashes or lesions. Extremities: No clubbing or cyanosis. No edema.  Psychiatric: Mood and affect are normal. Insight and judgment are appropriate.    Data Reviewed: I have personally reviewed following labs and imaging studies  CBC: Recent Labs  Lab 08/25/18 1102 08/26/18 0325 08/27/18 0122 08/28/18 0302 08/29/18 0415  WBC 8.9 7.2 7.9 5.7 5.9  HGB 13.0 11.2* 11.0* 10.6* 11.1*  HCT 40.6 36.1 35.7* 33.0* 34.7*  MCV 81.2 80.9 81.3 80.7 80.7  PLT 254 241 243 263 478   Basic Metabolic Panel: Recent Labs  Lab 08/25/18 1102 08/26/18 0848 08/27/18 0122 08/28/18 0302 08/29/18 0415  NA 135 137 140 138 138  K 3.6 3.2* 3.7 3.1* 3.9  CL  97* 100 104 101 104  CO2 25 25 26 26 26   GLUCOSE 169* 96 90 97 104*  BUN 15 11 11 10  6*  CREATININE 0.72 0.73 0.74 0.67 0.68  CALCIUM 9.7 9.0 8.7* 8.8* 8.9  MG  --   --  1.7 1.7 1.7   GFR: Estimated Creatinine Clearance: 50.5 mL/min (by C-G formula based on SCr of 0.68 mg/dL). Liver Function Tests: Recent Labs  Lab 08/25/18 1102 08/26/18 0848 08/27/18 0122 08/28/18 0302  AST 87* 52* 45* 52*  ALT 63* 47* 38 37  ALKPHOS 194* 157* 144* 136*  BILITOT 1.0 1.1 1.0 0.7  PROT 7.2 6.2* 5.5* 5.5*  ALBUMIN 3.5 3.0* 2.6* 2.7*   Recent Labs  Lab 08/25/18 1102  LIPASE 19   No results for input(s): AMMONIA in the last 168 hours. Coagulation Profile: No results for input(s): INR, PROTIME in the last 168 hours. Cardiac Enzymes: No results for  input(s): CKTOTAL, CKMB, CKMBINDEX, TROPONINI in the last 168 hours. BNP (last 3 results) No results for input(s): PROBNP in the last 8760 hours. HbA1C: No results for input(s): HGBA1C in the last 72 hours. CBG: Recent Labs  Lab 08/28/18 1158 08/28/18 1713 08/28/18 2141 08/29/18 0750 08/29/18 1208  GLUCAP 116* 114* 117* 105* 141*   Lipid Profile: No results for input(s): CHOL, HDL, LDLCALC, TRIG, CHOLHDL, LDLDIRECT in the last 72 hours. Thyroid Function Tests: No results for input(s): TSH, T4TOTAL, FREET4, T3FREE, THYROIDAB in the last 72 hours. Anemia Panel: No results for input(s): VITAMINB12, FOLATE, FERRITIN, TIBC, IRON, RETICCTPCT in the last 72 hours. Sepsis Labs: No results for input(s): PROCALCITON, LATICACIDVEN in the last 168 hours.  Recent Results (from the past 240 hour(s))  Surgical pcr screen     Status: None   Collection Time: 08/25/18  7:04 PM  Result Value Ref Range Status   MRSA, PCR NEGATIVE NEGATIVE Final   Staphylococcus aureus NEGATIVE NEGATIVE Final    Comment: (NOTE) The Xpert SA Assay (FDA approved for NASAL specimens in patients 72 years of age and older), is one component of [facility] comprehensive surveillance program. It is not intended to diagnose infection nor to guide or monitor treatment. Performed at [facility], [place] [address] [place], [place] [address]          Radiology Studies: No results found.      Scheduled Meds: . amLODipine  10 mg Oral Daily  . cloNIDine  0.2 mg Oral BID  . cycloSPORINE  1 drop Both Eyes BID  . enalapril  20 mg Oral BID  . enoxaparin (LOVENOX) injection  40 mg Subcutaneous Q24H  . insulin aspart  0-5 Units Subcutaneous QHS  . insulin aspart  0-9 Units Subcutaneous TID WC  . isosorbide mononitrate  20 mg Oral Daily  . mouth rinse  15 mL Mouth Rinse BID  . metoprolol succinate  50 mg Oral Daily  . simvastatin  20 mg Oral QHS   Continuous Infusions: . lactated ringers Stopped (08/26/18 1314)      LOS: 4 days    Time spent: over 30 min    Fayrene Helper, MD [facility] Pager AMION  If 7PM-7AM, please contact night-coverage [email or web address] Password [facility] 08/29/2018, 12:53 PM

## 2018-08-29 NOTE — Progress Notes (Signed)
Patient ID: Brenda Alexander, female   DOB: 1932/08/05, 85 y.o.   MRN: [id number] [facility] Surgery Progress Note:   3 Days Post-Op  Subjective: Mental status is clear.  Up walking with her walker Objective: Vital signs in last 24 hours: Temp:  [98.6 F (37 C)-98.7 F (37.1 C)] 98.6 F (37 C) (02/02 0557) Pulse Rate:  [81-94] 81 (02/02 0557) Resp:  [16-20] 18 (02/02 0557) BP: (156-170)/(72-92) 156/72 (02/02 0557) SpO2:  [96 %-100 %] 96 % (02/02 0557)  Intake/Output from previous day: 02/01 0701 - 02/02 0700 In: 1645 [P.O.:820; I.V.:825] Out: 900 [Urine:900] Intake/Output this shift: No intake/output data recorded.  Physical Exam: Work of breathing is not labored;  + flatus and tolerating full liquids  Lab Results:  Results for orders placed or performed during the hospital encounter of 08/25/18 (from the past 48 hour(s))  Glucose, capillary     Status: None   Collection Time: 08/27/18 12:06 PM  Result Value Ref Range   Glucose-Capillary 86 70 - 99 mg/dL  Glucose, capillary     Status: None   Collection Time: 08/27/18  5:10 PM  Result Value Ref Range   Glucose-Capillary 76 70 - 99 mg/dL  Glucose, capillary     Status: None   Collection Time: 08/27/18  8:22 PM  Result Value Ref Range   Glucose-Capillary 89 70 - 99 mg/dL  Glucose, capillary     Status: None   Collection Time: 08/28/18 12:55 AM  Result Value Ref Range   Glucose-Capillary 93 70 - 99 mg/dL  CBC     Status: Abnormal   Collection Time: 08/28/18  3:02 AM  Result Value Ref Range   WBC 5.7 4.0 - 10.5 K/uL   RBC 4.09 3.87 - 5.11 MIL/uL   Hemoglobin 10.6 (L) 12.0 - 15.0 g/dL   HCT 33.0 (L) 36.0 - 46.0 %   MCV 80.7 80.0 - 100.0 fL   MCH 25.9 (L) 26.0 - 34.0 pg   MCHC 32.1 30.0 - 36.0 g/dL   RDW 16.5 (H) 11.5 - 15.5 %   Platelets 263 150 - 400 K/uL   nRBC 0.0 0.0 - 0.2 %    Comment: Performed at [facility], [place] [address], [place], [place] [address]  Comprehensive metabolic panel     Status: Abnormal    Collection Time: 08/28/18  3:02 AM  Result Value Ref Range   Sodium 138 135 - 145 mmol/L   Potassium 3.1 (L) 3.5 - 5.1 mmol/L   Chloride 101 98 - 111 mmol/L   CO2 26 22 - 32 mmol/L   Glucose, Bld 97 70 - 99 mg/dL   BUN 10 8 - 23 mg/dL   Creatinine, Ser 0.67 0.44 - 1.00 mg/dL   Calcium 8.8 (L) 8.9 - 10.3 mg/dL   Total Protein 5.5 (L) 6.5 - 8.1 g/dL   Albumin 2.7 (L) 3.5 - 5.0 g/dL   AST 52 (H) 15 - 41 U/L   ALT 37 0 - 44 U/L   Alkaline Phosphatase 136 (H) 38 - 126 U/L   Total Bilirubin 0.7 0.3 - 1.2 mg/dL   GFR calc non Af Amer >60 >60 mL/min   GFR calc Af Amer >60 >60 mL/min   Anion gap 11 5 - 15    Comment: Performed at [facility], [place] [address], [place], [place] [address]  Magnesium     Status: None   Collection Time: 08/28/18  3:02 AM  Result Value Ref Range  Magnesium 1.7 1.7 - 2.4 mg/dL    Comment: Performed at [facility], [place] [address], [place], [place] [address]  Glucose, capillary     Status: None   Collection Time: 08/28/18  4:21 AM  Result Value Ref Range   Glucose-Capillary 99 70 - 99 mg/dL  Glucose, capillary     Status: None   Collection Time: 08/28/18  8:21 AM  Result Value Ref Range   Glucose-Capillary 99 70 - 99 mg/dL  Glucose, capillary     Status: Abnormal   Collection Time: 08/28/18 11:58 AM  Result Value Ref Range   Glucose-Capillary 116 (H) 70 - 99 mg/dL  Glucose, capillary     Status: Abnormal   Collection Time: 08/28/18  5:13 PM  Result Value Ref Range   Glucose-Capillary 114 (H) 70 - 99 mg/dL  Glucose, capillary     Status: Abnormal   Collection Time: 08/28/18  9:41 PM  Result Value Ref Range   Glucose-Capillary 117 (H) 70 - 99 mg/dL  CBC     Status: Abnormal   Collection Time: 08/29/18  4:15 AM  Result Value Ref Range   WBC 5.9 4.0 - 10.5 K/uL   RBC 4.30 3.87 - 5.11 MIL/uL   Hemoglobin 11.1 (L) 12.0 - 15.0 g/dL   HCT 34.7 (L) 36.0 - 46.0 %   MCV 80.7 80.0 - 100.0 fL   MCH 25.8 (L) 26.0 - 34.0 pg   MCHC 32.0 30.0 -  36.0 g/dL   RDW 16.6 (H) 11.5 - 15.5 %   Platelets 286 150 - 400 K/uL   nRBC 0.0 0.0 - 0.2 %    Comment: Performed at [facility], [place]. [address] [place], [place] [address]  Basic metabolic panel     Status: Abnormal   Collection Time: 08/29/18  4:15 AM  Result Value Ref Range   Sodium 138 135 - 145 mmol/L   Potassium 3.9 3.5 - 5.1 mmol/L   Chloride 104 98 - 111 mmol/L   CO2 26 22 - 32 mmol/L   Glucose, Bld 104 (H) 70 - 99 mg/dL   BUN 6 (L) 8 - 23 mg/dL   Creatinine, Ser 0.68 0.44 - 1.00 mg/dL   Calcium 8.9 8.9 - 10.3 mg/dL   GFR calc non Af Amer >60 >60 mL/min   GFR calc Af Amer >60 >60 mL/min   Anion gap 8 5 - 15    Comment: Performed at [facility], [place] [address] [place], [place] [address]  Magnesium     Status: None   Collection Time: 08/29/18  4:15 AM  Result Value Ref Range   Magnesium 1.7 1.7 - 2.4 mg/dL    Comment: Performed at [place] [address] [place], [place] [address]  Glucose, capillary     Status: Abnormal   Collection Time: 08/29/18  7:50 AM  Result Value Ref Range   Glucose-Capillary 105 (H) 70 - 99 mg/dL    Radiology/Results: No results found.  Anti-infectives: Anti-infectives (From admission, onward)   Start     Dose/Rate Route Frequency Ordered Stop   08/26/18 0600  ciprofloxacin (CIPRO) IVPB 400 mg     400 mg 200 mL/hr over 60 Minutes Intravenous On call to O.R. 08/26/18 0107 08/26/18 1207      Assessment/Plan: Problem List: Patient Active Problem List   Diagnosis Date Noted  . SBO (small bowel obstruction) ([place]) 07/22/2018  . Leukocytosis 07/22/2018  . Hypokalemia 07/22/2018  .  Type 2 diabetes mellitus ([place]) 07/22/2018  . 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 Physical deconditioning 07/22/2018  . Endometrioid adenocarcinoma of uterus ([place]) 05/13/2018  . PMB (postmenopausal bleeding) 04/09/2018  . Bladder mass   . Symptomatic anemia 01/11/2018  . HTN (hypertension) 01/11/2018  . Coronary artery disease  01/11/2018    Advance to heart healthy diet; hopeful discharge Monday 3 Days Post-Op    LOS: 4 days   Matt B. Hassell Done, MD, [facility] [phone] beeper [phone]  08/29/2018 8:47 AM

## 2018-08-29 NOTE — Plan of Care (Signed)
  Problem: Clinical Measurements: Goal: Ability to maintain clinical measurements within normal limits will improve Outcome: Progressing Goal: Will remain free from infection Outcome: Progressing   Problem: Activity: Goal: Risk for activity intolerance will decrease Outcome: Progressing   Problem: Nutrition: Goal: Adequate nutrition will be maintained Outcome: Progressing   Problem: Pain Managment: Goal: General experience of comfort will improve Outcome: Progressing   Problem: Safety: Goal: Ability to remain free from injury will improve Outcome: Progressing   Problem: Skin Integrity: Goal: Risk for impaired skin integrity will decrease Outcome: Progressing   Problem: Clinical Measurements: Goal: Postoperative complications will be avoided or minimized Outcome: Progressing   Problem: Skin Integrity: Goal: Demonstration of wound healing without infection will improve Outcome: Progressing

## 2018-08-30 LAB — CBC
HCT: 35.2 % — ABNORMAL LOW (ref 36.0–46.0)
Hemoglobin: 10.6 g/dL — ABNORMAL LOW (ref 12.0–15.0)
MCH: 24.8 pg — ABNORMAL LOW (ref 26.0–34.0)
MCHC: 30.1 g/dL (ref 30.0–36.0)
MCV: 82.4 fL (ref 80.0–100.0)
Platelets: 298 10*3/uL (ref 150–400)
RBC: 4.27 MIL/uL (ref 3.87–5.11)
RDW: 16.6 % — ABNORMAL HIGH (ref 11.5–15.5)
WBC: 5.5 10*3/uL (ref 4.0–10.5)
nRBC: 0 % (ref 0.0–0.2)

## 2018-08-30 LAB — BASIC METABOLIC PANEL
Anion gap: 11 (ref 5–15)
BUN: 10 mg/dL (ref 8–23)
CO2: 27 mmol/L (ref 22–32)
Calcium: 8.9 mg/dL (ref 8.9–10.3)
Chloride: 101 mmol/L (ref 98–111)
Creatinine, Ser: 0.79 mg/dL (ref 0.44–1.00)
GFR calc Af Amer: >60 mL/min (ref >60)
GFR calc non Af Amer: >60 mL/min (ref >60)
Glucose, Bld: 107 mg/dL — ABNORMAL HIGH (ref 70–99)
Potassium: 4.2 mmol/L (ref 3.5–5.1)
Sodium: 139 mmol/L (ref 135–145)

## 2018-08-30 LAB — GLUCOSE, CAPILLARY: Glucose-Capillary: 96 mg/dL (ref 70–99)

## 2018-08-30 MED ORDER — OXYCODONE HCL 5 MG PO TABS: 5.0000 mg | ORAL_TABLET | Freq: Four times a day (QID) | ORAL | 0 refills | Status: DC | PRN

## 2018-08-30 MED ORDER — POLYETHYLENE GLYCOL 3350 17 G PO PACK: 17.0000 g | PACK | Freq: Every day | ORAL | 0 refills | Status: DC | PRN

## 2018-08-30 MED ORDER — AMLODIPINE BESYLATE 5 MG PO TABS: 10.0000 mg | ORAL_TABLET | Freq: Every day | ORAL | 0 refills | Status: DC

## 2018-08-30 NOTE — Plan of Care (Signed)
  Problem: Clinical Measurements: Goal: Ability to maintain clinical measurements within normal limits will improve Outcome: Progressing   Problem: Clinical Measurements: Goal: Will remain free from infection Outcome: Progressing   Problem: Activity: Goal: Risk for activity intolerance will decrease Outcome: Progressing   Problem: Nutrition: Goal: Adequate nutrition will be maintained Outcome: Progressing   Problem: Nutrition: Goal: Adequate nutrition will be maintained Outcome: Progressing   Problem: Pain Managment: Goal: General experience of comfort will improve Outcome: Adequate for Discharge

## 2018-08-30 NOTE — Progress Notes (Signed)
Consult NOTE    [name]  MLY:[id number] DOB: 09/23/1932 DOA: 08/25/2018 PCP: Nolene Ebbs, MD   Brief Narrative: Per HPI Brenda Alexander is Brenda Alexander 85 y.o. female with medical history significant of HTN; HLD; DM; and CAD presenting with abdominal pain.  She developed abdominal pain and n/v with anything she ate.  Symptoms started yesterday or maybe Monday.  These were the same symptoms as during last hospitalization.  She had Kullen Tomasetti TAH in October - she had been SOB, was found to be anemic, transfused, was losing blood vaginally.  Uterine biopsy with cancer.  Referred to Dr. Denman George and had Christna Kulick complete hysterectomy on 10/21.  She reports doing great but her daughter reports decreased PO, Nyja Westbrook little weaker, slow to progress.  She developed an SBO on 12/26, she had an NG tube placed, and she got better and was hospitalized for 3 days without need for intervention.  She remained weak but otherwise has done well.  She did slide out of bed Anaily Ashbaugh couple of times.  She was suggested to have SNF last time and she declined and family agreed, but now they understand that this may be needed.  Her BP has been running "high" since December.  She does not miss medication doses.  She does not have h/o irregular heart beat  Assessment & Plan:   Principal Problem:   SBO (small bowel obstruction) (HCC) Active Problems:   HTN (hypertension)   Endometrioid adenocarcinoma of uterus (HCC)   Type 2 diabetes mellitus ([place])   SBO -Patient with admission for robot-assisted TAH due to h/o grade 1 endometrial CA; uncomplicated surgery and d/c 05/25/18 -She returned 12/26-29 with SBO -She presents today with 1-2 days of recurrent symptoms of acute onset of abdominal pain with n/v, abdominal distention, and CT findings c/w SBO -s/p diagnostic laparoscopy by surgery on 1/30 (see op note) -heart healthy diet per surgery -NG tube out now per surgery -IVF hydration -Plan for d/c today per surg  Diet-controlled DM -Last A1c was  5.0 -SSI, sensitive -BG stable  HTN -amlodipine increased to 10 mg, discussed outpatient follow up with pt -enalapril.  Continue amlodipine, clonidine, isosorbide mononitrate, metoprolol -Continue to adjust meds as needed -prn hydralazine  Gynecologic malignancy -Patient with admission for robot-assisted TAH due to h/o grade 1 endometrial CA; uncomplicated surgery and d/c 05/25/18 -Of note, she also had been noted to have Ennis Heavner bladder mass in 6/19 and 12/19; cystoscopy was unrevealing (per previous notes) and she was recommended to have outpatient f/u -There is no such mention on CT today (rads also noted they were unable to see this), although there is ascites fluid noted -Discussed need for outpatient follow up with urology given above  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 in 12/19 and places patient at increased risk for GB CA development -Defer to surgery service, will need continued outpatient f/u for this  Deconditioning -Will need PT/OT post-operatively -Previously recommended for SNF and declined, will need possible placement  Irregular heart beat -Patient reportedly told by EMS she had new-onset afib -Will place on telemetry for 24 hours - sinus -Exam with regular rate, some ectopy -EKG with sinus rhythm -low suspicion for afib, will d/c tele today  (she does have risk factors for this with dilated LA, follow outpatient)  HFpEF: echo 1/30 with EF 65%, severe hypertrophy of basal septum, impaired relaxation diastolic filling patterns, elevated RVSP (see report) Holding lasix for now, appears euvolemic Continue BP meds as noted above She'll need outpatient cardiology follow  up.  Discussed with pt and daughter.  Elevated liver enzymes Improving, mild Follow, CT without concerning findings Follow hepatitis panel (negative) Follow outpatient.  Discussed with pt and daughter.  Hypokalemia: replace, follow  DVT prophylaxis: lovenox Code Status: DNR Family  Communication: none at bedside Disposition Plan: per surgery   Consultants:   Surgery is primary  Procedures: Echo IMPRESSIONS    1. The left ventricle has >65%. The cavity size is normal. There is Severe hypertrophy of the basal septum with otherwise mild concentric hypertrophy. Peak velocity 428 cm/s, peak gradient 73 mmHg at rest. left ventricular hypertrophy. Echo evidence of  impaired relaxation diastolic filling patterns. Elevated mean left atrial pressure.  2. There was evidence of Callen Vancuren mid cavitary LV graident. Peak velocity 428 cm/s, peak gradient 73 mmHg at rest.  3. Severely dilated left atrial size.  4. Normal tricuspid valve.  5. The aortic valve tricuspid. There is mild thickening and mild calcification of the aortic valve. Aortic valve regurgitation is mild by color flow Doppler. The calculated aortic valve area is 1.67 cm consistent with mild stenosis.  6. Normal right atrial size.  7. No atrial level shunt detected by color flow Doppler.  8. The right ventricle is in size. There is normal systolic function. Right ventricular systolic pressure is moderately elevated with an estimated pressure of 53.9 mmHg.  9. The aortic root and ascending aortaare normal is size and structure.  Antimicrobials:  Anti-infectives (From admission, onward)   Start     Dose/Rate Route Frequency Ordered Stop   08/26/18 0600  ciprofloxacin (CIPRO) IVPB 400 mg     400 mg 200 mL/hr over 60 Minutes Intravenous On call to O.R. 08/26/18 0107 08/26/18 1207     Subjective: Passing BM, flatus. Feels ready for d/c.  Objective: Vitals:   08/29/18 1303 08/29/18 1501 08/29/18 2011 08/30/18 0447  BP: 124/68 134/61 (!) 164/90 (!) 149/64  Pulse:  85 72 68  Resp:  17 18 16   Temp:  98.6 F (37 C) 98.3 F (36.8 C) 98.5 F (36.9 C)  TempSrc:  Oral Oral Oral  SpO2:  100% 98% 100%  Weight:      Height:        Intake/Output Summary (Last 24 hours) at 08/30/2018 2314 Last data filed at 08/30/2018  0945 Gross per 24 hour  Intake 420 ml  Output 500 ml  Net -80 ml   Filed Weights   08/25/18 1101  Weight: 85.3 kg    Examination:  General: No acute distress. Cardiovascular: Heart sounds show Cire Deyarmin regular rate, and rhythm. Lungs: Clear to auscultation bilaterally  Abdomen: Soft, nontender, nondistended  Neurological: Alert and oriented 3. Moves all extremities 4. Cranial nerves II through XII grossly intact. Skin: Warm and dry. No rashes or lesions. Extremities: No clubbing or cyanosis. No edema.      Data Reviewed: I have personally reviewed following labs and imaging studies  CBC: Recent Labs  Lab 08/26/18 0325 08/27/18 0122 08/28/18 0302 08/29/18 0415 08/30/18 0244  WBC 7.2 7.9 5.7 5.9 5.5  HGB 11.2* 11.0* 10.6* 11.1* 10.6*  HCT 36.1 35.7* 33.0* 34.7* 35.2*  MCV 80.9 81.3 80.7 80.7 82.4  PLT 241 243 263 286 341   Basic Metabolic Panel: Recent Labs  Lab 08/26/18 0848 08/27/18 0122 08/28/18 0302 08/29/18 0415 08/30/18 0244  NA 137 140 138 138 139  K 3.2* 3.7 3.1* 3.9 4.2  CL 100 104 101 104 101  CO2 25 26 26 26 27   GLUCOSE  96 90 97 104* 107*  BUN 11 11 10  6* 10  CREATININE 0.73 0.74 0.67 0.68 0.79  CALCIUM 9.0 8.7* 8.8* 8.9 8.9  MG  --  1.7 1.7 1.7  --    GFR: Estimated Creatinine Clearance: 50.5 mL/min (by C-G formula based on SCr of 0.79 mg/dL). Liver Function Tests: Recent Labs  Lab 08/25/18 1102 08/26/18 0848 08/27/18 0122 08/28/18 0302  AST 87* 52* 45* 52*  ALT 63* 47* 38 37  ALKPHOS 194* 157* 144* 136*  BILITOT 1.0 1.1 1.0 0.7  PROT 7.2 6.2* 5.5* 5.5*  ALBUMIN 3.5 3.0* 2.6* 2.7*   Recent Labs  Lab 08/25/18 1102  LIPASE 19   No results for input(s): AMMONIA in the last 168 hours. Coagulation Profile: No results for input(s): INR, PROTIME in the last 168 hours. Cardiac Enzymes: No results for input(s): CKTOTAL, CKMB, CKMBINDEX, TROPONINI in the last 168 hours. BNP (last 3 results) No results for input(s): PROBNP in the last  8760 hours. HbA1C: No results for input(s): HGBA1C in the last 72 hours. CBG: Recent Labs  Lab 08/29/18 0750 08/29/18 1208 08/29/18 1706 08/29/18 2017 08/30/18 0814  GLUCAP 105* 141* 85 116* 96   Lipid Profile: No results for input(s): CHOL, HDL, LDLCALC, TRIG, CHOLHDL, LDLDIRECT in the last 72 hours. Thyroid Function Tests: No results for input(s): TSH, T4TOTAL, FREET4, T3FREE, THYROIDAB in the last 72 hours. Anemia Panel: No results for input(s): VITAMINB12, FOLATE, FERRITIN, TIBC, IRON, RETICCTPCT in the last 72 hours. Sepsis Labs: No results for input(s): PROCALCITON, LATICACIDVEN in the last 168 hours.  Recent Results (from the past 240 hour(s))  Surgical pcr screen     Status: None   Collection Time: 08/25/18  7:04 PM  Result Value Ref Range Status   MRSA, PCR NEGATIVE NEGATIVE Final   Staphylococcus aureus NEGATIVE NEGATIVE Final    Comment: (NOTE) The Xpert SA Assay (FDA approved for NASAL specimens in patients 29 years of age and older), is one component of Elizabella Nolet comprehensive surveillance program. It is not intended to diagnose infection nor to guide or monitor treatment. Performed at [facility], [place] [address] [place], [place] [address]          Radiology Studies: No results found.      Scheduled Meds:  Continuous Infusions:    LOS: 5 days    Time spent: over 30 min    Fayrene Helper, MD [facility] Pager AMION  If 7PM-7AM, please contact night-coverage [email or web address] Password [email or web address] 08/30/2018, 11:14 PM

## 2018-08-30 NOTE — Progress Notes (Signed)
Pt discharged to home, home instructions given, no questions verbalized, alert and oriented prior to discharge

## 2018-08-30 NOTE — Discharge Summary (Signed)
Patient ID: Brenda Alexander [id number] 10-13-1932 85 y.o.  Admit date: 08/25/2018 Discharge date: 08/30/2018  Admitting Diagnosis: Recurrent Small Bowel Obstruction  Discharge Diagnosis Patient Active Problem List   Diagnosis Date Noted  . SBO (small bowel obstruction) ([place]) 07/22/2018  . Leukocytosis 07/22/2018  . Hypokalemia 07/22/2018  . Type 2 diabetes mellitus ([place]) 07/22/2018  . 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 Physical deconditioning 07/22/2018  . Endometrioid adenocarcinoma of uterus ([place]) 05/13/2018  . PMB (postmenopausal bleeding) 04/09/2018  . Bladder mass   . Symptomatic anemia 01/11/2018  . HTN (hypertension) 01/11/2018  . Coronary artery disease 01/11/2018    Consultants Medicine; PT/OT  Reason for Admission: Patient is a 85 year old female who [facility] to [facility] with abdominal pain since yesterday. Pain in epigastric region, characterized as cramping and intermittent, non-radiating. Reports associated nausea, vomiting today, last BM was last night which was not normal size for her. She denies flatus today but had some yesterday. Daughter at bedside who works on [employer] is requested the pt be placed on 4[employer]. Pt had a hysterectomy last year (05/24/18) for uterine cancer. No other abdominal surgeries. Pt was admitted on 07/22/18 for SBO. CT scan showed Single loop of edematous small bowel in pelvis with top-normal distension of proximal small bowel with air-fluid levels concerning for early partial recurrent small bowel obstruction, less likely ileus.  Procedures Diagnostic laparoscopy  Hospital Course:  The patient was admitted on 1/29 for recurrent SBO (previously admitted for SBO on 07/22/18). She underwent diagnostic laparoscopy on 1/30 by Dr. [name]. The patient tolerated the procedure well. On POD 4, the patient was tolerating a heart health diet, voiding well, passing flatus and pain was controlled with oral pain medications. She had a normal BM this AM.  Patient mobilizes at baseline with a walker. PT/OT evaluated the patient during her stay. Home health has been set up for discharge. The patient was stable for d/c home at this time and appropriate follow up was made.   Physical Exam: Gen: WD, WN, NAD Heart: RRR Lungs: CTA b/l Abd: Soft, ND, NT, incisions c/d/i. +BS Msk: No edema  Allergies as of 08/30/2018      Reactions   Other Anaphylaxis, Swelling   Walnuts = Throat swells   Penicillins Swelling, Rash   Has patient had a PCN reaction causing immediate rash, facial/tongue/throat swelling, SOB or lightheadedness with hypotension: Yes Has patient had a PCN reaction causing severe rash involving mucus membranes or skin necrosis: Unk Has patient had a PCN reaction that required hospitalization: Unk Has patient had a PCN reaction occurring within the last 10 years: No If all of the above answers are "NO", then may proceed with Cephalosporin use.      Medication List    TAKE these medications   acetaminophen 500 MG tablet Commonly known as:  TYLENOL Take 1,000 mg by mouth every 6 (six) hours as needed (for pain).   amLODipine 5 MG tablet Commonly known as:  NORVASC Take 5 mg by mouth daily.   CALCIUM + D3 PO Take 1 tablet by mouth daily.   cloNIDine 0.2 MG tablet Commonly known as:  CATAPRES Take 0.2 mg by mouth 2 (two) times daily.   cycloSPORINE 0.05 % ophthalmic emulsion Commonly known as:  RESTASIS Place 1 drop into both eyes 2 (two) times daily.   docusate sodium 100 MG capsule Commonly known as:  COLACE Take 1 capsule (100 mg total) by mouth 2 (two) times daily as needed for mild  constipation or moderate constipation.   enalapril 20 MG tablet Commonly known as:  VASOTEC Take 20 mg by mouth 2 (two) times daily.   ferrous sulfate 325 (65 FE) MG EC tablet Take 1 tablet (325 mg total) by mouth 2 (two) times daily.   furosemide 40 MG tablet Commonly known as:  LASIX Take 40 mg by mouth daily.   isosorbide  mononitrate 20 MG tablet Commonly known as:  ISMO,MONOKET Take 20 mg by mouth daily.   metoprolol succinate 50 MG 24 hr tablet Commonly known as:  TOPROL-XL Take 50 mg by mouth daily.   oxyCODONE 5 MG immediate release tablet Commonly known as:  Oxy IR/ROXICODONE Take 1 tablet (5 mg total) by mouth every 6 (six) hours as needed for moderate pain.   polyethylene glycol packet Commonly known as:  MIRALAX Take 17 g by mouth daily as needed for mild constipation.   potassium chloride SA 20 MEQ tablet Commonly known as:  K-DUR,KLOR-CON Take 20 mEq by mouth daily.   simvastatin 20 MG tablet Commonly known as:  ZOCOR Take 20 mg by mouth at bedtime.   vitamin C 500 MG tablet Commonly known as:  ASCORBIC ACID Take 500 mg by mouth daily.        Follow-up Information    [facility] Follow up.   Specialty:  Home Health Services Contact information: [place] [address] [phone]        [facility] Follow up on 09/09/2018.   Specialty:  General Surgery Why:  02/13 at 2:45 pm. Please arrive at your appointment 30 minutes early. Please bring a copy of your photo ID and insurance card.  Contact information: [place] [address] [phone]           Signed: Alferd Apa, [facility] 08/30/2018, 8:25 AM Pager: [phone]

## 2018-10-27 ENCOUNTER — Other Ambulatory Visit: Payer: Self-pay

## 2018-10-27 ENCOUNTER — Encounter: Payer: Self-pay | Admitting: Cardiology

## 2018-10-27 ENCOUNTER — Ambulatory Visit (INDEPENDENT_AMBULATORY_CARE_PROVIDER_SITE_OTHER): Payer: Medicare HMO | Admitting: Cardiology

## 2018-10-27 VITALS — BP 161/70 | HR 62 | Ht <= 58 in | Wt 182.0 lb

## 2018-10-27 DIAGNOSIS — I10 Essential (primary) hypertension: Secondary | ICD-10-CM | POA: Diagnosis not present

## 2018-10-27 DIAGNOSIS — R296 Repeated falls: Secondary | ICD-10-CM

## 2018-10-27 DIAGNOSIS — D649 Anemia, unspecified: Secondary | ICD-10-CM

## 2018-10-27 DIAGNOSIS — I5032 Chronic diastolic (congestive) heart failure: Secondary | ICD-10-CM | POA: Diagnosis not present

## 2018-10-27 MED ORDER — DILTIAZEM HCL ER COATED BEADS 120 MG PO CP24: 120.0000 mg | ORAL_CAPSULE | Freq: Every day | ORAL | 3 refills | Status: DC

## 2018-10-27 NOTE — Progress Notes (Signed)
Patient referred by Nolene Ebbs, MD for heart failure.   Subjective:   Dezirea Alexander, female    DOB: 05-Jun-1933, 85 y.o.   MRN: [id number]   Chief Complaint  Patient presents with  . New Patient (Initial Visit)  . Hypertension    HPI  85 year old African-American female with uncontrolled hypertension, type 2 diabetes mellitus, history of endometrioid adenocarcinoma of the uterus now status post hysterectomy in 2019, history of blood loss anemia, now referred for evaluation and management of heart failure.  Patient lives in an independent living.  She has had shortness of breath for last several months.  She was found to be profoundly anemic with hemoglobin 6-7 g/dL, back in June 2019.  This is led to further work-up and diagnosis of endometrioid adenocarcinoma of uterus for which she underwent successful hysterectomy in October 2019.  Shortness of breath has improved since then as her anemia has improved.  Her physical activity is limited to walking inside the house.  Her biggest complaint recently has been frequent falls, most recent 1 happened few weeks ago while trying to sit on the toilet bowl.  She reports getting dizzy prior to her fall.  She does not lose consciousness.  Otherwise, she denies having any presyncopal or syncopal symptoms.  She denies any chest pain.  She has had bilateral leg edema with no recent change in her symptoms.  Of note, patient had a small bowel obstruction secondary to adhesions from prior surgeries.  She underwent successful laparoscopic surgery for this in January 2020.  While hospitalized, she had an echocardiogram performed.  I personally reviewed this echocardiogram, details below.   Past Medical History:  Diagnosis Date  . Anemia   . Arthritis    knees, hands  . Coronary artery disease   . Diabetes mellitus without complication (HCC)    no meds  . Dyspnea 01/12/2018   w/anemia dx and hospital admit w/blood transfused   . Endometrioid  adenocarcinoma of uterus ([place]) 05/13/2018  . History of blood transfusion 01/12/2018   at [facility]  . Hyperlipemia   . Hypertension   . SVD (spontaneous vaginal delivery)    x 6 - 5 living children  . Uses walker    rollator  . Wears partial dentures    upper and lower partials     Past Surgical History:  Procedure Laterality Date  . COLONOSCOPY    . EYE SURGERY     cataracts removed right eye  . HYSTEROSCOPY W/D&C N/A 05/06/2018   Procedure: DILATATION AND CURETTAGE /HYSTEROSCOPY;  Surgeon: [name], MD;  Location: [facility];  Service: Gynecology;  Laterality: N/A;  . LAPAROSCOPY N/A 08/26/2018   Procedure: LAPAROSCOPY DIAGNOSTIC;  Surgeon: [name], MD;  Location: [place];  Service: General;  Laterality: N/A;  . MULTIPLE TOOTH EXTRACTIONS    . ROBOTIC ASSISTED TOTAL HYSTERECTOMY WITH BILATERAL SALPINGO OOPHERECTOMY Bilateral 05/24/2018   Procedure: XI ROBOTIC ASSISTED TOTAL HYSTERECTOMY WITH BILATERAL SALPINGO OOPHORECTOMY;  Surgeon: [name], MD;  Location: [name];  Service: Gynecology;  Laterality: Bilateral;  . SENTINEL NODE BIOPSY N/A 05/24/2018   Procedure: SENTINEL LYMP  NODE BIOPSY;  Surgeon: [name], MD;  Location: [name];  Service: Gynecology;  Laterality: N/A;     Social History   Socioeconomic History  . Marital status: Widowed    Spouse name: Not on file  . Number of children: 5  . Years of education: Not on file  . Highest education level: Not on file  Occupational History  . Occupation: retired  Scientific laboratory technician  . Financial resource strain: Not on file  . Food insecurity:    Worry: Not on file    Inability: Not on file  . Transportation needs:    Medical: Not on file    Non-medical: Not on file  Tobacco Use  . Smoking status: Never Smoker  . Smokeless tobacco: Never Used  Substance and Sexual Activity  . Alcohol use: Not Currently    Frequency: Never  . Drug use: Never  . Sexual activity: Not Currently    Partners: Male     Birth control/protection: Post-menopausal  Lifestyle  . Physical activity:    Days per week: Not on file    Minutes per session: Not on file  . Stress: Not on file  Relationships  . Social connections:    Talks on phone: Not on file    Gets together: Not on file    Attends religious service: Not on file    Active member of club or organization: Not on file    Attends meetings of clubs or organizations: Not on file    Relationship status: Not on file  . Intimate partner violence:    Fear of current or ex partner: Not on file    Emotionally abused: Not on file    Physically abused: Not on file    Forced sexual activity: Not on file  Other Topics Concern  . Not on file  Social History Narrative  . Not on file     Current Outpatient Medications on File Prior to Visit  Medication Sig Dispense Refill  . acetaminophen (TYLENOL) 500 MG tablet Take 1,000 mg by mouth every 6 (six) hours as needed (for pain).     . Calcium Carb-Cholecalciferol (CALCIUM + D3 PO) Take 1 tablet by mouth daily.    . cloNIDine (CATAPRES) 0.2 MG tablet Take 0.2 mg by mouth 2 (two) times daily.    . cycloSPORINE (RESTASIS) 0.05 % ophthalmic emulsion Place 1 drop into both eyes 2 (two) times daily.    Marland Kitchen docusate sodium (COLACE) 100 MG capsule Take 1 capsule (100 mg total) by mouth 2 (two) times daily as needed for mild constipation or moderate constipation. 30 capsule 2  . enalapril (VASOTEC) 20 MG tablet Take 20 mg by mouth 2 (two) times daily.  4  . ferrous sulfate 325 (65 FE) MG EC tablet Take 1 tablet (325 mg total) by mouth 2 (two) times daily. 60 tablet 3  . furosemide (LASIX) 40 MG tablet Take 40 mg by mouth daily.    . isosorbide mononitrate (ISMO,MONOKET) 20 MG tablet Take 20 mg by mouth daily.  4  . metoprolol succinate (TOPROL-XL) 50 MG 24 hr tablet Take 50 mg by mouth daily.  2  . oxyCODONE (OXY IR/ROXICODONE) 5 MG immediate release tablet Take 1 tablet (5 mg total) by mouth every 6 (six) hours as  needed for moderate pain. 10 tablet 0  . polyethylene glycol (MIRALAX) packet Take 17 g by mouth daily as needed for mild constipation. 14 each 0  . potassium chloride SA (K-DUR,KLOR-CON) 20 MEQ tablet Take 20 mEq by mouth daily.    . simvastatin (ZOCOR) 20 MG tablet Take 20 mg by mouth at bedtime.     . vitamin C (ASCORBIC ACID) 500 MG tablet Take 500 mg by mouth daily.     No current facility-administered medications on file prior to visit.     Cardiovascular studies:   Outside  EKG 10/13/2018:  Sinus rhythm tachycardia 100 bpm.  Left ventricular hypertrophy.  Otherwise normal EKG.  Echocardiogram 08/26/2018: Mild LVH. Hyperdynamic LV with apical/intracavitary peak gradient 73 mmHg. Grade 1 DD Severe LA dilatation Mild aortic stenosis RVSP 54 mmHg  Recent labs: Results for ADONIS, RYTHER (MRN [id number]) as of 10/27/2018 14:10  Ref. Range 08/30/2018 02:44  WBC Latest Ref Range: 4.0 - 10.5 K/uL 5.5  RBC Latest Ref Range: 3.87 - 5.11 MIL/uL 4.27  Hemoglobin Latest Ref Range: 12.0 - 15.0 g/dL 10.6 (L)  HCT Latest Ref Range: 36.0 - 46.0 % 35.2 (L)  MCV Latest Ref Range: 80.0 - 100.0 fL 82.4  MCH Latest Ref Range: 26.0 - 34.0 pg 24.8 (L)  MCHC Latest Ref Range: 30.0 - 36.0 g/dL 30.1  RDW Latest Ref Range: 11.5 - 15.5 % 16.6 (H)  Platelets Latest Ref Range: 150 - 400 K/uL 298  nRBC Latest Ref Range: 0.0 - 0.2 % 0.0   Results for FALLON, HAECKER (MRN [id number]) as of 10/27/2018 14:10  Ref. Range 08/30/2018 02:44  WBC Latest Ref Range: 4.0 - 10.5 K/uL 5.5  RBC Latest Ref Range: 3.87 - 5.11 MIL/uL 4.27  Hemoglobin Latest Ref Range: 12.0 - 15.0 g/dL 10.6 (L)  HCT Latest Ref Range: 36.0 - 46.0 % 35.2 (L)  MCV Latest Ref Range: 80.0 - 100.0 fL 82.4  MCH Latest Ref Range: 26.0 - 34.0 pg 24.8 (L)  MCHC Latest Ref Range: 30.0 - 36.0 g/dL 30.1  RDW Latest Ref Range: 11.5 - 15.5 % 16.6 (H)  Platelets Latest Ref Range: 150 - 400 K/uL 298  nRBC Latest Ref Range: 0.0 - 0.2 % 0.0    Review of Systems   Constitution: Negative for decreased appetite, malaise/fatigue, weight gain and weight loss.  HENT: Negative for congestion.   Eyes: Negative for visual disturbance.  Cardiovascular: Positive for dyspnea on exertion (Stable) and leg swelling (Stable). Negative for chest pain, palpitations and syncope.  Respiratory: Negative for shortness of breath.   Endocrine: Negative for cold intolerance.  Hematologic/Lymphatic: Does not bruise/bleed easily.  Skin: Negative for itching and rash.  Musculoskeletal: Negative for myalgias.  Gastrointestinal: Negative for abdominal pain, nausea and vomiting.  Genitourinary: Negative for dysuria.  Neurological: Negative for dizziness (One recent episode) and weakness.  Psychiatric/Behavioral: The patient is not nervous/anxious.   All other systems reviewed and are negative.        Vitals:   10/27/18 1229  BP: (!) 161/70  Pulse: 62  SpO2: 97%    Objective:   Physical Exam  Constitutional: She is oriented to person, place, and time. She appears well-developed and well-nourished. No distress.  HENT:  Head: Normocephalic and atraumatic.  Eyes: Pupils are equal, round, and reactive to light. Conjunctivae are normal.  Neck: No JVD present.  Cardiovascular: Normal rate, regular rhythm and intact distal pulses.  Murmur heard.  Medium-pitched harsh midsystolic murmur is present at the upper right sternal border radiating to the neck. Pulmonary/Chest: Effort normal and breath sounds normal. She has no wheezes. She has no rales.  Abdominal: Soft. Bowel sounds are normal. There is no rebound.  Musculoskeletal:        General: Edema (1+ b/l) present.  Lymphadenopathy:    She has no cervical adenopathy.  Neurological: She is alert and oriented to person, place, and time. No cranial nerve deficit.  Skin: Skin is warm and dry.  Psychiatric: She has a normal mood and affect.  Nursing note and vitals reviewed.  Assessment & Recommendations:    85 year old African-American female with uncontrolled hypertension, type 2 diabetes mellitus, history of endometrioid adenocarcinoma of the uterus now status post hysterectomy in 2019, history of blood loss anemia, now referred for evaluation and management of heart failure.  1. Essential hypertension Suboptimal control.  See below.  2. Chronic heart failure with preserved ejection fraction ([place]) I personally reviewed patient's echocardiogram performed in January 2020 during her admission with small bowel obstruction.  Patient was noted to be in sinus tachycardia rate 120 bpm then.  Echocardiogram performed in the setting showed hyperdynamic LV with apical and intracavitary peak gradient of 73 mmHg.  There is mild LVH, EF greater than 96%, grade 1 diastolic dysfunction, severe left atrial dilatation.  I suspect patient's half-life is emanating from hyperdynamic LV causing intracavitary obstruction.  There is no echocardiographic evidence of hypertrophic cardiomyopathy.  There is no evidence of significant aortic stenosis.  Her recent episode of dizziness while sitting on toilet bowl may also have been an episode of Valsalva maneuver exacerbating intracavitary obstruction.  She will benefit from cautious reduction in her resting heart rate.  Continue metoprolol succinate 50 mg daily.  I have stopped her amlodipine 10 mg daily, and started on diltiazem 140 mg once daily.  During next few days/weeks, if her blood pressure stays above 160/90 mmHg, I have asked her to increase clonidine to 0.2 mg 3 times daily.  3. Anemia, unspecified type I suspect that this is also contributing to patient's hyperdynamic LV.  Recommend follow-up of anemia with PCP.  4.  Frequent falls: She will benefit from occupational and physical therapy, as well as consideration for home health.  During current COVID outbreak, this may have limitations.  However, I will inform her PCP to follow-up regarding the same.    Thank you  for referring the patient to [place]. Please feel free to contact with any questions.  Nigel Mormon, MD [facility] Pager: [phone] Office: [phone] If no answer Cell [phone]

## 2018-11-24 ENCOUNTER — Telehealth: Payer: Self-pay | Admitting: *Deleted

## 2018-11-24 NOTE — Telephone Encounter (Signed)
Called and spoke with the patient regarding her appt for 5/6. Patient ask to have the appt moved to June

## 2018-11-25 ENCOUNTER — Encounter: Payer: Self-pay | Admitting: Cardiology

## 2018-11-25 ENCOUNTER — Ambulatory Visit (INDEPENDENT_AMBULATORY_CARE_PROVIDER_SITE_OTHER): Payer: Medicare HMO | Admitting: Cardiology

## 2018-11-25 ENCOUNTER — Other Ambulatory Visit: Payer: Self-pay

## 2018-11-25 VITALS — BP 155/80 | HR 72 | Ht 62.0 in | Wt 181.1 lb

## 2018-11-25 DIAGNOSIS — I5032 Chronic diastolic (congestive) heart failure: Secondary | ICD-10-CM

## 2018-11-25 DIAGNOSIS — I10 Essential (primary) hypertension: Secondary | ICD-10-CM | POA: Diagnosis not present

## 2018-11-25 NOTE — Progress Notes (Signed)
Patient referred by Nolene Ebbs, MD for heart failure.   Subjective:   Brenda Alexander, female    DOB: 1933-06-10, 85 y.o.   MRN: [id number]   I connected with the patient on 04/0/2020 by a video enabled telemedicine application and verified that I am speaking with the correct person using two identifiers.     I discussed the limitations of evaluation and management by telemedicine and the availability of in person appointments. The patient expressed understanding and agreed to proceed.   This visit type was conducted due to national recommendations for restrictions regarding the COVID-19 Pandemic (e.g. social distancing).  This format is felt to be most appropriate for this patient at this time.  All issues noted in this document were discussed and addressed.  No physical exam was performed (except for noted visual exam findings with Tele health visits).  The patient has consented to conduct a Tele health visit and understands insurance will be billed.   HPI  85 year old African-American female with uncontrolled hypertension, type 2 diabetes mellitus, history of endometrioid adenocarcinoma of the uterus now status post hysterectomy in 2019, history of blood loss anemia, now referred for evaluation and management of heart failure.  I personally reviewed patient's echocardiogram performed in January 2020 during her admission with small bowel obstruction.  Patient was noted to be in sinus tachycardia rate 120 bpm then.  Echocardiogram performed in the setting showed hyperdynamic LV with apical and intracavitary peak gradient of 73 mmHg.  There is mild LVH, EF greater than 30%, grade 1 diastolic dysfunction, severe left atrial dilatation.  I suspected that hyperdynamic LV is causing intracavitary obstruction.  There is no echocardiographic evidence of hypertrophic cardiomyopathy.  There is no evidence of significant aortic stenosis.  Her recent episode of dizziness while sitting on toilet bowl may  also have been an episode of Valsalva maneuver exacerbating intracavitary obstruction. I felt that she would benefit from cautious reduction in her resting heart rate.  I continued her metoprolol succinate 50 mg daily.  I stopped her amlodipine 10 mg daily, and started her on diltiazem 120 mg once daily.  If her blood pressure stays above 160/90 mmHg, I asked her to increase clonidine to 0.2 mg 3 times daily.  This is her 4 week follow up visit. She has not had any presyncope/syncope episodes since last visit. BP is usually well controlled. Leg edema has reduced. She is compliant with her medical therapy. She has not had any falls.  Past Medical History:  Diagnosis Date   Anemia    Arthritis    knees, hands   Coronary artery disease    Diabetes mellitus without complication ([place])    no meds   Dyspnea 01/12/2018   w/anemia dx and hospital admit w/blood transfused    Endometrioid adenocarcinoma of uterus ([place]) 05/13/2018   History of blood transfusion 01/12/2018   at [facility]   Hyperlipemia    Hypertension    SVD (spontaneous vaginal delivery)    x 6 - 5 living children   Uses walker    rollator   Wears partial dentures    upper and lower partials     Past Surgical History:  Procedure Laterality Date   COLONOSCOPY     EYE SURGERY     cataracts removed right eye   HYSTEROSCOPY W/D&C N/A 05/06/2018   Procedure: DILATATION AND CURETTAGE /HYSTEROSCOPY;  Surgeon: [name], MD;  Location: [facility];  Service: Gynecology;  Laterality: N/A;   LAPAROSCOPY  N/A 08/26/2018   Procedure: LAPAROSCOPY DIAGNOSTIC;  Surgeon: [name], MD;  Location: [facility];  Service: General;  Laterality: N/A;   MULTIPLE TOOTH EXTRACTIONS     ROBOTIC ASSISTED TOTAL HYSTERECTOMY WITH BILATERAL SALPINGO OOPHERECTOMY Bilateral 05/24/2018   Procedure: XI ROBOTIC ASSISTED TOTAL HYSTERECTOMY WITH BILATERAL SALPINGO OOPHORECTOMY;  Surgeon: [name], MD;  Location: [name];  Service:  Gynecology;  Laterality: Bilateral;   SENTINEL NODE BIOPSY N/A 05/24/2018   Procedure: SENTINEL LYMP  NODE BIOPSY;  Surgeon: [name], MD;  Location: [name];  Service: Gynecology;  Laterality: N/A;     Social History   Socioeconomic History   Marital status: Widowed    Spouse name: Not on file   Number of children: 5   Years of education: Not on file   Highest education level: Not on file  Occupational History   Occupation: retired  Scientist, product/process development strain: Not on file   Food insecurity:    Worry: Not on file    Inability: Not on Lexicographer needs:    Medical: Not on file    Non-medical: Not on file  Tobacco Use   Smoking status: Never Smoker   Smokeless tobacco: Never Used  Substance and Sexual Activity   Alcohol use: Not Currently    Frequency: Never   Drug use: Never   Sexual activity: Not Currently    Partners: Male    Birth control/protection: Post-menopausal  Lifestyle   Physical activity:    Days per week: Not on file    Minutes per session: Not on file   Stress: Not on file  Relationships   Social connections:    Talks on phone: Not on file    Gets together: Not on file    Attends religious service: Not on file    Active member of club or organization: Not on file    Attends meetings of clubs or organizations: Not on file    Relationship status: Not on file   Intimate partner violence:    Fear of current or ex partner: Not on file    Emotionally abused: Not on file    Physically abused: Not on file    Forced sexual activity: Not on file  Other Topics Concern   Not on file  Social History Narrative   Not on file     Current Outpatient Medications on File Prior to Visit  Medication Sig Dispense Refill   acetaminophen (TYLENOL) 500 MG tablet Take 1,000 mg by mouth every 6 (six) hours as needed (for pain).      Calcium Carb-Cholecalciferol (CALCIUM + D3 PO) Take 1 tablet by mouth daily.      cloNIDine (CATAPRES) 0.2 MG tablet Take 0.2 mg by mouth 2 (two) times daily.     cycloSPORINE (RESTASIS) 0.05 % ophthalmic emulsion Place 1 drop into both eyes 2 (two) times daily.     diltiazem (CARDIZEM CD) 120 MG 24 hr capsule Take 1 capsule (120 mg total) by mouth daily. 60 capsule 3   docusate sodium (COLACE) 100 MG capsule Take 1 capsule (100 mg total) by mouth 2 (two) times daily as needed for mild constipation or moderate constipation. 30 capsule 2   enalapril (VASOTEC) 20 MG tablet Take 20 mg by mouth 2 (two) times daily.  4   ferrous sulfate 325 (65 FE) MG EC tablet Take 1 tablet (325 mg total) by mouth 2 (two) times daily. 60 tablet 3   furosemide (LASIX)  40 MG tablet Take 40 mg by mouth daily.     isosorbide mononitrate (ISMO,MONOKET) 20 MG tablet Take 20 mg by mouth daily.  4   metoprolol succinate (TOPROL-XL) 50 MG 24 hr tablet Take 50 mg by mouth daily.  2   oxyCODONE (OXY IR/ROXICODONE) 5 MG immediate release tablet Take 1 tablet (5 mg total) by mouth every 6 (six) hours as needed for moderate pain. 10 tablet 0   polyethylene glycol (MIRALAX) packet Take 17 g by mouth daily as needed for mild constipation. 14 each 0   potassium chloride SA (K-DUR,KLOR-CON) 20 MEQ tablet Take 20 mEq by mouth daily.     simvastatin (ZOCOR) 20 MG tablet Take 20 mg by mouth at bedtime.      vitamin C (ASCORBIC ACID) 500 MG tablet Take 500 mg by mouth daily.     No current facility-administered medications on file prior to visit.     Cardiovascular studies:   Outside EKG 10/13/2018:  Sinus rhythm tachycardia 100 bpm.  Left ventricular hypertrophy.  Otherwise normal EKG.  Echocardiogram 08/26/2018: Mild LVH. Hyperdynamic LV with apical/intracavitary peak gradient 73 mmHg. Grade 1 DD Severe LA dilatation Mild aortic stenosis RVSP 54 mmHg  Recent labs: Results for MELA, PERHAM (MRN [id number]) as of 10/27/2018 14:10  Ref. Range 08/30/2018 02:44  WBC Latest Ref Range: 4.0 - 10.5 K/uL  5.5  RBC Latest Ref Range: 3.87 - 5.11 MIL/uL 4.27  Hemoglobin Latest Ref Range: 12.0 - 15.0 g/dL 10.6 (L)  HCT Latest Ref Range: 36.0 - 46.0 % 35.2 (L)  MCV Latest Ref Range: 80.0 - 100.0 fL 82.4  MCH Latest Ref Range: 26.0 - 34.0 pg 24.8 (L)  MCHC Latest Ref Range: 30.0 - 36.0 g/dL 30.1  RDW Latest Ref Range: 11.5 - 15.5 % 16.6 (H)  Platelets Latest Ref Range: 150 - 400 K/uL 298  nRBC Latest Ref Range: 0.0 - 0.2 % 0.0   Results for GENIENE, LIST (MRN [id number]) as of 10/27/2018 14:10  Ref. Range 08/30/2018 02:44  WBC Latest Ref Range: 4.0 - 10.5 K/uL 5.5  RBC Latest Ref Range: 3.87 - 5.11 MIL/uL 4.27  Hemoglobin Latest Ref Range: 12.0 - 15.0 g/dL 10.6 (L)  HCT Latest Ref Range: 36.0 - 46.0 % 35.2 (L)  MCV Latest Ref Range: 80.0 - 100.0 fL 82.4  MCH Latest Ref Range: 26.0 - 34.0 pg 24.8 (L)  MCHC Latest Ref Range: 30.0 - 36.0 g/dL 30.1  RDW Latest Ref Range: 11.5 - 15.5 % 16.6 (H)  Platelets Latest Ref Range: 150 - 400 K/uL 298  nRBC Latest Ref Range: 0.0 - 0.2 % 0.0    Review of Systems  Constitution: Negative for decreased appetite, malaise/fatigue, weight gain and weight loss.  HENT: Negative for congestion.   Eyes: Negative for visual disturbance.  Cardiovascular: Positive for dyspnea on exertion (Stable) and leg swelling (Stable). Negative for chest pain, palpitations and syncope.  Respiratory: Negative for shortness of breath.   Endocrine: Negative for cold intolerance.  Hematologic/Lymphatic: Does not bruise/bleed easily.  Skin: Negative for itching and rash.  Musculoskeletal: Negative for myalgias.  Gastrointestinal: Negative for abdominal pain, nausea and vomiting.  Genitourinary: Negative for dysuria.  Neurological: Negative for dizziness (One recent episode) and weakness.  Psychiatric/Behavioral: The patient is not nervous/anxious.   All other systems reviewed and are negative.        Vitals:   11/25/18 1007  BP: (!) 155/80  Pulse: 72    Objective:    Physical Exam  Constitutional: She is oriented to person, place, and time. She appears well-developed and well-nourished. No distress.  HENT:  Head: Normocephalic.  Pulmonary/Chest: Effort normal.  Musculoskeletal:        General: No edema.  Neurological: She is alert and oriented to person, place, and time.  Psychiatric: She has a normal mood and affect.  Nursing note and vitals reviewed.         Assessment & Recommendations:   85 year old African-American female with uncontrolled hypertension, type 2 diabetes mellitus, history of endometrioid adenocarcinoma of the uterus now status post hysterectomy in 2019, history of blood loss anemia, now referred for evaluation and management of heart failure.   1. Essential hypertension Suboptimal, but usually lower than today's visit. No changes made to her regimen today.  2. Chronic heart failure with preserved ejection fraction (HCC) Likely diastolic dysfunction with hyperdynamic LV and intracavitary obstruction. No syncope since being on metoprolol and diltiazem. Continue the same. Okay to use lasix 40 mg daily.  3. Anemia, unspecified type I suspect that this is also contributing to patient's hyperdynamic LV.  Recommend follow-up of anemia with PCP.  F/u in 02/2019.  Nigel Mormon, MD [facility] Pager: [phone] Office: [phone] If no answer Cell [phone]

## 2018-11-29 ENCOUNTER — Telehealth: Payer: Self-pay

## 2018-11-29 NOTE — Telephone Encounter (Signed)
Pt says that you stopped her ASA and lasix; Didn't see it in last OV can you confirm

## 2018-11-29 NOTE — Telephone Encounter (Signed)
Ok to stop Aspirin. Should continue lasix 40 mg daily, or at least, as needed for leg edema.  Thanks MJP

## 2018-12-01 ENCOUNTER — Ambulatory Visit: Payer: Medicare HMO | Admitting: Gynecologic Oncology

## 2019-01-10 ENCOUNTER — Other Ambulatory Visit: Payer: Self-pay

## 2019-01-10 ENCOUNTER — Inpatient Hospital Stay: Payer: Medicare HMO | Attending: Gynecologic Oncology | Admitting: Gynecologic Oncology

## 2019-01-10 ENCOUNTER — Encounter: Payer: Self-pay | Admitting: Gynecologic Oncology

## 2019-01-10 VITALS — BP 175/68 | HR 73 | Temp 98.5°F | Resp 18 | Ht 62.0 in | Wt 170.4 lb

## 2019-01-10 DIAGNOSIS — Z90722 Acquired absence of ovaries, bilateral: Secondary | ICD-10-CM | POA: Diagnosis not present

## 2019-01-10 DIAGNOSIS — C541 Malignant neoplasm of endometrium: Secondary | ICD-10-CM | POA: Diagnosis present

## 2019-01-10 DIAGNOSIS — I10 Essential (primary) hypertension: Secondary | ICD-10-CM | POA: Insufficient documentation

## 2019-01-10 DIAGNOSIS — E785 Hyperlipidemia, unspecified: Secondary | ICD-10-CM | POA: Insufficient documentation

## 2019-01-10 DIAGNOSIS — Z90710 Acquired absence of both cervix and uterus: Secondary | ICD-10-CM

## 2019-01-10 DIAGNOSIS — M1990 Unspecified osteoarthritis, unspecified site: Secondary | ICD-10-CM | POA: Diagnosis not present

## 2019-01-10 DIAGNOSIS — E119 Type 2 diabetes mellitus without complications: Secondary | ICD-10-CM | POA: Insufficient documentation

## 2019-01-10 DIAGNOSIS — I2510 Atherosclerotic heart disease of native coronary artery without angina pectoris: Secondary | ICD-10-CM | POA: Diagnosis not present

## 2019-01-10 DIAGNOSIS — C55 Malignant neoplasm of uterus, part unspecified: Secondary | ICD-10-CM

## 2019-01-10 NOTE — Patient Instructions (Signed)
Please notify Dr Denman George at phone number [phone] if you notice vaginal bleeding, new pelvic or abdominal pains, bloating, feeling full easy, or a change in bladder or bowel function.   Please contact Dr Serita Grit office (at [phone]) in January, 2021 to request an appointment with her for June, 2021.  You should return to see Dr Penny Pia in December, 2020 for a wellness visit with her.

## 2019-01-10 NOTE — Progress Notes (Signed)
Follow-up Note: Gyn-Onc  Consult was requested by Dr. Harolyn Rutherford for the evaluation of Brenda Alexander 85 y.o. female  CC:  Chief Complaint  Patient presents with  . Endometrioid adenocarcinoma of uterus [facility])    Assessment/Plan:  [name]  is a 85 y.o.  year old with stage IA grade 1 endometrial adenocarcinoma s/p hysterectomy, BSO, SLN biopsy on 05/24/18. MMR normal, MSI stable.  Pathology revealed low risk factors for recurrence, therefore no adjuvant therapy is recommended according to NCCN guidelines.  I discussed risk for recurrence and typical symptoms encouraged her to notify us of these should they develop between visits.  I recommend she continue to have follow-up every 6 months for 5 years (until October, 2024) in accordance with NCCN guidelines. Those visits should include symptom assessment, physical exam and pelvic examination. Pap smears are not indicated or recommended in the routine surveillance of endometrial cancer.  She will return to see me in June, 2021 and Dr Harolyn Rutherford in December, 2020.   HPI: Ms Brenda Alexander is a 85 year old P5 who is seen in consultation at the request of Dr Harolyn Rutherford for grade 1 endometrial cancer.  Patient reports light postmenopausal bleeding since the summer 2019.  Initially it was felt that this was hematuria and she was seen with a CT scan on January 12, 2018 which revealed a market enlargement of the endometrial canal with a 3.2 cm thickness.  There were no adnexal masses.  A 2.0 x 3.4 cm focal area of the bladder wall thickening was identified on the CT scan.  She then saw Dr. Lovena Neighbours, urologist, who performed cystoscopy, which per patient was unremarkable and did not show any lesion within the bladder.  She was seen by her gynecologist, Dr.Anyanwu, who performed ultrasound imaging on January 21, 2018 which revealed a uterus measuring 9.6 x 5.1 x 6.4 cm with no fibroids or other masses.  The endometrium was very thick at 3.1 cm.  Had a heterogeneous  appearance.  The ovaries bilaterally were normal.  Of note a Pap smear was normal with negative high-risk HPV in June or 2019.  Ca1 25 was drawn in June 2019 which was also normal 16.  And CEA was normal at 1.3.  The patient underwent a D&C procedure on May 06, 2018 with endometrial pathology revealing FIGO grade 1 endometrial adenocarcinoma.  The patient is somewhat frail and uses a walker for reasons of arthritis.  However she does not have many discrete medical comorbidities.  She carries a diagnosis in her chart of coronary artery disease, however the patient has no chest pain on exertion or cardiac symptoms.  She has no history of a myocardial infarction or angina.  She is never had a coronary perfusion study or catheterization procedure.  She is never had a CVA or other sequelae of peripheral vascular disease.  Patient has history of diabetes mellitus for which she does not take medication as is no longer needed.  She does have hyperlipidemia and hypertension.  She has had 5 prior vaginal deliveries.  She has had no prior abdominal surgeries.   On May 16, 2018 she underwent a robotic assisted total hysterectomy with BSO and sentinel lymph node biopsy.  Intraoperative findings were significant for a 10 cm slightly bulky fibroid uterus with a grossly normal bladder including at the posterior base and trigone, normal tubes and ovaries, no suspicious nodes.  Surgery was uncomplicated as was her postop recovery.  Postoperative pathology revealed a FIGO grade 1 endometrioid  endometrial adenocarcinoma with minimal myometrial invasion (less than 50% myometrial invasion identified, no cervical stromal involvement and no LVSI present.  Sentinel lymph nodes adnexa and cervix were negative.  The tumor was negative for MMR and MSI testing.  She was deemed to have a low risk tumor for recurrence and therefore no adjuvant therapy was recommended in accordance with NCCN guidelines.  Interval Hx:  She  has had a 30 lb weight loss of over 6 months (unexplained). She has also been falling. She saw her PCP to discuss this further and some medications were changed.   She denies symptoms related to cancer recurrence. We discussed survivorship issues including long-term sequelae/toxicity from surgery.   Current Meds:  Outpatient Encounter Medications as of 01/10/2019  Medication Sig  . acetaminophen (TYLENOL) 500 MG tablet Take 1,000 mg by mouth every 6 (six) hours as needed (for pain).   . Calcium Carb-Cholecalciferol (CALCIUM + D3 PO) Take 1 tablet by mouth daily.  . cloNIDine (CATAPRES) 0.2 MG tablet Take 0.2 mg by mouth 2 (two) times daily.  . cycloSPORINE (RESTASIS) 0.05 % ophthalmic emulsion Place 1 drop into both eyes 2 (two) times daily.  Marland Kitchen diltiazem (CARDIZEM CD) 120 MG 24 hr capsule Take 1 capsule (120 mg total) by mouth daily.  Marland Kitchen docusate sodium (COLACE) 100 MG capsule Take 1 capsule (100 mg total) by mouth 2 (two) times daily as needed for mild constipation or moderate constipation.  . enalapril (VASOTEC) 20 MG tablet Take 20 mg by mouth 2 (two) times daily.  . ferrous sulfate 325 (65 FE) MG EC tablet Take 1 tablet (325 mg total) by mouth 2 (two) times daily.  . furosemide (LASIX) 40 MG tablet Take 40 mg by mouth daily.  . isosorbide mononitrate (ISMO,MONOKET) 20 MG tablet Take 20 mg by mouth daily.  . metoprolol succinate (TOPROL-XL) 50 MG 24 hr tablet Take 50 mg by mouth daily.  Marland Kitchen oxyCODONE (OXY IR/ROXICODONE) 5 MG immediate release tablet Take 1 tablet (5 mg total) by mouth every 6 (six) hours as needed for moderate pain.  . polyethylene glycol (MIRALAX) packet Take 17 g by mouth daily as needed for mild constipation.  . potassium chloride SA (K-DUR,KLOR-CON) 20 MEQ tablet Take 20 mEq by mouth daily.  . simvastatin (ZOCOR) 20 MG tablet Take 20 mg by mouth at bedtime.   . vitamin C (ASCORBIC ACID) 500 MG tablet Take 500 mg by mouth daily.   No facility-administered encounter  medications on file as of 01/10/2019.     Allergy:  Allergies  Allergen Reactions  . Other Anaphylaxis and Swelling    Walnuts = Throat swells  . Penicillins Swelling and Rash    Has patient had a PCN reaction causing immediate rash, facial/tongue/throat swelling, SOB or lightheadedness with hypotension: Yes Has patient had a PCN reaction causing severe rash involving mucus membranes or skin necrosis: Unk Has patient had a PCN reaction that required hospitalization: Unk Has patient had a PCN reaction occurring within the last 10 years: No If all of the above answers are "NO", then may proceed with Cephalosporin use.     Social Hx:   Social History   Socioeconomic History  . Marital status: Widowed    Spouse name: Not on file  . Number of children: 5  . Years of education: Not on file  . Highest education level: Not on file  Occupational History  . Occupation: retired  Scientific laboratory technician  . Financial resource strain: Not on file  .  Food insecurity    Worry: Not on file    Inability: Not on file  . Transportation needs    Medical: Not on file    Non-medical: Not on file  Tobacco Use  . Smoking status: Never Smoker  . Smokeless tobacco: Never Used  Substance and Sexual Activity  . Alcohol use: Not Currently    Frequency: Never  . Drug use: Never  . Sexual activity: Not Currently    Partners: Male    Birth control/protection: Post-menopausal  Lifestyle  . Physical activity    Days per week: Not on file    Minutes per session: Not on file  . Stress: Not on file  Relationships  . Social Herbalist on phone: Not on file    Gets together: Not on file    Attends religious service: Not on file    Active member of club or organization: Not on file    Attends meetings of clubs or organizations: Not on file    Relationship status: Not on file  . Intimate partner violence    Fear of current or ex partner: Not on file    Emotionally abused: Not on file    Physically  abused: Not on file    Forced sexual activity: Not on file  Other Topics Concern  . Not on file  Social History Narrative  . Not on file    Past Surgical Hx:  Past Surgical History:  Procedure Laterality Date  . COLONOSCOPY    . EYE SURGERY     cataracts removed right eye  . HYSTEROSCOPY W/D&C N/A 05/06/2018   Procedure: DILATATION AND CURETTAGE /HYSTEROSCOPY;  Surgeon: [name], MD;  Location: [facility];  Service: Gynecology;  Laterality: N/A;  . LAPAROSCOPY N/A 08/26/2018   Procedure: LAPAROSCOPY DIAGNOSTIC;  Surgeon: [name], MD;  Location: [facility];  Service: General;  Laterality: N/A;  . MULTIPLE TOOTH EXTRACTIONS    . ROBOTIC ASSISTED TOTAL HYSTERECTOMY WITH BILATERAL SALPINGO OOPHERECTOMY Bilateral 05/24/2018   Procedure: XI ROBOTIC ASSISTED TOTAL HYSTERECTOMY WITH BILATERAL SALPINGO OOPHORECTOMY;  Surgeon: [name], MD;  Location: [name];  Service: Gynecology;  Laterality: Bilateral;  . SENTINEL NODE BIOPSY N/A 05/24/2018   Procedure: SENTINEL LYMP  NODE BIOPSY;  Surgeon: [name], MD;  Location: [name];  Service: Gynecology;  Laterality: N/A;    Past Medical Hx:  Past Medical History:  Diagnosis Date  . Anemia   . Arthritis    knees, hands  . Coronary artery disease   . Diabetes mellitus without complication (HCC)    no meds  . Dyspnea 01/12/2018   w/anemia dx and hospital admit w/blood transfused   . Endometrioid adenocarcinoma of uterus ([place]) 05/13/2018  . History of blood transfusion 01/12/2018   at [facility]  . Hyperlipemia   . Hypertension   . SVD (spontaneous vaginal delivery)    x 6 - 5 living children  . Uses walker    rollator  . Wears partial dentures    upper and lower partials    Past Gynecological History:  See HPI  No LMP recorded (lmp unknown). Patient has had a hysterectomy.  Family Hx:  Family History  Problem Relation Age of Onset  . Obesity Son   . Hypertension Father   . Stroke Father   . Hypertension Sister    . Heart failure Sister   . Hypertension Brother   . Hypertension Sister   . Hypertension Sister  Review of Systems:  Constitutional  Feels well,  + weight loss  ENT Normal appearing ears and nares bilaterally Skin/Breast  No rash, sores, jaundice, itching, dryness Cardiovascular  No chest pain, shortness of breath, or edema  Pulmonary  No cough or wheeze.  Gastro Intestinal  No nausea, vomitting, or diarrhoea. No bright red blood per rectum, no abdominal pain, change in bowel movement, or constipation.  Genito Urinary  No frequency, urgency, dysuria, no bleeding Musculo Skeletal  No myalgia, arthralgia, joint swelling or pain  Neurologic  No weakness, numbness, change in gait,  Psychology  No depression, anxiety, insomnia.   Vitals:  Blood pressure (!) 175/68, pulse 73, temperature 98.5 F (36.9 C), temperature source Oral, resp. rate 18, height '5\' 2"'  (1.575 m), weight 170 lb 6.4 oz (77.3 kg), SpO2 99 %.  Physical Exam: WD in NAD Neck  Supple NROM, without any enlargements.  Lymph Node Survey No cervical supraclavicular or inguinal adenopathy Cardiovascular  Pulse normal rate, regularity and rhythm. S1 and S2 normal.  Lungs  Clear to auscultation bilateraly, without wheezes/crackles/rhonchi. Good air movement.  Skin  No rash/lesions/breakdown  Psychiatry  Alert and oriented to person, place, and time  Abdomen  Normoactive bowel sounds, abdomen soft, non-tender and mildly obese without evidence of hernia. Soft incisions.  Back No CVA tenderness Genito Urinary  Vaginal cuff smooth, no blood, no lesions. No pelvic masses Rectal  deferred Extremities  No bilateral cyanosis, clubbing or edema.   Thereasa Solo, MD  01/10/2019, 12:34 PM

## 2019-03-11 ENCOUNTER — Ambulatory Visit (INDEPENDENT_AMBULATORY_CARE_PROVIDER_SITE_OTHER): Payer: Medicare HMO | Admitting: Cardiology

## 2019-03-11 ENCOUNTER — Other Ambulatory Visit: Payer: Self-pay

## 2019-03-11 ENCOUNTER — Encounter: Payer: Self-pay | Admitting: Cardiology

## 2019-03-11 VITALS — BP 183/83 | HR 56 | Ht 62.0 in | Wt 186.0 lb

## 2019-03-11 DIAGNOSIS — I5032 Chronic diastolic (congestive) heart failure: Secondary | ICD-10-CM | POA: Diagnosis not present

## 2019-03-11 DIAGNOSIS — I10 Essential (primary) hypertension: Secondary | ICD-10-CM

## 2019-03-11 NOTE — Progress Notes (Signed)
Patient referred by Nolene Ebbs, MD for heart failure.   Subjective:   Brenda Alexander, female    DOB: 12/16/1932, 85 y.o.   MRN: [id number]  I connected with the patient on 03/11/2019 by a telephone call and verified that I am speaking with the correct person using two identifiers.     I offered the patient a video enabled application for a virtual visit. Unfortunately, this could not be accomplished due to technical difficulties/lack of video enabled phone/computer. I discussed the limitations of evaluation and management by telemedicine and the availability of in person appointments. The patient expressed understanding and agreed to proceed.   This visit type was conducted due to national recommendations for restrictions regarding the COVID-19 Pandemic (e.g. social distancing).  This format is felt to be most appropriate for this patient at this time.  All issues noted in this document were discussed and addressed.  No physical exam was performed (except for noted visual exam findings with Tele health visits).  The patient has consented to conduct a Tele health visit and understands insurance will be billed.   HPI  85 year old African-American female with uncontrolled hypertension, type 2 diabetes mellitus, history of endometrioid adenocarcinoma of the uterus now status post hysterectomy in 2019, history of blood loss anemia, HFpEF.  Since her last visit, she states that her leg swelling has improved. She has not had any more near syncopal episodes. She is compliant with her medical therapy. While her blood pressure is elevated today, SBP has been as low as 120, according to her.   She has upcoming in office visit with her PCP in two weeks. She potentially will require extraction of all her teeth in the near future, according to her dentist.   Past Medical History:  Diagnosis Date  . Anemia   . Arthritis    knees, hands  . Coronary artery disease   . Diabetes mellitus without  complication (HCC)    no meds  . Dyspnea 01/12/2018   w/anemia dx and hospital admit w/blood transfused   . Endometrioid adenocarcinoma of uterus ([place]) 05/13/2018  . History of blood transfusion 01/12/2018   at [facility]  . Hyperlipemia   . Hypertension   . SVD (spontaneous vaginal delivery)    x 6 - 5 living children  . Uses walker    rollator  . Wears partial dentures    upper and lower partials     Past Surgical History:  Procedure Laterality Date  . COLONOSCOPY    . EYE SURGERY     cataracts removed right eye  . HYSTEROSCOPY W/D&C N/A 05/06/2018   Procedure: DILATATION AND CURETTAGE /HYSTEROSCOPY;  Surgeon: [name], MD;  Location: [facility];  Service: Gynecology;  Laterality: N/A;  . LAPAROSCOPY N/A 08/26/2018   Procedure: LAPAROSCOPY DIAGNOSTIC;  Surgeon: [name], MD;  Location: [facility];  Service: General;  Laterality: N/A;  . MULTIPLE TOOTH EXTRACTIONS    . ROBOTIC ASSISTED TOTAL HYSTERECTOMY WITH BILATERAL SALPINGO OOPHERECTOMY Bilateral 05/24/2018   Procedure: XI ROBOTIC ASSISTED TOTAL HYSTERECTOMY WITH BILATERAL SALPINGO OOPHORECTOMY;  Surgeon: [name], MD;  Location: [name];  Service: Gynecology;  Laterality: Bilateral;  . SENTINEL NODE BIOPSY N/A 05/24/2018   Procedure: SENTINEL LYMP  NODE BIOPSY;  Surgeon: [name], MD;  Location: [name];  Service: Gynecology;  Laterality: N/A;     Social History   Socioeconomic History  . Marital status: Widowed    Spouse name: Not on file  . Number of  children: 5  . Years of education: Not on file  . Highest education level: Not on file  Occupational History  . Occupation: retired  Scientific laboratory technician  . Financial resource strain: Not on file  . Food insecurity    Worry: Not on file    Inability: Not on file  . Transportation needs    Medical: Not on file    Non-medical: Not on file  Tobacco Use  . Smoking status: Never Smoker  . Smokeless tobacco: Never Used  Substance and Sexual Activity  .  Alcohol use: Not Currently    Frequency: Never  . Drug use: Never  . Sexual activity: Not Currently    Partners: Male    Birth control/protection: Post-menopausal  Lifestyle  . Physical activity    Days per week: Not on file    Minutes per session: Not on file  . Stress: Not on file  Relationships  . Social Herbalist on phone: Not on file    Gets together: Not on file    Attends religious service: Not on file    Active member of club or organization: Not on file    Attends meetings of clubs or organizations: Not on file    Relationship status: Not on file  . Intimate partner violence    Fear of current or ex partner: Not on file    Emotionally abused: Not on file    Physically abused: Not on file    Forced sexual activity: Not on file  Other Topics Concern  . Not on file  Social History Narrative  . Not on file     Current Outpatient Medications on File Prior to Visit  Medication Sig Dispense Refill  . acetaminophen (TYLENOL) 500 MG tablet Take 1,000 mg by mouth every 6 (six) hours as needed (for pain).     . Calcium Carb-Cholecalciferol (CALCIUM + D3 PO) Take 1 tablet by mouth daily.    . cloNIDine (CATAPRES) 0.2 MG tablet Take 0.2 mg by mouth 2 (two) times daily.    . cycloSPORINE (RESTASIS) 0.05 % ophthalmic emulsion Place 1 drop into both eyes 2 (two) times daily.    Marland Kitchen diltiazem (CARDIZEM CD) 120 MG 24 hr capsule Take 1 capsule (120 mg total) by mouth daily. 60 capsule 3  . docusate sodium (COLACE) 100 MG capsule Take 1 capsule (100 mg total) by mouth 2 (two) times daily as needed for mild constipation or moderate constipation. 30 capsule 2  . enalapril (VASOTEC) 20 MG tablet Take 20 mg by mouth 2 (two) times daily.  4  . ferrous sulfate 325 (65 FE) MG EC tablet Take 1 tablet (325 mg total) by mouth 2 (two) times daily. 60 tablet 3  . furosemide (LASIX) 40 MG tablet Take 40 mg by mouth daily.    . isosorbide mononitrate (ISMO,MONOKET) 20 MG tablet Take 20 mg  by mouth daily.  4  . metoprolol succinate (TOPROL-XL) 50 MG 24 hr tablet Take 50 mg by mouth daily.  2  . oxyCODONE (OXY IR/ROXICODONE) 5 MG immediate release tablet Take 1 tablet (5 mg total) by mouth every 6 (six) hours as needed for moderate pain. 10 tablet 0  . polyethylene glycol (MIRALAX) packet Take 17 g by mouth daily as needed for mild constipation. 14 each 0  . potassium chloride SA (K-DUR,KLOR-CON) 20 MEQ tablet Take 20 mEq by mouth daily.    . simvastatin (ZOCOR) 20 MG tablet Take 20 mg by mouth at  bedtime.     . vitamin C (ASCORBIC ACID) 500 MG tablet Take 500 mg by mouth daily.     No current facility-administered medications on file prior to visit.     Cardiovascular studies:   Outside EKG 10/13/2018:  Sinus rhythm tachycardia 100 bpm.  Left ventricular hypertrophy.  Otherwise normal EKG.  Echocardiogram 08/26/2018: Mild LVH. Hyperdynamic LV with apical/intracavitary peak gradient 73 mmHg. Grade 1 DD Severe LA dilatation Mild aortic stenosis RVSP 54 mmHg  Recent labs: Results for ALTHA, SWEITZER (MRN [id number]) as of 10/27/2018 14:10  Ref. Range 08/30/2018 02:44  WBC Latest Ref Range: 4.0 - 10.5 K/uL 5.5  RBC Latest Ref Range: 3.87 - 5.11 MIL/uL 4.27  Hemoglobin Latest Ref Range: 12.0 - 15.0 g/dL 10.6 (L)  HCT Latest Ref Range: 36.0 - 46.0 % 35.2 (L)  MCV Latest Ref Range: 80.0 - 100.0 fL 82.4  MCH Latest Ref Range: 26.0 - 34.0 pg 24.8 (L)  MCHC Latest Ref Range: 30.0 - 36.0 g/dL 30.1  RDW Latest Ref Range: 11.5 - 15.5 % 16.6 (H)  Platelets Latest Ref Range: 150 - 400 K/uL 298  nRBC Latest Ref Range: 0.0 - 0.2 % 0.0   Results for ALIESHA, DOLATA (MRN [id number]) as of 10/27/2018 14:10  Ref. Range 08/30/2018 02:44  WBC Latest Ref Range: 4.0 - 10.5 K/uL 5.5  RBC Latest Ref Range: 3.87 - 5.11 MIL/uL 4.27  Hemoglobin Latest Ref Range: 12.0 - 15.0 g/dL 10.6 (L)  HCT Latest Ref Range: 36.0 - 46.0 % 35.2 (L)  MCV Latest Ref Range: 80.0 - 100.0 fL 82.4  MCH Latest Ref Range: 26.0  - 34.0 pg 24.8 (L)  MCHC Latest Ref Range: 30.0 - 36.0 g/dL 30.1  RDW Latest Ref Range: 11.5 - 15.5 % 16.6 (H)  Platelets Latest Ref Range: 150 - 400 K/uL 298  nRBC Latest Ref Range: 0.0 - 0.2 % 0.0    Review of Systems  Constitution: Negative for decreased appetite, malaise/fatigue, weight gain and weight loss.  HENT: Negative for congestion.   Eyes: Negative for visual disturbance.  Cardiovascular: Positive for dyspnea on exertion (Stable) and leg swelling (Stable). Negative for chest pain, palpitations and syncope.  Respiratory: Negative for shortness of breath.   Endocrine: Negative for cold intolerance.  Hematologic/Lymphatic: Does not bruise/bleed easily.  Skin: Negative for itching and rash.  Musculoskeletal: Negative for myalgias.  Gastrointestinal: Negative for abdominal pain, nausea and vomiting.  Genitourinary: Negative for dysuria.  Neurological: Negative for dizziness (One recent episode) and weakness.  Psychiatric/Behavioral: The patient is not nervous/anxious.   All other systems reviewed and are negative.        Vitals:   03/11/19 1317  BP: (!) 183/83  Pulse: (!) 56    Objective:   Physical Exam  Not performed. Telephone visit.         Assessment & Recommendations:   85 year old African-American female with uncontrolled hypertension, type 2 diabetes mellitus, history of endometrioid adenocarcinoma of the uterus now status post hysterectomy in 2019, history of blood loss anemia, HFpEF.   1. Essential hypertension Suboptimal, but usually lower than today's visit. No changes made to her regimen today. Follow up with Dr. Jeanie Cooks in two weeks. Her blood pressure will need to be better controlled before dental extraction.  2. Chronic heart failure with preserved ejection fraction (HCC) Likely diastolic dysfunction with hyperdynamic LV and intracavitary obstruction. No syncope since being on metoprolol and diltiazem. Continue the same. Okay to use lasix 40  mg daily.  Follow up with me in 6 months.   Nigel Mormon, MD [facility] Pager: [phone] Office: [phone] If no answer Cell [phone]

## 2019-03-16 ENCOUNTER — Ambulatory Visit: Payer: Medicare HMO | Admitting: Cardiology

## 2019-09-15 ENCOUNTER — Ambulatory Visit: Payer: Medicare HMO | Admitting: Cardiology

## 2019-09-19 ENCOUNTER — Other Ambulatory Visit: Payer: Self-pay

## 2019-09-19 ENCOUNTER — Encounter: Payer: Self-pay | Admitting: Cardiology

## 2019-09-19 ENCOUNTER — Ambulatory Visit: Payer: Medicare HMO | Admitting: Cardiology

## 2019-09-19 VITALS — BP 184/110 | HR 85 | Temp 98.0°F | Ht 62.0 in | Wt 184.0 lb

## 2019-09-19 DIAGNOSIS — I5032 Chronic diastolic (congestive) heart failure: Secondary | ICD-10-CM

## 2019-09-19 DIAGNOSIS — I10 Essential (primary) hypertension: Secondary | ICD-10-CM

## 2019-09-19 DIAGNOSIS — R296 Repeated falls: Secondary | ICD-10-CM

## 2019-09-19 MED ORDER — DILTIAZEM HCL ER COATED BEADS 240 MG PO CP24: 240.0000 mg | ORAL_CAPSULE | Freq: Every day | ORAL | 2 refills | Status: DC

## 2019-09-19 NOTE — Progress Notes (Signed)
Patient referred by Nolene Ebbs, MD for heart failure.   Subjective:   Brenda Alexander, female    DOB: March 15, 1933, 86 y.o.   MRN: [id number]   HPI  86 year old African-American female with uncontrolled hypertension, type 2 diabetes mellitus, history of endometrioid adenocarcinoma of the uterus now status post hysterectomy in 2019, history of blood loss anemia, HFpEF.  Patient is here with her daughter today.  Her primary complaint today is generalized weakness and multiple falls.  He has been up all night over the bathroom, things started.  Endorsing shortness of breath.  Patient currently gets home health for 2 hours a day 3 days a week.  Daughter feels that this is not adequate.   Blood pressure is elevated today on multiple checks.  She does not have any chest pain, vision changes, headaches.   Patient states that she is establishing care with a new primary care physician at [facility] here in [place].    Current Outpatient Medications on File Prior to Visit  Medication Sig Dispense Refill  . acetaminophen (TYLENOL) 500 MG tablet Take 1,000 mg by mouth every 6 (six) hours as needed (for pain).     . Calcium Carb-Cholecalciferol (CALCIUM + D3 PO) Take 1 tablet by mouth daily.    . clindamycin (CLEOCIN) 150 MG capsule     . cloNIDine (CATAPRES) 0.2 MG tablet Take 0.2 mg by mouth 2 (two) times daily.    . cycloSPORINE (RESTASIS) 0.05 % ophthalmic emulsion Place 1 drop into both eyes 2 (two) times daily.    Marland Kitchen diltiazem (CARDIZEM CD) 120 MG 24 hr capsule Take 1 capsule (120 mg total) by mouth daily. 60 capsule 3  . docusate sodium (COLACE) 100 MG capsule Take 1 capsule (100 mg total) by mouth 2 (two) times daily as needed for mild constipation or moderate constipation. 30 capsule 2  . enalapril (VASOTEC) 20 MG tablet Take 20 mg by mouth 2 (two) times daily.  4  . ferrous sulfate 325 (65 FE) MG EC tablet Take 1 tablet (325 mg total) by mouth 2 (two) times daily. 60 tablet 3  .  furosemide (LASIX) 40 MG tablet Take 40 mg by mouth every other day.     . isosorbide mononitrate (ISMO,MONOKET) 20 MG tablet Take 20 mg by mouth daily.  4  . metoprolol succinate (TOPROL-XL) 50 MG 24 hr tablet Take 50 mg by mouth daily.  2  . potassium chloride SA (K-DUR,KLOR-CON) 20 MEQ tablet Take 20 mEq by mouth daily.    . simvastatin (ZOCOR) 20 MG tablet Take 20 mg by mouth at bedtime.     . vitamin C (ASCORBIC ACID) 500 MG tablet Take 500 mg by mouth daily.     No current facility-administered medications on file prior to visit.    Cardiovascular studies:   EKG 09/19/2019: Sinus tachycardia 90 bpm. Significant baseline artifact.  Echocardiogram 08/26/2018: Mild LVH. Hyperdynamic LV with apical/intracavitary peak gradient 73 mmHg. Grade 1 DD Severe LA dilatation Mild aortic stenosis RVSP 54 mmHg  Recent labs: 08/30/2018: H/H 10.6/35.2. MCV 82. Platelets 298   Review of Systems  Cardiovascular: Positive for dyspnea on exertion. Negative for chest pain, leg swelling, palpitations and syncope.  Musculoskeletal: Positive for falls.  Neurological: Positive for loss of balance.        Vitals:   09/19/19 0950  BP: (!) 184/110  Pulse: 85  Temp: 98 F (36.7 C)  SpO2: 98%   Orthostatic vitals: Supine: 183/102 mmHg, HR 79/min  Sitting: 209/104 mmHg Standing: 216/123 mmHg   Objective:   Physical Exam  Constitutional: She appears well-developed and well-nourished.  Neck: No JVD present.  Cardiovascular: Normal rate, regular rhythm, normal heart sounds and intact distal pulses.  No murmur heard. Pulmonary/Chest: Effort normal and breath sounds normal. She has no wheezes. She has no rales.  Musculoskeletal:        General: No edema.  Nursing note and vitals reviewed.        Assessment & Recommendations:   86 year old African-American female with uncontrolled hypertension, type 2 diabetes mellitus, history of endometrioid adenocarcinoma of the uterus now status post  hysterectomy in 2019, history of blood loss anemia, HFpEF.   1. Essential hypertension Suboptimal control, but no hypertensive emergency. Increase diltiazem to 240 mg daily. Continue rest of the antihypertensive therapy.  I will see her back in 4 weeks.   2. Chronic heart failure with preserved ejection fraction (HCC) Likely diastolic dysfunction with hyperdynamic LV and intracavitary obstruction. No syncope since being on metoprolol and diltiazem. Continue the same.  Patient's primary medical issues at this time are more pertaining to her advanced age, frequent falls, poor balance issues.  She needs follow-up with PCP regarding this, and will likely need 24/7 care.  I will see her back in 4 weeks to monitor her blood pressure.   Nigel Mormon, MD [facility] [place] Pager: [phone] Office: [phone] If no answer Cell [phone]

## 2019-09-26 ENCOUNTER — Ambulatory Visit: Payer: Medicare HMO | Attending: Internal Medicine

## 2019-10-20 ENCOUNTER — Ambulatory Visit: Payer: Medicare HMO | Admitting: Cardiology

## 2019-11-18 ENCOUNTER — Other Ambulatory Visit: Payer: Self-pay

## 2019-11-18 ENCOUNTER — Ambulatory Visit: Payer: Medicare HMO | Admitting: Cardiology

## 2019-11-18 ENCOUNTER — Encounter: Payer: Self-pay | Admitting: Cardiology

## 2019-11-18 VITALS — BP 163/81 | HR 60 | Temp 97.2°F | Resp 17 | Ht 62.0 in | Wt 174.0 lb

## 2019-11-18 DIAGNOSIS — I5032 Chronic diastolic (congestive) heart failure: Secondary | ICD-10-CM

## 2019-11-18 DIAGNOSIS — I10 Essential (primary) hypertension: Secondary | ICD-10-CM

## 2019-11-18 NOTE — Progress Notes (Signed)
Patient referred by Nolene Ebbs, MD for heart failure.   Subjective:   Brenda Alexander, female    DOB: September 21, 1932, 87 y.o.   MRN: [id number]   HPI  86 year old African-American female with uncontrolled hypertension, type 2 diabetes mellitus, history of endometrioid adenocarcinoma of the uterus now status post hysterectomy in 2019, history of blood loss anemia, HFpEF.  Patient is here with her daughter today.  She has had significant improvement in her hypertension since last visit.  She is compliant with medical therapy.  She has had significant improvement in her falls.  Current Outpatient Medications on File Prior to Visit  Medication Sig Dispense Refill  . acetaminophen (TYLENOL) 500 MG tablet Take 1,000 mg by mouth every 6 (six) hours as needed (for pain).     Marland Kitchen aspirin EC 81 MG tablet Take 81 mg by mouth daily.    . Calcium Carb-Cholecalciferol (CALCIUM + D3 PO) Take 1 tablet by mouth daily.    . clindamycin (CLEOCIN) 150 MG capsule     . cloNIDine (CATAPRES) 0.2 MG tablet Take 0.2 mg by mouth 2 (two) times daily.    . cycloSPORINE (RESTASIS) 0.05 % ophthalmic emulsion Place 1 drop into both eyes 2 (two) times daily.    Marland Kitchen diltiazem (CARDIZEM CD) 240 MG 24 hr capsule Take 1 capsule (240 mg total) by mouth daily. 90 capsule 2  . docusate sodium (COLACE) 100 MG capsule Take 1 capsule (100 mg total) by mouth 2 (two) times daily as needed for mild constipation or moderate constipation. 30 capsule 2  . enalapril (VASOTEC) 20 MG tablet Take 20 mg by mouth 2 (two) times daily.  4  . ferrous sulfate 325 (65 FE) MG EC tablet Take 1 tablet (325 mg total) by mouth 2 (two) times daily. 60 tablet 3  . furosemide (LASIX) 40 MG tablet Take 40 mg by mouth every other day.     . isosorbide mononitrate (ISMO,MONOKET) 20 MG tablet Take 20 mg by mouth daily.  4  . metoprolol succinate (TOPROL-XL) 50 MG 24 hr tablet Take 50 mg by mouth daily.  2  . potassium chloride SA (K-DUR,KLOR-CON) 20 MEQ tablet  Take 20 mEq by mouth daily.    Marland Kitchen senna (SENOKOT) 8.6 MG TABS tablet Take 1 tablet by mouth daily.    . simvastatin (ZOCOR) 20 MG tablet Take 20 mg by mouth at bedtime.     . vitamin C (ASCORBIC ACID) 500 MG tablet Take 500 mg by mouth daily.     No current facility-administered medications on file prior to visit.    Cardiovascular studies:   EKG 09/19/2019: Sinus tachycardia 90 bpm. Significant baseline artifact.  Echocardiogram 08/26/2018: Mild LVH. Hyperdynamic LV with apical/intracavitary peak gradient 73 mmHg. Grade 1 DD Severe LA dilatation Mild aortic stenosis RVSP 54 mmHg  Recent labs: 08/30/2018: H/H 10.6/35.2. MCV 82. Platelets 298   Review of Systems  Cardiovascular: Negative for chest pain, dyspnea on exertion, leg swelling, palpitations and syncope.        Vitals:   11/18/19 0917 11/18/19 0922  BP: (!) 185/90 (!) 163/81  Pulse: (!) 57 60  Resp: 17   Temp: (!) 97.2 F (36.2 C)   SpO2: 99%     Objective:   Physical Exam  Constitutional: She appears well-developed and well-nourished.  Neck: No JVD present.  Cardiovascular: Normal rate, regular rhythm, normal heart sounds and intact distal pulses.  No murmur heard. Pulmonary/Chest: Effort normal and breath sounds normal. She has no  wheezes. She has no rales.  Musculoskeletal:        General: No edema.  Nursing note and vitals reviewed.        Assessment & Recommendations:   86 year old African-American female with uncontrolled hypertension, type 2 diabetes mellitus, history of endometrioid adenocarcinoma of the uterus now status post hysterectomy in 2019, history of blood loss anemia, HFpEF.   1. Essential hypertension Remains elevated, but with significant improvement. No changes made today.  2. Chronic heart failure with preserved ejection fraction (HCC) Likely diastolic dysfunction with hyperdynamic LV and intracavitary obstruction. No syncope since being on metoprolol and diltiazem. Continue  the same.  F/u in 6 months  Ibrohim Simmers Esther Hardy, MD [facility] [place] Pager: [phone] Office: [phone] If no answer Cell [phone]

## 2019-11-26 ENCOUNTER — Ambulatory Visit: Payer: Medicare HMO

## 2019-12-06 ENCOUNTER — Other Ambulatory Visit: Payer: Self-pay

## 2019-12-06 DIAGNOSIS — I10 Essential (primary) hypertension: Secondary | ICD-10-CM

## 2019-12-06 MED ORDER — DILTIAZEM HCL ER COATED BEADS 240 MG PO CP24: 240.0000 mg | ORAL_CAPSULE | Freq: Every day | ORAL | 2 refills | Status: DC

## 2020-01-02 ENCOUNTER — Other Ambulatory Visit: Payer: Self-pay

## 2020-01-02 ENCOUNTER — Ambulatory Visit: Payer: Medicare HMO | Admitting: Podiatry

## 2020-03-19 ENCOUNTER — Other Ambulatory Visit: Payer: Self-pay

## 2020-03-19 ENCOUNTER — Encounter: Payer: Self-pay | Admitting: Podiatry

## 2020-03-19 ENCOUNTER — Ambulatory Visit (INDEPENDENT_AMBULATORY_CARE_PROVIDER_SITE_OTHER): Payer: Medicare HMO | Admitting: Podiatry

## 2020-03-19 DIAGNOSIS — B351 Tinea unguium: Secondary | ICD-10-CM

## 2020-03-19 DIAGNOSIS — M79674 Pain in right toe(s): Secondary | ICD-10-CM | POA: Diagnosis not present

## 2020-03-19 DIAGNOSIS — M2041 Other hammer toe(s) (acquired), right foot: Secondary | ICD-10-CM | POA: Diagnosis not present

## 2020-03-19 DIAGNOSIS — L84 Corns and callosities: Secondary | ICD-10-CM | POA: Diagnosis not present

## 2020-03-19 DIAGNOSIS — M2141 Flat foot [pes planus] (acquired), right foot: Secondary | ICD-10-CM | POA: Diagnosis not present

## 2020-03-19 DIAGNOSIS — E119 Type 2 diabetes mellitus without complications: Secondary | ICD-10-CM

## 2020-03-19 DIAGNOSIS — M79675 Pain in left toe(s): Secondary | ICD-10-CM

## 2020-03-19 DIAGNOSIS — M2042 Other hammer toe(s) (acquired), left foot: Secondary | ICD-10-CM

## 2020-03-19 DIAGNOSIS — E1142 Type 2 diabetes mellitus with diabetic polyneuropathy: Secondary | ICD-10-CM

## 2020-03-19 DIAGNOSIS — M2142 Flat foot [pes planus] (acquired), left foot: Secondary | ICD-10-CM

## 2020-03-19 NOTE — Patient Instructions (Signed)
Diabetes Mellitus and Foot Care Foot care is an important part of your health, especially when you have diabetes. Diabetes may cause you to have problems because of poor blood flow (circulation) to your feet and legs, which can cause your skin to:  Become thinner and drier.  Break more easily.  Heal more slowly.  Peel and crack. You may also have nerve damage (neuropathy) in your legs and feet, causing decreased feeling in them. This means that you may not notice minor injuries to your feet that could lead to more serious problems. Noticing and addressing any potential problems early is the best way to prevent future foot problems. How to care for your feet Foot hygiene  Wash your feet daily with warm water and mild soap. Do not use hot water. Then, pat your feet and the areas between your toes until they are completely dry. Do not soak your feet as this can dry your skin.  Trim your toenails straight across. Do not dig under them or around the cuticle. File the edges of your nails with an emery board or nail file.  Apply a moisturizing lotion or petroleum jelly to the skin on your feet and to dry, brittle toenails. Use lotion that does not contain alcohol and is unscented. Do not apply lotion between your toes. Shoes and socks  Wear clean socks or stockings every day. Make sure they are not too tight. Do not wear knee-high stockings since they may decrease blood flow to your legs.  Wear shoes that fit properly and have enough cushioning. Always look in your shoes before you put them on to be sure there are no objects inside.  To break in new shoes, wear them for just a few hours a day. This prevents injuries on your feet. Wounds, scrapes, corns, and calluses  Check your feet daily for blisters, cuts, bruises, sores, and redness. If you cannot see the bottom of your feet, use a mirror or ask someone for help.  Do not cut corns or calluses or try to remove them with medicine.  If you  find a minor scrape, cut, or break in the skin on your feet, keep it and the skin around it clean and dry. You may clean these areas with mild soap and water. Do not clean the area with peroxide, alcohol, or iodine.  If you have a wound, scrape, corn, or callus on your foot, look at it several times a day to make sure it is healing and not infected. Check for: ? Redness, swelling, or pain. ? Fluid or blood. ? Warmth. ? Pus or a bad smell. General instructions  Do not cross your legs. This may decrease blood flow to your feet.  Do not use heating pads or hot water bottles on your feet. They may burn your skin. If you have lost feeling in your feet or legs, you may not know this is happening until it is too late.  Protect your feet from hot and cold by wearing shoes, such as at the beach or on hot pavement.  Schedule a complete foot exam at least once a year (annually) or more often if you have foot problems. If you have foot problems, report any cuts, sores, or bruises to your health care provider immediately. Contact a health care provider if:  You have a medical condition that increases your risk of infection and you have any cuts, sores, or bruises on your feet.  You have an injury that is not   healing.  You have redness on your legs or feet.  You feel burning or tingling in your legs or feet.  You have pain or cramps in your legs and feet.  Your legs or feet are numb.  Your feet always feel cold.  You have pain around a toenail. Get help right away if:  You have a wound, scrape, corn, or callus on your foot and: ? You have pain, swelling, or redness that gets worse. ? You have fluid or blood coming from the wound, scrape, corn, or callus. ? Your wound, scrape, corn, or callus feels warm to the touch. ? You have pus or a bad smell coming from the wound, scrape, corn, or callus. ? You have a fever. ? You have a red line going up your leg. Summary  Check your feet every day  for cuts, sores, red spots, swelling, and blisters.  Moisturize feet and legs daily.  Wear shoes that fit properly and have enough cushioning.  If you have foot problems, report any cuts, sores, or bruises to your health care provider immediately.  Schedule a complete foot exam at least once a year (annually) or more often if you have foot problems. This information is not intended to replace advice given to you by your health care provider. Make sure you discuss any questions you have with your health care provider. Document Revised: 04/06/2019 Document Reviewed: 08/15/2016 Elsevier Patient Education  [place].  Edema  Edema is an abnormal buildup of fluids in the body tissues and under the skin. Swelling of the legs, feet, and ankles is a common symptom that becomes more likely as you get older. Swelling is also common in looser tissues, like around the eyes. When the affected area is squeezed, the fluid may move out of that spot and leave a dent for a few moments. This dent is called pitting edema. There are many possible causes of edema. Eating too much salt (sodium) and being on your feet or sitting for a long time can cause edema in your legs, feet, and ankles. Hot weather may make edema worse. Common causes of edema include:  Heart failure.  Liver or kidney disease.  Weak leg blood vessels.  Cancer.  An injury.  Pregnancy.  Medicines.  Being obese.  Low protein levels in the blood. Edema is usually painless. Your skin may look swollen or shiny. Follow these instructions at home:  Keep the affected body part raised (elevated) above the level of your heart when you are sitting or lying down.  Do not sit still or stand for long periods of time.  Do not wear tight clothing. Do not wear garters on your upper legs.  Exercise your legs to get your circulation going. This helps to move the fluid back into your blood vessels, and it may help the swelling go  down.  Wear elastic bandages or support stockings to reduce swelling as told by your health care provider.  Eat a low-salt (low-sodium) diet to reduce fluid as told by your health care provider.  Depending on the cause of your swelling, you may need to limit how much fluid you drink (fluid restriction).  Take over-the-counter and prescription medicines only as told by your health care provider. Contact a health care provider if:  Your edema does not get better with treatment.  You have heart, liver, or kidney disease and have symptoms of edema.  You have sudden and unexplained weight gain. Get help right away if:  You develop shortness of breath or chest pain.  You cannot breathe when you lie down.  You develop pain, redness, or warmth in the swollen areas.  You have heart, liver, or kidney disease and suddenly get edema.  You have a fever and your symptoms suddenly get worse. Summary  Edema is an abnormal buildup of fluids in the body tissues and under the skin.  Eating too much salt (sodium) and being on your feet or sitting for a long time can cause edema in your legs, feet, and ankles.  Keep the affected body part raised (elevated) above the level of your heart when you are sitting or lying down. This information is not intended to replace advice given to you by your health care provider. Make sure you discuss any questions you have with your health care provider. Document Revised: 12/01/2018 Document Reviewed: 08/16/2016 Elsevier Patient Education  [place].   Onychomycosis/Fungal Toenails  WHAT IS IT? An infection that lies within the keratin of your nail plate that is caused by a fungus.  WHY ME? Fungal infections affect all ages, sexes, races, and creeds.  There may be many factors that predispose you to a fungal infection such as age, coexisting medical conditions such as diabetes, or an autoimmune disease; stress, medications, fatigue, genetics, etc.   Bottom line: fungus thrives in a warm, moist environment and your shoes offer such a location.  IS IT CONTAGIOUS? Theoretically, yes.  You do not want to share shoes, nail clippers or files with someone who has fungal toenails.  Walking around barefoot in the same room or sleeping in the same bed is unlikely to transfer the organism.  It is important to realize, however, that fungus can spread easily from one nail to the next on the same foot.  HOW DO WE TREAT THIS?  There are several ways to treat this condition.  Treatment may depend on many factors such as age, medications, pregnancy, liver and kidney conditions, etc.  It is best to ask your doctor which options are available to you.  14. No treatment.   Unlike many other medical concerns, you can live with this condition.  However for many people this can be a painful condition and may lead to ingrown toenails or a bacterial infection.  It is recommended that you keep the nails cut short to help reduce the amount of fungal nail. 15. Topical treatment.  These range from herbal remedies to prescription strength nail lacquers.  About 40-50% effective, topicals require twice daily application for approximately 9 to 12 months or until an entirely new nail has grown out.  The most effective topicals are medical grade medications available through physicians offices. 16. Oral antifungal medications.  With an 80-90% cure rate, the most common oral medication requires 3 to 4 months of therapy and stays in your system for a year as the new nail grows out.  Oral antifungal medications do require blood work to make sure it is a safe drug for you.  A liver function panel will be performed prior to starting the medication and after the first month of treatment.  It is important to have the blood work performed to avoid any harmful side effects.  In general, this medication safe but blood work is required. 17. Laser Therapy.  This treatment is performed by applying a  specialized laser to the affected nail plate.  This therapy is noninvasive, fast, and non-painful.  It is not covered by insurance and is therefore,  out of pocket.  The results have been very good with a 80-95% cure rate.  [facility] is the only practice in the area to offer this therapy. Permanent Nail Avulsion.  Removing the entire nail so that a new nail will not grow back.  Hammer Toe  Hammer toe is a change in the shape (a deformity) of your toe. The deformity causes the middle joint of your toe to stay bent. This causes pain, especially when you are wearing shoes. Hammer toe starts gradually. At first, the toe can be straightened. Gradually over time, the deformity becomes stiff and permanent. Early treatments to keep the toe straight may relieve pain. As the deformity becomes stiff and permanent, surgery may be needed to straighten the toe. What are the causes? Hammer toe is caused by abnormal bending of the toe joint that is closest to your foot. It happens gradually over time. This pulls on the muscles and connections (tendons) of the toe joint, making them weak and stiff. It is often related to wearing shoes that are too short or narrow and do not let your toes straighten. What increases the risk? You may be at greater risk for hammer toe if you:  Are female.  Are older.  Wear shoes that are too small.  Wear high-heeled shoes that pinch your toes.  Are a Engineer, mining.  Have a second toe that is longer than your big toe (first toe).  Injure your foot or toe.  Have arthritis.  Have a family history of hammer toe.  Have a nerve or muscle disorder. What are the signs or symptoms? The main symptoms of this condition are pain and deformity of the toe. The pain is worse when wearing shoes, walking, or running. Other symptoms may include:  Corns or calluses over the bent part of the toe or between the toes.  Redness and a burning feeling on the toe.  An open sore  that forms on the top of the toe.  Not being able to straighten the toe. How is this diagnosed? This condition is diagnosed based on your symptoms and a physical exam. During the exam, your health care provider will try to straighten your toe to see how stiff the deformity is. You may also have tests, such as:  A blood test to check for rheumatoid arthritis.  An X-ray to show how severe the deformity is. How is this treated? Treatment for this condition will depend on how stiff the deformity is. Surgery is often needed. However, sometimes a hammer toe can be straightened without surgery. Treatments that do not involve surgery include:  Taping the toe into a straightened position.  Using pads and cushions to protect the toe (orthotics).  Wearing shoes that provide enough room for the toes.  Doing toe-stretching exercises at home.  Taking an NSAID to reduce pain and swelling. If these treatments do not help or the toe cannot be straightened, surgery is the next option. The most common surgeries used to straighten a hammer toe include:  Arthroplasty. In this procedure, part of the joint is removed, and that allows the toe to straighten.  Fusion. In this procedure, cartilage between the two bones of the joint is taken out and the bones are fused together into one longer bone.  Implantation. In this procedure, part of the bone is removed and replaced with an implant to let the toe move again.  Flexor tendon transfer. In this procedure, the tendons that curl the toes  down (flexor tendons) are repositioned. Follow these instructions at home:  Take over-the-counter and prescription medicines only as told by your health care provider.  Do toe straightening and stretching exercises as told by your health care provider.  Keep all follow-up visits as told by your health care provider. This is important. How is this prevented?  Wear shoes that give your toes enough room and do not cause  pain.  Do not wear high-heeled shoes. Contact a health care provider if:  Your pain gets worse.  Your toe becomes red or swollen.  You develop an open sore on your toe. This information is not intended to replace advice given to you by your health care provider. Make sure you discuss any questions you have with your health care provider. Document Revised: 06/26/2017 Document Reviewed: 11/07/2015 Elsevier Patient Education  [place] are small areas of thickened skin that occur on the top, sides, or tip of a toe. They contain a cone-shaped core with a point that can press on a nerve below. This causes pain.  Calluses are areas of thickened skin that can occur anywhere on the body, including the hands, fingers, palms, soles of the feet, and heels. Calluses are usually larger than corns. What are the causes? Corns and calluses are caused by rubbing (friction) or pressure, such as from shoes that are too tight or do not fit properly. What increases the risk? Corns are more likely to develop in people who have misshapen toes (toe deformities), such as hammer toes. Calluses can occur with friction to any area of the skin. They are more likely to develop in people who:  Work with their hands.  Wear shoes that fit poorly, are too tight, or are high-heeled.  Have toe deformities. What are the signs or symptoms? Symptoms of a corn or callus include:  A hard growth on the skin.  Pain or tenderness under the skin.  Redness and swelling.  Increased discomfort while wearing tight-fitting shoes, if your feet are affected. If a corn or callus becomes infected, symptoms may include:  Redness and swelling that gets worse.  Pain.  Fluid, blood, or pus draining from the corn or callus. How is this diagnosed? Corns and calluses may be diagnosed based on your symptoms, your medical history, and a physical exam. How is this treated? Treatment for corns  and calluses may include:  Removing the cause of the friction or pressure. This may involve: ? Changing your shoes. ? Wearing shoe inserts (orthotics) or other protective layers in your shoes, such as a corn pad. ? Wearing gloves.  Applying medicine to the skin (topical medicine) to help soften skin in the hardened, thickened areas.  Removing layers of dead skin with a file to reduce the size of the corn or callus.  Removing the corn or callus with a scalpel or laser.  Taking antibiotic medicines, if your corn or callus is infected.  Having surgery, if a toe deformity is the cause. Follow these instructions at home:   Take over-the-counter and prescription medicines only as told by your health care provider.  If you were prescribed an antibiotic, take it as told by your health care provider. Do not stop taking it even if your condition starts to improve.  Wear shoes that fit well. Avoid wearing high-heeled shoes and shoes that are too tight or too loose.  Wear any padding, protective layers, gloves, or orthotics as told by your health care  provider.  Soak your hands or feet and then use a file or pumice stone to soften your corn or callus. Do this as told by your health care provider.  Check your corn or callus every day for symptoms of infection. Contact a health care provider if you:  Notice that your symptoms do not improve with treatment.  Have redness or swelling that gets worse.  Notice that your corn or callus becomes painful.  Have fluid, blood, or pus coming from your corn or callus.  Have new symptoms. Summary  Corns are small areas of thickened skin that occur on the top, sides, or tip of a toe.  Calluses are areas of thickened skin that can occur anywhere on the body, including the hands, fingers, palms, and soles of the feet. Calluses are usually larger than corns.  Corns and calluses are caused by rubbing (friction) or pressure, such as from shoes that are  too tight or do not fit properly.  Treatment may include wearing any padding, protective layers, gloves, or orthotics as told by your health care provider. This information is not intended to replace advice given to you by your health care provider. Make sure you discuss any questions you have with your health care provider. Document Revised: 11/03/2018 Document Reviewed: 05/27/2017 Elsevier Patient Education  2020 [employer].

## 2020-03-20 NOTE — Progress Notes (Signed)
Subjective: Brenda Alexander presents today referred by Nolene Ebbs, MD for diabetic foot evaluation.  Her daughter is present during today's visit and assists in giving some of [name] medical history. Daughter states [name] has been cutting her own toenails. Patient admits this. Patient also admits to trimming corn on left 3rd toe.  Patient relates 20 year history of diabetes.  Patient denies any history of foot wounds.  Patient denies any history of numbness, tingling, burning, pins/needles sensations.  Today, patient c/o of several month history of painful, discolored, thick toenails which interfere with daily activities.  Pain is aggravated when wearing enclosed shoe gear. She also has corns b/l 3rd digits which she has trimmed in the past.  Past Medical History:  Diagnosis Date  . Anemia   . Arthritis    knees, hands  . Coronary artery disease   . Diabetes mellitus without complication (HCC)    no meds  . Dyspnea 01/12/2018   w/anemia dx and hospital admit w/blood transfused   . 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 History of blood transfusion 01/12/2018   at [facility]  . Hyperlipemia   . Hypertension   . SVD (spontaneous vaginal delivery)    x 6 - 5 living children  . Uses walker    rollator  . Wears partial dentures    upper and lower partials    Patient Active Problem List   Diagnosis Date Noted  . Chronic heart failure with preserved ejection fraction ([place]) 10/27/2018  . Frequent falls 10/27/2018  . SBO (small bowel obstruction) ([place]) 07/22/2018  . Leukocytosis 07/22/2018  . Hypokalemia 07/22/2018  . Type 2 diabetes mellitus ([place]) 07/22/2018  . 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 Physical deconditioning 07/22/2018  . 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 PMB (postmenopausal bleeding) 04/09/2018  . Bladder mass   . Anemia 01/11/2018  . Essential hypertension 01/11/2018  . Coronary artery disease 01/11/2018     Past Surgical History:  Procedure Laterality Date  . COLONOSCOPY    . EYE SURGERY     cataracts removed right eye  . HYSTEROSCOPY WITH D & C N/A 05/06/2018   Procedure: DILATATION AND CURETTAGE /HYSTEROSCOPY;  Surgeon: [name], MD;  Location: [facility];  Service: Gynecology;  Laterality: N/A;  . LAPAROSCOPY N/A 08/26/2018   Procedure: LAPAROSCOPY DIAGNOSTIC;  Surgeon: [name], MD;  Location: [facility];  Service: General;  Laterality: N/A;  . MULTIPLE TOOTH EXTRACTIONS    . ROBOTIC ASSISTED TOTAL HYSTERECTOMY WITH BILATERAL SALPINGO OOPHERECTOMY Bilateral 05/24/2018   Procedure: XI ROBOTIC ASSISTED TOTAL HYSTERECTOMY WITH BILATERAL SALPINGO OOPHORECTOMY;  Surgeon: [name], MD;  Location: [name];  Service: Gynecology;  Laterality: Bilateral;  . SENTINEL NODE BIOPSY N/A 05/24/2018   Procedure: SENTINEL LYMP  NODE BIOPSY;  Surgeon: [name], MD;  Location: [name];  Service: Gynecology;  Laterality: N/A;    Current Outpatient Medications on File Prior to Visit  Medication Sig Dispense Refill  . acetaminophen (TYLENOL) 500 MG tablet Take 1,000 mg by mouth every 6 (six) hours as needed (for pain).     Marland Kitchen aspirin EC 81 MG tablet Take 81 mg by mouth daily.    . Calcium Carb-Cholecalciferol (CALCIUM + D3 PO) Take 1 tablet by mouth daily.    . clindamycin (CLEOCIN) 150 MG capsule     . cloNIDine (CATAPRES) 0.2 MG tablet Take 0.2 mg by mouth 2 (two) times daily.    . cycloSPORINE (RESTASIS) 0.05 % ophthalmic emulsion  Place 1 drop into both eyes 2 (two) times daily.    Marland Kitchen diltiazem (CARDIZEM CD) 240 MG 24 hr capsule Take 1 capsule (240 mg total) by mouth daily. 90 capsule 2  . docusate sodium (COLACE) 100 MG capsule Take 1 capsule (100 mg total) by mouth 2 (two) times daily as needed for mild constipation or moderate constipation. 30 capsule 2  . enalapril (VASOTEC) 20 MG tablet Take 20 mg by mouth 2 (two) times daily.  4  . ferrous sulfate 325 (65 FE) MG EC tablet Take 1 tablet  (325 mg total) by mouth 2 (two) times daily. 60 tablet 3  . furosemide (LASIX) 40 MG tablet Take 40 mg by mouth every other day.     . isosorbide mononitrate (ISMO,MONOKET) 20 MG tablet Take 20 mg by mouth daily.  4  . metoprolol succinate (TOPROL-XL) 50 MG 24 hr tablet Take 50 mg by mouth daily.  2  . omeprazole (PRILOSEC) 20 MG capsule     . potassium chloride SA (K-DUR,KLOR-CON) 20 MEQ tablet Take 20 mEq by mouth daily.    Marland Kitchen senna (SENOKOT) 8.6 MG TABS tablet Take 1 tablet by mouth daily.    . simvastatin (ZOCOR) 20 MG tablet Take 20 mg by mouth at bedtime.     . traMADol (ULTRAM) 50 MG tablet     . vitamin C (ASCORBIC ACID) 500 MG tablet Take 500 mg by mouth daily.     No current facility-administered medications on file prior to visit.     Allergies  Allergen Reactions  . Other Anaphylaxis and Swelling    Walnuts = Throat swells  . Penicillins Swelling and Rash    Has patient had a PCN reaction causing immediate rash, facial/tongue/throat swelling, SOB or lightheadedness with hypotension: Yes Has patient had a PCN reaction causing severe rash involving mucus membranes or skin necrosis: Unk Has patient had a PCN reaction that required hospitalization: Unk Has patient had a PCN reaction occurring within the last 10 years: No If all of the above answers are "NO", then may proceed with Cephalosporin use.     Social History   Occupational History  . Occupation: retired  Tobacco Use  . Smoking status: Never Smoker  . Smokeless tobacco: Never Used  Vaping Use  . Vaping Use: Never used  Substance and Sexual Activity  . Alcohol use: Not Currently  . Drug use: Never  . Sexual activity: Not Currently    Partners: Male    Birth control/protection: Post-menopausal    Family History  Problem Relation Age of Onset  . Obesity Son   . Hypertension Father   . Stroke Father   . Hypertension Sister   . Heart failure Sister   . Hypertension Brother   . Hypertension Sister   .  Hypertension Sister     Immunization History  Administered Date(s) Administered  . Influenza,inj,Quad PF,6+ Mos 04/07/2018  . Tdap 04/07/2018    Review of systems: Positive Findings in bold print.  Constitutional:  chills, fatigue, fever, sweats, weight change Communication: Optometrist, sign Ecologist, hand writing, iPad/Android device Head: headaches, head injury Eyes: changes in vision, eye pain, glaucoma, cataracts, macular degeneration, diplopia, glare,  light sensitivity, eyeglasses or contacts, blindness Ears nose mouth throat: hearing impaired, hearing aids,  ringing in ears, deaf, sign language,  vertigo, nosebleeds,  rhinitis,  cold sores, snoring, swollen glands Cardiovascular: HTN, edema, arrhythmia, pacemaker in place, defibrillator in place, chest pain/tightness, chronic anticoagulation, blood clot, heart failure, MI Peripheral  Vascular: leg cramps, varicose veins, blood clots, lymphedema, varicosities Respiratory:  difficulty breathing, denies congestion, SOB, wheezing, cough, emphysema Gastrointestinal: change in appetite or weight, abdominal pain, constipation, diarrhea, nausea, vomiting, vomiting blood, change in bowel habits, abdominal pain, jaundice, rectal bleeding, hemorrhoids, GERD Genitourinary:  nocturia,  pain on urination, polyuria,  blood in urine, Foley catheter, urinary urgency, ESRD on hemodialysis Musculoskeletal: amputation, cramping, stiff joints, painful joints, decreased joint motion, fractures, OA, gout, hemiplegia, paraplegia, uses cane, wheelchair bound, uses walker, uses rollator Skin: +changes in toenails, color change, dryness, itching, mole changes,  rash, wound(s) Neurological: headaches, numbness in feet, paresthesias in feet, burning in feet, fainting,  seizures, change in speech,  headaches, memory problems/poor historian, cerebral palsy, weakness, paralysis, CVA, TIA Endocrine: diabetes, hypothyroidism, hyperthyroidism,  goiter, dry  mouth, flushing, heat intolerance,  cold intolerance,  excessive thirst, denies polyuria,  nocturia Hematological:  easy bleeding, excessive bleeding, easy bruising, enlarged lymph nodes, on long term blood thinner, history of past transusions Allergy/immunological:  hives, eczema, frequent infections, multiple drug allergies, seasonal allergies, transplant recipient, multiple food allergies Psychiatric:  anxiety, depression, mood disorder, suicidal ideations, hallucinations, insomnia  Objective: There were no vitals filed for this visit.  Uliana Brinker is a pleasant 86 y.o. female morbidly obese in NAD. AAO X 3.  Vascular Examination: Capillary fill time to digits <3 seconds b/l lower extremities. Faintly palpable DP pulse(s) b/l lower extremities. Nonpalpable PT pulse(s) b/l lower extremities. Pedal hair absent. Lower extremity skin temperature gradient within normal limits. No pain with calf compression b/l. Nonpitting edema noted b/l lower extremities.  Dermatological Examination: Pedal skin with normal turgor, texture and tone bilaterally. No open wounds bilaterally. No interdigital macerations bilaterally. Toenails 1-5 b/l elongated, discolored, dystrophic, thickened, crumbly with subungual debris and tenderness to dorsal palpation. Hyperkeratotic lesion(s) L 3rd toe and R 3rd toe.  No erythema, no edema, no drainage, no flocculence.  Musculoskeletal Examination: Normal muscle strength 5/5 to all lower extremity muscle groups bilaterally. No pain crepitus or joint limitation noted with ROM b/l. Hallux valgus with bunion deformity noted b/l lower extremities. Hammertoes noted to the b/l lower extremities. Pes planus deformity noted b/l.  Utilizes rollator for ambulation assistance.  Footwear Assessment: Does the patient wear appropriate shoes? Yes. Does the patient need inserts/orthotics? Yes.  Neurological Examination: Protective sensation intact 5/5 intact bilaterally with 10g  monofilament b/l. Vibratory sensation diminished b/l. Clonus negative b/l.  No flowsheet data found.   Assessment: No diagnosis found.   ADA Risk Categorization:  High Risk:  Patient has one or more of the following: Loss of protective sensation Absent pedal pulses Severe Foot deformity History of foot ulcer  Plan: -Examined patient. -Diabetic foot examination performed on today's visit. -Toenails 1-5 b/l were debrided in length and girth with sterile nail nippers and dremel without iatrogenic bleeding.  -Corn(s) L 3rd toe and R 3rd toe pared utilizing sterile scalpel blade without complication or incident. Total number debrided=2. -Patient to report any pedal injuries to medical professional immediately. -Patient to continue soft, supportive shoe gear daily. Start procedure for diabetic shoes. Patient qualifies based on diagnoses. -Dispensed digital toe caps for b/l 3rd digit corns. Apply every morning. Remove every evening. -Patient/POA to call should there be question/concern in the interim.  Return in about 3 months (around 06/19/2020).

## 2020-05-04 ENCOUNTER — Other Ambulatory Visit: Payer: Self-pay | Admitting: Cardiology

## 2020-05-04 DIAGNOSIS — I10 Essential (primary) hypertension: Secondary | ICD-10-CM

## 2020-05-21 ENCOUNTER — Encounter: Payer: Self-pay | Admitting: Cardiology

## 2020-05-21 ENCOUNTER — Other Ambulatory Visit: Payer: Self-pay

## 2020-05-21 ENCOUNTER — Ambulatory Visit: Payer: Medicare HMO | Admitting: Cardiology

## 2020-05-21 VITALS — BP 146/75 | HR 62 | Resp 16 | Ht 62.0 in | Wt 162.0 lb

## 2020-05-21 DIAGNOSIS — I5032 Chronic diastolic (congestive) heart failure: Secondary | ICD-10-CM

## 2020-05-21 DIAGNOSIS — I10 Essential (primary) hypertension: Secondary | ICD-10-CM

## 2020-05-21 NOTE — Progress Notes (Addendum)
Patient referred by Nolene Ebbs, MD for heart failure.   Subjective:   Brenda Alexander, female    DOB: [birth year], 86 y.o.   MRN: [id number]   HPI  86 year old African-American female with uncontrolled hypertension, type 2 diabetes mellitus, history of endometrioid adenocarcinoma of the uterus now status post hysterectomy in 2019, history of blood loss anemia, HFpEF.  Patient is here with her daughter today.  She denies dyspnea with her minimal activity at home. However, she has noticed bilateral leg edema. She has lost 12 lbs unintentionally in the past month or so. She denies any obvious bleeding.    Current Outpatient Medications on File Prior to Visit  Medication Sig Dispense Refill  . acetaminophen (TYLENOL) 500 MG tablet Take 1,000 mg by mouth every 6 (six) hours as needed (for pain).     Marland Kitchen aspirin EC 81 MG tablet Take 81 mg by mouth daily.    . Calcium Carb-Cholecalciferol (CALCIUM + D3 PO) Take 1 tablet by mouth daily.    . clindamycin (CLEOCIN) 150 MG capsule     . cloNIDine (CATAPRES) 0.2 MG tablet Take 0.2 mg by mouth 2 (two) times daily.    . cycloSPORINE (RESTASIS) 0.05 % ophthalmic emulsion Place 1 drop into both eyes 2 (two) times daily.    Marland Kitchen diltiazem (CARDIZEM CD) 240 MG 24 hr capsule TAKE 1 CAPSULE EVERY DAY 90 capsule 2  . docusate sodium (COLACE) 100 MG capsule Take 1 capsule (100 mg total) by mouth 2 (two) times daily as needed for mild constipation or moderate constipation. 30 capsule 2  . enalapril (VASOTEC) 20 MG tablet Take 20 mg by mouth 2 (two) times daily.  4  . ferrous sulfate 325 (65 FE) MG EC tablet Take 1 tablet (325 mg total) by mouth 2 (two) times daily. 60 tablet 3  . furosemide (LASIX) 40 MG tablet Take 40 mg by mouth every other day.     . isosorbide mononitrate (ISMO,MONOKET) 20 MG tablet Take 20 mg by mouth daily.  4  . metoprolol succinate (TOPROL-XL) 50 MG 24 hr tablet Take 50 mg by mouth daily.  2  . omeprazole (PRILOSEC) 20 MG capsule     .  potassium chloride SA (K-DUR,KLOR-CON) 20 MEQ tablet Take 20 mEq by mouth daily.    Marland Kitchen senna (SENOKOT) 8.6 MG TABS tablet Take 1 tablet by mouth daily.    . simvastatin (ZOCOR) 20 MG tablet Take 20 mg by mouth at bedtime.     . traMADol (ULTRAM) 50 MG tablet     . vitamin C (ASCORBIC ACID) 500 MG tablet Take 500 mg by mouth daily.     No current facility-administered medications on file prior to visit.    Cardiovascular studies:  EKG 05/21/2020: Sinus rhythm 62 bpm Left ventricular hypertrophy Left axis deviation Decreasing R-wave progression   EKG 09/19/2019: Sinus tachycardia 90 bpm. Significant baseline artifact.  Echocardiogram 08/26/2018: Mild LVH. Hyperdynamic LV with apical/intracavitary peak gradient 73 mmHg. Grade 1 DD Severe LA dilatation Mild aortic stenosis RVSP 54 mmHg  Recent labs: 08/30/2018: H/H 10.6/35.2. MCV 82. Platelets 298   Review of Systems  Cardiovascular: Negative for chest pain, dyspnea on exertion, leg swelling, palpitations and syncope.        Vitals:   05/21/20 1459  BP: (!) 146/75  Pulse: 62  Resp: 16  SpO2: 98%    Objective:   Physical Exam Vitals and nursing note reviewed.  Constitutional:      Appearance: She is  well-developed.  Neck:     Vascular: No JVD.  Cardiovascular:     Rate and Rhythm: Normal rate and regular rhythm.     Pulses: Intact distal pulses.     Heart sounds: Normal heart sounds. No murmur heard.   Pulmonary:     Effort: Pulmonary effort is normal.     Breath sounds: Normal breath sounds. No wheezing or rales.  Musculoskeletal:     Right lower leg: Edema (3+) present.     Left lower leg: Edema (3+) present.          Assessment & Recommendations:   86 year old African-American female with uncontrolled hypertension, type 2 diabetes mellitus, history of endometrioid adenocarcinoma of the uterus now status post hysterectomy in 2019, history of blood loss anemia, HFpEF.   Essential hypertension Fairly  well controlled No changes made today.  HFpEF, pulmonary hypertension: Clinical worsening. Increase lasix to 40 mg bid. Check echocardiogram. Will obtain  recent labs from PCP.  Weight loss: Unintentional, unexplained. She has h/o endometrial cancer. Recommend f/u w/PCP. Recommend stopping Aspirin. Risks outweigh benefits.   F/u in 3-4 weeks  Nigel Mormon, MD [facility] Pager: [phone] Office: [phone] If no answer Cell [phone]

## 2020-05-29 ENCOUNTER — Other Ambulatory Visit: Payer: Self-pay

## 2020-05-29 ENCOUNTER — Ambulatory Visit: Payer: Medicare HMO

## 2020-05-29 DIAGNOSIS — I5032 Chronic diastolic (congestive) heart failure: Secondary | ICD-10-CM

## 2020-06-20 ENCOUNTER — Ambulatory Visit: Payer: Medicare HMO | Admitting: Cardiology

## 2020-06-20 ENCOUNTER — Encounter: Payer: Self-pay | Admitting: Cardiology

## 2020-06-20 ENCOUNTER — Other Ambulatory Visit: Payer: Self-pay

## 2020-06-20 VITALS — BP 176/90 | HR 67 | Ht 62.0 in | Wt 162.0 lb

## 2020-06-20 DIAGNOSIS — I5032 Chronic diastolic (congestive) heart failure: Secondary | ICD-10-CM

## 2020-06-20 DIAGNOSIS — I10 Essential (primary) hypertension: Secondary | ICD-10-CM

## 2020-06-20 NOTE — Progress Notes (Signed)
Patient referred by [name], NP for heart failure.   Subjective:   Brenda Alexander, female    DOB: 1933-07-24, 86 y.o.   MRN: [id number]   HPI  86 year old African-American female with uncontrolled hypertension, type 2 diabetes mellitus, history of endometrioid adenocarcinoma of the uterus now status post hysterectomy in 2019, history of blood loss anemia, HFpEF.  Patient is here with her daughter today. Leg edema has improved after increasing lasix dose. Exertional dyspnea has improved. Blood pressure elevated today, but usually better controlled at home.   Current Outpatient Medications on File Prior to Visit  Medication Sig Dispense Refill  . acetaminophen (TYLENOL) 500 MG tablet Take 1,000 mg by mouth every 6 (six) hours as needed (for pain).     . Calcium Carb-Cholecalciferol (CALCIUM + D3 PO) Take 1 tablet by mouth daily.    . clindamycin (CLEOCIN) 150 MG capsule     . cycloSPORINE (RESTASIS) 0.05 % ophthalmic emulsion Place 1 drop into both eyes 2 (two) times daily.    Marland Kitchen diltiazem (CARDIZEM CD) 240 MG 24 hr capsule TAKE 1 CAPSULE EVERY DAY 90 capsule 2  . docusate sodium (COLACE) 100 MG capsule Take 1 capsule (100 mg total) by mouth 2 (two) times daily as needed for mild constipation or moderate constipation. 30 capsule 2  . enalapril (VASOTEC) 20 MG tablet Take 20 mg by mouth 2 (two) times daily.  4  . ferrous sulfate 325 (65 FE) MG EC tablet Take 1 tablet (325 mg total) by mouth 2 (two) times daily. 60 tablet 3  . furosemide (LASIX) 40 MG tablet Take 40 mg by mouth in the morning and at bedtime.    . isosorbide mononitrate (ISMO,MONOKET) 20 MG tablet Take 20 mg by mouth daily.  4  . metoprolol succinate (TOPROL-XL) 50 MG 24 hr tablet Take 50 mg by mouth daily.  2  . omeprazole (PRILOSEC) 20 MG capsule     . potassium chloride SA (K-DUR,KLOR-CON) 20 MEQ tablet Take 20 mEq by mouth daily.    Marland Kitchen senna (SENOKOT) 8.6 MG TABS tablet Take 1 tablet by mouth daily.    . simvastatin  (ZOCOR) 20 MG tablet Take 20 mg by mouth at bedtime.     . traMADol (ULTRAM) 50 MG tablet Take 25 mg by mouth as needed.     . vitamin C (ASCORBIC ACID) 500 MG tablet Take 500 mg by mouth daily.     No current facility-administered medications on file prior to visit.    Cardiovascular studies:  Echocardiogram 05/29/2020:  Normal LV systolic function with visual EF 55-60%. Left ventricle cavity  is normal in size. Moderate left ventricular hypertrophy. Normal global  wall motion. Doppler evidence of grade II diastolic dysfunction, elevated  LAP.  Left atrial cavity is severely dilated.  Aortic valve sclerosis without stenosis. Mild (Grade I) aortic  regurgitation.  Mild (Grade I) mitral regurgitation.  Compared to prior study dated 08/26/2018 no significant changes expect DD  is now Grade 2 with elevated LAP compared to Grade 1 DD.  EKG 05/21/2020: Sinus rhythm 62 bpm Left ventricular hypertrophy Left axis deviation Decreasing R-wave progression   EKG 09/19/2019: Sinus tachycardia 90 bpm. Significant baseline artifact.  Echocardiogram 08/26/2018: Mild LVH. Hyperdynamic LV with apical/intracavitary peak gradient 73 mmHg. Grade 1 DD Severe LA dilatation Mild aortic stenosis RVSP 54 mmHg  Recent labs: 08/30/2018: H/H 10.6/35.2. MCV 82. Platelets 298   Review of Systems  Cardiovascular: Negative for chest pain, dyspnea on exertion, leg  swelling, palpitations and syncope.       Vitals:   06/20/20 1211  BP: (!) 176/90  Pulse: 67  SpO2: 100%    Objective:   Physical Exam Vitals and nursing note reviewed.  Constitutional:      Appearance: She is well-developed.  Neck:     Vascular: No JVD.  Cardiovascular:     Rate and Rhythm: Normal rate and regular rhythm.     Pulses: Intact distal pulses.     Heart sounds: Normal heart sounds. No murmur heard.   Pulmonary:     Effort: Pulmonary effort is normal.     Breath sounds: Normal breath sounds. No wheezing or rales.   Musculoskeletal:     Right lower leg: Edema (3+) present.     Left lower leg: Edema (3+) present.          Assessment & Recommendations:   86 year old African-American female with uncontrolled hypertension, type 2 diabetes mellitus, history of endometrioid adenocarcinoma of the uterus now status post hysterectomy in 2019, history of blood loss anemia, HFpEF.   Essential hypertension Elevated today. Arranged for remote patient monitoring through pur pharmacist Manuela Schwartz. Will obtain baseline labs from PCP. If SBP remains >150 mmHg and if renal function would allow, will add spironolactone.   HFpEF: Some improvement in leg edema with increased dose of lasix to 40 mg bid. Continue the same for now   F/u in 3 months  Chasey Dull Esther Hardy, MD [facility] Pager: [phone] Office: [phone] If no answer Cell [phone]

## 2020-07-02 ENCOUNTER — Ambulatory Visit: Payer: Medicare HMO | Admitting: Podiatry

## 2020-07-12 ENCOUNTER — Telehealth: Payer: Self-pay | Admitting: Pharmacist

## 2020-07-12 ENCOUNTER — Other Ambulatory Visit: Payer: Self-pay | Admitting: Cardiology

## 2020-07-12 DIAGNOSIS — I10 Essential (primary) hypertension: Secondary | ICD-10-CM

## 2020-07-12 MED ORDER — SPIRONOLACTONE 25 MG PO TABS: 25.0000 mg | ORAL_TABLET | Freq: Every day | ORAL | 1 refills | Status: DC

## 2020-07-12 NOTE — Telephone Encounter (Signed)
Reviewed recent remote BP readings with daughter, Michela Pitcher. Home BP readings continue to remain above goal. Avg BP readings over the past two weeks of 150/89. Pt reports that her lower extremity swelling has improved since lasix 40 mg BID dose increase. Still having mild edema per daughter. Requested recent labs from PCP. CMP WNL on 06/01/20 (Scr: 0.75, BUN:14, K:4.4, eGFR: 84). Per recent OV, pt open to trying spironolactone. May consider starting spironolactone 25 mg daily for lower extremity edema and HTN management. Pt agreeable to getting repeat labwork completed in 1-2 weeks.

## 2020-07-12 NOTE — Telephone Encounter (Signed)
Yes, we can start spironolactone 25 mg. Stopped potassium. Check BMP in 1 week.  Thanks MJP

## 2020-07-18 ENCOUNTER — Other Ambulatory Visit: Payer: Self-pay

## 2020-07-18 ENCOUNTER — Ambulatory Visit (INDEPENDENT_AMBULATORY_CARE_PROVIDER_SITE_OTHER): Payer: Medicare HMO | Admitting: Podiatry

## 2020-07-18 ENCOUNTER — Encounter: Payer: Self-pay | Admitting: Podiatry

## 2020-07-18 DIAGNOSIS — M2041 Other hammer toe(s) (acquired), right foot: Secondary | ICD-10-CM

## 2020-07-18 DIAGNOSIS — M79675 Pain in left toe(s): Secondary | ICD-10-CM

## 2020-07-18 DIAGNOSIS — B351 Tinea unguium: Secondary | ICD-10-CM

## 2020-07-18 DIAGNOSIS — E1142 Type 2 diabetes mellitus with diabetic polyneuropathy: Secondary | ICD-10-CM

## 2020-07-18 DIAGNOSIS — L84 Corns and callosities: Secondary | ICD-10-CM | POA: Diagnosis not present

## 2020-07-18 DIAGNOSIS — M79674 Pain in right toe(s): Secondary | ICD-10-CM

## 2020-07-18 DIAGNOSIS — M2042 Other hammer toe(s) (acquired), left foot: Secondary | ICD-10-CM

## 2020-07-25 NOTE — Progress Notes (Signed)
Subjective:  Patient ID: Brenda Alexander, female    DOB: 10/05/1932,  MRN: [id number]  86 y.o. female presents with at risk foot care with history of diabetic neuropathy and painful thick toenails that are difficult to trim. Pain interferes with ambulation. Aggravating factors include wearing enclosed shoe gear. Pain is relieved with periodic professional debridement..    Patient did not check blood glucose this morning and states she is not required to monitor blood glucose.  PCP: [name], NP and last visit was:   Review of Systems: Negative except as noted in the HPI.  Past Medical History:  Diagnosis Date  . Anemia   . Arthritis    knees, hands  . Coronary artery disease   . Diabetes mellitus without complication (HCC)    no meds  . Dyspnea 01/12/2018   w/anemia dx and hospital admit w/blood transfused   . 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 History of blood transfusion 01/12/2018   at [facility]  . Hyperlipemia   . Hypertension   . SVD (spontaneous vaginal delivery)    x 6 - 5 living children  . Uses walker    rollator  . Wears partial dentures    upper and lower partials   Past Surgical History:  Procedure Laterality Date  . COLONOSCOPY    . EYE SURGERY     cataracts removed right eye  . HYSTEROSCOPY WITH D & C N/A 05/06/2018   Procedure: DILATATION AND CURETTAGE /HYSTEROSCOPY;  Surgeon: [name], MD;  Location: [facility];  Service: Gynecology;  Laterality: N/A;  . LAPAROSCOPY N/A 08/26/2018   Procedure: LAPAROSCOPY DIAGNOSTIC;  Surgeon: [name], MD;  Location: [place];  Service: General;  Laterality: N/A;  . MULTIPLE TOOTH EXTRACTIONS    . ROBOTIC ASSISTED TOTAL HYSTERECTOMY WITH BILATERAL SALPINGO OOPHERECTOMY Bilateral 05/24/2018   Procedure: XI ROBOTIC ASSISTED TOTAL HYSTERECTOMY WITH BILATERAL SALPINGO OOPHORECTOMY;  Surgeon: [name], MD;  Location: [name];  Service: Gynecology;  Laterality: Bilateral;  . SENTINEL NODE  BIOPSY N/A 05/24/2018   Procedure: SENTINEL LYMP  NODE BIOPSY;  Surgeon: [name], MD;  Location: [name];  Service: Gynecology;  Laterality: N/A;   Patient Active Problem List   Diagnosis Date Noted  . Chronic heart failure with preserved ejection fraction ([place]) 10/27/2018  . Frequent falls 10/27/2018  . SBO (small bowel obstruction) ([place]) 07/22/2018  . Leukocytosis 07/22/2018  . Hypokalemia 07/22/2018  . Type 2 diabetes mellitus ([place]) 07/22/2018  . 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 Physical deconditioning 07/22/2018  . 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 PMB (postmenopausal bleeding) 04/09/2018  . Bladder mass   . Anemia 01/11/2018  . Essential hypertension 01/11/2018  . Coronary artery disease 01/11/2018    Current Outpatient Medications:  .  acetaminophen (TYLENOL) 500 MG tablet, Take 1,000 mg by mouth every 6 (six) hours as needed (for pain). , Disp: , Rfl:  .  Calcium Carb-Cholecalciferol (CALCIUM + D3 PO), Take 1 tablet by mouth daily., Disp: , Rfl:  .  clindamycin (CLEOCIN) 150 MG capsule, , Disp: , Rfl:  .  cycloSPORINE (RESTASIS) 0.05 % ophthalmic emulsion, Place 1 drop into both eyes 2 (two) times daily., Disp: , Rfl:  .  diltiazem (CARDIZEM CD) 240 MG 24 hr capsule, TAKE 1 CAPSULE EVERY DAY, Disp: 90 capsule, Rfl: 2 .  docusate sodium (COLACE) 100 MG capsule, Take 1 capsule (100 mg total) by mouth 2 (two) times daily as needed for mild constipation or moderate constipation.,  Disp: 30 capsule, Rfl: 2 .  enalapril (VASOTEC) 20 MG tablet, Take 20 mg by mouth 2 (two) times daily., Disp: , Rfl: 4 .  furosemide (LASIX) 40 MG tablet, Take 40 mg by mouth in the morning and at bedtime., Disp: , Rfl:  .  isosorbide mononitrate (ISMO,MONOKET) 20 MG tablet, Take 20 mg by mouth daily., Disp: , Rfl: 4 .  metoprolol succinate (TOPROL-XL) 50 MG 24 hr tablet, Take 50 mg by mouth daily., Disp: , Rfl: 2 .  omeprazole (PRILOSEC) 20 MG capsule, , Disp: , Rfl:  .   senna (SENOKOT) 8.6 MG TABS tablet, Take 1 tablet by mouth daily., Disp: , Rfl:  .  simvastatin (ZOCOR) 20 MG tablet, Take 20 mg by mouth at bedtime. , Disp: , Rfl:  .  spironolactone (ALDACTONE) 25 MG tablet, TAKE 1 TABLET(25 MG) BY MOUTH DAILY, Disp: 90 tablet, Rfl: 1 .  traMADol (ULTRAM) 50 MG tablet, Take 25 mg by mouth as needed. , Disp: , Rfl:  .  vitamin C (ASCORBIC ACID) 500 MG tablet, Take 500 mg by mouth daily., Disp: , Rfl:  .  ferrous sulfate 325 (65 FE) MG EC tablet, Take 1 tablet (325 mg total) by mouth 2 (two) times daily., Disp: 60 tablet, Rfl: 3 Allergies  Allergen Reactions  . Other Anaphylaxis and Swelling    Walnuts = Throat swells  . Penicillins Swelling and Rash    Has patient had a PCN reaction causing immediate rash, facial/tongue/throat swelling, SOB or lightheadedness with hypotension: Yes Has patient had a PCN reaction causing severe rash involving mucus membranes or skin necrosis: Unk Has patient had a PCN reaction that required hospitalization: Unk Has patient had a PCN reaction occurring within the last 10 years: No If all of the above answers are "NO", then may proceed with Cephalosporin use.    Social History   Tobacco Use  Smoking Status Never Smoker  Smokeless Tobacco Never Used    Objective:  There were no vitals filed for this visit. Constitutional Patient is a pleasant 86 y.o. African American female WD, WN in NAD. AAO x 3.  Vascular Capillary fill time to digits <3 seconds b/l lower extremities. Faintly palpable DP pulse(s) b/l lower extremities. Nonpalpable PT pulse(s) b/l lower extremities. Pedal hair absent. Lower extremity skin temperature gradient within normal limits. Nonpitting edema noted b/l lower extremities. No cyanosis or clubbing noted.  Neurologic Normal speech. Protective sensation intact 5/5 intact bilaterally with 10g monofilament b/l. Vibratory sensation diminished b/l.  Dermatologic Pedal skin with normal turgor, texture and tone  bilaterally. No open wounds bilaterally. No interdigital macerations bilaterally. Toenails 1-5 b/l elongated, discolored, dystrophic, thickened, crumbly with subungual debris and tenderness to dorsal palpation. Hyperkeratotic lesion(s) L 3rd toe and L 5th toe.  No erythema, no edema, no drainage, no fluctuance.  Orthopedic: Normal muscle strength 5/5 to all lower extremity muscle groups bilaterally. No pain crepitus or joint limitation noted with ROM b/l. Hammertoes noted to the 2-5 bilaterally.   No flowsheet data found.     Assessment:   1. Pain due to onychomycosis of toenails of both feet   2. Corns   3. Acquired hammertoes of both feet   4. Diabetic peripheral neuropathy associated with type 2 diabetes mellitus ([place])    Plan:  Patient was evaluated and treated and all questions answered.  Onychomycosis with pain -Nails palliatively debridement as below. -Educated on self-care  Procedure: Nail Debridement Rationale: Pain Type of Debridement: manual, sharp debridement. Instrumentation: Nail  nipper, rotary burr. Number of Nails: 10  -Examined patient. -Continue diabetic foot care principles. -Patient to continue soft, supportive shoe gear daily. Start procedure for diabetic shoes. Patient qualifies based on diagnoses. -Toenails 1-5 b/l were debrided in length and girth with sterile nail nippers and dremel without iatrogenic bleeding.  -Corn(s) L 2nd toe and L 5th toe pared utilizing sterile scalpel blade without complication or incident. Total number debrided=2. -Patient to report any pedal injuries to medical professional immediately. -Patient/POA to call should there be question/concern in the interim.  Return in about 3 months (around 10/16/2020).  Freddie Breech, DPM

## 2020-08-03 ENCOUNTER — Other Ambulatory Visit (HOSPITAL_COMMUNITY): Payer: Self-pay | Admitting: Cardiology

## 2020-08-04 LAB — BASIC METABOLIC PANEL
BUN/Creatinine Ratio: 27 (ref 12–28)
BUN: 33 mg/dL — ABNORMAL HIGH (ref 8–27)
CO2: 26 mmol/L (ref 20–29)
Calcium: 10.1 mg/dL (ref 8.7–10.3)
Chloride: 102 mmol/L (ref 96–106)
Creatinine, Ser: 1.21 mg/dL — ABNORMAL HIGH (ref 0.57–1.00)
GFR calc Af Amer: 47 mL/min/{1.73_m2} — ABNORMAL LOW (ref >59)
GFR calc non Af Amer: 41 mL/min/{1.73_m2} — ABNORMAL LOW (ref >59)
Glucose: 99 mg/dL (ref 65–99)
Potassium: 6.2 mmol/L — ABNORMAL HIGH (ref 3.5–5.2)
Sodium: 141 mmol/L (ref 134–144)

## 2020-08-06 ENCOUNTER — Encounter (HOSPITAL_COMMUNITY): Payer: Self-pay

## 2020-08-06 ENCOUNTER — Emergency Department (HOSPITAL_COMMUNITY)
Admission: EM | Admit: 2020-08-06 | Discharge: 2020-08-06 | Disposition: A | Discharge status: Home / Self Care | Payer: Medicare HMO | Attending: Emergency Medicine | Admitting: Emergency Medicine

## 2020-08-06 ENCOUNTER — Other Ambulatory Visit: Payer: Self-pay

## 2020-08-06 DIAGNOSIS — I110 Hypertensive heart disease with heart failure: Secondary | ICD-10-CM | POA: Insufficient documentation

## 2020-08-06 DIAGNOSIS — I5032 Chronic diastolic (congestive) heart failure: Secondary | ICD-10-CM | POA: Diagnosis not present

## 2020-08-06 DIAGNOSIS — E785 Hyperlipidemia, unspecified: Secondary | ICD-10-CM | POA: Diagnosis not present

## 2020-08-06 DIAGNOSIS — Z8542 Personal history of malignant neoplasm of other parts of uterus: Secondary | ICD-10-CM | POA: Diagnosis not present

## 2020-08-06 DIAGNOSIS — E1169 Type 2 diabetes mellitus with other specified complication: Secondary | ICD-10-CM | POA: Diagnosis not present

## 2020-08-06 DIAGNOSIS — I2510 Atherosclerotic heart disease of native coronary artery without angina pectoris: Secondary | ICD-10-CM | POA: Diagnosis not present

## 2020-08-06 DIAGNOSIS — Z79899 Other long term (current) drug therapy: Secondary | ICD-10-CM | POA: Insufficient documentation

## 2020-08-06 DIAGNOSIS — R55 Syncope and collapse: Secondary | ICD-10-CM | POA: Diagnosis not present

## 2020-08-06 LAB — BASIC METABOLIC PANEL
Anion gap: 9 (ref 5–15)
BUN: 40 mg/dL — ABNORMAL HIGH (ref 8–23)
CO2: 25 mmol/L (ref 22–32)
Calcium: 9.4 mg/dL (ref 8.9–10.3)
Chloride: 102 mmol/L (ref 98–111)
Creatinine, Ser: 1.31 mg/dL — ABNORMAL HIGH (ref 0.44–1.00)
GFR, Estimated: 40 mL/min — ABNORMAL LOW (ref >60)
Glucose, Bld: 108 mg/dL — ABNORMAL HIGH (ref 70–99)
Potassium: 5.1 mmol/L (ref 3.5–5.1)
Sodium: 136 mmol/L (ref 135–145)

## 2020-08-06 LAB — URINALYSIS, ROUTINE W REFLEX MICROSCOPIC
Bilirubin Urine: NEGATIVE
Glucose, UA: NEGATIVE mg/dL
Hgb urine dipstick: NEGATIVE
Ketones, ur: NEGATIVE mg/dL
Leukocytes,Ua: NEGATIVE
Nitrite: NEGATIVE
Protein, ur: NEGATIVE mg/dL
Specific Gravity, Urine: 1.013 (ref 1.005–1.030)
pH: 6 (ref 5.0–8.0)

## 2020-08-06 LAB — CBC WITH DIFFERENTIAL/PLATELET
Abs Immature Granulocytes: 0 10*3/uL (ref 0.00–0.07)
Basophils Absolute: 0 10*3/uL (ref 0.0–0.1)
Basophils Relative: 1 %
Eosinophils Absolute: 0 10*3/uL (ref 0.0–0.5)
Eosinophils Relative: 1 %
HCT: 34.2 % — ABNORMAL LOW (ref 36.0–46.0)
Hemoglobin: 11 g/dL — ABNORMAL LOW (ref 12.0–15.0)
Immature Granulocytes: 0 %
Lymphocytes Relative: 15 %
Lymphs Abs: 0.5 10*3/uL — ABNORMAL LOW (ref 0.7–4.0)
MCH: 28.1 pg (ref 26.0–34.0)
MCHC: 32.2 g/dL (ref 30.0–36.0)
MCV: 87.2 fL (ref 80.0–100.0)
Monocytes Absolute: 0.5 10*3/uL (ref 0.1–1.0)
Monocytes Relative: 15 %
Neutro Abs: 2.5 10*3/uL (ref 1.7–7.7)
Neutrophils Relative %: 68 %
Platelets: 203 10*3/uL (ref 150–400)
RBC: 3.92 MIL/uL (ref 3.87–5.11)
RDW: 14.1 % (ref 11.5–15.5)
WBC: 3.6 10*3/uL — ABNORMAL LOW (ref 4.0–10.5)
nRBC: 0 % (ref 0.0–0.2)

## 2020-08-06 NOTE — ED Notes (Addendum)
Pt normally ambulates with a walker at home

## 2020-08-06 NOTE — ED Notes (Signed)
Pt ambulated with assist to bathroom and back.

## 2020-08-06 NOTE — ED Triage Notes (Signed)
EMS reports from home, while on tele visit with PCP, Pt had syncopal episode, no seizure, no fall.  BP 112/68 HR 52 RR 15 Sp02 96 RA CBG 154 Temp 97.7  18 L forearm 118ml NS

## 2020-08-06 NOTE — ED Provider Notes (Signed)
[place] DEPT Provider Note   CSN: [id number] Arrival date & time: 08/06/20  1213     History Chief Complaint  Patient presents with  . Loss of Consciousness    Brenda Alexander is a 87 y.o. female.  HPI She presents for an episode of syncope, that occurred while she was having a televideo visit with her primary care doctor today.  This was a routine evaluation, for ongoing care.  Patient does not recall feeling abnormal before hand but does recall talking to the provider after the event.  Patient denies recent problems including vomiting, fever, cough or weakness.  She is taking her usual medications.  The patient states that she lives alone, has an aide come in frequently and sees a nurse, from home health, occasionally.  There are no other known modifying factors.    Past Medical History:  Diagnosis Date  . Anemia   . Arthritis    knees, hands  . Coronary artery disease   . Diabetes mellitus without complication (HCC)    no meds  . Dyspnea 01/12/2018   w/anemia dx and hospital admit w/blood transfused   . 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 History of blood transfusion 01/12/2018   at [facility]  . Hyperlipemia   . Hypertension   . SVD (spontaneous vaginal delivery)    x 6 - 5 living children  . Uses walker    rollator  . Wears partial dentures    upper and lower partials    Patient Active Problem List   Diagnosis Date Noted  . Chronic heart failure with preserved ejection fraction ([place]) 10/27/2018  . Frequent falls 10/27/2018  . SBO (small bowel obstruction) ([place]) 07/22/2018  . Leukocytosis 07/22/2018  . Hypokalemia 07/22/2018  . Type 2 diabetes mellitus ([place]) 07/22/2018  . 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 Physical deconditioning 07/22/2018  . 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 PMB (postmenopausal bleeding) 04/09/2018  . Bladder mass   . Anemia 01/11/2018  . Essential  hypertension 01/11/2018  . Coronary artery disease 01/11/2018    Past Surgical History:  Procedure Laterality Date  . COLONOSCOPY    . EYE SURGERY     cataracts removed right eye  . HYSTEROSCOPY WITH D & C N/A 05/06/2018   Procedure: DILATATION AND CURETTAGE /HYSTEROSCOPY;  Surgeon: [name], MD;  Location: [facility];  Service: Gynecology;  Laterality: N/A;  . LAPAROSCOPY N/A 08/26/2018   Procedure: LAPAROSCOPY DIAGNOSTIC;  Surgeon: [name], MD;  Location: [place];  Service: General;  Laterality: N/A;  . MULTIPLE TOOTH EXTRACTIONS    . ROBOTIC ASSISTED TOTAL HYSTERECTOMY WITH BILATERAL SALPINGO OOPHERECTOMY Bilateral 05/24/2018   Procedure: XI ROBOTIC ASSISTED TOTAL HYSTERECTOMY WITH BILATERAL SALPINGO OOPHORECTOMY;  Surgeon: [name], MD;  Location: [name];  Service: Gynecology;  Laterality: Bilateral;  . SENTINEL NODE BIOPSY N/A 05/24/2018   Procedure: SENTINEL LYMP  NODE BIOPSY;  Surgeon: [name], MD;  Location: [name];  Service: Gynecology;  Laterality: N/A;     OB History    Gravida  6   Para  6   Term  6   Preterm      AB      Living  5     SAB      IAB      Ectopic      Multiple      Live Births  5        Obstetric Comments  Had term  stillbirthx1        Family History  Problem Relation Age of Onset  . Obesity Son   . Hypertension Father   . Stroke Father   . Hypertension Sister   . Heart failure Sister   . Hypertension Brother   . Hypertension Sister   . Hypertension Sister     Social History   Tobacco Use  . Smoking status: Never Smoker  . Smokeless tobacco: Never Used  Vaping Use  . Vaping Use: Never used  Substance Use Topics  . Alcohol use: Not Currently  . Drug use: Never    Home Medications Prior to Admission medications   Medication Sig Start Date End Date Taking? Authorizing Provider  acetaminophen (TYLENOL) 500 MG tablet Take 1,000 mg by mouth every 6 (six) hours as needed (for pain).     [provider]  Calcium Carb-Cholecalciferol (CALCIUM + D3 PO) Take 1 tablet by mouth daily.    [provider]  clindamycin (CLEOCIN) 150 MG capsule  09/09/19   [provider]  cycloSPORINE (RESTASIS) 0.05 % ophthalmic emulsion Place 1 drop into both eyes 2 (two) times daily.    [provider]  diltiazem (CARDIZEM CD) 240 MG 24 hr capsule TAKE 1 CAPSULE EVERY DAY 05/04/20   Patwardhan, Manish J, MD  docusate sodium (COLACE) 100 MG capsule Take 1 capsule (100 mg total) by mouth 2 (two) times daily as needed for mild constipation or moderate constipation. 05/06/18   Anyanwu, Sallyanne Havers, MD  enalapril (VASOTEC) 20 MG tablet Take 20 mg by mouth 2 (two) times daily. 12/23/17   [provider]  ferrous sulfate 325 (65 FE) MG EC tablet Take 1 tablet (325 mg total) by mouth 2 (two) times daily. 01/13/18 06/20/20  Elgergawy, Silver Huguenin, MD  isosorbide mononitrate (ISMO,MONOKET) 20 MG tablet Take 20 mg by mouth daily. 12/23/17   [provider]  metoprolol succinate (TOPROL-XL) 50 MG 24 hr tablet Take 50 mg by mouth daily. 12/23/17   [provider]  omeprazole (PRILOSEC) 20 MG capsule  03/01/20   [provider]  senna (SENOKOT) 8.6 MG TABS tablet Take 1 tablet by mouth daily.    [provider]  simvastatin (ZOCOR) 20 MG tablet Take 20 mg by mouth at bedtime.     [provider]  traMADol (ULTRAM) 50 MG tablet Take 25 mg by mouth as needed.  03/01/20   [provider]  vitamin C (ASCORBIC ACID) 500 MG tablet Take 500 mg by mouth daily.    [provider]  furosemide (LASIX) 40 MG tablet Take 40 mg by mouth in the morning and at bedtime.  08/06/20  [provider]  spironolactone (ALDACTONE) 25 MG tablet TAKE 1 TABLET(25 MG) BY MOUTH DAILY 07/12/20 08/06/20  Patwardhan, Reynold Bowen, MD    Allergies    Other and Penicillins  Review of Systems   Review of Systems  All other systems reviewed and are  negative.   Physical Exam Updated Vital Signs BP (!) 165/94   Pulse 71   Temp 97.6 F (36.4 C)   Resp 18   LMP  (LMP Unknown)   SpO2 99%   Physical Exam Vitals and nursing note reviewed.  Constitutional:      General: She is not in acute distress.    Appearance: She is well-developed and well-nourished. She is not ill-appearing, toxic-appearing or diaphoretic.  HENT:     Head: Normocephalic and atraumatic.     Right  Ear: External ear normal.     Left Ear: External ear normal.  Eyes:     Extraocular Movements: EOM normal.     Conjunctiva/sclera: Conjunctivae normal.     Pupils: Pupils are equal, round, and reactive to light.  Neck:     Trachea: Phonation normal.  Cardiovascular:     Rate and Rhythm: Normal rate and regular rhythm.     Heart sounds: Normal heart sounds.  Pulmonary:     Effort: Pulmonary effort is normal.     Breath sounds: Normal breath sounds.  Chest:     Chest wall: No bony tenderness.  Abdominal:     General: There is no distension.     Palpations: Abdomen is soft.     Tenderness: There is no abdominal tenderness.  Musculoskeletal:        General: Normal range of motion.     Cervical back: Normal range of motion and neck supple.  Skin:    General: Skin is warm, dry and intact.  Neurological:     Mental Status: She is alert and oriented to person, place, and time.     Cranial Nerves: No cranial nerve deficit.     Sensory: No sensory deficit.     Motor: No abnormal muscle tone.     Coordination: Coordination normal.  Psychiatric:        Mood and Affect: Mood and affect and mood normal.        Behavior: Behavior normal.        Thought Content: Thought content normal.        Judgment: Judgment normal.     ED Results / Procedures / Treatments   Labs (all labs ordered are listed, but only abnormal results are displayed) Labs Reviewed  BASIC METABOLIC PANEL - Abnormal; Notable for the following components:      Result Value   Glucose, Bld  108 (*)    BUN 40 (*)    Creatinine, Ser 1.31 (*)    GFR, Estimated 40 (*)    All other components within normal limits  CBC WITH DIFFERENTIAL/PLATELET - Abnormal; Notable for the following components:   WBC 3.6 (*)    Hemoglobin 11.0 (*)    HCT 34.2 (*)    Lymphs Abs 0.5 (*)    All other components within normal limits  URINALYSIS, ROUTINE W REFLEX MICROSCOPIC    EKG EKG Interpretation  Date/Time:  Monday August 06 2020 13:52:21 EST Ventricular Rate:  53 PR Interval:  238 QRS Duration: 80 QT Interval:  482 QTC Calculation: 452 R Axis:   5 Text Interpretation: Sinus bradycardia with 1st degree A-V block Otherwise normal ECG Since last tracing rate slower Otherwise no significant change Confirmed by Daleen Bo [phone]) on 08/06/2020 2:06:06 PM   Radiology No results found.  Procedures Procedures (including critical care time)  Medications Ordered in ED Medications - No data to display  ED Course  I have reviewed the triage vital signs and the nursing notes.  Pertinent labs & imaging results that were available during my care of the patient were reviewed by me and considered in my medical decision making (see chart for details).  Clinical Course as of 08/06/20 1500  Mon Aug 06, 2020  1305 Discussion with [name], NP; BP was low 80/46 during a routine televisit this morning. Pt was with daughter in law but would not respond, when the daughter-in-law spoke to the patient. Also did not respond to provider, when she spoke directly to  the patient.  The patient has been on diuretics, possibly now taking both spironolactone and lasix.  She recently had a visit with cardiology but there is no available data from that visit according to Ms. Stowe.  The provider does not have any other concerns about the patient's health at this time. [EW]  1434 Orthostatic blood pressure and pulses were negative, going from supine to standing. [EW]  1454 I communicated with her physician,  Dr. Rhodia Albright; he started her on spironolactone about a week and a half ago.  He is comfortable with her being discharged, recommend stopping spironolactone and Lasix, and he will follow-up in the office tomorrow.  He can schedule her for cardiac monitoring if needed. [EW]    Clinical Course User Index [EW] Daleen Bo, MD   MDM Rules/Calculators/A&P                           Patient Vitals for the past 24 hrs:  BP Temp Pulse Resp SpO2  08/06/20 1435 (!) 165/94 - 71 18 99 %  08/06/20 1433 (!) 165/94 - 71 18 99 %  08/06/20 1432 (!) 151/73 - 63 18 99 %  08/06/20 1431 138/70 - (!) 59 16 100 %  08/06/20 1237 110/66 97.6 F (36.4 C) (!) 58 16 99 %  08/06/20 1234 118/70 97.7 F (36.5 C) 66 16 96 %    2:59 PM Reevaluation with update and discussion. After initial assessment and treatment, an updated evaluation reveals no change in clinical status.  Patient's daughter at the bedside.  Findings discussed and questions answered. Daleen Bo   Medical Decision Making:  This patient is presenting for evaluation of an episode of syncope, which does require a range of treatment options, and is a complaint that involves a high risk of morbidity and mortality. The differential diagnoses include acute illness including cardiopulmonary instability, metabolic disorder and infection. I decided to review old records, and in summary elderly female living at home, had a syncopal episode, observed by family member and her primary care provider who was visiting her with a telemetry video monitor..  I did not require additional historical information from anyone.  Clinical Laboratory Tests Ordered, included CBC and Metabolic panel. Review indicates white count low, hemoglobin low, glucose, BMP, creatinine, GFR low.   Cardiac Monitor Tracing which shows normal sinus rhythm   Critical Interventions-clinical evaluation, laboratory testing, cardiac monitoring, orthostatic blood pressure and pulses,  observation and reassessment  After These Interventions, the Patient was reevaluated and was found stable for discharge.  Patient appears to be mildly volume depleted, with creatinine higher than baseline from a year ago but roughly the same as it was 3 days ago.  Mild potassium elevation, 6.13 days ago, borderline abnormal today at 5.1.  Patient is nontoxic, not vomiting and was recently started on spironolactone.  Plan discharge with close follow-up with cardiology, hold spironolactone and Lasix.  Patient already has stopped taking potassium.  Syncope without cardiovascular instability.  CRITICAL CARE-no Performed by: Daleen Bo  Nursing Notes Reviewed/ Care Coordinated Applicable Imaging Reviewed Interpretation of Laboratory Data incorporated into ED treatment  The patient appears reasonably screened and/or stabilized for discharge and I doubt any other medical condition or other [facility] requiring further screening, evaluation, or treatment in the ED at this time prior to discharge.  Plan: Home Medications-continue usual except spironolactone and Lasix; Home Treatments-regular diet and fluids; return here if the recommended treatment, does not improve the symptoms;  Recommended follow up-PCP, as needed.  See cardiology tomorrow as scheduled.     Final Clinical Impression(s) / ED Diagnoses Final diagnoses:  None    Rx / DC Orders ED Discharge Orders    None       Daleen Bo, MD 08/06/20 [phone]

## 2020-08-06 NOTE — Discharge Instructions (Addendum)
Do not take spironolactone and Lasix, now.  Also do not take potassium.  Call your cardiologist for an appointment tomorrow afternoon to be seen for checkup.

## 2020-08-06 NOTE — Progress Notes (Signed)
Noted to have K 6.2-->5.1 Cr 1.21-->1.31. patient was seen by Dr. Eulis Foster today in the ED for possible loss of consciousness. Stop lasix and spironolactone for now. I will see her follow up tomorrow afternoon.  Nigel Mormon, MD Pager: [phone] Office: [phone]

## 2020-08-07 ENCOUNTER — Ambulatory Visit: Payer: Medicare HMO | Admitting: Cardiology

## 2020-08-07 ENCOUNTER — Encounter: Payer: Self-pay | Admitting: Cardiology

## 2020-08-07 VITALS — BP 139/73 | HR 75 | Resp 16 | Ht 62.0 in | Wt 175.6 lb

## 2020-08-07 DIAGNOSIS — I5032 Chronic diastolic (congestive) heart failure: Secondary | ICD-10-CM

## 2020-08-07 DIAGNOSIS — I10 Essential (primary) hypertension: Secondary | ICD-10-CM

## 2020-08-07 DIAGNOSIS — N179 Acute kidney failure, unspecified: Secondary | ICD-10-CM | POA: Insufficient documentation

## 2020-08-07 MED ORDER — SPIRONOLACTONE 25 MG PO TABS: 25.0000 mg | ORAL_TABLET | Freq: Every day | ORAL | 3 refills | Status: DC

## 2020-08-07 NOTE — Progress Notes (Signed)
Patient was seen today.

## 2020-08-07 NOTE — Progress Notes (Signed)
Patient referred by [name], NP for heart failure.   Subjective:   Brenda Alexander, female    DOB: [birth year], 87 y.o.   MRN: [id number]   HPI  87 year old African-American female with uncontrolled hypertension, type 2 diabetes mellitus, history of endometrioid adenocarcinoma of the uterus now status post hysterectomy in 2019, history of blood loss anemia, HFpEF.  At last visit in 06/2020, I had added spironolactone for management of hypertensin and HFpEF. She apparently was having a virtual visit with her PCP is when she lost consciousness and was taken to the emergency room. Around the same time, I was notified about her K of 6.2 on f/u BMP. I personally spoke with emergency room physician Dr. Eulis Foster. f/u BMP in the ER showed K of 5.2. I asked them to hold both spironolactone and lasix. Patient is here today for follow up visit.  History obtained from daughter. Patient was in a video visit, is when she suddenly felt lightheaded, became hypotensive to 60/40 mmhg. BP correlated with that noted on remote patient monitoring.     Current Outpatient Medications on File Prior to Visit  Medication Sig Dispense Refill   acetaminophen (TYLENOL) 500 MG tablet Take 1,000 mg by mouth every 6 (six) hours as needed (for pain).      Calcium Carb-Cholecalciferol (CALCIUM + D3 PO) Take 1 tablet by mouth daily.     cycloSPORINE (RESTASIS) 0.05 % ophthalmic emulsion Place 1 drop into both eyes 2 (two) times daily.     diltiazem (CARDIZEM CD) 240 MG 24 hr capsule TAKE 1 CAPSULE EVERY DAY 90 capsule 2   docusate sodium (COLACE) 100 MG capsule Take 1 capsule (100 mg total) by mouth 2 (two) times daily as needed for mild constipation or moderate constipation. 30 capsule 2   enalapril (VASOTEC) 20 MG tablet Take 20 mg by mouth 2 (two) times daily.  4   ferrous sulfate 325 (65 FE) MG EC tablet Take 1 tablet (325 mg total) by mouth 2 (two) times daily. 60 tablet 3   isosorbide mononitrate (ISMO,MONOKET) 20 MG  tablet Take 20 mg by mouth daily.  4   metoprolol succinate (TOPROL-XL) 50 MG 24 hr tablet Take 50 mg by mouth daily.  2   omeprazole (PRILOSEC) 20 MG capsule      senna (SENOKOT) 8.6 MG TABS tablet Take 1 tablet by mouth daily.     simvastatin (ZOCOR) 20 MG tablet Take 20 mg by mouth at bedtime.      traMADol (ULTRAM) 50 MG tablet Take 25 mg by mouth as needed.      vitamin C (ASCORBIC ACID) 500 MG tablet Take 500 mg by mouth daily.     [DISCONTINUED] furosemide (LASIX) 40 MG tablet Take 40 mg by mouth in the morning and at bedtime.     [DISCONTINUED] spironolactone (ALDACTONE) 25 MG tablet TAKE 1 TABLET(25 MG) BY MOUTH DAILY 90 tablet 1   No current facility-administered medications on file prior to visit.    Cardiovascular studies:  Echocardiogram 05/29/2020:  Normal LV systolic function with visual EF 55-60%. Left ventricle cavity  is normal in size. Moderate left ventricular hypertrophy. Normal global  wall motion. Doppler evidence of grade II diastolic dysfunction, elevated  LAP.  Left atrial cavity is severely dilated.  Aortic valve sclerosis without stenosis. Mild (Grade I) aortic  regurgitation.  Mild (Grade I) mitral regurgitation.  Compared to prior study dated 08/26/2018 no significant changes expect DD  is now Grade 2 with elevated  LAP compared to Grade 1 DD.  EKG 05/21/2020: Sinus rhythm 62 bpm Left ventricular hypertrophy Left axis deviation Decreasing R-wave progression   EKG 09/19/2019: Sinus tachycardia 90 bpm. Significant baseline artifact.  Echocardiogram 08/26/2018: Mild LVH. Hyperdynamic LV with apical/intracavitary peak gradient 73 mmHg. Grade 1 DD Severe LA dilatation Mild aortic stenosis RVSP 54 mmHg  Recent labs: 08/30/2018: H/H 10.6/35.2. MCV 82. Platelets 298   Review of Systems  Cardiovascular: Negative for chest pain, dyspnea on exertion, leg swelling, palpitations and syncope.       Vitals:   08/07/20 0958 08/07/20 1010  BP: (!) 194/97  139/73  Pulse: 71 75  Resp: 16   SpO2: 97% 97%    Objective:   Physical Exam Vitals and nursing note reviewed.  Constitutional:      Appearance: She is well-developed.  Neck:     Vascular: No JVD.  Cardiovascular:     Rate and Rhythm: Normal rate and regular rhythm.     Pulses: Intact distal pulses.     Heart sounds: Normal heart sounds. No murmur heard.   Pulmonary:     Effort: Pulmonary effort is normal.     Breath sounds: Normal breath sounds. No wheezing or rales.  Musculoskeletal:     Right lower leg: Edema (3+) present.     Left lower leg: Edema (3+) present.          Assessment & Recommendations:   87 year old African-American female with uncontrolled hypertension, type 2 diabetes mellitus, history of endometrioid adenocarcinoma of the uterus now status post hysterectomy in 2019, history of blood loss anemia, HFpEF.   Syncope: Most likely due to hypotension in the setting of spironolactone, enalapril, diltiazem. Prior ot this episode, spironolactone addition ahd led to significant improvement in BP readings. I suspect her AKI and hypotension was due to concurrent use of enalapril, spironolacotne, and lasix. Stop enalapril. Take lasix only as needed. Start spironolactone 25 mg back on 08/10/2019. Check BMP next week. Encourage liberal hydration.   Essential hypertension\ See above  HFpEF: Improved   F/u on 08/20/2020.  Nigel Mormon, MD [facility] [place] Pager: [phone] Office: [phone] If no answer Cell [phone]

## 2020-08-13 ENCOUNTER — Other Ambulatory Visit: Payer: Medicare HMO | Admitting: Orthotics

## 2020-08-20 ENCOUNTER — Ambulatory Visit: Payer: Medicare HMO | Admitting: Cardiology

## 2020-08-20 ENCOUNTER — Other Ambulatory Visit (HOSPITAL_COMMUNITY): Payer: Self-pay | Admitting: Cardiology

## 2020-08-21 LAB — BASIC METABOLIC PANEL
BUN/Creatinine Ratio: 19 (ref 12–28)
BUN: 23 mg/dL (ref 8–27)
CO2: 24 mmol/L (ref 20–29)
Calcium: 9.8 mg/dL (ref 8.7–10.3)
Chloride: 98 mmol/L (ref 96–106)
Creatinine, Ser: 1.2 mg/dL — ABNORMAL HIGH (ref 0.57–1.00)
GFR calc Af Amer: 48 mL/min/{1.73_m2} — ABNORMAL LOW (ref >59)
GFR calc non Af Amer: 41 mL/min/{1.73_m2} — ABNORMAL LOW (ref >59)
Glucose: 98 mg/dL (ref 65–99)
Potassium: 5.1 mmol/L (ref 3.5–5.2)
Sodium: 138 mmol/L (ref 134–144)

## 2020-08-27 ENCOUNTER — Encounter: Payer: Self-pay | Admitting: Cardiology

## 2020-08-27 ENCOUNTER — Other Ambulatory Visit: Payer: Self-pay

## 2020-08-27 ENCOUNTER — Telehealth: Payer: Medicare HMO | Admitting: Cardiology

## 2020-08-27 VITALS — BP 134/77 | HR 83

## 2020-08-27 DIAGNOSIS — I10 Essential (primary) hypertension: Secondary | ICD-10-CM

## 2020-08-27 DIAGNOSIS — I5032 Chronic diastolic (congestive) heart failure: Secondary | ICD-10-CM

## 2020-08-27 MED ORDER — SPIRONOLACTONE 25 MG PO TABS: 25.0000 mg | ORAL_TABLET | Freq: Every day | ORAL | 3 refills | Status: DC

## 2020-08-27 NOTE — Progress Notes (Signed)
Patient referred by [name], NP for heart failure.   Subjective:   Brenda Alexander, female    DOB: November 16, 1932, 87 y.o.   MRN: [id number]  I connected with the patient on 08/27/2020 by a video enabled telemedicine application and verified that I am speaking with the correct person using two identifiers.     I discussed the limitations of evaluation and management by telemedicine and the availability of in person appointments. The patient expressed understanding and agreed to proceed.   This visit type was conducted due to national recommendations for restrictions regarding the COVID-19 Pandemic (e.g. social distancing).  This format is felt to be most appropriate for this patient at this time.  All issues noted in this document were discussed and addressed.  No physical exam was performed (except for noted visual exam findings with Tele health visits).  The patient has consented to conduct a Tele health visit and understands insurance will be billed.   HPI  87 year old African-American female with uncontrolled hypertension, type 2 diabetes mellitus, history of endometrioid adenocarcinoma of the uterus now status post hysterectomy in 2019, history of blood loss anemia, HFpEF.  At last visit, we stopped enalapril, resumed spironolactone 25 mg. Labs reviewed.  Patient is feeling well. She has not had any syncopal/presyncopal episodes. Blood pressure is well controlled. Leg edema has improved.    Current Outpatient Medications on File Prior to Visit  Medication Sig Dispense Refill  . acetaminophen (TYLENOL) 500 MG tablet Take 1,000 mg by mouth every 6 (six) hours as needed (for pain).     . Calcium Carb-Cholecalciferol (CALCIUM + D3 PO) Take 1 tablet by mouth daily.    . clindamycin (CLEOCIN) 150 MG capsule Take 150 mg by mouth 3 (three) times daily.    . cycloSPORINE (RESTASIS) 0.05 % ophthalmic emulsion Place 1 drop into both eyes 2 (two) times daily.    Marland Kitchen diltiazem (CARDIZEM CD) 240 MG  24 hr capsule TAKE 1 CAPSULE EVERY DAY 90 capsule 2  . docusate sodium (COLACE) 100 MG capsule Take 1 capsule (100 mg total) by mouth 2 (two) times daily as needed for mild constipation or moderate constipation. 30 capsule 2  . ferrous sulfate 325 (65 FE) MG EC tablet Take 1 tablet (325 mg total) by mouth 2 (two) times daily. 60 tablet 3  . isosorbide mononitrate (ISMO,MONOKET) 20 MG tablet Take 20 mg by mouth daily.  4  . metoprolol succinate (TOPROL-XL) 50 MG 24 hr tablet Take 50 mg by mouth daily.  2  . omeprazole (PRILOSEC) 20 MG capsule     . senna (SENOKOT) 8.6 MG TABS tablet Take 1 tablet by mouth daily.    . simvastatin (ZOCOR) 20 MG tablet Take 20 mg by mouth at bedtime.     Marland Kitchen spironolactone (ALDACTONE) 25 MG tablet Take 1 tablet (25 mg total) by mouth daily. 30 tablet 3  . traMADol (ULTRAM) 50 MG tablet Take 25 mg by mouth as needed.     . vitamin C (ASCORBIC ACID) 500 MG tablet Take 500 mg by mouth daily.    . [DISCONTINUED] furosemide (LASIX) 40 MG tablet Take 40 mg by mouth as needed.     No current facility-administered medications on file prior to visit.    Cardiovascular studies:  Echocardiogram 05/29/2020:  Normal LV systolic function with visual EF 55-60%. Left ventricle cavity  is normal in size. Moderate left ventricular hypertrophy. Normal global  wall motion. Doppler evidence of grade II diastolic dysfunction, elevated  LAP.  Left atrial cavity is severely dilated.  Aortic valve sclerosis without stenosis. Mild (Grade I) aortic  regurgitation.  Mild (Grade I) mitral regurgitation.  Compared to prior study dated 08/26/2018 no significant changes expect DD  is now Grade 2 with elevated LAP compared to Grade 1 DD.  EKG 05/21/2020: Sinus rhythm 62 bpm Left ventricular hypertrophy Left axis deviation Decreasing R-wave progression   EKG 09/19/2019: Sinus tachycardia 90 bpm. Significant baseline artifact.  Echocardiogram 08/26/2018: Mild LVH. Hyperdynamic LV with  apical/intracavitary peak gradient 73 mmHg. Grade 1 DD Severe LA dilatation Mild aortic stenosis RVSP 54 mmHg  Recent labs: 08/06/2020: Glucose 108, BUN/Cr 40/1.31. EGFR 40. Na/K 136/5.1.  H/H 11/34. MCV 87. Platelets 203   08/30/2018: H/H 10.6/35.2. MCV 82. Platelets 298   Review of Systems  Cardiovascular: Positive for leg swelling (Improved). Negative for chest pain, dyspnea on exertion, palpitations and syncope.       Vitals:   08/27/20 1554  BP: 134/77  Pulse: 83    Objective:   Physical Exam Vitals and nursing note reviewed.  Constitutional:      General: She is not in acute distress.    Appearance: She is well-developed and well-nourished.  Pulmonary:     Effort: Pulmonary effort is normal.  Musculoskeletal:     Right lower leg: Edema (Trace) present.     Left lower leg: Edema (Trace) present.  Neurological:     Mental Status: She is alert and oriented to person, place, and time.  Psychiatric:        Mood and Affect: Mood and affect normal.          Assessment & Recommendations:   87 year old African-American female with uncontrolled hypertension, type 2 diabetes mellitus, history of endometrioid adenocarcinoma of the uterus now status post hysterectomy in 2019, history of blood loss anemia, HFpEF.   Syncope: No recurrence Encourage liberal hydration.   Essential hypertension: See above  HFpEF: Toelrating spironolactone well. Labs stable/ K 5..1. Avoid K rich foods like banana Arranged for remote patient monitoring through pur pharmacist Manuela Schwartz for weight checks  F/u in 3 months  [place], MD [facility] [place] Pager: [phone] Office: [phone] If no answer Cell [phone]

## 2020-09-23 ENCOUNTER — Encounter: Payer: Self-pay | Admitting: Pharmacist

## 2020-09-23 NOTE — Progress Notes (Signed)
CARE PLAN ENTRY  09/23/2020 Name: Brenda Alexander MRN: [id number] DOB: July 05, 1933  Brenda Alexander is enrolled in Remote Patient Monitoring/Principle Care Monitoring.  Date of Enrollment: 06/22/20 Supervising physician: Vernell Leep Indication: HTN  Remote Readings: {Compliant and Avg BP: 129/73, HR:70  Pharmacist Clinical Goal(s):  Marland Kitchen Over the next 90 days, patient will demonstrate Improved medication adherence as evidenced by medication fill history . Over the next 90 days, patient will work with PharmD to address needs related to hypertension management . Over the next 90 days, patient will demonstrate improved understanding of prescribed medications and rationale for usage as evidenced by patient teach back . Over the next 90 days, patient will experience decrease in ED visits. ED visits in last 6 months = 1 . Over the next 90 days, patient will not experience hospital admission. Hospital Admissions in last 6 months = 0  Interventions: . Provider and Inter-disciplinary care team collaboration (see longitudinal plan of care) . Comprehensive medication review performed. . Discussed plans with patient for ongoing care management follow up and provided patient with direct contact information for care management team . Collaboration with provider re: medication management  Patient Self Care Activities:  . Attends all scheduled provider appointments . Calls pharmacy for medication refills . Performs ADL's independently . Performs IADL's independently . Calls provider office for new concerns or questions   Initial goal documentation and Please see past updates related to this goal by clicking on the "Past Updates" button in the selected goal  Patient Active Problem List   Diagnosis Date Noted  . AKI (acute kidney injury) ([place]) 08/07/2020  . Chronic heart failure with preserved ejection fraction ([place]) 10/27/2018  . Frequent falls 10/27/2018  . SBO (small bowel obstruction) ([place])  07/22/2018  . Leukocytosis 07/22/2018  . Hypokalemia 07/22/2018  . Type 2 diabetes mellitus ([place]) 07/22/2018  . 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 Physical deconditioning 07/22/2018  . Endometrioid adenocarcinoma of uterus ([place]) 05/13/2018  . PMB (postmenopausal bleeding) 04/09/2018  . Bladder mass   . Anemia 01/11/2018  . Essential hypertension 01/11/2018  . Coronary artery disease 01/11/2018   Past Surgical History:  Procedure Laterality Date  . COLONOSCOPY    . EYE SURGERY     cataracts removed right eye  . HYSTEROSCOPY WITH D & C N/A 05/06/2018   Procedure: DILATATION AND CURETTAGE /HYSTEROSCOPY;  Surgeon: [name], MD;  Location: [facility];  Service: Gynecology;  Laterality: N/A;  . LAPAROSCOPY N/A 08/26/2018   Procedure: LAPAROSCOPY DIAGNOSTIC;  Surgeon: [name], MD;  Location: [facility];  Service: General;  Laterality: N/A;  . MULTIPLE TOOTH EXTRACTIONS    . ROBOTIC ASSISTED TOTAL HYSTERECTOMY WITH BILATERAL SALPINGO OOPHERECTOMY Bilateral 05/24/2018   Procedure: XI ROBOTIC ASSISTED TOTAL HYSTERECTOMY WITH BILATERAL SALPINGO OOPHORECTOMY;  Surgeon: [name], MD;  Location: [name];  Service: Gynecology;  Laterality: Bilateral;  . SENTINEL NODE BIOPSY N/A 05/24/2018   Procedure: SENTINEL LYMP  NODE BIOPSY;  Surgeon: [name], MD;  Location: [name];  Service: Gynecology;  Laterality: N/A;   Social History   Socioeconomic History  . Marital status: Widowed    Spouse name: Not on file  . Number of children: 5  . Years of education: Not on file  . Highest education level: Not on file  Occupational History  . Occupation: retired  Tobacco Use  . Smoking status: Never Smoker  . Smokeless tobacco: Never Used  Vaping Use  . Vaping Use: Never used  Substance and Sexual Activity  .  Alcohol use: Not Currently  . Drug use: Never  . Sexual activity: Not Currently    Partners: Male    Birth control/protection: Post-menopausal  Other Topics Concern  . Not on  file  Social History Narrative  . Not on file   Social Determinants of Health   Financial Resource Strain: Not on file  Food Insecurity: Not on file  Transportation Needs: Not on file  Physical Activity: Not on file  Stress: Not on file  Social Connections: Not on file   Family History  Problem Relation Age of Onset  . Obesity Son   . Hypertension Father   . Stroke Father   . Hypertension Sister   . Heart failure Sister   . Hypertension Brother   . Hypertension Sister   . Hypertension Sister    Allergies  Allergen Reactions  . Other Anaphylaxis and Swelling    Walnuts = Throat swells  . Penicillins Swelling and Rash    Has patient had a PCN reaction causing immediate rash, facial/tongue/throat swelling, SOB or lightheadedness with hypotension: Yes Has patient had a PCN reaction causing severe rash involving mucus membranes or skin necrosis: Unk Has patient had a PCN reaction that required hospitalization: Unk Has patient had a PCN reaction occurring within the last 10 years: No If all of the above answers are "NO", then may proceed with Cephalosporin use.    Outpatient Encounter Medications as of 09/23/2020  Medication Sig  . acetaminophen (TYLENOL) 500 MG tablet Take 1,000 mg by mouth every 6 (six) hours as needed (for pain).   . Calcium Carb-Cholecalciferol (CALCIUM + D3 PO) Take 1 tablet by mouth daily.  . clindamycin (CLEOCIN) 150 MG capsule Take 150 mg by mouth 3 (three) times daily.  . cycloSPORINE (RESTASIS) 0.05 % ophthalmic emulsion Place 1 drop into both eyes 2 (two) times daily.  Marland Kitchen diltiazem (CARDIZEM CD) 240 MG 24 hr capsule TAKE 1 CAPSULE EVERY DAY  . docusate sodium (COLACE) 100 MG capsule Take 1 capsule (100 mg total) by mouth 2 (two) times daily as needed for mild constipation or moderate constipation.  . ferrous sulfate 325 (65 FE) MG EC tablet Take 1 tablet (325 mg total) by mouth 2 (two) times daily.  . isosorbide mononitrate (ISMO,MONOKET) 20 MG tablet  Take 20 mg by mouth daily.  . metoprolol succinate (TOPROL-XL) 50 MG 24 hr tablet Take 50 mg by mouth daily.  Marland Kitchen omeprazole (PRILOSEC) 20 MG capsule   . senna (SENOKOT) 8.6 MG TABS tablet Take 1 tablet by mouth daily.  . simvastatin (ZOCOR) 20 MG tablet Take 20 mg by mouth at bedtime.   Marland Kitchen spironolactone (ALDACTONE) 25 MG tablet Take 1 tablet (25 mg total) by mouth daily.  . traMADol (ULTRAM) 50 MG tablet Take 25 mg by mouth as needed.   . vitamin C (ASCORBIC ACID) 500 MG tablet Take 500 mg by mouth daily.  . [DISCONTINUED] furosemide (LASIX) 40 MG tablet Take 40 mg by mouth as needed.   No facility-administered encounter medications on file as of 09/23/2020.   Patient Care Team    Relationship Specialty Notifications Start End  [name], NP PCP - General   05/21/20     Current Diagnosis/Assessment: Goals Addressed   None    Hypertension   BP today is:  <130/80  Office blood pressures are  BP Readings from Last 3 Encounters:  08/27/20 134/77  08/07/20 139/73  08/06/20 (!) 165/94    Patient has failed these meds in the  past: Amlodipine, clonidine, enalapril,   Patient checks BP at home daily  Patient home BP readings are ranging: 102-163/57-95  We discussed diet and exercise extensively  Plan  Continue current medications and control with diet and exercise     ______________ Visit Information SDOH (Social Determinants of Health) assessments performed: Yes.  Brenda Alexander was given information about Principle Care Management/Remote Patient Monitoring services today including:  1. RPM/PCM service includes personalized support from designated clinical staff supervised by her physician, including individualized plan of care and coordination with other care providers 2. 24/7 contact phone numbers for assistance for urgent and routine care needs. 3. Standard insurance, coinsurance, copays and deductibles apply for principle care management only during months in which we  provide at least 30 minutes of these services. Most insurances cover these services at 100%, however patients may be responsible for any copay, coinsurance and/or deductible if applicable. This service may help you avoid the need for more expensive face-to-face services. 4. Only one practitioner may furnish and bill the service in a calendar month. 5. The patient may stop PCM/RPM services at any time (effective at the end of the month) by phone call to the office staff.  Patient agreed to services and verbal consent obtained.   Manuela Schwartz, Pharm.D. [facility] [phone]

## 2020-10-09 ENCOUNTER — Encounter: Payer: Self-pay | Admitting: Podiatry

## 2020-10-09 ENCOUNTER — Ambulatory Visit (INDEPENDENT_AMBULATORY_CARE_PROVIDER_SITE_OTHER): Payer: Medicare HMO | Admitting: Podiatry

## 2020-10-09 ENCOUNTER — Other Ambulatory Visit: Payer: Self-pay

## 2020-10-09 DIAGNOSIS — B351 Tinea unguium: Secondary | ICD-10-CM | POA: Diagnosis not present

## 2020-10-09 DIAGNOSIS — M2041 Other hammer toe(s) (acquired), right foot: Secondary | ICD-10-CM

## 2020-10-09 DIAGNOSIS — M2142 Flat foot [pes planus] (acquired), left foot: Secondary | ICD-10-CM

## 2020-10-09 DIAGNOSIS — E1142 Type 2 diabetes mellitus with diabetic polyneuropathy: Secondary | ICD-10-CM | POA: Diagnosis not present

## 2020-10-09 DIAGNOSIS — M79675 Pain in left toe(s): Secondary | ICD-10-CM | POA: Diagnosis not present

## 2020-10-09 DIAGNOSIS — L84 Corns and callosities: Secondary | ICD-10-CM | POA: Diagnosis not present

## 2020-10-09 DIAGNOSIS — M2042 Other hammer toe(s) (acquired), left foot: Secondary | ICD-10-CM

## 2020-10-09 DIAGNOSIS — M79674 Pain in right toe(s): Secondary | ICD-10-CM

## 2020-10-09 DIAGNOSIS — M2141 Flat foot [pes planus] (acquired), right foot: Secondary | ICD-10-CM

## 2020-10-09 NOTE — Progress Notes (Signed)
Subjective:  Patient ID: Brenda Alexander, female    DOB: [birth year],  MRN: [id number]  87 y.o. female presents with at risk foot care with history of diabetic neuropathy and painful thick toenails that are difficult to trim. Pain interferes with ambulation. Aggravating factors include wearing enclosed shoe gear. Pain is relieved with periodic professional debridement.   Her daughter is present during today's visit. Mom c/o pain at distal tips of her toes where she has corns.  Patient states she is not required to monitor blood glucose.  PCP: Brenda Mile, NP and last visit was: 09/11/2020.  Review of Systems: Negative except as noted in the HPI.   Allergies  Allergen Reactions  . Other Anaphylaxis and Swelling    Walnuts = Throat swells  . Penicillins Swelling and Rash    Has patient had a PCN reaction causing immediate rash, facial/tongue/throat swelling, SOB or lightheadedness with hypotension: Yes Has patient had a PCN reaction causing severe rash involving mucus membranes or skin necrosis: Unk Has patient had a PCN reaction that required hospitalization: Unk Has patient had a PCN reaction occurring within the last 10 years: No If all of the above answers are "NO", then may proceed with Cephalosporin use.     Objective:  There were no vitals filed for this visit. Constitutional Patient is a pleasant 87 y.o. African American female WD, WN in NAD. AAO x 3.  Vascular Capillary fill time to digits <3 seconds b/l lower extremities. Faintly palpable DP pulse(s) b/l lower extremities. Nonpalpable PT pulse(s) b/l lower extremities. Pedal hair absent. Lower extremity skin temperature gradient within normal limits. Nonpitting edema noted b/l lower extremities. No cyanosis or clubbing noted.  Neurologic Normal speech. Protective sensation intact 5/5 intact bilaterally with 10g monofilament b/l. Vibratory sensation diminished b/l.  Dermatologic Pedal skin with normal turgor, texture and tone  bilaterally. No open wounds bilaterally. No interdigital macerations bilaterally. Toenails 1-5 b/l elongated, discolored, dystrophic, thickened, crumbly with subungual debris and tenderness to dorsal palpation. Hyperkeratotic lesion(s) L 3rd toe, R 2nd toe and R 3rd toe.  No erythema, no edema, no drainage, no fluctuance.  Orthopedic: Normal muscle strength 5/5 to all lower extremity muscle groups bilaterally. No pain crepitus or joint limitation noted with ROM b/l. Hammertoes noted to the 2-5 bilaterally.     Assessment:   1. Pain due to onychomycosis of toenails of both feet   2. Corns   3. Acquired hammertoes of both feet   4. Pes planus of both feet   5. Diabetic peripheral neuropathy associated with type 2 diabetes mellitus ([place])    Plan:  Patient was evaluated and treated and all questions answered.  Onychomycosis with pain -Nails palliatively debridement as below. -Educated on self-care  Procedure: Nail Debridement Rationale: Pain Type of Debridement: manual, sharp debridement. Instrumentation: Nail nipper, rotary burr. Number of Nails: 10  -Examined patient. -Continue diabetic foot care principles. -Patient to continue soft, supportive shoe gear daily. Start procedure for diabetic shoes. Patient qualifies based on diagnoses. -Toenails 1-5 b/l were debrided in length and girth with sterile nail nippers and dremel without iatrogenic bleeding.  -Corn(s) L 3rd toe, R 2nd toe and R 3rd toe pared utilizing sterile scalpel blade without complication or incident. Total number debrided=3. -Patient to report any pedal injuries to medical professional immediately. -Dispensed digital toe caps for b/l 3rd and right 2nd toes. Apply every morning. Remove every evening. -Patient/POA to call should there be question/concern in the interim.  Return in about 3 months (around  01/09/2021).  Marzetta Board, DPM

## 2020-10-12 ENCOUNTER — Other Ambulatory Visit: Payer: Self-pay | Admitting: Cardiology

## 2020-10-12 DIAGNOSIS — I10 Essential (primary) hypertension: Secondary | ICD-10-CM

## 2020-10-17 ENCOUNTER — Ambulatory Visit: Payer: Medicare HMO | Admitting: Podiatry

## 2020-11-20 ENCOUNTER — Other Ambulatory Visit: Payer: Self-pay

## 2020-11-20 ENCOUNTER — Ambulatory Visit (INDEPENDENT_AMBULATORY_CARE_PROVIDER_SITE_OTHER): Payer: Medicare HMO | Admitting: Podiatry

## 2020-11-20 DIAGNOSIS — M2041 Other hammer toe(s) (acquired), right foot: Secondary | ICD-10-CM

## 2020-11-20 DIAGNOSIS — M2042 Other hammer toe(s) (acquired), left foot: Secondary | ICD-10-CM

## 2020-11-20 DIAGNOSIS — E1142 Type 2 diabetes mellitus with diabetic polyneuropathy: Secondary | ICD-10-CM

## 2020-11-20 NOTE — Progress Notes (Signed)
Patient presented for foam casting for 3 pair of custom diabetic shoe inserts. Patient is measured with a [place] device to be a size 11 x-wide  Diabetic shoes are chosen from the [employer] step catalog. The shoes chosen are 822 (patient wants the black ones)  The patient will be contacted when the shoes and inserts are ready for pick up

## 2020-11-21 ENCOUNTER — Other Ambulatory Visit: Payer: Medicare HMO

## 2020-12-26 ENCOUNTER — Telehealth: Payer: Self-pay | Admitting: Podiatry

## 2020-12-26 NOTE — Telephone Encounter (Signed)
Diabetic shoes/inserts in..lvm for pt to call to schedule an appt to pick them up. 

## 2021-01-02 ENCOUNTER — Ambulatory Visit: Payer: Medicare HMO | Admitting: Cardiology

## 2021-01-15 NOTE — Progress Notes (Signed)
Rescheduled

## 2021-01-16 ENCOUNTER — Ambulatory Visit: Payer: Medicare HMO | Admitting: Cardiology

## 2021-01-16 DIAGNOSIS — I10 Essential (primary) hypertension: Secondary | ICD-10-CM

## 2021-01-16 DIAGNOSIS — I5032 Chronic diastolic (congestive) heart failure: Secondary | ICD-10-CM

## 2021-01-22 ENCOUNTER — Other Ambulatory Visit: Payer: Medicare HMO

## 2021-01-22 ENCOUNTER — Other Ambulatory Visit: Payer: Self-pay

## 2021-01-22 ENCOUNTER — Ambulatory Visit (INDEPENDENT_AMBULATORY_CARE_PROVIDER_SITE_OTHER): Payer: Medicare HMO | Admitting: Podiatry

## 2021-01-22 DIAGNOSIS — B351 Tinea unguium: Secondary | ICD-10-CM

## 2021-01-22 DIAGNOSIS — M79675 Pain in left toe(s): Secondary | ICD-10-CM

## 2021-01-22 DIAGNOSIS — M79674 Pain in right toe(s): Secondary | ICD-10-CM

## 2021-01-22 DIAGNOSIS — L84 Corns and callosities: Secondary | ICD-10-CM

## 2021-01-22 DIAGNOSIS — M2142 Flat foot [pes planus] (acquired), left foot: Secondary | ICD-10-CM | POA: Diagnosis not present

## 2021-01-22 DIAGNOSIS — E1142 Type 2 diabetes mellitus with diabetic polyneuropathy: Secondary | ICD-10-CM | POA: Diagnosis not present

## 2021-01-22 DIAGNOSIS — M2141 Flat foot [pes planus] (acquired), right foot: Secondary | ICD-10-CM | POA: Diagnosis not present

## 2021-01-22 DIAGNOSIS — M2042 Other hammer toe(s) (acquired), left foot: Secondary | ICD-10-CM

## 2021-01-22 DIAGNOSIS — M2041 Other hammer toe(s) (acquired), right foot: Secondary | ICD-10-CM | POA: Diagnosis not present

## 2021-01-24 ENCOUNTER — Encounter: Payer: Self-pay | Admitting: Podiatry

## 2021-01-24 NOTE — Progress Notes (Signed)
  Subjective:  Patient ID: Brenda Alexander, female    DOB: February 06, 1933,  MRN: [id number]  87 y.o. female presents with at risk foot care with history of diabetic neuropathy and painful thick toenails that are difficult to trim. Pain interferes with ambulation. Aggravating factors include wearing enclosed shoe gear. Pain is relieved with periodic professional debridement.   Her daughter is present during today's visit.  Patient states she has sharp pain in her right 3rd digit. Denies any trauma, redness, drainage or swelling of digit.  Patient states her blood glucose was 119 or 120 mg/dl on yesteday.  PCP: [name], NP and last visit was: May, 2022.  Review of Systems: Negative except as noted in the HPI.   Allergies  Allergen Reactions   Other Anaphylaxis and Swelling    Walnuts = Throat swells   Penicillins Swelling and Rash    Has patient had a PCN reaction causing immediate rash, facial/tongue/throat swelling, SOB or lightheadedness with hypotension: Yes Has patient had a PCN reaction causing severe rash involving mucus membranes or skin necrosis: Unk Has patient had a PCN reaction that required hospitalization: Unk Has patient had a PCN reaction occurring within the last 10 years: No If all of the above answers are "NO", then may proceed with Cephalosporin use.     Objective:  There were no vitals filed for this visit. Constitutional Patient is a pleasant 87 y.o. African American female WD, WN in NAD. AAO x 3.  Vascular Capillary fill time to digits <3 seconds b/l lower extremities. Faintly palpable DP pulse(s) b/l lower extremities. Nonpalpable PT pulse(s) b/l lower extremities. Pedal hair absent. Lower extremity skin temperature gradient within normal limits. Nonpitting edema noted b/l lower extremities. No cyanosis or clubbing noted.  Neurologic Normal speech. Protective sensation intact 5/5 intact bilaterally with 10g monofilament b/l. Vibratory sensation diminished b/l.   Dermatologic Pedal skin with normal turgor, texture and tone b/l lower extremities No open wounds b/l lower extremities No interdigital macerations b/l lower extremities Toenails 1-5 b/l elongated, discolored, dystrophic, thickened, crumbly with subungual debris and tenderness to dorsal palpation. Hyperkeratotic lesion(s) L 3rd toe, L 5th toe, R 2nd toe, and R 3rd toe.  No erythema, no edema, no drainage, no fluctuance.  Orthopedic: Normal muscle strength 5/5 to all lower extremity muscle groups bilaterally. No pain crepitus or joint limitation noted with ROM b/l. Hammertoes noted to the 2-5 bilaterally.   Assessment:   1. Pain due to onychomycosis of toenails of both feet   2. Corns   3. Diabetic peripheral neuropathy associated with type 2 diabetes mellitus ([place])    Plan:  -Examined patient. -Continue diabetic foot care principles. -Patient to continue soft, supportive shoe gear daily. Start procedure for diabetic shoes. Patient qualifies based on diagnoses. -Toenails 1-5 b/l were debrided in length and girth with sterile nail nippers and dremel without iatrogenic bleeding.  -Corn(s) L 3rd toe, R 2nd toe, left 5th digit and R 3rd toe pared utilizing sterile scalpel blade. Light bleeding left 3rd digit addressed with Lumicain.TAO and light dressing applied. Patient instructed to remove dressing on tomorrow. No further treatment required by patient. Total number debrided=4. -Patient to report any pedal injuries to medical professional immediately. -Continue digital toe caps for b/l 3rd and right 2nd toes. Apply every morning. Remove every evening. -Patient/POA to call should there be question/concern in the interim.  Return in about 3 months (around 04/24/2021).  Marzetta Board, DPM

## 2021-02-20 ENCOUNTER — Ambulatory Visit: Payer: Medicare HMO | Admitting: Cardiology

## 2021-02-28 ENCOUNTER — Ambulatory Visit: Payer: Medicare HMO | Admitting: Cardiology

## 2021-04-18 ENCOUNTER — Other Ambulatory Visit: Payer: Self-pay

## 2021-04-18 ENCOUNTER — Ambulatory Visit: Payer: Medicare HMO | Admitting: Cardiology

## 2021-04-18 ENCOUNTER — Encounter: Payer: Self-pay | Admitting: Cardiology

## 2021-04-18 VITALS — BP 190/90 | HR 71 | Temp 98.0°F | Resp 16 | Ht 62.0 in | Wt 167.0 lb

## 2021-04-18 DIAGNOSIS — I10 Essential (primary) hypertension: Secondary | ICD-10-CM

## 2021-04-18 DIAGNOSIS — I5032 Chronic diastolic (congestive) heart failure: Secondary | ICD-10-CM

## 2021-04-18 MED ORDER — LABETALOL HCL 200 MG PO TABS: 100.0000 mg | ORAL_TABLET | Freq: Two times a day (BID) | ORAL | 2 refills | Status: DC

## 2021-04-18 NOTE — Progress Notes (Signed)
Patient referred by [name], NP for heart failure.   Subjective:   Brenda Alexander, female    DOB: [birth year], 87 y.o.   MRN: [id number]   HPI  87 year old African-American female with uncontrolled hypertension, type 2 diabetes mellitus, history of endometrioid adenocarcinoma of the uterus now status post hysterectomy in 2019, history of blood loss anemia, HFpEF.  Patient is here today with her daughter. Patient has had at least two mechanical falls. Daughter is worries about her living by herself. Blood pressure is elevated. Leg edema is worse. Overall functional capacity is limited.   Current Outpatient Medications on File Prior to Visit  Medication Sig Dispense Refill   acetaminophen (TYLENOL) 500 MG tablet Take 1,000 mg by mouth every 6 (six) hours as needed (for pain).      Calcium Carb-Cholecalciferol (CALCIUM + D3 PO) Take 1 tablet by mouth daily.     clindamycin (CLEOCIN) 150 MG capsule Take 150 mg by mouth 3 (three) times daily.     cycloSPORINE (RESTASIS) 0.05 % ophthalmic emulsion Place 1 drop into both eyes 2 (two) times daily.     diltiazem (CARDIZEM CD) 240 MG 24 hr capsule TAKE 1 CAPSULE EVERY DAY 90 capsule 2   docusate sodium (COLACE) 100 MG capsule Take 1 capsule (100 mg total) by mouth 2 (two) times daily as needed for mild constipation or moderate constipation. 30 capsule 2   ferrous sulfate 325 (65 FE) MG EC tablet Take 1 tablet (325 mg total) by mouth 2 (two) times daily. 60 tablet 3   isosorbide mononitrate (ISMO,MONOKET) 20 MG tablet Take 20 mg by mouth daily.  4   metoprolol succinate (TOPROL-XL) 50 MG 24 hr tablet Take 50 mg by mouth daily.  2   omeprazole (PRILOSEC) 20 MG capsule      senna (SENOKOT) 8.6 MG TABS tablet Take 1 tablet by mouth daily.     simvastatin (ZOCOR) 20 MG tablet Take 20 mg by mouth at bedtime.      spironolactone (ALDACTONE) 25 MG tablet Take 1 tablet (25 mg total) by mouth daily. 90 tablet 3   traMADol (ULTRAM) 50 MG tablet Take 25  mg by mouth as needed.      vitamin C (ASCORBIC ACID) 500 MG tablet Take 500 mg by mouth daily.     [DISCONTINUED] furosemide (LASIX) 40 MG tablet Take 40 mg by mouth as needed.     No current facility-administered medications on file prior to visit.    Cardiovascular studies:  Echocardiogram 05/29/2020:  Normal LV systolic function with visual EF 55-60%. Left ventricle cavity  is normal in size. Moderate left ventricular hypertrophy. Normal global  wall motion. Doppler evidence of grade II diastolic dysfunction, elevated  LAP.  Left atrial cavity is severely dilated.  Aortic valve sclerosis without stenosis. Mild (Grade I) aortic  regurgitation.  Mild (Grade I) mitral regurgitation.  Compared to prior study dated 08/26/2018 no significant changes expect DD  is now Grade 2 with elevated LAP compared to Grade 1 DD.  EKG 05/21/2020: Sinus rhythm 62 bpm Left ventricular hypertrophy Left axis deviation Decreasing R-wave progression   EKG 09/19/2019: Sinus tachycardia 90 bpm. Significant baseline artifact.  Echocardiogram 08/26/2018: Mild LVH. Hyperdynamic LV with apical/intracavitary peak gradient 73 mmHg. Grade 1 DD Severe LA dilatation Mild aortic stenosis RVSP 54 mmHg  Recent labs: 08/06/2020: Glucose 108, BUN/Cr 40/1.31. EGFR 40. Na/K 136/5.1.  H/H 11/34. MCV 87. Platelets 203   08/30/2018: H/H 10.6/35.2. MCV 82. Platelets 298  Review of Systems  Cardiovascular:  Positive for leg swelling (Improved). Negative for chest pain, dyspnea on exertion, palpitations and syncope.      Vitals:   04/18/21 1416  BP: (!) 190/90  Pulse: 71  Resp: 16  Temp: 98 F (36.7 C)  SpO2: 97%    Objective:   Physical Exam Vitals and nursing note reviewed.  Constitutional:      General: She is not in acute distress.    Appearance: She is well-developed.  Neck:     Vascular: No JVD.  Cardiovascular:     Rate and Rhythm: Normal rate and regular rhythm.     Heart sounds: Normal  heart sounds. No murmur heard. Pulmonary:     Effort: Pulmonary effort is normal.     Breath sounds: Normal breath sounds. No wheezing or rales.  Musculoskeletal:     Right lower leg: Edema (Trace) present.     Left lower leg: Edema (Trace) present.  Neurological:     Mental Status: She is alert and oriented to person, place, and time.         Assessment & Recommendations:   87 year old African-American female with uncontrolled hypertension, type 2 diabetes mellitus, history of endometrioid adenocarcinoma of the uterus now status post hysterectomy in 2019, history of blood loss anemia, HFpEF.   Essential hypertension: Uncontrolled. Changed metoprolol succinate 50 mg daily to labetalol 20 mg bid. Check BMP. If favorable K, will add back spironolactone  HFpEF: Chronic, 2+ leg edema.  See above [id number].  Given concern for home safety, I encouraged the patient to consider assisted living.  Virtual f/u in 4 weeks  Marice Guidone Esther Hardy, MD [facility] Pager: [phone] Office: [phone] If no answer Cell [phone]

## 2021-04-19 ENCOUNTER — Encounter: Payer: Self-pay | Admitting: Cardiology

## 2021-04-19 LAB — BASIC METABOLIC PANEL
BUN/Creatinine Ratio: 22 (ref 12–28)
BUN: 17 mg/dL (ref 8–27)
CO2: 23 mmol/L (ref 20–29)
Calcium: 9.4 mg/dL (ref 8.7–10.3)
Chloride: 102 mmol/L (ref 96–106)
Creatinine, Ser: 0.78 mg/dL (ref 0.57–1.00)
Glucose: 93 mg/dL (ref 65–99)
Potassium: 4.4 mmol/L (ref 3.5–5.2)
Sodium: 141 mmol/L (ref 134–144)
eGFR: 74 mL/min/{1.73_m2} (ref >59)

## 2021-04-29 ENCOUNTER — Ambulatory Visit: Payer: Medicare HMO | Admitting: Podiatry

## 2021-05-24 ENCOUNTER — Other Ambulatory Visit: Payer: Self-pay

## 2021-05-24 ENCOUNTER — Ambulatory Visit: Payer: Medicare HMO | Admitting: Cardiology

## 2021-05-24 ENCOUNTER — Encounter: Payer: Self-pay | Admitting: Cardiology

## 2021-05-24 VITALS — BP 124/68 | HR 73 | Resp 16 | Ht 62.0 in | Wt 156.0 lb

## 2021-05-24 DIAGNOSIS — I10 Essential (primary) hypertension: Secondary | ICD-10-CM

## 2021-05-24 DIAGNOSIS — I5032 Chronic diastolic (congestive) heart failure: Secondary | ICD-10-CM

## 2021-05-24 MED ORDER — LABETALOL HCL 100 MG PO TABS: 100.0000 mg | ORAL_TABLET | Freq: Two times a day (BID) | ORAL | 3 refills | Status: DC

## 2021-05-24 MED ORDER — SPIRONOLACTONE 25 MG PO TABS: 25.0000 mg | ORAL_TABLET | Freq: Every day | ORAL | 3 refills | Status: DC

## 2021-05-24 NOTE — Progress Notes (Signed)
Patient referred by [name], NP for heart failure.   Subjective:   Brenda Alexander, female    DOB: [birth year], 87 y.o.   MRN: [id number]   I connected with the patient on 05/24/2021 by a telephone call and verified that I am speaking with the correct person using two identifiers.     I offered the patient a video enabled application for a virtual visit. Unfortunately, this could not be accomplished due to technical difficulties/lack of video enabled phone/computer. I discussed the limitations of evaluation and management by telemedicine and the availability of in person appointments. The patient expressed understanding and agreed to proceed.   This visit type was conducted due to national recommendations for restrictions regarding the COVID-19 Pandemic (e.g. social distancing).  This format is felt to be most appropriate for this patient at this time.  All issues noted in this document were discussed and addressed.  No physical exam was performed (except for noted visual exam findings with Tele health visits).  The patient has consented to conduct a Tele health visit and understands insurance will be billed.   HPI  87 year old African-American female with uncontrolled hypertension, type 2 diabetes mellitus, history of endometrioid adenocarcinoma of the uterus now status post hysterectomy in 2019, history of blood loss anemia, HFpEF.  Spoke with the daughter over the phone. BP is better controlled. Leg edema has improved. Reviewed recent abs, details below.    Current Outpatient Medications on File Prior to Visit  Medication Sig Dispense Refill   acetaminophen (TYLENOL) 500 MG tablet Take 1,000 mg by mouth every 6 (six) hours as needed (for pain).      Calcium Carb-Cholecalciferol (CALCIUM + D3 PO) Take 1 tablet by mouth daily.     clindamycin (CLEOCIN) 150 MG capsule Take 150 mg by mouth 3 (three) times daily.     cycloSPORINE (RESTASIS) 0.05 % ophthalmic emulsion Place 1 drop into  both eyes 2 (two) times daily.     diltiazem (CARDIZEM CD) 240 MG 24 hr capsule TAKE 1 CAPSULE EVERY DAY 90 capsule 2   docusate sodium (COLACE) 100 MG capsule Take 1 capsule (100 mg total) by mouth 2 (two) times daily as needed for mild constipation or moderate constipation. 30 capsule 2   ferrous sulfate 325 (65 FE) MG EC tablet Take 1 tablet (325 mg total) by mouth 2 (two) times daily. 60 tablet 3   isosorbide mononitrate (ISMO,MONOKET) 20 MG tablet Take 20 mg by mouth daily.  4   labetalol (NORMODYNE) 200 MG tablet Take 0.5 tablets (100 mg total) by mouth 2 (two) times daily. 60 tablet 2   omeprazole (PRILOSEC) 20 MG capsule      senna (SENOKOT) 8.6 MG TABS tablet Take 1 tablet by mouth daily.     simvastatin (ZOCOR) 20 MG tablet Take 20 mg by mouth at bedtime.      spironolactone (ALDACTONE) 25 MG tablet Take 1 tablet (25 mg total) by mouth daily. 90 tablet 3   traMADol (ULTRAM) 50 MG tablet Take 25 mg by mouth as needed.      vitamin C (ASCORBIC ACID) 500 MG tablet Take 500 mg by mouth daily.     [DISCONTINUED] furosemide (LASIX) 40 MG tablet Take 40 mg by mouth as needed.     No current facility-administered medications on file prior to visit.    Cardiovascular studies:  Echocardiogram 05/29/2020:  Normal LV systolic function with visual EF 55-60%. Left ventricle cavity  is normal in size. Moderate  left ventricular hypertrophy. Normal global  wall motion. Doppler evidence of grade II diastolic dysfunction, elevated  LAP.  Left atrial cavity is severely dilated.  Aortic valve sclerosis without stenosis. Mild (Grade I) aortic  regurgitation.  Mild (Grade I) mitral regurgitation.  Compared to prior study dated 08/26/2018 no significant changes expect DD  is now Grade 2 with elevated LAP compared to Grade 1 DD.  EKG 05/21/2020: Sinus rhythm 62 bpm Left ventricular hypertrophy Left axis deviation Decreasing R-wave progression   EKG 09/19/2019: Sinus tachycardia 90  bpm. Significant baseline artifact.  Recent labs: 04/18/2021: Glucose 93, BUN/Cr 17/0.68. EGFR 74. Na/K 141/4.4.   08/06/2020: Glucose 108, BUN/Cr 40/1.31. EGFR 40. Na/K 136/5.1.  H/H 11/34. MCV 87. Platelets 203   08/30/2018: H/H 10.6/35.2. MCV 82. Platelets 298   Review of Systems  Cardiovascular:  Positive for leg swelling (Improved). Negative for chest pain, dyspnea on exertion, palpitations and syncope.      Vitals:   05/24/21 1105  BP: 124/68  Pulse: 73  Resp: 16     Objective:   Physical exam not performed, telephone visit      Assessment & Recommendations:   87 year old African-American female with uncontrolled hypertension, type 2 diabetes mellitus, history of endometrioid adenocarcinoma of the uterus now status post hysterectomy in 2019, history of blood loss anemia, HFpEF.   Essential hypertension: Controlled. Continue labetalol 100 mg bid, spironolactone 25 mg daily.  HFpEF:  Improving leg edema  F/u 6 months (virtual visit)  Nigel Mormon, MD [facility] Pager: [phone] Office: [phone] If no answer Cell [phone]

## 2021-05-31 ENCOUNTER — Other Ambulatory Visit: Payer: Self-pay

## 2021-05-31 DIAGNOSIS — I5032 Chronic diastolic (congestive) heart failure: Secondary | ICD-10-CM

## 2021-05-31 DIAGNOSIS — I10 Essential (primary) hypertension: Secondary | ICD-10-CM

## 2021-05-31 MED ORDER — SPIRONOLACTONE 25 MG PO TABS: 25.0000 mg | ORAL_TABLET | Freq: Every day | ORAL | 3 refills | Status: DC

## 2021-06-27 ENCOUNTER — Telehealth: Payer: Self-pay | Admitting: *Deleted

## 2021-07-08 NOTE — Telephone Encounter (Signed)
Error message

## 2021-09-11 ENCOUNTER — Encounter: Payer: Self-pay | Admitting: Podiatry

## 2021-09-11 ENCOUNTER — Other Ambulatory Visit: Payer: Self-pay

## 2021-09-11 ENCOUNTER — Ambulatory Visit (INDEPENDENT_AMBULATORY_CARE_PROVIDER_SITE_OTHER): Payer: Medicare HMO | Admitting: Podiatry

## 2021-09-11 DIAGNOSIS — M2011 Hallux valgus (acquired), right foot: Secondary | ICD-10-CM | POA: Diagnosis not present

## 2021-09-11 DIAGNOSIS — M2041 Other hammer toe(s) (acquired), right foot: Secondary | ICD-10-CM | POA: Diagnosis not present

## 2021-09-11 DIAGNOSIS — E119 Type 2 diabetes mellitus without complications: Secondary | ICD-10-CM

## 2021-09-11 DIAGNOSIS — L84 Corns and callosities: Secondary | ICD-10-CM

## 2021-09-11 DIAGNOSIS — B351 Tinea unguium: Secondary | ICD-10-CM | POA: Diagnosis not present

## 2021-09-11 DIAGNOSIS — E1142 Type 2 diabetes mellitus with diabetic polyneuropathy: Secondary | ICD-10-CM

## 2021-09-17 NOTE — Progress Notes (Signed)
ANNUAL DIABETIC FOOT EXAM  Subjective: Brenda Alexander presents today for for annual diabetic foot examination.  Patient relates dx of diabetes.  Patient denies any h/o foot wounds.  Patient denies any numbness, tingling, burning, or pins/needle sensation in feet.  Risk factors: diabetes, hyperlipidemia, HTN, CAD, CHF.  [name], NP is patient's PCP. Last visit was December, 2022.  Past Medical History:  Diagnosis Date   Anemia    Arthritis    knees, hands   Coronary artery disease    Diabetes mellitus without complication (Brenda Alexander)    no meds   Dyspnea 01/12/2018   w/anemia dx and hospital admit w/blood transfused    Endometrioid adenocarcinoma of uterus (Brenda Alexander) 05/13/2018   History of blood transfusion 01/12/2018   at [facility]   Hyperlipemia    Hypertension    SVD (spontaneous vaginal delivery)    x 6 - 5 living children   Uses walker    rollator   Wears partial dentures    upper and lower partials   Patient Active Problem List   Diagnosis Date Noted   AKI (acute kidney injury) ([name]) 08/07/2020   Chronic heart failure with preserved ejection fraction ([place]) 10/27/2018   Frequent falls 10/27/2018   SBO (small bowel obstruction) ([place]) 07/22/2018   Leukocytosis 07/22/2018   Hypokalemia 07/22/2018   Type 2 diabetes mellitus ([name]) 07/22/2018   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   Physical deconditioning 07/22/2018   Endometrioid adenocarcinoma of uterus (Brenda Alexander) 05/13/2018   PMB (postmenopausal bleeding) 04/09/2018   Bladder mass    Anemia 01/11/2018   Essential hypertension 01/11/2018   Coronary artery disease 01/11/2018   Past Surgical History:  Procedure Laterality Date   COLONOSCOPY     EYE SURGERY     cataracts removed right eye   HYSTEROSCOPY WITH D & C N/A 05/06/2018   Procedure: DILATATION AND CURETTAGE /HYSTEROSCOPY;  Surgeon: [name], MD;  Location: [name];  Service: Gynecology;  Laterality: N/A;   LAPAROSCOPY N/A 08/26/2018   Procedure:  LAPAROSCOPY DIAGNOSTIC;  Surgeon: [name], MD;  Location: [name];  Service: General;  Laterality: N/A;   MULTIPLE TOOTH EXTRACTIONS     ROBOTIC ASSISTED TOTAL HYSTERECTOMY WITH BILATERAL SALPINGO OOPHERECTOMY Bilateral 05/24/2018   Procedure: XI ROBOTIC ASSISTED TOTAL HYSTERECTOMY WITH BILATERAL SALPINGO OOPHORECTOMY;  Surgeon: [name], MD;  Location: [name];  Service: Gynecology;  Laterality: Bilateral;   SENTINEL NODE BIOPSY N/A 05/24/2018   Procedure: SENTINEL LYMP  NODE BIOPSY;  Surgeon: [name], MD;  Location: [name];  Service: Gynecology;  Laterality: N/A;   Current Outpatient Medications on File Prior to Visit  Medication Sig Dispense Refill   acetaminophen (TYLENOL) 500 MG tablet Take 1,000 mg by mouth every 6 (six) hours as needed (for pain).      amLODipine (NORVASC) 5 MG tablet Take by mouth.     Calcium Carb-Cholecalciferol (CALCIUM + D3 PO) Take 1 tablet by mouth daily.     cephALEXin (KEFLEX) 500 MG capsule      clindamycin (CLEOCIN) 150 MG capsule Take 150 mg by mouth 3 (three) times daily.     cloNIDine (CATAPRES) 0.2 MG tablet Take by mouth.     cycloSPORINE (RESTASIS) 0.05 % ophthalmic emulsion Place 1 drop into both eyes 2 (two) times daily.     diltiazem (CARDIZEM CD) 240 MG 24 hr capsule TAKE 1 CAPSULE EVERY DAY 90 capsule 2   docusate sodium (COLACE) 100 MG capsule Take 1 capsule (100 mg total) by mouth 2 (two)  times daily as needed for mild constipation or moderate constipation. 30 capsule 2   enalapril (VASOTEC) 20 MG tablet Take by mouth.     ferrous sulfate 325 (65 FE) MG EC tablet Take 1 tablet (325 mg total) by mouth 2 (two) times daily. 60 tablet 3   isosorbide mononitrate (ISMO,MONOKET) 20 MG tablet Take 20 mg by mouth daily.  4   labetalol (NORMODYNE) 100 MG tablet Take 1 tablet (100 mg total) by mouth 2 (two) times daily. 180 tablet 3   labetalol (NORMODYNE) 200 MG tablet      omeprazole (PRILOSEC) 20 MG capsule      ondansetron (ZOFRAN) 4 MG  tablet      prednisoLONE acetate (PRED FORTE) 1 % ophthalmic suspension Place 1 drop into the left eye 4 (four) times daily.     senna (SENOKOT) 8.6 MG TABS tablet Take 1 tablet by mouth daily.     simvastatin (ZOCOR) 20 MG tablet Take 20 mg by mouth at bedtime.      spironolactone (ALDACTONE) 25 MG tablet Take 1 tablet (25 mg total) by mouth daily. 90 tablet 3   sulfamethoxazole-trimethoprim (BACTRIM) 400-80 MG tablet      traMADol (ULTRAM) 50 MG tablet Take 25 mg by mouth as needed.      vitamin C (ASCORBIC ACID) 500 MG tablet Take 500 mg by mouth daily.     [DISCONTINUED] furosemide (LASIX) 40 MG tablet Take 40 mg by mouth as needed.     No current facility-administered medications on file prior to visit.    Allergies  Allergen Reactions   Other Anaphylaxis and Swelling    Walnuts = Throat swells   Penicillins Swelling and Rash    Has patient had a PCN reaction causing immediate rash, facial/tongue/throat swelling, SOB or lightheadedness with hypotension: Yes Has patient had a PCN reaction causing severe rash involving mucus membranes or skin necrosis: Unk Has patient had a PCN reaction that required hospitalization: Unk Has patient had a PCN reaction occurring within the last 10 years: No If all of the above answers are "NO", then may proceed with Cephalosporin use.    Social History   Occupational History   Occupation: retired  Tobacco Use   Smoking status: Never   Smokeless tobacco: Never  Scientific laboratory technician Use: Never used  Substance and Sexual Activity   Alcohol use: Not Currently   Drug use: Never   Sexual activity: Not Currently    Partners: Male    Birth control/protection: Post-menopausal   Family History  Problem Relation Age of Onset   Obesity Son    Hypertension Father    Stroke Father    Hypertension Sister    Heart failure Sister    Hypertension Brother    Hypertension Sister    Hypertension Sister    Immunization History  Administered Date(s)  Administered   Influenza,inj,Quad PF,6+ Mos 04/07/2018   Tdap 04/07/2018     Review of Systems: Negative except as noted in the HPI.   Objective: There were no vitals filed for this visit.  Jaella Weinert is a pleasant 88 y.o. female in NAD. AAO X 3.  Vascular Examination: CFT <3 seconds b/l LE. Faintly palpable DP pulses b/l LE. Diminished PT pulse(s) b/l LE. Pedal hair absent. No pain with calf compression b/l. Lower extremity skin temperature gradient within normal limits. Nonpitting edema noted BLE. No cyanosis or clubbing noted b/l LE.  Dermatological Examination: Pedal integument with normal turgor, texture and tone  b/l LE. No open wounds b/l. No interdigital macerations b/l. Toenails 1-5 b/l elongated, thickened, discolored with subungual debris. +Tenderness with dorsal palpation of nailplates. Hyperkeratotic lesion(s) noted bilateral 3rd toes and R 2nd toe.  Musculoskeletal Examination: Muscle strength 5/5 to all lower extremity muscle groups bilaterally. HAV with bunion deformity noted b/l LE. Hammertoe deformity noted 2-5 b/l. Pes planus deformity noted bilateral LE.  Footwear Assessment: Does the patient wear appropriate shoes? Yes. Does the patient need inserts/orthotics? Yes.  Neurological Examination: Protective sensation decreased with 10 gram monofilament b/l. Vibratory sensation diminished b/l.  Assessment: 1. Pain due to onychomycosis of toenails of both feet   2. Corns   3. Hallux valgus, acquired, bilateral   4. Acquired hammertoes of both feet   5. Diabetic peripheral neuropathy associated with type 2 diabetes mellitus ([place])   6. Encounter for diabetic foot exam ([place])     ADA Risk Categorization: High Risk  Patient has one or more of the following: Loss of protective sensation Absent pedal pulses Severe Foot deformity History of foot ulcer  Plan: -Diabetic foot examination performed today. -Continue foot and shoe inspections daily. Monitor blood glucose  per PCP/Endocrinologist's recommendations. -Mycotic toenails 1-5 bilaterally were debrided in length and girth with sterile nail nippers and dremel without incident. -Corn(s) bilateral 3rd toes and R 2nd toe pared utilizing sterile scalpel blade without complication or incident. Total number debrided=3. -Patient/POA to call should there be question/concern in the interim.  Return in about 3 months (around 12/09/2021).  Marzetta Board, DPM

## 2021-10-05 ENCOUNTER — Emergency Department (HOSPITAL_COMMUNITY): Payer: Medicare HMO

## 2021-10-05 ENCOUNTER — Emergency Department (HOSPITAL_COMMUNITY)
Admission: EM | Admit: 2021-10-05 | Discharge: 2021-10-05 | Disposition: A | Discharge status: Home / Self Care | Payer: Medicare HMO | Attending: Emergency Medicine | Admitting: Emergency Medicine

## 2021-10-05 ENCOUNTER — Encounter (HOSPITAL_COMMUNITY): Payer: Self-pay

## 2021-10-05 DIAGNOSIS — D649 Anemia, unspecified: Secondary | ICD-10-CM | POA: Insufficient documentation

## 2021-10-05 DIAGNOSIS — Z7984 Long term (current) use of oral hypoglycemic drugs: Secondary | ICD-10-CM | POA: Insufficient documentation

## 2021-10-05 DIAGNOSIS — Z79899 Other long term (current) drug therapy: Secondary | ICD-10-CM | POA: Insufficient documentation

## 2021-10-05 DIAGNOSIS — G8929 Other chronic pain: Secondary | ICD-10-CM | POA: Insufficient documentation

## 2021-10-05 DIAGNOSIS — W06XXXA Fall from bed, initial encounter: Secondary | ICD-10-CM | POA: Diagnosis not present

## 2021-10-05 DIAGNOSIS — M25562 Pain in left knee: Secondary | ICD-10-CM | POA: Insufficient documentation

## 2021-10-05 DIAGNOSIS — M25561 Pain in right knee: Secondary | ICD-10-CM | POA: Diagnosis not present

## 2021-10-05 DIAGNOSIS — I10 Essential (primary) hypertension: Secondary | ICD-10-CM | POA: Insufficient documentation

## 2021-10-05 DIAGNOSIS — W19XXXA Unspecified fall, initial encounter: Secondary | ICD-10-CM

## 2021-10-05 DIAGNOSIS — I2510 Atherosclerotic heart disease of native coronary artery without angina pectoris: Secondary | ICD-10-CM | POA: Diagnosis not present

## 2021-10-05 DIAGNOSIS — R296 Repeated falls: Secondary | ICD-10-CM | POA: Insufficient documentation

## 2021-10-05 DIAGNOSIS — E119 Type 2 diabetes mellitus without complications: Secondary | ICD-10-CM | POA: Insufficient documentation

## 2021-10-05 LAB — CBC WITH DIFFERENTIAL/PLATELET
Abs Immature Granulocytes: 0.01 10*3/uL (ref 0.00–0.07)
Basophils Absolute: 0 10*3/uL (ref 0.0–0.1)
Basophils Relative: 1 %
Eosinophils Absolute: 0.1 10*3/uL (ref 0.0–0.5)
Eosinophils Relative: 1 %
HCT: 31.8 % — ABNORMAL LOW (ref 36.0–46.0)
Hemoglobin: 10.1 g/dL — ABNORMAL LOW (ref 12.0–15.0)
Immature Granulocytes: 0 %
Lymphocytes Relative: 15 %
Lymphs Abs: 0.6 10*3/uL — ABNORMAL LOW (ref 0.7–4.0)
MCH: 28.1 pg (ref 26.0–34.0)
MCHC: 31.8 g/dL (ref 30.0–36.0)
MCV: 88.6 fL (ref 80.0–100.0)
Monocytes Absolute: 0.4 10*3/uL (ref 0.1–1.0)
Monocytes Relative: 11 %
Neutro Abs: 2.8 10*3/uL (ref 1.7–7.7)
Neutrophils Relative %: 72 %
Platelets: 200 10*3/uL (ref 150–400)
RBC: 3.59 MIL/uL — ABNORMAL LOW (ref 3.87–5.11)
RDW: 14.3 % (ref 11.5–15.5)
WBC: 3.9 10*3/uL — ABNORMAL LOW (ref 4.0–10.5)
nRBC: 0 % (ref 0.0–0.2)

## 2021-10-05 LAB — BASIC METABOLIC PANEL
Anion gap: 9 (ref 5–15)
BUN: 28 mg/dL — ABNORMAL HIGH (ref 8–23)
CO2: 25 mmol/L (ref 22–32)
Calcium: 9.2 mg/dL (ref 8.9–10.3)
Chloride: 103 mmol/L (ref 98–111)
Creatinine, Ser: 1.06 mg/dL — ABNORMAL HIGH (ref 0.44–1.00)
GFR, Estimated: 51 mL/min — ABNORMAL LOW (ref >60)
Glucose, Bld: 103 mg/dL — ABNORMAL HIGH (ref 70–99)
Potassium: 3.8 mmol/L (ref 3.5–5.1)
Sodium: 137 mmol/L (ref 135–145)

## 2021-10-05 LAB — URINALYSIS, ROUTINE W REFLEX MICROSCOPIC
Bilirubin Urine: NEGATIVE
Glucose, UA: NEGATIVE mg/dL
Hgb urine dipstick: NEGATIVE
Ketones, ur: 5 mg/dL — AB
Leukocytes,Ua: NEGATIVE
Nitrite: NEGATIVE
Protein, ur: NEGATIVE mg/dL
Specific Gravity, Urine: 1.025 (ref 1.005–1.030)
pH: 5 (ref 5.0–8.0)

## 2021-10-05 NOTE — ED Provider Notes (Addendum)
[place] DEPT Provider Note   CSN: [id number] Arrival date & time: 10/05/21  1030     History  Chief Complaint  Patient presents with   Brenda Alexander    Brenda Alexander is a 88 y.o. female.  HPI  88 year old female with past medical history of HTN, HLD, CAD, chronic anemia, diabetes presents emergency department after reported fall.  Patient at this time lives home alone.  She does have supportive family.  Uses a walker and bedside commode.  Reportedly has been having increasing falls.  Most recent fall was yesterday.  She states she tried to get out of bed and missed her walker and went down onto her knees/buttocks.  Did not hit her head, no loss of consciousness, no syncope.  She was weak and ended up being on the floor for couple hours.  Complaining of acute on chronic bilateral knee pain but otherwise has been baseline.  Family is at bedside.  No complaints of acute illness.  She is otherwise been compliant with her medications.  They have concern for her safety at home and are interested in exploring beginning the process to possible assisted living.  Home Medications Prior to Admission medications   Medication Sig Start Date End Date Taking? Authorizing Provider  acetaminophen (TYLENOL) 500 MG tablet Take 1,000 mg by mouth every 6 (six) hours as needed (for pain).     [provider]  amLODipine (NORVASC) 5 MG tablet Take by mouth. 07/09/21   [provider]  Calcium Carb-Cholecalciferol (CALCIUM + D3 PO) Take 1 tablet by mouth daily.    [provider]  cephALEXin (KEFLEX) 500 MG capsule  07/09/21   [provider]  clindamycin (CLEOCIN) 150 MG capsule Take 150 mg by mouth 3 (three) times daily.    [provider]  cloNIDine (CATAPRES) 0.2 MG tablet Take by mouth. 08/06/21   [provider]  cycloSPORINE (RESTASIS) 0.05 % ophthalmic emulsion Place 1 drop into both eyes 2 (two) times daily.    [provider]  diltiazem (CARDIZEM CD) 240 MG 24 hr capsule TAKE 1 CAPSULE EVERY DAY 10/15/20   Patwardhan, Manish J, MD  docusate sodium (COLACE) 100 MG capsule Take 1 capsule (100 mg total) by mouth 2 (two) times daily as needed for mild constipation or moderate constipation. 05/06/18   [name], MD  enalapril (VASOTEC) 20 MG tablet Take by mouth. 07/09/21   [provider]  ferrous sulfate 325 (65 FE) MG EC tablet Take 1 tablet (325 mg total) by mouth 2 (two) times daily. 01/13/18 05/24/21  Elgergawy, Silver Huguenin, MD  isosorbide mononitrate (ISMO,MONOKET) 20 MG tablet Take 20 mg by mouth daily. 12/23/17   [provider]  labetalol (NORMODYNE) 100 MG tablet Take 1 tablet (100 mg total) by mouth 2 (two) times daily. 05/24/21   Patwardhan, Reynold Bowen, MD  labetalol (NORMODYNE) 200 MG tablet  07/09/21   [provider]  omeprazole (PRILOSEC) 20 MG capsule  03/01/20   [provider]  ondansetron (ZOFRAN) 4 MG tablet  07/09/21   [provider]  prednisoLONE acetate (PRED FORTE) 1 % ophthalmic suspension Place 1 drop into the left eye 4 (four) times daily. 07/09/21   [provider]  senna (SENOKOT) 8.6 MG TABS tablet Take 1 tablet by mouth daily.    [provider]  simvastatin (ZOCOR) 20 MG tablet Take 20 mg by mouth at bedtime.     [provider]  spironolactone (ALDACTONE)  25 MG tablet Take 1 tablet (25 mg total) by mouth daily. 05/31/21   Patwardhan, Reynold Bowen, MD  sulfamethoxazole-trimethoprim (BACTRIM) 400-80 MG tablet  07/09/21   [provider]  traMADol (ULTRAM) 50 MG tablet Take 25 mg by mouth as needed.  03/01/20   [provider]  vitamin C (ASCORBIC ACID) 500 MG tablet Take 500 mg by mouth daily.    [provider]  furosemide (LASIX) 40 MG tablet Take 40 mg by mouth as needed.  08/06/20  [provider]      Allergies    Other and Penicillins    Review of Systems   Review of  Systems  Constitutional:  Positive for fatigue. Negative for fever.  Respiratory:  Negative for shortness of breath.   Cardiovascular:  Negative for chest pain and leg swelling.  Gastrointestinal:  Negative for abdominal pain, diarrhea and vomiting.  Skin:  Negative for rash.  Neurological:  Negative for dizziness, syncope, numbness and headaches.   Physical Exam Updated Vital Signs BP (!) 131/91 (BP Location: Right Arm)    Pulse 63    Temp 98.7 F (37.1 C) (Oral)    Resp (!) 21    LMP  (LMP Unknown)    SpO2 98%  Physical Exam Vitals and nursing note reviewed.  Constitutional:      General: She is not in acute distress.    Appearance: Normal appearance.     Comments: Very pleasant  HENT:     Head: Normocephalic.     Mouth/Throat:     Mouth: Mucous membranes are moist.  Cardiovascular:     Rate and Rhythm: Normal rate.  Pulmonary:     Effort: Pulmonary effort is normal. No respiratory distress.  Abdominal:     Palpations: Abdomen is soft.     Tenderness: There is no abdominal tenderness.  Musculoskeletal:     Comments: Tenderness to palpation of both knees but no signs of acute injury/deformity  Skin:    General: Skin is warm.  Neurological:     Mental Status: She is alert and oriented to person, place, and time. Mental status is at baseline.  Psychiatric:        Mood and Affect: Mood normal.    ED Results / Procedures / Treatments   Labs (all labs ordered are listed, but only abnormal results are displayed) Labs Reviewed  CBC WITH DIFFERENTIAL/PLATELET - Abnormal; Notable for the following components:      Result Value   WBC 3.9 (*)    RBC 3.59 (*)    Hemoglobin 10.1 (*)    HCT 31.8 (*)    Lymphs Abs 0.6 (*)    All other components within normal limits  BASIC METABOLIC PANEL - Abnormal; Notable for the following components:   Glucose, Bld 103 (*)    BUN 28 (*)    Creatinine, Ser 1.06 (*)    GFR, Estimated 51 (*)    All other components within normal limits   URINALYSIS, ROUTINE W REFLEX MICROSCOPIC - Abnormal; Notable for the following components:   APPearance HAZY (*)    Ketones, ur 5 (*)    All other components within normal limits    EKG None  Radiology DG Knee Complete 4 Views Right  Result Date: 10/05/2021 CLINICAL DATA:  Fall yesterday.  Right knee pain. EXAM: RIGHT KNEE - COMPLETE 4+ VIEW COMPARISON:  None. FINDINGS: No evidence of fracture, dislocation, or joint effusion. Tricompartmental osteoarthritis is seen with severe involvement of the lateral  compartment, and secondary tibia valgus. Patella alta noted, consistent with prior infrapatellar tendon injury. Generalized osteopenia is noted. Peripheral vascular calcification is seen. IMPRESSION: No acute findings. Patella alta, consistent with prior infrapatellar tendon injury. Tricompartmental osteoarthritis, with severe lateral compartment involvement and tibia valgus. Osteopenia. Electronically Signed   By: Marlaine Hind M.D.   On: 10/05/2021 11:55   DG Femur 1V Right  Result Date: 10/05/2021 CLINICAL DATA:  Fall.  Knee pain. EXAM: RIGHT FEMUR 1 VIEW COMPARISON:  None. FINDINGS: The frontal projection of femur demonstrates no femur fracture. Severe knee osteoarthritis with markedly severe medial compartmental articular space narrowing and marginal spurring. Irregular calcification proximal to the patella is probably in the vastus lateralis musculature, but could conceivably be in the suprapatellar bursa. Atherosclerosis. IMPRESSION: 1. Severe osteoarthritis of the knee. 2. Irregular popcorn type calcification projecting above the patella could be in the vastus lateralis muscle region or suprapatellar bursa. 3. Atherosclerosis. Electronically Signed   By: Van Clines M.D.   On: 10/05/2021 11:55    Procedures Procedures    Medications Ordered in ED Medications - No data to display  ED Course/ Medical Decision Making/ A&P                           Medical Decision  Making  88 year old female presents emergency department after a fall yesterday.  Complaining of acute on chronic knee pain.  She lives at home and while she has been independent has had a decrease in strength and ability to care for herself with more frequent falls.  Family is at bedside.  Patient and family are very pleasant.  X-rays of the knees show significant tricompartmental arthritis and chronic changes, no acute injury.  Blood work shows baseline anemia, urinalysis shows no UTI.  Family is interested in speaking with social work in regards to next steps for potential assisted living placement.  Otherwise patient appears medically cleared from my standpoint for discharge home.  Plan for face-to-face evaluation at home to start the possible assisted living placement process.  Family has been updated, they are comfortable with discharge.  Patient at this time appears safe and stable for discharge and close outpatient follow up. Discharge plan and strict return to ED precautions discussed, patient verbalizes understanding and agreement.      Final Clinical Impression(s) / ED Diagnoses Final diagnoses:  None    Rx / DC Orders ED Discharge Orders     None         Lorelle Gibbs, DO 10/05/21 1515    [name], Alvin Critchley, DO 10/05/21 1535

## 2021-10-05 NOTE — Care Management (Signed)
Spoke to daughter on phone, left message for other daughter Durenda Hurt. Discussed looking for ALF, and resources to assist you ( PCP and A place for mom) to start process. Patient does have home health with [facility] currently.  ?

## 2021-10-05 NOTE — Evaluation (Signed)
Physical Therapy Evaluation ?Patient Details ?Name: Brenda Alexander ?MRN: [id number] ?DOB: 12-Jun-1933 ?Today's Date: 10/05/2021 ? ?History of Present Illness ? 88 yo female admitted after falling at home. Was down on floor for a few hours before family found her. Hx of CAD, DM, recurrent falls, OA, uterine ca  ?Clinical Impression ? On eval in ED, pt required Min guard-Min A for mobility. She walked ~50 feet with her rollator. She denied pain during session, except for being uncomfortable on the stretcher. Family was present during session as well. Family is working on pt transitioning to an ALF to reside permanently. Discussed d/c plan-pt will return home this afternoon with family assisting as needed. Pt also has an aide that comes out during the week. Discussed HHPT f/u-family was undecided at time of eval. Encouraged them to speak with pt's PCP if they decide they would like to have [place].   ?   ? ?Recommendations for follow up therapy are one component of a multi-disciplinary discharge planning process, led by the attending physician.  Recommendations may be updated based on patient status, additional functional criteria and insurance authorization. ? ?Follow Up Recommendations Home health PT (if pt/family would like f/u while waiting to set up transition to ALF) ? ?  ?Assistance Recommended at Discharge Frequent or constant Supervision/Assistance  ?Patient can return home with the following ? A little help with bathing/dressing/bathroom;Assistance with cooking/housework;Assist for transportation;Help with stairs or ramp for entrance ? ?  ?Equipment Recommendations None recommended by PT  ?Recommendations for Other Services ?    ?  ?Functional Status Assessment Patient has had a recent decline in their functional status and demonstrates the ability to make significant improvements in function in a reasonable and predictable amount of time.  ? ?  ?Precautions / Restrictions Precautions ?Precautions:  Fall ?Restrictions ?Weight Bearing Restrictions: No  ? ?  ? ?Mobility ? Bed Mobility ?Overal bed mobility: Needs Assistance ?Bed Mobility: Supine to Sit ?  ?  ?Supine to sit: Min guard, HOB elevated ?  ?  ?General bed mobility comments: Increased time, especially from stretcher. ?  ? ?Transfers ?Overall transfer level: Needs assistance ?Equipment used: Rollator (4 wheels) ?Transfers: Sit to/from Stand ?Sit to Stand: Min assist, From elevated surface ?  ?  ?  ?  ?  ?General transfer comment: Assist to rise, steady, control descent. Cues for safety. Pt tends to pull up on rollator. ?  ? ?Ambulation/Gait ?Ambulation/Gait assistance: Min guard ?Gait Distance (Feet): 50 Feet ?Assistive device: Rollator (4 wheels) ?Gait Pattern/deviations: Trunk flexed, Decreased stride length ?  ?  ?  ?General Gait Details: Min guard for safety. Slow gait speed. No LOB with RW. Tolerated distance fairly well. ? ?Stairs ?  ?  ?  ?  ?  ? ?Wheelchair Mobility ?  ? ?Modified Rankin (Stroke Patients Only) ?  ? ?  ? ?Balance Overall balance assessment: Needs assistance, History of Falls ?  ?  ?  ?  ?Standing balance support: Bilateral upper extremity supported, During functional activity, Reliant on assistive device for balance ?Standing balance-Leahy Scale: Fair ?  ?  ?  ?  ?  ?  ?  ?  ?  ?  ?  ?  ?   ? ? ? ?Pertinent Vitals/Pain Pain Assessment ?Pain Assessment: No/denies pain  ? ? ?Home Living Family/patient expects to be discharged to:: Private residence ?Living Arrangements: Alone ?Available Help at Discharge: Personal care attendant (aide 5x/week 2 hours/day) ?Type of Home: Apartment ?Home Access: Level entry ?  ?  ?  ?  Home Layout: One level ?Home Equipment: Rollator (4 wheels);Cane - single point;Shower seat ?   ?  ?Prior Function Prior Level of Function : Independent/Modified Independent ?  ?  ?  ?  ?  ?  ?Mobility Comments: uses rollator for ambulation. ?ADLs Comments: family helps with transportation. aide helps daily. meals on  wheels (family also helps with that) ?  ? ? ?Hand Dominance  ?   ? ?  ?Extremity/Trunk Assessment  ? Upper Extremity Assessment ?Upper Extremity Assessment: Generalized weakness ?  ? ?Lower Extremity Assessment ?Lower Extremity Assessment: Generalized weakness ?  ? ?Cervical / Trunk Assessment ?Cervical / Trunk Assessment: Normal  ?Communication  ? Communication: No difficulties  ?Cognition Arousal/Alertness: Awake/alert ?Behavior During Therapy: [facility] for tasks assessed/performed ?Overall Cognitive Status: Within Functional Limits for tasks assessed ?  ?  ?  ?  ?  ?  ?  ?  ?  ?  ?  ?  ?  ?  ?  ?  ?  ?  ?  ? ?  ?General Comments   ? ?  ?Exercises    ? ?Assessment/Plan  ?  ?PT Assessment All further PT needs can be met in the next venue of care ([place])  ?PT Problem List Decreased strength;Decreased mobility;Decreased activity tolerance;Decreased balance;Decreased knowledge of use of DME ? ?   ?  ?PT Treatment Interventions     ? ?PT Goals (Current goals can be found in the Care Plan section)  ?Acute Rehab PT Goals ?Patient Stated Goal: pt/family would like transition to ALF ?PT Goal Formulation: All assessment and education complete, DC therapy ? ?  ?Frequency   ?  ? ? ?Co-evaluation   ?  ?  ?  ?  ? ? ?  ?AM-PAC PT "6 Clicks" Mobility  ?Outcome Measure Help needed turning from your back to your side while in a flat bed without using bedrails?: A Little ?Help needed moving from lying on your back to sitting on the side of a flat bed without using bedrails?: A Little ?Help needed moving to and from a bed to a chair (including a wheelchair)?: A Little ?Help needed standing up from a chair using your arms (e.g., wheelchair or bedside chair)?: A Little ?Help needed to walk in hospital room?: A Little ?Help needed climbing 3-5 steps with a railing? : A Lot ?6 Click Score: 17 ? ?  ?End of Session Equipment Utilized During Treatment: Gait belt ?Activity Tolerance: Patient tolerated treatment well ?Patient left: in chair;with  call bell/phone within reach;with family/visitor present (sitting on her rollator (pt/family request-pt was uncomfortable on stretcher)) ?  ?PT Visit Diagnosis: History of falling (Z91.81);Muscle weakness (generalized) (M62.81);Difficulty in walking, not elsewhere classified (R26.2) ?  ? ?Time: 9357-0177 ?PT Time Calculation (min) (ACUTE ONLY): 18 min ? ? ?Charges:   PT Evaluation ?$PT Eval Low Complexity: 1 Low ?  ?  ?   ? ? ?Mckenize Mezera P, PT ?Acute Rehabilitation  ?Office: [phone] ?Pager: [phone] ? ? ?

## 2021-10-05 NOTE — ED Triage Notes (Signed)
Pt arrived via [facility] with daughter. Mechanical fall yesterday morning, down until 1pm until daughter went to house to check on mother. Found BP was low at the time. Slowly came up throughout the day. Has follow up Monday with primary but was told to come to ed for eval today. C/o worsening pain to both knees/legs.  ?Family states they would like to speak to SW while here regarding placement for pt. States she lives alone at this time.  ?

## 2021-10-05 NOTE — Discharge Instructions (Signed)
You have been seen and discharged from the emergency department.  Your x-ray imaging and blood work were normal for you.  Your evaluated by physical therapy.  I consulted our social work team.  A home face-to-face encounter has been ordered for evaluation of further assistance and possible placement.  Follow-up with your primary provider for further evaluation and further care. Take home medications as prescribed. If you have any worsening symptoms or further concerns for your health please return to an emergency department for further evaluation. ?

## 2021-10-11 ENCOUNTER — Other Ambulatory Visit: Payer: Self-pay

## 2021-10-11 ENCOUNTER — Other Ambulatory Visit: Payer: Self-pay | Admitting: Cardiology

## 2021-10-11 ENCOUNTER — Encounter: Payer: Self-pay | Admitting: Cardiology

## 2021-10-11 ENCOUNTER — Ambulatory Visit: Payer: Medicare HMO | Admitting: Cardiology

## 2021-10-11 DIAGNOSIS — I10 Essential (primary) hypertension: Secondary | ICD-10-CM

## 2021-10-11 DIAGNOSIS — I5032 Chronic diastolic (congestive) heart failure: Secondary | ICD-10-CM

## 2021-10-11 MED ORDER — SPIRONOLACTONE 25 MG PO TABS: 25.0000 mg | ORAL_TABLET | Freq: Every day | ORAL | 0 refills | Status: DC

## 2021-10-11 NOTE — Progress Notes (Signed)
Error

## 2021-10-16 ENCOUNTER — Ambulatory Visit: Payer: Medicare HMO | Admitting: Cardiology

## 2021-10-16 ENCOUNTER — Encounter: Payer: Self-pay | Admitting: Cardiology

## 2021-10-16 ENCOUNTER — Other Ambulatory Visit: Payer: Self-pay

## 2021-10-16 DIAGNOSIS — I10 Essential (primary) hypertension: Secondary | ICD-10-CM

## 2021-10-16 DIAGNOSIS — I5032 Chronic diastolic (congestive) heart failure: Secondary | ICD-10-CM

## 2021-10-16 MED ORDER — SPIRONOLACTONE 25 MG PO TABS: 12.5000 mg | ORAL_TABLET | Freq: Every day | ORAL | 2 refills | Status: DC

## 2021-10-16 MED ORDER — LABETALOL HCL 100 MG PO TABS: 100.0000 mg | ORAL_TABLET | Freq: Two times a day (BID) | ORAL | 0 refills | Status: DC

## 2021-10-16 NOTE — Progress Notes (Signed)
? ? ?Patient referred by [name], NP for heart failure.  ? ?Subjective:  ? ?Brenda Alexander, female    DOB: [birth year], 88 y.o.   MRN: [id number] ? ? ?I connected with the patient on 10/16/2021 by a telephone call and verified that I am speaking with the correct person using two identifiers.  ?   ?I offered the patient a video enabled application for a virtual visit. Unfortunately, this could not be accomplished due to technical difficulties/lack of video enabled phone/computer. I discussed the limitations of evaluation and management by telemedicine and the availability of in person appointments. The patient expressed understanding and agreed to proceed.  ? ?This visit type was conducted due to national recommendations for restrictions regarding the COVID-19 Pandemic (e.g. social distancing).  This format is felt to be most appropriate for this patient at this time.  All issues noted in this document were discussed and addressed.  No physical exam was performed (except for noted visual exam findings with Tele health visits).  The patient has consented to conduct a Tele health visit and understands insurance will be billed.  ? ?HPI ? 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 ? ?I spoke with patient and her daughter over the phone. Patient had a recent mechanical fall while at home. BP was reportedly low when she feel, but otherwise normal. Leg swelling has returned since she has been out of spironolactone. Patient is looking at hospice care options. ? ? ?Current Outpatient Medications on File Prior to Visit  ?Medication Sig Dispense Refill  ? acetaminophen (TYLENOL) 500 MG tablet Take 1,000 mg by mouth every 6 (six) hours as needed (for pain).     ? Calcium Carb-Cholecalciferol (CALCIUM + D3 PO) Take 1 tablet by mouth daily.    ?  cloNIDine (CATAPRES) 0.2 MG tablet Take 0.2 mg by mouth 2 (two) times daily.    ? diltiazem (CARDIZEM CD) 240 MG 24 hr capsule TAKE 1 CAPSULE EVERY DAY 90 capsule 2  ? docusate sodium (COLACE) 100 MG capsule Take 1 capsule (100 mg total) by mouth 2 (two) times daily as needed for mild constipation or moderate constipation. 30 capsule 2  ? isosorbide mononitrate (ISMO,MONOKET) 20 MG tablet Take 20 mg by mouth daily.  4  ? labetalol (NORMODYNE) 100 MG tablet Take 1 tablet (100 mg total) by mouth 2 (two) times daily. (Patient taking differently: Take 100 mg by mouth 2 (two) times daily. 0.5 tab in morning and 0.5 in the evening) 180 tablet 3  ? omeprazole (PRILOSEC) 20 MG capsule     ? ondansetron (ZOFRAN) 4 MG tablet     ? senna (SENOKOT) 8.6 MG TABS tablet Take 1 tablet by mouth daily.    ? simvastatin (ZOCOR) 20 MG tablet Take 20 mg by mouth at bedtime.     ? spironolactone (ALDACTONE) 25 MG tablet TAKE 1 TABLET(25 MG) BY MOUTH DAILY 90 tablet 1  ? traMADol (ULTRAM) 50 MG tablet Take 25 mg by mouth as needed.     ? vitamin C (ASCORBIC ACID) 500 MG tablet Take 500 mg by mouth daily.    ? ferrous sulfate 325 (65 FE) MG EC tablet Take 1 tablet (325 mg total) by mouth 2 (two) times daily. 60 tablet 3  ? [DISCONTINUED] furosemide (LASIX) 40 MG tablet Take 40 mg by mouth as needed.    ? ?  No current facility-administered medications on file prior to visit.  ? ? ?Cardiovascular studies: ? ?EKG 08/07/2021: ?Sinus rhythm 53 bpm ?First degree AV block ? ?Echocardiogram 05/29/2020:  ?Normal LV systolic function with visual EF 55-60%. Left ventricle cavity  ?is normal in size. Moderate left ventricular hypertrophy. Normal global  ?wall motion. Doppler evidence of grade II diastolic dysfunction, elevated  ?LAP.  ?Left atrial cavity is severely dilated.  ?Aortic valve sclerosis without stenosis. Mild (Grade I) aortic  ?regurgitation.  ?Mild (Grade I) mitral regurgitation.  ?Compared to prior study dated 08/26/2018 no significant  changes expect DD  ?is now Grade 2 with elevated LAP compared to Grade 1 DD. ? ?EKG 05/21/2020: ?Sinus rhythm 62 bpm ?Left ventricular hypertrophy ?Left axis deviation ?Decreasing R-wave progression ?  ?EKG 09/19/2019: ?Sinus tachycardia 90 bpm. ?Significant baseline artifact. ? ?Recent labs: ?10/05/2021: ?Glucose 103, BUN/Cr 28/1.06. EGFR 51. Na/K 137/3.8.  ?H/H 10/31. MCV 88. Platelets 200 ? ?04/18/2021: ?Glucose 93, BUN/Cr 17/0.68. EGFR 74. Na/K 141/4.4.  ? ?08/06/2020: ?Glucose 108, BUN/Cr 40/1.31. EGFR 40. Na/K 136/5.1.  ?H/H 11/34. MCV 87. Platelets 203 ? ? ?08/30/2018: ?H/H 10.6/35.2. MCV 82. Platelets 298 ? ? ?Review of Systems  ?Cardiovascular:  Positive for leg swelling (Improved). Negative for chest pain, dyspnea on exertion, palpitations and syncope.  ? ?   ?Vitals:  ? 10/16/21 1338  ?BP: 139/77  ?Pulse: 65  ? ? ? ? ?Objective:  ? Physical exam not performed, telephone visit ? ?  ICD-10-CM   ?1. Essential hypertension  I10 spironolactone (ALDACTONE) 25 MG tablet  ?  labetalol (NORMODYNE) 100 MG tablet  ?  ?2. Chronic heart failure with preserved ejection fraction (HCC)  I50.32 spironolactone (ALDACTONE) 25 MG tablet  ?  ? ? ?   ?Assessment & Recommendations:  ? 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  ? ?Essential hypertension: ?I will consolidate her regimen as follows. ?Stop diltiazem 240 mg., ?Increase labetalol from 50 mg bid to 100 mg bid. ?Add spironolactone 12.5 mg daily. ?Okay not check labs given low dose. ? ?HFpEF:  ?Added spironolactone ? ?F/u 6 months (virtual visit) ? ?Nigel Mormon, MD ?[facility] ?Pager: [phone] ?Office: [phone] ?If no answer Cell [phone] ?   ?

## 2021-10-27 ENCOUNTER — Emergency Department (HOSPITAL_COMMUNITY): Payer: Medicare HMO

## 2021-10-27 ENCOUNTER — Observation Stay (HOSPITAL_COMMUNITY)
Admission: EM | Admit: 2021-10-27 | Discharge: 2021-11-04 | Disposition: A | Discharge status: Skilled Nursing Facility | Payer: Medicare HMO | Attending: Internal Medicine | Admitting: Internal Medicine

## 2021-10-27 ENCOUNTER — Encounter (HOSPITAL_COMMUNITY): Payer: Self-pay

## 2021-10-27 DIAGNOSIS — K4030 Unilateral inguinal hernia, with obstruction, without gangrene, not specified as recurrent: Secondary | ICD-10-CM | POA: Diagnosis not present

## 2021-10-27 DIAGNOSIS — I110 Hypertensive heart disease with heart failure: Secondary | ICD-10-CM | POA: Insufficient documentation

## 2021-10-27 DIAGNOSIS — R296 Repeated falls: Secondary | ICD-10-CM | POA: Diagnosis not present

## 2021-10-27 DIAGNOSIS — E119 Type 2 diabetes mellitus without complications: Secondary | ICD-10-CM | POA: Diagnosis not present

## 2021-10-27 DIAGNOSIS — R262 Difficulty in walking, not elsewhere classified: Secondary | ICD-10-CM | POA: Insufficient documentation

## 2021-10-27 DIAGNOSIS — R531 Weakness: Secondary | ICD-10-CM | POA: Diagnosis present

## 2021-10-27 DIAGNOSIS — R4182 Altered mental status, unspecified: Secondary | ICD-10-CM

## 2021-10-27 DIAGNOSIS — I5032 Chronic diastolic (congestive) heart failure: Secondary | ICD-10-CM | POA: Diagnosis present

## 2021-10-27 DIAGNOSIS — R55 Syncope and collapse: Secondary | ICD-10-CM

## 2021-10-27 DIAGNOSIS — I5030 Unspecified diastolic (congestive) heart failure: Secondary | ICD-10-CM | POA: Insufficient documentation

## 2021-10-27 DIAGNOSIS — I10 Essential (primary) hypertension: Secondary | ICD-10-CM | POA: Diagnosis present

## 2021-10-27 DIAGNOSIS — W19XXXD Unspecified fall, subsequent encounter: Secondary | ICD-10-CM

## 2021-10-27 DIAGNOSIS — M6282 Rhabdomyolysis: Secondary | ICD-10-CM

## 2021-10-27 DIAGNOSIS — E785 Hyperlipidemia, unspecified: Secondary | ICD-10-CM

## 2021-10-27 DIAGNOSIS — W07XXXA Fall from chair, initial encounter: Secondary | ICD-10-CM | POA: Diagnosis not present

## 2021-10-27 DIAGNOSIS — I2510 Atherosclerotic heart disease of native coronary artery without angina pectoris: Secondary | ICD-10-CM | POA: Insufficient documentation

## 2021-10-27 DIAGNOSIS — Z79899 Other long term (current) drug therapy: Secondary | ICD-10-CM | POA: Diagnosis not present

## 2021-10-27 LAB — URINALYSIS, ROUTINE W REFLEX MICROSCOPIC
Bacteria, UA: NONE SEEN
Bilirubin Urine: NEGATIVE
Glucose, UA: NEGATIVE mg/dL
Ketones, ur: NEGATIVE mg/dL
Leukocytes,Ua: NEGATIVE
Nitrite: NEGATIVE
Protein, ur: 100 mg/dL — AB
Specific Gravity, Urine: 1.01 (ref 1.005–1.030)
pH: 5 (ref 5.0–8.0)

## 2021-10-27 LAB — COMPREHENSIVE METABOLIC PANEL
ALT: 15 U/L (ref 0–44)
AST: 41 U/L (ref 15–41)
Albumin: 4.1 g/dL (ref 3.5–5.0)
Alkaline Phosphatase: 74 U/L (ref 38–126)
Anion gap: 9 (ref 5–15)
BUN: 21 mg/dL (ref 8–23)
CO2: 27 mmol/L (ref 22–32)
Calcium: 9.7 mg/dL (ref 8.9–10.3)
Chloride: 96 mmol/L — ABNORMAL LOW (ref 98–111)
Creatinine, Ser: 0.89 mg/dL (ref 0.44–1.00)
GFR, Estimated: >60 mL/min (ref >60)
Glucose, Bld: 97 mg/dL (ref 70–99)
Potassium: 3.5 mmol/L (ref 3.5–5.1)
Sodium: 132 mmol/L — ABNORMAL LOW (ref 135–145)
Total Bilirubin: 0.8 mg/dL (ref 0.3–1.2)
Total Protein: 7.2 g/dL (ref 6.5–8.1)

## 2021-10-27 LAB — CBC WITH DIFFERENTIAL/PLATELET
Abs Immature Granulocytes: 0.02 10*3/uL (ref 0.00–0.07)
Basophils Absolute: 0 10*3/uL (ref 0.0–0.1)
Basophils Relative: 0 %
Eosinophils Absolute: 0 10*3/uL (ref 0.0–0.5)
Eosinophils Relative: 0 %
HCT: 38.9 % (ref 36.0–46.0)
Hemoglobin: 12.7 g/dL (ref 12.0–15.0)
Immature Granulocytes: 0 %
Lymphocytes Relative: 11 %
Lymphs Abs: 0.6 10*3/uL — ABNORMAL LOW (ref 0.7–4.0)
MCH: 28 pg (ref 26.0–34.0)
MCHC: 32.6 g/dL (ref 30.0–36.0)
MCV: 85.7 fL (ref 80.0–100.0)
Monocytes Absolute: 0.7 10*3/uL (ref 0.1–1.0)
Monocytes Relative: 13 %
Neutro Abs: 3.9 10*3/uL (ref 1.7–7.7)
Neutrophils Relative %: 76 %
Platelets: 232 10*3/uL (ref 150–400)
RBC: 4.54 MIL/uL (ref 3.87–5.11)
RDW: 13.9 % (ref 11.5–15.5)
WBC: 5.2 10*3/uL (ref 4.0–10.5)
nRBC: 0 % (ref 0.0–0.2)

## 2021-10-27 LAB — TROPONIN I (HIGH SENSITIVITY)
Troponin I (High Sensitivity): 37 ng/L — ABNORMAL HIGH (ref <18)
Troponin I (High Sensitivity): 36 ng/L — ABNORMAL HIGH (ref <18)

## 2021-10-27 LAB — CBG MONITORING, ED: Glucose-Capillary: 96 mg/dL (ref 70–99)

## 2021-10-27 LAB — CK: Total CK: 844 U/L — ABNORMAL HIGH (ref 38–234)

## 2021-10-27 MED ORDER — ISOSORBIDE MONONITRATE 10 MG PO TABS
20.0000 mg | ORAL_TABLET | Freq: Every day | ORAL | Status: DC
  Administered 2021-10-27 – 2021-11-04 (×8): 20 mg via ORAL
  Filled 2021-10-27 (×9): qty 2

## 2021-10-27 MED ORDER — SPIRONOLACTONE 12.5 MG HALF TABLET
12.5000 mg | ORAL_TABLET | Freq: Every day | ORAL | Status: DC
  Administered 2021-10-27 – 2021-11-04 (×8): 12.5 mg via ORAL
  Filled 2021-10-27 (×9): qty 1

## 2021-10-27 MED ORDER — PANTOPRAZOLE SODIUM 40 MG PO TBEC
40.0000 mg | DELAYED_RELEASE_TABLET | Freq: Every day | ORAL | Status: DC
  Administered 2021-10-27 – 2021-11-04 (×8): 40 mg via ORAL
  Filled 2021-10-27 (×8): qty 1

## 2021-10-27 MED ORDER — SODIUM CHLORIDE 0.9 % IV SOLN: Freq: Once | INTRAVENOUS | Status: AC

## 2021-10-27 MED ORDER — SODIUM CHLORIDE 0.9% FLUSH
3.0000 mL | Freq: Two times a day (BID) | INTRAVENOUS | Status: DC
  Administered 2021-10-28 – 2021-11-04 (×13): 3 mL via INTRAVENOUS

## 2021-10-27 MED ORDER — ACETAMINOPHEN 325 MG PO TABS
650.0000 mg | ORAL_TABLET | Freq: Four times a day (QID) | ORAL | Status: DC | PRN
  Administered 2021-10-27 – 2021-10-30 (×4): 650 mg via ORAL
  Filled 2021-10-27 (×4): qty 2

## 2021-10-27 MED ORDER — ONDANSETRON HCL 4 MG/2ML IJ SOLN
4.0000 mg | Freq: Four times a day (QID) | INTRAMUSCULAR | Status: DC | PRN
  Administered 2021-10-27: 4 mg via INTRAVENOUS
  Filled 2021-10-27: qty 2

## 2021-10-27 MED ORDER — SIMVASTATIN 20 MG PO TABS
20.0000 mg | ORAL_TABLET | Freq: Every day | ORAL | Status: DC
  Administered 2021-10-27 – 2021-11-03 (×8): 20 mg via ORAL
  Filled 2021-10-27 (×8): qty 1

## 2021-10-27 MED ORDER — ONDANSETRON HCL 4 MG PO TABS: 4.0000 mg | ORAL_TABLET | Freq: Four times a day (QID) | ORAL | Status: DC | PRN

## 2021-10-27 MED ORDER — ALUM & MAG HYDROXIDE-SIMETH 200-200-20 MG/5ML PO SUSP
15.0000 mL | Freq: Four times a day (QID) | ORAL | Status: DC | PRN
  Administered 2021-10-27: 15 mL via ORAL
  Filled 2021-10-27: qty 30

## 2021-10-27 MED ORDER — HYDRALAZINE HCL 20 MG/ML IJ SOLN
10.0000 mg | Freq: Three times a day (TID) | INTRAMUSCULAR | Status: DC | PRN
  Administered 2021-10-27 – 2021-10-31 (×2): 10 mg via INTRAVENOUS
  Filled 2021-10-27 (×2): qty 1

## 2021-10-27 MED ORDER — CLONIDINE HCL 0.2 MG PO TABS
0.2000 mg | ORAL_TABLET | Freq: Two times a day (BID) | ORAL | Status: DC
  Administered 2021-10-27 – 2021-11-04 (×15): 0.2 mg via ORAL
  Filled 2021-10-27 (×16): qty 1

## 2021-10-27 MED ORDER — ENOXAPARIN SODIUM 40 MG/0.4ML IJ SOSY
40.0000 mg | PREFILLED_SYRINGE | INTRAMUSCULAR | Status: DC
  Administered 2021-10-27 – 2021-11-03 (×8): 40 mg via SUBCUTANEOUS
  Filled 2021-10-27 (×8): qty 0.4

## 2021-10-27 NOTE — ED Notes (Signed)
Purewik applied to pt. Tolerated well. ?

## 2021-10-27 NOTE — ED Provider Notes (Signed)
?[facility] DEPT ?Provider Note ? ? ?CSN: [id number] ?Arrival date & time: 10/27/21  1046 ? ?  ? ?History ? ?Chief Complaint  ?Patient presents with  ? Fall  ? Weakness  ? ? ?Brenda Alexander is a 88 y.o. female.  With past medical history of hypertension, coronary artery disease, HFpEF, type 2 diabetes who presents emergency department for fall and weakness. ? ?Per EMS patient had unwitnessed fall last night attempting to get into her recliner and fell to the ground.  She was unable to get up and found by family this morning. ? ?Per the patient she states that yesterday she was going to sit into her recliner and missed it.  She states that she slid off the recliner and down onto the floor.  She denies hitting her head or loss of consciousness.  She is not anticoagulated.  States that she landed on her bottom.  She states that she is having pain in her bilateral knees that she describes as sore.  She states that she feels weak in her legs "for a good while now."  She denies any chest pain, shortness of breath, abdominal pain, urinary symptoms, lightheadedness or dizziness, recent cough or fevers. ? ?Daughter at bedside, Michela Pitcher, who states that patient did fall last night.  She states that it appears that her walker was far away from her.  She also states she thinks that she crawled to the bathroom at some point.  She states that she attempted to lift her off the floor but was unable to and the patient was unable to stand on her legs.  She states that she has had multiple falls recently and it is not unusual if she were to fall that she would not be able to get up on her own.  She is in the process of finding SNF placement for patient.  ? ? ?Fall ?Pertinent negatives include no chest pain, no abdominal pain and no shortness of breath.  ?Weakness ?Associated symptoms: arthralgias and myalgias   ?Associated symptoms: no abdominal pain, no chest pain, no cough, no dizziness, no dysuria, no fever,  no nausea, no shortness of breath and no vomiting   ? ?  ? ?Home Medications ?Prior to Admission medications   ?Medication Sig Start Date End Date Taking? Authorizing Provider  ?acetaminophen (TYLENOL) 500 MG tablet Take 1,000 mg by mouth every 6 (six) hours as needed (for pain).     [provider]  ?Calcium Carb-Cholecalciferol (CALCIUM + D3 PO) Take 1 tablet by mouth daily.    [provider]  ?cloNIDine (CATAPRES) 0.2 MG tablet Take 0.2 mg by mouth 2 (two) times daily. 08/06/21   [provider]  ?docusate sodium (COLACE) 100 MG capsule Take 1 capsule (100 mg total) by mouth 2 (two) times daily as needed for mild constipation or moderate constipation. 05/06/18   Anyanwu, Sallyanne Havers, MD  ?ferrous sulfate 325 (65 FE) MG EC tablet Take 1 tablet (325 mg total) by mouth 2 (two) times daily. 01/13/18 10/11/21  Elgergawy, Silver Huguenin, MD  ?isosorbide mononitrate (ISMO,MONOKET) 20 MG tablet Take 20 mg by mouth daily. 12/23/17   [provider]  ?labetalol (NORMODYNE) 100 MG tablet Take 1 tablet (100 mg total) by mouth 2 (two) times daily. 10/16/21   Patwardhan, Reynold Bowen, MD  ?omeprazole (PRILOSEC) 20 MG capsule  03/01/20   [provider]  ?ondansetron (ZOFRAN) 4 MG tablet  07/09/21   [provider]  ?senna (SENOKOT) 8.6 MG  TABS tablet Take 1 tablet by mouth daily.    [provider]  ?simvastatin (ZOCOR) 20 MG tablet Take 20 mg by mouth at bedtime.     [provider]  ?spironolactone (ALDACTONE) 25 MG tablet Take 0.5 tablets (12.5 mg total) by mouth daily. 10/16/21   Patwardhan, Reynold Bowen, MD  ?traMADol (ULTRAM) 50 MG tablet Take 25 mg by mouth as needed.  03/01/20   [provider]  ?vitamin C (ASCORBIC ACID) 500 MG tablet Take 500 mg by mouth daily.    [provider]  ?furosemide (LASIX) 40 MG tablet Take 40 mg by mouth as needed.  08/06/20  [provider]  ?   ? ?Allergies    ?Other and Penicillins   ? ?Review of Systems   ?Review  of Systems  ?Constitutional:  Positive for fatigue. Negative for fever.  ?Respiratory:  Negative for cough and shortness of breath.   ?Cardiovascular:  Negative for chest pain.  ?Gastrointestinal:  Negative for abdominal pain, nausea and vomiting.  ?Genitourinary:  Negative for dysuria.  ?Musculoskeletal:  Positive for arthralgias and myalgias.  ?Neurological:  Positive for weakness. Negative for dizziness, syncope and light-headedness.  ?All other systems reviewed and are negative. ? ?Physical Exam ?Updated Vital Signs ?BP (!) 167/79 (BP Location: Right Arm)   Pulse 72   Temp 98.8 ?F (37.1 ?C) (Oral)   Resp (!) 21   LMP  (LMP Unknown)   SpO2 100%  ?Physical Exam ?Vitals and nursing note reviewed.  ?Constitutional:   ?   General: She is not in acute distress. ?   Appearance: Normal appearance. She is not ill-appearing or toxic-appearing.  ?HENT:  ?   Head: Normocephalic and atraumatic.  ?   Nose: Nose normal.  ?   Mouth/Throat:  ?   Mouth: Mucous membranes are moist.  ?   Pharynx: Oropharynx is clear.  ?Eyes:  ?   General: No scleral icterus. ?   Extraocular Movements: Extraocular movements intact.  ?   Pupils: Pupils are equal, round, and reactive to light.  ?Cardiovascular:  ?   Rate and Rhythm: Normal rate and regular rhythm.  ?   Pulses: Normal pulses.  ?   Heart sounds: No murmur heard. ?Pulmonary:  ?   Effort: Pulmonary effort is normal. No respiratory distress.  ?   Breath sounds: Normal breath sounds.  ?Abdominal:  ?   General: Bowel sounds are normal. There is no distension.  ?   Palpations: Abdomen is soft.  ?   Tenderness: There is no abdominal tenderness.  ?Musculoskeletal:     ?   General: Tenderness present. No swelling or deformity.  ?   Cervical back: Normal range of motion and neck supple. Tenderness present. No rigidity.  ?   Right hip: Tenderness present.  ?   Left hip: Tenderness present.  ?   Right knee: Decreased range of motion. Tenderness present.  ?   Left knee: Decreased range of  motion. Tenderness present.  ?   Right lower leg: 2+ Edema present.  ?   Left lower leg: 2+ Edema present.  ?Skin: ?   General: Skin is warm and dry.  ?   Capillary Refill: Capillary refill takes less than 2 seconds.  ?   Findings: No bruising or rash.  ?Neurological:  ?   General: No focal deficit present.  ?   Mental Status: She is alert and oriented to person, place, and time. Mental status is at baseline.  ?  Cranial Nerves: No cranial nerve deficit.  ?   Sensory: No sensory deficit.  ?   Motor: No weakness.  ?   Comments: Somewhat drowsy   ?Psychiatric:     ?   Mood and Affect: Mood normal.     ?   Behavior: Behavior normal.     ?   Thought Content: Thought content normal.     ?   Judgment: Judgment normal.  ? ? ?ED Results / Procedures / Treatments   ?Labs ?(all labs ordered are listed, but only abnormal results are displayed) ?Labs Reviewed  ?COMPREHENSIVE METABOLIC PANEL - Abnormal; Notable for the following components:  ?    Result Value  ? Sodium 132 (*)   ? Chloride 96 (*)   ? All other components within normal limits  ?CBC WITH DIFFERENTIAL/PLATELET - Abnormal; Notable for the following components:  ? Lymphs Abs 0.6 (*)   ? All other components within normal limits  ?URINALYSIS, ROUTINE W REFLEX MICROSCOPIC - Abnormal; Notable for the following components:  ? Hgb urine dipstick MODERATE (*)   ? Protein, ur 100 (*)   ? All other components within normal limits  ?CK - Abnormal; Notable for the following components:  ? Total CK 844 (*)   ? All other components within normal limits  ?TROPONIN I (HIGH SENSITIVITY) - Abnormal; Notable for the following components:  ? Troponin I (High Sensitivity) 37 (*)   ? All other components within normal limits  ?TROPONIN I (HIGH SENSITIVITY) - Abnormal; Notable for the following components:  ? Troponin I (High Sensitivity) 36 (*)   ? All other components within normal limits  ?CBG MONITORING, ED  ? ?EKG ?None ? ?Radiology ?DG Pelvis 1-2 Views ? ?Result Date:  10/27/2021 ?CLINICAL DATA:  Fall EXAM: PELVIS - 1-2 VIEW COMPARISON:  No prior pelvic radiographs, correlation is made with 08/25/2018 CT abdomen pelvis. FINDINGS: No definite acute fracture on this single view of the pelvi

## 2021-10-27 NOTE — ED Triage Notes (Addendum)
Pt arrived via EMS, from home, unwitnessed fall last night, states was trying to get into recliner and fell to ground. On the ground approx 14 hrs, found by family this morning.  ? ?States chronic knee pain worsening, increasing weakness.  ? ? ?

## 2021-10-27 NOTE — H&P (Signed)
?History and Physical  ? ? ?Patient: Brenda Alexander JIR:[id number] DOB: 06-Apr-1933 ?DOA: 10/27/2021 ?DOS: the patient was seen and examined on 10/27/2021 ?PCP: [name], NP  ?Patient coming from: Home ? ?Chief Complaint:  ?Chief Complaint  ?Patient presents with  ? Fall  ? Weakness  ? ?HPI: Brenda Alexander is a 88 y.o. female with medical history significant of DM2, HTN, chronic HFpEF. Presenting after a fall. History is from son at bedside as the patient is very drowsy. She apparently was able to relay her story to the EDPA earlier. The patient was trying to transfer from a recliner last night and apparently slid to the floor. She was unable to get up on her own. She didn't hit her head. She tried to call her daughter, but her daughter was unable to pick up the phone. Family check on her this morning and saw that she had been on the floor all night. She was able to tell them what happened. They became concerned and called for EMS.   ? ?Review of Systems: unable to review all systems due to the inability of the patient to answer questions. ?Past Medical History:  ?Diagnosis Date  ? Anemia   ? Arthritis   ? knees, hands  ? Coronary artery disease   ? Diabetes mellitus without complication ([place])   ? no meds  ? Dyspnea 01/12/2018  ? w/anemia dx and hospital admit w/blood transfused   ? 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 History of blood transfusion 01/12/2018  ? at [facility]  ? Hyperlipemia   ? Hypertension   ? SVD (spontaneous vaginal delivery)   ? x 6 - 5 living children  ? Uses walker   ? rollator  ? Wears partial dentures   ? upper and lower partials  ? ?Past Surgical History:  ?Procedure Laterality Date  ? COLONOSCOPY    ? EYE SURGERY    ? cataracts removed right eye  ? HYSTEROSCOPY WITH D & C N/A 05/06/2018  ? Procedure: DILATATION AND CURETTAGE /HYSTEROSCOPY;  Surgeon: [name], MD;  Location: [facility];  Service: Gynecology;  Laterality: N/A;  ? LAPAROSCOPY N/A 08/26/2018  ? Procedure:  LAPAROSCOPY DIAGNOSTIC;  Surgeon: [name], MD;  Location: [facility];  Service: General;  Laterality: N/A;  ? MULTIPLE TOOTH EXTRACTIONS    ? ROBOTIC ASSISTED TOTAL HYSTERECTOMY WITH BILATERAL SALPINGO OOPHERECTOMY Bilateral 05/24/2018  ? Procedure: XI ROBOTIC ASSISTED TOTAL HYSTERECTOMY WITH BILATERAL SALPINGO OOPHORECTOMY;  Surgeon: [name], MD;  Location: [name];  Service: Gynecology;  Laterality: Bilateral;  ? SENTINEL NODE BIOPSY N/A 05/24/2018  ? Procedure: SENTINEL LYMP  NODE BIOPSY;  Surgeon: [name], MD;  Location: [name];  Service: Gynecology;  Laterality: N/A;  ? ?Social History:  reports that she has never smoked. She has never used smokeless tobacco. She reports that she does not currently use alcohol. She reports that she does not use drugs. ? ?Allergies  ?Allergen Reactions  ? Other Anaphylaxis and Swelling  ?  Walnuts = Throat swells  ? Penicillins Swelling and Rash  ?  Has patient had a PCN reaction causing immediate rash, facial/tongue/throat swelling, SOB or lightheadedness with hypotension: Yes ?Has patient had a PCN reaction causing severe rash involving mucus membranes or skin necrosis: Unk ?Has patient had a PCN reaction that required hospitalization: Unk ?Has patient had a PCN reaction occurring within the last 10 years: No ?If all of the above answers are "NO", then may proceed with Cephalosporin use. ?  ? ? ?  Family History  ?Problem Relation Age of Onset  ? Obesity Son   ? Hypertension Father   ? Stroke Father   ? Hypertension Sister   ? Heart failure Sister   ? Hypertension Brother   ? Hypertension Sister   ? Hypertension Sister   ? ? ?Prior to Admission medications   ?Medication Sig Start Date End Date Taking? Authorizing Provider  ?acetaminophen (TYLENOL) 500 MG tablet Take 1,000 mg by mouth every 6 (six) hours as needed (for pain).    Yes [provider]  ?Calcium Carb-Cholecalciferol (CALCIUM + D3 PO) Take 1 tablet by mouth daily.   Yes [provider]   ?cloNIDine (CATAPRES) 0.2 MG tablet Take 0.2 mg by mouth 2 (two) times daily. 08/06/21  Yes [provider]  ?docusate sodium (COLACE) 100 MG capsule Take 1 capsule (100 mg total) by mouth 2 (two) times daily as needed for mild constipation or moderate constipation. 05/06/18  Yes Anyanwu, Sallyanne Havers, MD  ?ferrous sulfate 325 (65 FE) MG EC tablet Take 1 tablet (325 mg total) by mouth 2 (two) times daily. ?Patient taking differently: Take 325 mg by mouth daily with breakfast. 01/13/18 10/27/21 Yes Elgergawy, Silver Huguenin, MD  ?isosorbide mononitrate (ISMO,MONOKET) 20 MG tablet Take 20 mg by mouth daily. 12/23/17  Yes [provider]  ?labetalol (NORMODYNE) 100 MG tablet Take 1 tablet (100 mg total) by mouth 2 (two) times daily. ?Patient taking differently: Take 50 mg by mouth 2 (two) times daily. 10/16/21  Yes Patwardhan, Manish J, MD  ?Multiple Vitamins-Minerals (MULTI FOR HER 50+) TABS Take 1 tablet by mouth daily.   Yes [provider]  ?omeprazole (PRILOSEC) 20 MG capsule 20 mg daily. 03/01/20  Yes [provider]  ?ondansetron (ZOFRAN) 4 MG tablet Take 4 mg by mouth every 8 (eight) hours as needed for nausea or vomiting. 07/09/21  Yes [provider]  ?senna (SENOKOT) 8.6 MG TABS tablet Take 1 tablet by mouth daily as needed for mild constipation.   Yes [provider]  ?simvastatin (ZOCOR) 20 MG tablet Take 20 mg by mouth at bedtime.    Yes [provider]  ?spironolactone (ALDACTONE) 25 MG tablet Take 0.5 tablets (12.5 mg total) by mouth daily. 10/16/21  Yes Patwardhan, Reynold Bowen, MD  ?traMADol (ULTRAM) 50 MG tablet Take 50 mg by mouth every 12 (twelve) hours as needed for moderate pain. 03/01/20  Yes [provider]  ?vitamin C (ASCORBIC ACID) 500 MG tablet Take 500 mg by mouth daily.   Yes [provider]  ?furosemide (LASIX) 40 MG tablet Take 40 mg by mouth as needed.  08/06/20  [provider]  ? ? ?Physical Exam: ?Vitals:  ? 10/27/21  1230 10/27/21 1307 10/27/21 1345 10/27/21 1419  ?BP: (!) 143/80 (!) 183/91 (!) 157/75 (!) 177/78  ?Pulse: 80 72 63 88  ?Resp: '18 17 17 17  '$ ?Temp:      ?TempSrc:      ?SpO2: 99% 99% 99% 99%  ? ?General: 88 y.o. female resting in bed in NAD ?Eyes: PERRL, normal sclera ?ENMT: Nares patent w/o discharge, orophaynx clear, dentition normal, ears w/o discharge/lesions/ulcers ?Neck: Supple, trachea midline ?Cardiovascular: RRR, +S1, S2, no g/r, 4/6 SEM, equal pulses throughout ?Respiratory: CTABL, no w/r/r, normal WOB ?GI: BS+, NDNT, no masses noted, no organomegaly noted ?MSK: No c/c; trace b/l pedal edema ?Neuro: A&O x name, no focal deficits but she is drowsy ? ?Data Reviewed: ? ?Na+ 132 ?BNP  844 ?Trp 37 ->  36 ?WBC  5.2 ? ?EKG: sinus, no st elevations; prolong QT ?CTH/CT cspine:  ?1. No acute intracranial abnormality. ?2. No acute fracture or subluxation of the cervical spine. ?3. Advanced facet arthropathy on the left at C2-3 with slight ?widening of the joint space and erosions suggesting an underlying inflammatory or crystalline arthropathy. ?4. Moderate chronic microvascular ischemic change and diffuse cerebral volume loss. ? ?CXR:  ?1. Low lung volumes with mildly increased bibasilar interstitial ?markings which could be due to atelectasis or early infiltrate. ?2. Mild cardiomegaly. ? ?Assessment and Plan: ?No notes have been filed under this hospital service. ?Service: Hospitalist ?Unwitnessed Fall ?Rhabdomyolisis ?Near syncope? ?    - place in obs, tele ?    - imaging is negative for fracture, CTH is negative for acute bleed/stroke ?    - she is somnolent during my exam, but was able to communicate with the EDPA clearly per her note ?    - will have PT/OT evaluate ?    - trp are flat: 37 -> 36; EKG as above ?    - check echo, AM CPK ? ?Chronic HFpEF ?    - continue home regimen ? ?HTN ?    - continue home regimen ? ?DM2 ?    - diet controlled ?    - DM diet, A1c  ? ?HLD ?    - continue home regimen ? ? Advance  Care Planning:   Code Status: DNR ? ?Consults: None ? ?Family Communication: w/ son at bedside ? ?Severity of Illness: ?The appropriate patient status for this patient is OBSERVATION. Observation status is j

## 2021-10-27 NOTE — ED Notes (Signed)
Patient's daughter to be called at time of discharge. [phone] ?

## 2021-10-27 NOTE — ED Notes (Signed)
Patient is resting comfortably with adequate chest rise and fall. Family at bedside. ?

## 2021-10-27 NOTE — Progress Notes (Signed)
Patient 's positive orthostatic BP, and elevated heart rate  up to 120s at times, Dr. Marylyn Ishihara paged and night on call also made aware, reported off to night shift nurse to F/U with plan of care.  ? ? 10/27/21 1824  ?Assess: MEWS Score  ?Temp 99.2 ?F (37.3 ?C)  ?BP 129/70  ?Pulse Rate (!) 119  ?Resp 20  ?Level of Consciousness Alert  ?SpO2 100 %  ?O2 Device Room Air  ?Assess: MEWS Score  ?MEWS Temp 0  ?MEWS Systolic 0  ?MEWS Pulse 2  ?MEWS RR 0  ?MEWS LOC 0  ?MEWS Score 2  ?MEWS Score Color Yellow  ?Assess: if the MEWS score is Yellow or Red  ?Were vital signs taken at a resting state? Yes  ?Focused Assessment No change from prior assessment  ?Does the patient meet 2 or more of the SIRS criteria? Yes  ?Does the patient have a confirmed or suspected source of infection? No  ?MEWS guidelines implemented *See Row Information* No, previously yellow, continue vital signs every 4 hours  ?Notify: Provider  ?Provider Name/Title Dr. Marylyn Ishihara  ?Date Provider Notified 10/27/21  ?Notification Type Page  ?Notification Reason Other (Comment) ?(Pulse and positive orthostatic BP)  ?Provider response No new orders  ?Assess: SIRS CRITERIA  ?SIRS Temperature  0  ?SIRS Pulse 1  ?SIRS Respirations  0  ?SIRS WBC 0  ?SIRS Score Sum  1  ? ? ?

## 2021-10-28 ENCOUNTER — Other Ambulatory Visit: Payer: Self-pay

## 2021-10-28 ENCOUNTER — Observation Stay (HOSPITAL_BASED_OUTPATIENT_CLINIC_OR_DEPARTMENT_OTHER): Payer: Medicare HMO

## 2021-10-28 DIAGNOSIS — I10 Essential (primary) hypertension: Secondary | ICD-10-CM

## 2021-10-28 DIAGNOSIS — R55 Syncope and collapse: Secondary | ICD-10-CM | POA: Diagnosis not present

## 2021-10-28 DIAGNOSIS — K4030 Unilateral inguinal hernia, with obstruction, without gangrene, not specified as recurrent: Secondary | ICD-10-CM | POA: Diagnosis not present

## 2021-10-28 LAB — ECHOCARDIOGRAM COMPLETE
AR max vel: 2.11 cm2
AV Area VTI: 2.06 cm2
AV Area mean vel: 2.16 cm2
AV Mean grad: 12.5 mmHg
AV Peak grad: 25 mmHg
Ao pk vel: 2.5 m/s
Area-P 1/2: 1.71 cm2
Height: 62 in
MV VTI: 2.85 cm2
P 1/2 time: 538 msec
S' Lateral: 1.9 cm
Weight: 2380.97 oz

## 2021-10-28 LAB — CBC
HCT: 33.9 % — ABNORMAL LOW (ref 36.0–46.0)
Hemoglobin: 10.9 g/dL — ABNORMAL LOW (ref 12.0–15.0)
MCH: 27.8 pg (ref 26.0–34.0)
MCHC: 32.2 g/dL (ref 30.0–36.0)
MCV: 86.5 fL (ref 80.0–100.0)
Platelets: 231 10*3/uL (ref 150–400)
RBC: 3.92 MIL/uL (ref 3.87–5.11)
RDW: 14 % (ref 11.5–15.5)
WBC: 4.8 10*3/uL (ref 4.0–10.5)
nRBC: 0 % (ref 0.0–0.2)

## 2021-10-28 LAB — COMPREHENSIVE METABOLIC PANEL
ALT: 14 U/L (ref 0–44)
AST: 31 U/L (ref 15–41)
Albumin: 3.4 g/dL — ABNORMAL LOW (ref 3.5–5.0)
Alkaline Phosphatase: 66 U/L (ref 38–126)
Anion gap: 8 (ref 5–15)
BUN: 26 mg/dL — ABNORMAL HIGH (ref 8–23)
CO2: 28 mmol/L (ref 22–32)
Calcium: 9.2 mg/dL (ref 8.9–10.3)
Chloride: 98 mmol/L (ref 98–111)
Creatinine, Ser: 1.05 mg/dL — ABNORMAL HIGH (ref 0.44–1.00)
GFR, Estimated: 52 mL/min — ABNORMAL LOW (ref >60)
Glucose, Bld: 90 mg/dL (ref 70–99)
Potassium: 3.5 mmol/L (ref 3.5–5.1)
Sodium: 134 mmol/L — ABNORMAL LOW (ref 135–145)
Total Bilirubin: 0.7 mg/dL (ref 0.3–1.2)
Total Protein: 6 g/dL — ABNORMAL LOW (ref 6.5–8.1)

## 2021-10-28 LAB — MAGNESIUM: Magnesium: 2.4 mg/dL (ref 1.7–2.4)

## 2021-10-28 LAB — CK: Total CK: 410 U/L — ABNORMAL HIGH (ref 38–234)

## 2021-10-28 LAB — GLUCOSE, CAPILLARY: Glucose-Capillary: 94 mg/dL (ref 70–99)

## 2021-10-28 MED ORDER — POTASSIUM CHLORIDE CRYS ER 20 MEQ PO TBCR
40.0000 meq | EXTENDED_RELEASE_TABLET | Freq: Once | ORAL | Status: AC
  Administered 2021-10-28: 40 meq via ORAL
  Filled 2021-10-28: qty 2

## 2021-10-28 MED ORDER — SODIUM CHLORIDE 0.9 % IV SOLN: Freq: Once | INTRAVENOUS | Status: AC

## 2021-10-28 NOTE — Progress Notes (Signed)
*  PRELIMINARY RESULTS* ?Echocardiogram ?2D Echocardiogram has been performed. ? ?Brenda Alexander ?10/28/2021, 11:57 AM ?

## 2021-10-28 NOTE — Evaluation (Addendum)
Physical Therapy Evaluation ?Patient Details ?Name: Brenda Alexander ?MRN: [id number] ?DOB: 09-13-1932 ?Today's Date: 10/28/2021 ? ?History of Present Illness ? 88 y.o. female with medical history significant of DM2, HTN, chronic HFpEF. Presenting after a fall. Dx of rhabdomyolisis. Pt had ED visit 10/05/21 for a fall.  ?Clinical Impression ? Pt admitted with above diagnosis. Per RN, pt required +2 max assist for bed to recliner transfer. In this session we utilized a Stedy for sit to stand x 2 trials with min assist. Pt was able to stand for ~90 seconds, she denied dizziness in standing. Noted pt had positive orthostatics yesterday. Today seated BP was 166/75, standing was 185/103. Pt's daughter stated pt has her BP monitored at home and results are sent to her Dr, she stated pt's BP has been running both low and high and that MD has been adjusting her BP medications. As this is pt's second fall in 3 weeks, and she lives alone, ST-SNF is recommended.  Pt currently with functional limitations due to the deficits listed below (see PT Problem List). Pt will benefit from skilled PT to increase their independence and safety with mobility to allow discharge to the venue listed below.   ?   ?   ? ?Recommendations for follow up therapy are one component of a multi-disciplinary discharge planning process, led by the attending physician.  Recommendations may be updated based on patient status, additional functional criteria and insurance authorization. ? ?Follow Up Recommendations Skilled nursing-short term rehab (<3 hours/day) ? ?  ?Assistance Recommended at Discharge Intermittent Supervision/Assistance  ?Patient can return home with the following ? A lot of help with walking and/or transfers;A lot of help with bathing/dressing/bathroom;Assist for transportation;Help with stairs or ramp for entrance ? ?  ?Equipment Recommendations None recommended by PT  ?Recommendations for Other Services ?    ?  ?Functional Status Assessment  Patient has had a recent decline in their functional status and demonstrates the ability to make significant improvements in function in a reasonable and predictable amount of time.  ? ?  ?Precautions / Restrictions Precautions ?Precautions: Fall ?Precaution Comments: fell just PTA, and another fall 3 weeks ago, and one 6 months ago ?Restrictions ?Weight Bearing Restrictions: No  ? ?  ? ?Mobility ? Bed Mobility ?  ?  ?  ?  ?  ?  ?  ?General bed mobility comments: up in recliner ?  ? ?Transfers ?Overall transfer level: Needs assistance ?  ?Transfers: Sit to/from Stand, Bed to chair/wheelchair/BSC ?Sit to Stand: Min assist ?Stand pivot transfers: Total assist ?  ?  ?  ?  ?General transfer comment: used Stedy (RN stated pt was a +2 max assist for bed to recliner transfer). Pt able to pull up to standing in Stedy x 2 trials with min A for placement of RUE on bar. Pt able to stand for ~90 seconds. BP sitting 166/75, standing 185/103. Noted pt had positive orthostatics yesterday wtih nursing. Daughter reports pt has had high and low BP recently, her home BP is monitored and sent to her Dr who has been adjusting her BP meds. Pt denied dizziness in standing. ?  ? ?Ambulation/Gait ?  ?  ?  ?  ?  ?  ?  ?  ? ?Stairs ?  ?  ?  ?  ?  ? ?Wheelchair Mobility ?  ? ?Modified Rankin (Stroke Patients Only) ?  ? ?  ? ?Balance Overall balance assessment: Needs assistance ?Sitting-balance support: Feet supported, No upper extremity supported ?Sitting  balance-Leahy Scale: Fair ?  ?  ?Standing balance support: Bilateral upper extremity supported ?Standing balance-Leahy Scale: Poor ?Standing balance comment: relies on BUE support ?  ?  ?  ?  ?  ?  ?  ?  ?  ?  ?  ?   ? ? ? ?Pertinent Vitals/Pain Pain Assessment ?Pain Assessment: No/denies pain  ? ? ?Home Living Family/patient expects to be discharged to:: Private residence ?Living Arrangements: Alone ?Available Help at Discharge: Personal care attendant ?Type of Home: Apartment ?Home Access:  Level entry ?  ?  ?  ?Home Layout: One level ?Home Equipment: Rollator (4 wheels);Cane - single point;Shower seat ?Additional Comments: aide 3 hours/day 5x/week  ?  ?Prior Function   ?  ?  ?  ?  ?  ?  ?Mobility Comments: uses rollator for ambulation. Had another fall 3 weeks ago, and another 6 months ago per daughter. ?ADLs Comments: family helps with transportation. aide helps daily. meals on wheels (family also helps with that) ?  ? ? ?Hand Dominance  ?   ? ?  ?Extremity/Trunk Assessment  ? Upper Extremity Assessment ?Upper Extremity Assessment: RUE deficits/detail;Defer to OT evaluation ?RUE Deficits / Details: active shoulder flexion ~45* (daughter reports this is baseline) ?  ? ?Lower Extremity Assessment ?Lower Extremity Assessment: Overall WFL for tasks assessed ?  ? ?Cervical / Trunk Assessment ?Cervical / Trunk Assessment: Normal  ?Communication  ? Communication: No difficulties  ?Cognition Arousal/Alertness: Awake/alert ?Behavior During Therapy: [facility] for tasks assessed/performed ?Overall Cognitive Status: Within Functional Limits for tasks assessed ?  ?  ?  ?  ?  ?  ?  ?  ?  ?  ?  ?  ?  ?  ?  ?  ?General Comments: daughter provided details of PLOF as pt is vague historian ?  ?  ? ?  ?General Comments   ? ?  ?Exercises    ? ?Assessment/Plan  ?  ?PT Assessment Patient needs continued PT services  ?PT Problem List Decreased mobility;Decreased activity tolerance;Decreased balance ? ?   ?  ?PT Treatment Interventions Therapeutic activities;Therapeutic exercise;Gait training;Functional mobility training;Patient/family education   ? ?PT Goals (Current goals can be found in the Care Plan section)  ?Acute Rehab PT Goals ?Patient Stated Goal: to get stronger at rehab ?PT Goal Formulation: With patient/family ?Time For Goal Achievement: 11/11/21 ?Potential to Achieve Goals: Good ? ?  ?Frequency Min 2X/week ?  ? ? ?Co-evaluation   ?  ?  ?  ?  ? ? ?  ?AM-PAC PT "6 Clicks" Mobility  ?Outcome Measure Help needed turning  from your back to your side while in a flat bed without using bedrails?: A Little ?Help needed moving from lying on your back to sitting on the side of a flat bed without using bedrails?: A Lot ?Help needed moving to and from a bed to a chair (including a wheelchair)?: Total ?Help needed standing up from a chair using your arms (e.g., wheelchair or bedside chair)?: A Lot ?Help needed to walk in hospital room?: Total ?Help needed climbing 3-5 steps with a railing? : Total ?6 Click Score: 10 ? ?  ?End of Session Equipment Utilized During Treatment: Gait belt ?Activity Tolerance: Patient tolerated treatment well ?Patient left: in chair;with call bell/phone within reach;with family/visitor present;with chair alarm set ?Nurse Communication: Mobility status;Need for lift equipment ?PT Visit Diagnosis: Difficulty in walking, not elsewhere classified (R26.2);Repeated falls (R29.6) ?  ? ?Time: 0950-1020 ?PT Time Calculation (min) (ACUTE ONLY): 30  min ? ? ?Charges:   PT Evaluation ?$PT Eval Moderate Complexity: 1 Mod ?PT Treatments ?$Therapeutic Activity: 8-22 mins ?  ?   ? ? ?Blondell Reveal Kistler PT 10/28/2021  ?Acute Rehabilitation Services ?Pager [phone] ?Office [phone] ? ? ?

## 2021-10-28 NOTE — Evaluation (Signed)
Occupational Therapy Evaluation ?Patient Details ?Name: Brenda Alexander ?MRN: [id number] ?DOB: May 08, 1933 ?Today's Date: 10/28/2021 ? ? ?History of Present Illness 88 y.o. female with medical history significant of DM2, HTN, chronic HFpEF. Presenting after a fall. Dx of rhabdomyolisis. Pt had ED visit 10/05/21 for a fall.  ? ?Clinical Impression ?  ?Patient is a 88 year old female who was noted to have had a functional decline. Patient was living at home alone with caregiver support about 3 hours each day. Patient was in A for maintaining sitting balance on edge of bed with posterior leaning noted. Patient was noted to be max A for LB bathing/dressing as well. Patient was noted to have increased pain, decreased functional activity tolerance,  decreased sitting balance, decreased endurance, decreased safety awareness impacting participation in ADLs. Patient would continue to benefit from skilled OT services at this time while admitted and after d/c to address noted deficits in order to improve overall safety and independence in ADLs.  ? ?   ? ?Recommendations for follow up therapy are one component of a multi-disciplinary discharge planning process, led by the attending physician.  Recommendations may be updated based on patient status, additional functional criteria and insurance authorization.  ? ?Follow Up Recommendations ? Skilled nursing-short term rehab (<3 hours/day)  ?  ?Assistance Recommended at Discharge Frequent or constant Supervision/Assistance  ?Patient can return home with the following Two people to help with walking and/or transfers;A lot of help with bathing/dressing/bathroom;Assistance with cooking/housework;Direct supervision/assist for medications management;Assist for transportation;Help with stairs or ramp for entrance;Direct supervision/assist for financial management ? ?  ?Functional Status Assessment ? Patient has had a recent decline in their functional status and demonstrates the ability to make  significant improvements in function in a reasonable and predictable amount of time.  ?Equipment Recommendations ? Other (comment) (defer to next venue)  ?  ?Recommendations for Other Services   ? ? ?  ?Precautions / Restrictions Precautions ?Precautions: Fall ?Precaution Comments: shakiness in LUE ?Restrictions ?Weight Bearing Restrictions: No  ? ?  ? ?Mobility Bed Mobility ?Overal bed mobility: Needs Assistance ?Bed Mobility: Supine to Sit, Sit to Supine ?  ?  ?Supine to sit: Mod assist, HOB elevated ?Sit to supine: Mod assist ?  ?General bed mobility comments: with physical assistance for trunk with increased time and cues to square up on edge of bed. physical assist to get BLE back into bed. ?  ? ?Transfers ?  ?  ?  ?  ?  ?  ?  ?  ?  ?  ?  ? ?  ?Balance Overall balance assessment: Needs assistance ?Sitting-balance support: Feet supported, No upper extremity supported ?Sitting balance-Leahy Scale: Poor ?Sitting balance - Comments: posterior leaning with fatigue ?  ?  ?  ?  ?  ?  ?  ?  ?  ?  ?  ?  ?  ?  ?  ?   ? ?ADL either performed or assessed with clinical judgement  ? ?ADL Overall ADL's : Needs assistance/impaired ?Eating/Feeding: Set up;Sitting ?Eating/Feeding Details (indicate cue type and reason): with noted shakiness with RUE when picking up cup. patient reported it is inconsistently there. ?Grooming: Dance movement psychotherapist;Wash/dry hands;Sitting;Set up ?Grooming Details (indicate cue type and reason): EOB ?Upper Body Bathing: Minimal assistance;Sitting ?Upper Body Bathing Details (indicate cue type and reason): EOB ?Lower Body Bathing: Maximal assistance;Sitting/lateral leans;Bed level ?  ?Upper Body Dressing : Minimal assistance;Sitting ?Upper Body Dressing Details (indicate cue type and reason): EOB ?Lower Body Dressing: Bed level;Maximal  assistance ?  ?Toilet Transfer: +2 for physical assistance;+2 for safety/equipment ?Toilet Transfer Details (indicate cue type and reason): patient had difficult time with  maintaining trunk control on edge of bed with reported pain in L hip. standing deferred on this date. ?Toileting- Clothing Manipulation and Hygiene: Maximal assistance;Bed level ?  ?  ?  ?Functional mobility during ADLs: +2 for physical assistance;+2 for safety/equipment ?   ? ? ? ?Vision Baseline Vision/History: 1 Wears glasses ?Patient Visual Report: No change from baseline ?   ?   ?Perception   ?  ?Praxis   ?  ? ?Pertinent Vitals/Pain Pain Assessment ?Pain Assessment: No/denies pain  ? ? ? ?Hand Dominance Right ?  ?Extremity/Trunk Assessment Upper Extremity Assessment ?Upper Extremity Assessment: RUE deficits/detail;LUE deficits/detail ?RUE Deficits / Details: AAROM to about 90 degrees abduction. AROM shoulder ROM to about 45 degrees abduction, about 30 degrees with flexion. elbow and hand WFL. patient reported no h/o injury. ?LUE Deficits / Details: patient was able to AROM shoulder to about 45 degrees. ?  ?Lower Extremity Assessment ?Lower Extremity Assessment: Defer to PT evaluation ?  ?Cervical / Trunk Assessment ?Cervical / Trunk Assessment: Other exceptions (scoliosis) ?  ?Communication Communication ?Communication: No difficulties ?  ?Cognition Arousal/Alertness: Awake/alert ?Behavior During Therapy: [facility] for tasks assessed/performed ?Overall Cognitive Status: Within Functional Limits for tasks assessed ?  ?  ?  ?  ?  ?  ?  ?  ?  ?  ?  ?  ?  ?  ?  ?  ?General Comments: patient was noted to be motivated to participate in session. noted to have some confusion during session but able to talk through it. ?  ?  ?General Comments    ? ?  ?Exercises   ?  ?Shoulder Instructions    ? ? ?Home Living Family/patient expects to be discharged to:: Private residence ?Living Arrangements: Alone ?Available Help at Discharge: Personal care attendant (from 12-2 daily) ?Type of Home: Apartment ?Home Access: Level entry ?  ?  ?Home Layout: One level ?  ?  ?Bathroom Shower/Tub: Tub/shower unit ?  ?Bathroom Toilet: Standard ?  ?   ?Home Equipment: Rollator (4 wheels);Cane - single point;Shower seat ?  ?Additional Comments: aide 3 hours/day 5x/week ?  ? ?  ?Prior Functioning/Environment Prior Level of Function : Independent/Modified Independent ?  ?  ?  ?  ?  ?  ?  ?ADLs Comments: family helps with transportation. aide helps daily. meals on wheels (family also helps with that). reported that aid helps with showering but patient is able to complete herself. ?  ? ?  ?  ?OT Problem List: Decreased activity tolerance;Decreased coordination;Cardiopulmonary status limiting activity;Decreased knowledge of precautions;Decreased safety awareness;Impaired balance (sitting and/or standing) ?  ?   ?OT Treatment/Interventions: Self-care/ADL training;DME and/or AE instruction;Therapeutic activities;Balance training;Energy conservation;Patient/family education  ?  ?OT Goals(Current goals can be found in the care plan section) Acute Rehab OT Goals ?Patient Stated Goal: to get back home ?OT Goal Formulation: With patient ?Time For Goal Achievement: 11/11/21 ?Potential to Achieve Goals: Fair  ?OT Frequency: Min 2X/week ?  ? ?Co-evaluation   ?  ?  ?  ?  ? ?  ?AM-PAC OT "6 Clicks" Daily Activity     ?Outcome Measure Help from another person eating meals?: A Little ?Help from another person taking care of personal grooming?: A Little ?Help from another person toileting, which includes using toliet, bedpan, or urinal?: Total ?Help from another person bathing (including washing, rinsing, drying)?:  A Lot ?Help from another person to put on and taking off regular upper body clothing?: A Little ?Help from another person to put on and taking off regular lower body clothing?: A Lot ?6 Click Score: 14 ?  ?End of Session Nurse Communication: Mobility status ? ?Activity Tolerance: Patient tolerated treatment well ?Patient left: in bed;with call bell/phone within reach;with bed alarm set ? ?OT Visit Diagnosis: Unsteadiness on feet (R26.81);Muscle weakness (generalized)  (M62.81);History of falling (Z91.81);Other abnormalities of gait and mobility (R26.89)  ?              ?Time: 5391-2258 ?OT Time Calculation (min): 18 min ?Charges:  OT General Charges ?$OT Visit: 1 Visit ?OT E

## 2021-10-28 NOTE — Care Management Obs Status (Signed)
MEDICARE OBSERVATION STATUS NOTIFICATION ? ? ?Patient Details  ?Name: Brenda Alexander ?MRN: [id number] ?Date of Birth: [birth year] ? ? ?Medicare Observation Status Notification Given:  Yes ? ? ? ?Dessa Phi, RN ?10/28/2021, 9:47 AM ?

## 2021-10-28 NOTE — Progress Notes (Addendum)
?PROGRESS NOTE ?Brenda Alexander  GUY:[id number] DOB: [birth year] DOA: 10/27/2021 ?PCP: [name], NP  ? ?Brief Narrative/Hospital Course: ?48 yof w/ DM2,HTN,chronic HFpEF presented after a fall.She apparently was able to relay her story to the ED- she was trying to transfer from a recliner night PTA and apparently slid to the floor. She was unable to get up on her own. She didn't hit her head. She tried to call her daughter, but her daughter was unable to pick up the phone. Family checked on her in morning and saw that she had been on the floor all night. ?In the ED, labs w: Na+ 132 CK 844 Trp 37 -> 36 WBC  5.2 ?EKG: sinus, no st elevations; prolong QT. CTH/CT cspine: "1. No acute intracranial abnormality. 2. No acute fracture or subluxation of the cervical spine. 3. Advanced facet arthropathy on the left at C2-3 with slight widening of the joint space and erosions suggesting an underlying inflammatory or crystalline arthropathy.4. Moderate chronic microvascular ischemic change and diffuse cerebral volume loss." ?CXR:  1. Low lung volumes with mildly increased bibasilar interstitial markings which could be due to atelectasis or early infiltrate. 2. Mild cardiomegaly.  No evidence of UTI. ?Patient admitted for rhabdomyolysis mild hyponatremia and want this to fall/possible near syncope ?Echocardiogram ordered PT OT requested ? ?Subjective: ?Seen and examined this morning.  Daughter at the bedside.  Patient on the bedside she had.  Still appearing weak complains of constipation, last BM was on Friday.  Passing flatus. ?Dr. Jimmye Norman to consider placement reluctant to have a return home has had 2 falls recently ? ?Assessment and Plan: ?Active Problems: ?  Essential hypertension ?  Type 2 diabetes mellitus ([place]) ?  Chronic heart failure with preserved ejection fraction ([place]) ?  Multiple falls ?  Near syncope ?  HLD (hyperlipidemia) ?  Rhabdomyolysis ? ?Rhabdomyolysis ?Mild hyponatremia: ?Suspect component of dehydration and  traumatic mild rhabdomyolysis we will continue on gentle IV fluid hydration.  Encourage oral intake.  Sodium improving. ? ?Fall at home questionable near syncope: Continue to monitor in telemetry.  CT head negative for acute finding.  Echocardiogram pending.  Check orthostatic vitals-BP 166 on sitting and standing 185. keep on gentle IV fluids monitor in telemetry. ? ?Nonsustained V. tach 6 beats this morning, asymptomatic.  Check mag and potassium and keep mag above 2 and potassium at about 4.  Monitor on telemetry. ? ?Subtle/mildly elevated troponin likely demand ischemia troponin 37> 36, EKG nonischemic.  Follow-up echocardiogram. ?T2DM diet controlled monitor CBG and SSI ?Recent Labs  ?Lab 10/27/21 ?1200 10/28/21 ?0500  ?GLUCAP 96 94  ? ?Chronic HFpEF: Compensated slightly on dry side, needing IV fluid hydration, follow-up echocardiogram. ?Hyperlipidemia on statin-tolerating CK is downtrending, so will keep on and monitor. ?Hypertension BP fairly controlled on Imdur.  Aldactone clonidine ?Deconditioning debility continue PT OT and placement. ? ?DVT prophylaxis: enoxaparin (LOVENOX) injection 40 mg Start: 10/27/21 2200 ?Code Status:   Code Status: Full Code ?Family Communication: plan of care discussed with patient/daughter at bedside. ?Patient status is: Observation but will need at least 2 midnight stay for ongoing IV diuresis, unsafe disposition need for skilled nursing facility.  Level of care: Telemetry  ?Patient currently not stable ? ?Dispo: The patient is from: home ?           Anticipated disposition: SNF ? ?Mobility Assessment (last 72 hours)   ? ? Mobility Assessment   ? ? [place] Name 10/28/21 1028 10/28/21 0841 10/27/21 2043 10/27/21 1658  ?  ?  Does patient have an order for bedrest or is patient medically unstable -- No - Continue assessment No - Continue assessment No - Continue assessment   ? What is the highest level of mobility based on the progressive mobility assessment? Level 3 (Stands with  assist) - Balance while standing  and cannot march in place Level 3 (Stands with assist) - Balance while standing  and cannot march in place -- Level 3 (Stands with assist) - Balance while standing  and cannot march in place   ? ?  ?  ? ?  ?  ? ?Objective: ?Vitals last 24 hrs: ?Vitals:  ? 10/28/21 0500 10/28/21 0503 10/28/21 0943 10/28/21 1251  ?BP:  (!) 149/69 (!) 167/69 116/69  ?Pulse:  75 98 80  ?Resp:  18 20 (!) 22  ?Temp:  99.1 ?F (37.3 ?C) 99.4 ?F (37.4 ?C) 98.5 ?F (36.9 ?C)  ?TempSrc:  Oral Oral Oral  ?SpO2:  96% 99% 96%  ?Weight: 67.5 kg     ?Height:      ? ?Weight change:  ? ?Physical Examination: ?General exam: Aa0x3, weak,older than stated age, weak appearing. ?HEENT:Oral mucosa moist, Ear/Nose WNL grossly, dentition normal. ?Respiratory system: bilaterally diminished BS, no use of accessory muscle ?Cardiovascular system: S1 & S2 +, No JVD,. ?Gastrointestinal system: Abdomen soft,NT,ND, BS+ ?Nervous System:Alert, awake, moving extremities and grossly nonfocal ?Extremities: LE edema NONE,distal peripheral pulses palpable.  ?Skin: No rashes,no icterus. ?MSK: Normal muscle bulk,tone, power ? ?Medications reviewed:  ?Scheduled Meds: ? cloNIDine  0.2 mg Oral BID  ? enoxaparin (LOVENOX) injection  40 mg Subcutaneous Q24H  ? isosorbide mononitrate  20 mg Oral Daily  ? pantoprazole  40 mg Oral Daily  ? simvastatin  20 mg Oral QHS  ? sodium chloride flush  3 mL Intravenous Q12H  ? spironolactone  12.5 mg Oral Daily  ? ?Continuous Infusions: ? sodium chloride    ? ? ?  ?Diet Order   ? ?       ?  Diet Carb Modified Fluid consistency: Thin; Room service appropriate? Yes  Diet effective now       ?  ? ?  ?  ? ?  ?  ? ?  ?  ?  ? ? ?Intake/Output Summary (Last 24 hours) at 10/28/2021 1345 ?Last data filed at 10/28/2021 0900 ?Gross per 24 hour  ?Intake 897.58 ml  ?Output 650 ml  ?Net 247.58 ml  ? ?Net IO Since Admission: 247.58 mL [10/28/21 1345]  ?Wt Readings from Last 3 Encounters:  ?10/28/21 67.5 kg  ?10/16/21 75.3 kg   ?05/24/21 70.8 kg  ?  ? ?Unresulted Labs (From admission, onward)  ? ? None  ? ?  ?Data Reviewed: I have personally reviewed following labs and imaging studies ?CBC: ?Recent Labs  ?Lab 11/18/21 ?1202 10/28/21 ?0403  ?WBC 5.2 4.8  ?NEUTROABS 3.9  --   ?HGB 12.7 10.9*  ?HCT 38.9 33.9*  ?MCV 85.7 86.5  ?PLT 232 231  ? ?Basic Metabolic Panel: ?Recent Labs  ?Lab November 18, 2021 ?1202 10/28/21 ?0403  ?NA 132* 134*  ?K 3.5 3.5  ?CL 96* 98  ?CO2 27 28  ?GLUCOSE 97 90  ?BUN 21 26*  ?CREATININE 0.89 1.05*  ?CALCIUM 9.7 9.2  ? ?GFR: ?Estimated Creatinine Clearance: 34.7 mL/min (A) (by C-G formula based on SCr of 1.05 mg/dL (H)). ?Liver Function Tests: ?Recent Labs  ?Lab 2021/11/18 ?1202 10/28/21 ?0403  ?AST 41 31  ?ALT 15 14  ?ALKPHOS 74 66  ?BILITOT 0.8 0.7  ?  PROT 7.2 6.0*  ?ALBUMIN 4.1 3.4*  ? ?No results for input(s): LIPASE, AMYLASE in the last 168 hours. ?No results for input(s): AMMONIA in the last 168 hours. ?Coagulation Profile: ?No results for input(s): INR, PROTIME in the last 168 hours. ?BNP (last 3 results) ?No results for input(s): PROBNP in the last 8760 hours. ?HbA1C: ?No results for input(s): HGBA1C in the last 72 hours. ?CBG: ?Recent Labs  ?Lab 10/27/21 ?1200 10/28/21 ?0500  ?GLUCAP 96 94  ? ?Lipid Profile: ?No results for input(s): CHOL, HDL, LDLCALC, TRIG, CHOLHDL, LDLDIRECT in the last 72 hours. ?Thyroid Function Tests: ?No results for input(s): TSH, T4TOTAL, FREET4, T3FREE, THYROIDAB in the last 72 hours. ?Sepsis Labs: ?No results for input(s): PROCALCITON, LATICACIDVEN in the last 168 hours. ? ?No results found for this or any previous visit (from the past 240 hour(s)).  ?Antimicrobials: ?Anti-infectives (From admission, onward)  ? ? None  ? ?  ? ?Culture/Microbiology ? ?Other culture-see note  ?Radiology Studies: ?DG Pelvis 1-2 Views ? ?Result Date: 10/27/2021 ?CLINICAL DATA:  Fall EXAM: PELVIS - 1-2 VIEW COMPARISON:  No prior pelvic radiographs, correlation is made with 08/25/2018 CT abdomen pelvis. FINDINGS: No  definite acute fracture on this single view of the pelvis. Degenerative changes in the bilateral hips, sacroiliac joints and at the pubic symphysis. IMPRESSION: No definite acute fracture on this single view

## 2021-10-28 NOTE — Progress Notes (Signed)
Patient had 6 runs of V-tach, vitals WNL, patient denies any pain or distress, in bed resting. Dr. Lupita Leash notified, order given, will continue to assess patient.  ?

## 2021-10-28 NOTE — TOC Initial Note (Signed)
Transition of Care (TOC) - Initial/Assessment Note  ? ? ?Patient Details  ?Name: Brenda Alexander ?MRN: [id number] ?Date of Birth: [birth year] ? ?Transition of Care [facility]) CM/SW Contact:    ?Dessa Phi, RN ?Phone Number: ?10/28/2021, 9:45 AM ? ?Clinical Narrative:  Await PT recc.               ? ? ?  ?Barriers to Discharge: Continued Medical Work up ? ? ?Patient Goals and CMS Choice ?Patient states their goals for this hospitalization and ongoing recovery are:: rehab ?CMS Medicare.gov Compare Post Acute Care list provided to:: Patient Represenative (must comment) (dtr nanette [id number]) ?Choice offered to / list presented to : Adult Children ? ?Expected Discharge Plan and Services ?  ?  ?Discharge Planning Services: CM Consult ?Post Acute Care Choice: [place] ?Living arrangements for the past 2 months: [place] ?                ?  ?  ?  ?  ?  ?  ?  ?  ?  ?  ? ?Prior Living Arrangements/Services ?Living arrangements for the past 2 months: [place] ?Lives with:: Adult Children ?Patient language and need for interpreter reviewed:: Yes ?Do you feel safe going back to the place where you live?: Yes      ?Need for Family Participation in Patient Care: Yes (Comment) ?Care giver support system in place?: Yes (comment) ?Current home services: DME (rw,rollator,3n1,shower chair) ?Criminal Activity/Legal Involvement Pertinent to Current Situation/Hospitalization: No - Comment as needed ? ?Activities of Daily Living ?  ?ADL Screening (condition at time of admission) ?Is the patient deaf or have difficulty hearing?: No ?Does the patient have difficulty seeing, even when wearing glasses/contacts?: No ?Does the patient have difficulty concentrating, remembering, or making decisions?: No ?Does the patient have difficulty dressing or bathing?: Yes ?Does the patient have difficulty walking or climbing stairs?: Yes ? ?Permission Sought/Granted ?Permission sought to share information with : Case  Manager ?Permission granted to share information with : Yes, Verbal Permission Granted ? Share Information with NAME: Case manager ?   ?   ?   ? ?Emotional Assessment ?Appearance:: Appears stated age ?Attitude/Demeanor/Rapport: Gracious ?Affect (typically observed): Accepting ?Orientation: : Oriented to Self ?Alcohol / Substance Use: Not Applicable ?Psych Involvement: No (comment) ? ?Admission diagnosis:  Syncope [R55] ?Fall, subsequent encounter [W19.XXXD] ?Altered mental status, unspecified altered mental status type [R41.82] ?Patient Active Problem List  ? Diagnosis Date Noted  ? Near syncope 10/27/2021  ? HLD (hyperlipidemia) 10/27/2021  ? Rhabdomyolysis 10/27/2021  ? AKI (acute kidney injury) ([place]) 08/07/2020  ? Chronic heart failure with preserved ejection fraction ([place]) 10/27/2018  ? Multiple falls 10/27/2018  ? SBO (small bowel obstruction) ([place]) 07/22/2018  ? Leukocytosis 07/22/2018  ? Hypokalemia 07/22/2018  ? Type 2 diabetes mellitus ([place]) 07/22/2018  ? 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 Physical deconditioning 07/22/2018  ? 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 PMB (postmenopausal bleeding) 04/09/2018  ? Bladder mass   ? Anemia 01/11/2018  ? Essential hypertension 01/11/2018  ? Coronary artery disease 01/11/2018  ? ?PCP:  [name], NP ?Pharmacy:   ?[facility] [address] ?[place] [address] ?Phone: [phone] Fax: [phone] ? ?[place], [address] ?[address] ?[place] [address] ?Phone: [phone] Fax: [phone] ? ? ? ? ?  Social Determinants of Health (SDOH) Interventions ?  ? ?Readmission Risk Interventions ?   ? View : No data to display.  ?  ?  ?  ? ? ? ?

## 2021-10-28 NOTE — Hospital Course (Addendum)
52 yof w/ DM2,HTN,chronic HFpEF presented after a fall.She apparently was able to relay her story to the ED- she was trying to transfer from a recliner night PTA and apparently slid to the floor. She was unable to get up on her own. She didn't hit her head. She tried to call her daughter, but her daughter was unable to pick up the phone. Family checked on her in morning and saw that she had been on the floor all night. ?In the ED, labs w: Na+ 132 CK 844 Trp 37 -> 36 WBC  5.2 ?EKG: sinus, no st elevations; prolong QT. CTH/CT cspine: "1. No acute intracranial abnormality. 2. No acute fracture or subluxation of the cervical spine. 3. Advanced facet arthropathy on the left at C2-3 with slight widening of the joint space and erosions suggesting an underlying inflammatory or crystalline arthropathy.4. Moderate chronic microvascular ischemic change and diffuse cerebral volume loss." ?CXR:  1. Low lung volumes with mildly increased bibasilar interstitial markings which could be due to atelectasis or early infiltrate. 2. Mild cardiomegaly.  No evidence of UTI. ?Patient admitted for rhabdomyolysis mild hyponatremia and want this to fall/possible near syncope.  Patient orthostatic vitals were negative.  Echocardiogram showed EF more than 75%, mild concentric LVH with severe proximal septal thickening, no RWMA, normal RV systolic function, left atrium severely dilated aortic valve is tricuspid mitral valve is normal. Patient was found to be deconditioned weak PT OT evaluation recommended skilled nursing facility placement. 4/5-patient complained of left groin pain nausea emesis and found to have left groin mass and imaging showed incarcerated left inguinal hernia, general surgery manually reduced it. General surgery took the patient to the OR, was not reducible, then underwent hernia repair surgery 4/6.Postop doing well cleared by surgery for discharge to skilled nursing facility.  Waiting for insurance approval ?

## 2021-10-29 DIAGNOSIS — T796XXA Traumatic ischemia of muscle, initial encounter: Secondary | ICD-10-CM | POA: Diagnosis not present

## 2021-10-29 DIAGNOSIS — K4030 Unilateral inguinal hernia, with obstruction, without gangrene, not specified as recurrent: Secondary | ICD-10-CM | POA: Diagnosis not present

## 2021-10-29 LAB — BASIC METABOLIC PANEL
Anion gap: 6 (ref 5–15)
BUN: 26 mg/dL — ABNORMAL HIGH (ref 8–23)
CO2: 27 mmol/L (ref 22–32)
Calcium: 9.2 mg/dL (ref 8.9–10.3)
Chloride: 102 mmol/L (ref 98–111)
Creatinine, Ser: 0.91 mg/dL (ref 0.44–1.00)
GFR, Estimated: >60 mL/min (ref >60)
Glucose, Bld: 87 mg/dL (ref 70–99)
Potassium: 4.1 mmol/L (ref 3.5–5.1)
Sodium: 135 mmol/L (ref 135–145)

## 2021-10-29 LAB — CK: Total CK: 374 U/L — ABNORMAL HIGH (ref 38–234)

## 2021-10-29 LAB — MAGNESIUM: Magnesium: 2.2 mg/dL (ref 1.7–2.4)

## 2021-10-29 NOTE — Progress Notes (Signed)
?PROGRESS NOTE ?Brenda Alexander  WUJ:[id number] DOB: [birth year] DOA: 10/27/2021 ?PCP: [name], NP  ? ?Brief Narrative/Hospital Course: ?68 yof w/ DM2,HTN,chronic HFpEF presented after a fall.She apparently was able to relay her story to the ED- she was trying to transfer from a recliner night PTA and apparently slid to the floor. She was unable to get up on her own. She didn't hit her head. She tried to call her daughter, but her daughter was unable to pick up the phone. Family checked on her in morning and saw that she had been on the floor all night. ?In the ED, labs w: Na+ 132 CK 844 Trp 37 -> 36 WBC  5.2 ?EKG: sinus, no st elevations; prolong QT. CTH/CT cspine: "1. No acute intracranial abnormality. 2. No acute fracture or subluxation of the cervical spine. 3. Advanced facet arthropathy on the left at C2-3 with slight widening of the joint space and erosions suggesting an underlying inflammatory or crystalline arthropathy.4. Moderate chronic microvascular ischemic change and diffuse cerebral volume loss." ?CXR:  1. Low lung volumes with mildly increased bibasilar interstitial markings which could be due to atelectasis or early infiltrate. 2. Mild cardiomegaly.  No evidence of UTI. ?Patient admitted for rhabdomyolysis mild hyponatremia and want this to fall/possible near syncope.  Patient orthostatic vitals were negative.  Echocardiogram showed EF more than 75%, mild concentric LVH with severe proximal septal thickening, no RWMA, normal RV systolic function, left atrium severely dilated aortic valve is tricuspid mitral valve is normal ?Patient was found to be deconditioned weak PT OT evaluation recommended skilled nursing facility placement.  At this point patient is medically stable ?Subjective: ?Seen this morning.  She reports she feels much improved.  No nausea vomiting chest pain fever chills.   ? ?Assessment and Plan: ?Active Problems: ?  Essential hypertension ?  Type 2 diabetes mellitus ([place]) ?  Chronic  heart failure with preserved ejection fraction ([place]) ?  Multiple falls ?  Near syncope ?  HLD (hyperlipidemia) ?  Rhabdomyolysis ? ?Rhabdomyolysis ?Mild hyponatremia: ?Suspect component of dehydration and traumatic mild rhabdomyolysis -CK much improved discontinue IV fluids encourage oral intake.   ? ?Fall at home questionable near syncope: CT head negative for acute finding.  Echocardiogram EF more than 75%, no RWMA, normal mitral valve tricuspid AV.negative orthostatic vitals ? ?Nonsustained V. tach : Monitoring telemetry.  Electrolytes stable mag phos ? ?Subtle/mildly elevated troponin likely demand ischemia troponin 37> 36, EKG nonischemic.  Follow-up echocardiogram unremarkable. ? ?T2DM diet controlled monitor CBG and SSI ?Recent Labs  ?Lab 10/27/21 ?1200 10/28/21 ?0500  ?GLUCAP 96 94  ? ?Chronic HFpEF: Compensated .  EF is preserved.  ?Hyperlipidemia on statin-tolerating CK is downtrending, so will keep on and monitor. ?Hypertension BP fairly controlled on Imdur.  Aldactone clonidine ?Deconditioning debility continue PT OT and placement. ? ?DVT prophylaxis: enoxaparin (LOVENOX) injection 40 mg Start: 10/27/21 2200 ?Code Status:   Code Status: Full Code ?Family Communication: plan of care discussed with patient/daughter at bedside. ?Patient status is: Observation but will need at least 2 midnight stay for ongoing IV diuresis, unsafe disposition need for skilled nursing facility.  Level of care: Telemetry  ?Patient currently Stable ? ?Dispo: The patient is from: home ?           Anticipated disposition: SNF once bed available.  Medically stable for discharge ? ?Mobility Assessment (last 72 hours)   ? ? Mobility Assessment   ? ? [place] Name 10/28/21 2300 10/28/21 1551 10/28/21 1028 10/28/21 0841 10/27/21 2043  ?  Does patient have an order for bedrest or is patient medically unstable No - Continue assessment -- -- No - Continue assessment No - Continue assessment  ? What is the highest level of mobility based on the  progressive mobility assessment? Level 3 (Stands with assist) - Balance while standing  and cannot march in place Level 3 (Stands with assist) - Balance while standing  and cannot march in place Level 3 (Stands with assist) - Balance while standing  and cannot march in place Level 3 (Stands with assist) - Balance while standing  and cannot march in place --  ? ? [place] Name November 22, 2021 1658  ?  ?  ?  ?  ? Does patient have an order for bedrest or is patient medically unstable No - Continue assessment      ? What is the highest level of mobility based on the progressive mobility assessment? Level 3 (Stands with assist) - Balance while standing  and cannot march in place      ? ?  ?  ? ?  ?  ? ?Objective: ?Vitals last 24 hrs: ?Vitals:  ? 10/28/21 1809 10/28/21 2159 10/29/21 0433 10/29/21 0500  ?BP: (!) 155/83 (!) 172/77 (!) 172/83   ?Pulse: 86 75 75   ?Resp: '19 20 20   '$ ?Temp: 99.1 ?F (37.3 ?C) 99.1 ?F (37.3 ?C) 98.3 ?F (36.8 ?C)   ?TempSrc: Oral Oral Oral   ?SpO2: 100% 98% 99%   ?Weight:    70.9 kg  ?Height:      ? ?Weight change: 1.4 kg ? ?Physical Examination: ?General exam: AA oriented, pleasant,older than stated age, weak appearing. ?HEENT:Oral mucosa moist, Ear/Nose WNL grossly, dentition normal. ?Respiratory system: bilaterally diminished,no use of accessory muscle ?Cardiovascular system: S1 & S2 +, No JVD,. ?Gastrointestinal system: Abdomen soft,NT,ND, BS+ ?Nervous System:Alert, awake, moving extremities and grossly nonfocal ?Extremities: edema neg,distal peripheral pulses palpable.  ?Skin: No rashes,no icterus. ?MSK: Normal muscle bulk,tone, power ? ? ?Medications reviewed:  ?Scheduled Meds: ? cloNIDine  0.2 mg Oral BID  ? enoxaparin (LOVENOX) injection  40 mg Subcutaneous Q24H  ? isosorbide mononitrate  20 mg Oral Daily  ? pantoprazole  40 mg Oral Daily  ? simvastatin  20 mg Oral QHS  ? sodium chloride flush  3 mL Intravenous Q12H  ? spironolactone  12.5 mg Oral Daily  ? ?Continuous Infusions: ? ? ? ?  ?Diet Order    ? ?       ?  Diet Carb Modified Fluid consistency: Thin; Room service appropriate? Yes  Diet effective now       ?  ? ?  ?  ? ?  ?  ? ?  ?  ?  ? ? ?Intake/Output Summary (Last 24 hours) at 10/29/2021 1241 ?Last data filed at 10/29/2021 1131 ?Gross per 24 hour  ?Intake 803.52 ml  ?Output 700 ml  ?Net 103.52 ml  ? ?Net IO Since Admission: 151.1 mL [10/29/21 1241]  ?Wt Readings from Last 3 Encounters:  ?10/29/21 70.9 kg  ?10/16/21 75.3 kg  ?05/24/21 70.8 kg  ?  ? ?Unresulted Labs (From admission, onward)  ? ? None  ? ?  ?Data Reviewed: I have personally reviewed following labs and imaging studies ?CBC: ?Recent Labs  ?Lab 2021-11-22 ?1202 10/28/21 ?0403  ?WBC 5.2 4.8  ?NEUTROABS 3.9  --   ?HGB 12.7 10.9*  ?HCT 38.9 33.9*  ?MCV 85.7 86.5  ?PLT 232 231  ? ?Basic Metabolic Panel: ?Recent Labs  ?Lab 11-22-21 ?1202  10/28/21 ?0403 10/29/21 ?0424  ?NA 132* 134* 135  ?K 3.5 3.5 4.1  ?CL 96* 98 102  ?CO2 '27 28 27  '$ ?GLUCOSE 97 90 87  ?BUN 21 26* 26*  ?CREATININE 0.89 1.05* 0.91  ?CALCIUM 9.7 9.2 9.2  ?MG  --  2.4 2.2  ? ?GFR: ?Estimated Creatinine Clearance: 40.9 mL/min (by C-G formula based on SCr of 0.91 mg/dL). ?Liver Function Tests: ?Recent Labs  ?Lab 10/27/21 ?1202 10/28/21 ?0403  ?AST 41 31  ?ALT 15 14  ?ALKPHOS 74 66  ?BILITOT 0.8 0.7  ?PROT 7.2 6.0*  ?ALBUMIN 4.1 3.4*  ? ?No results for input(s): LIPASE, AMYLASE in the last 168 hours. ?No results for input(s): AMMONIA in the last 168 hours. ?Coagulation Profile: ?No results for input(s): INR, PROTIME in the last 168 hours. ?BNP (last 3 results) ?No results for input(s): PROBNP in the last 8760 hours. ?HbA1C: ?No results for input(s): HGBA1C in the last 72 hours. ?CBG: ?Recent Labs  ?Lab 10/27/21 ?1200 10/28/21 ?0500  ?GLUCAP 96 94  ? ?Lipid Profile: ?No results for input(s): CHOL, HDL, LDLCALC, TRIG, CHOLHDL, LDLDIRECT in the last 72 hours. ?Thyroid Function Tests: ?No results for input(s): TSH, T4TOTAL, FREET4, T3FREE, THYROIDAB in the last 72 hours. ?Sepsis Labs: ?No results  for input(s): PROCALCITON, LATICACIDVEN in the last 168 hours. ? ?No results found for this or any previous visit (from the past 240 hour(s)).  ?Antimicrobials: ?Anti-infectives (From admission, onward

## 2021-10-29 NOTE — TOC Progression Note (Signed)
Transition of Care (TOC) - Progression Note  ? ? ?Patient Details  ?Name: Keyonna [place]unale ?MRN: [id number] ?Date of Birth: 07-17-1933 ? ?Transition of Care (TOC) CM/SW Contact  ?Dessa Phi, RN ?Phone Number: ?10/29/2021, 10:09 AM ? ?Clinical Narrative:  Await bed offers.  ? ? ? ?Expected Discharge Plan: [place] ?Barriers to Discharge: Continued Medical Work up ? ?Expected Discharge Plan and Services ?Expected Discharge Plan: [place] ?  ?Discharge Planning Services: CM Consult ?Post Acute Care Choice: [place] ?Living arrangements for the past 2 months: [place] ?                ?  ?  ?  ?  ?  ?  ?  ?  ?  ?  ? ? ?Social Determinants of Health (SDOH) Interventions ?  ? ?Readmission Risk Interventions ?   ? View : No data to display.  ?  ?  ?  ? ? ?

## 2021-10-29 NOTE — [place] FL2 (Signed)
?[place] MEDICAID FL2 LEVEL OF CARE SCREENING TOOL  ?  ? ?IDENTIFICATION  ?Patient Name: ?Brenda Alexander Birthdate: 23-Aug-1932 Sex: female Admission Date (Current Location): ?10/27/2021  ?[place] and [place] Number: ? [place] ?  Facility and Address:  ?[facility],  [address] [place], [place] ?     Provider Number: ?[id number]  ?Attending Physician Name and Address:  ?Antonieta Pert, MD ? Relative Name and Phone Number:  ?Clovis Warwick dtr [phone] ?   ?Current Level of Care: ?Hospital Recommended Level of Care: ?[place] Prior Approval Number: ?  ? ?Date Approved/Denied: ?  PASRR Number: ?[id number] ? ?Discharge Plan: ?SNF ?  ? ?Current Diagnoses: ?Patient Active Problem List  ? Diagnosis Date Noted  ? Near syncope 10/27/2021  ? HLD (hyperlipidemia) 10/27/2021  ? Rhabdomyolysis 10/27/2021  ? AKI (acute kidney injury) ([place]) 08/07/2020  ? Chronic heart failure with preserved ejection fraction ([place]) 10/27/2018  ? Multiple falls 10/27/2018  ? SBO (small bowel obstruction) ([place]) 07/22/2018  ? Leukocytosis 07/22/2018  ? Hypokalemia 07/22/2018  ? Type 2 diabetes mellitus ([place]) 07/22/2018  ? 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 Physical deconditioning 07/22/2018  ? 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 PMB (postmenopausal bleeding) 04/09/2018  ? Bladder mass   ? Anemia 01/11/2018  ? Essential hypertension 01/11/2018  ? Coronary artery disease 01/11/2018  ? ? ?Orientation RESPIRATION BLADDER Height & Weight   ?  ?Self, Time, Situation ? Normal Continent Weight: 70.9 kg ?Height:  '5\' 2"'$  (157.5 cm)  ?BEHAVIORAL SYMPTOMS/MOOD NEUROLOGICAL BOWEL NUTRITION STATUS  ?    Continent Diet (CHO MOD)  ?AMBULATORY STATUS COMMUNICATION OF NEEDS Skin   ?Limited Assist Verbally Normal ?  ?  ?  ?    ?     ?     ? ? ?Personal Care Assistance Level of Assistance  ?Bathing, Feeding, Dressing Bathing Assistance: Limited assistance ?Feeding assistance: Limited assistance ?Dressing Assistance:  Limited assistance ?   ? ?Functional Limitations Info  ?Sight, Hearing, Speech Sight Info: Adequate ?Hearing Info: Adequate ?Speech Info: Impaired (Dentures top/bottom)  ? ? ?SPECIAL CARE FACTORS FREQUENCY  ?PT (By licensed PT), OT (By licensed OT)   ?  ?PT Frequency:  (5x week) ?OT Frequency:  (5x week) ?  ?  ?  ?   ? ? ?Contractures Contractures Info: Not present  ? ? ?Additional Factors Info  ?Code Status, Allergies Code Status Info:  (Full) ?Allergies Info:  (Other, Penicillins) ?  ?  ?  ?   ? ?Current Medications (10/29/2021):  This is the current hospital active medication list ?Current Facility-Administered Medications  ?Medication Dose Route Frequency Provider Last Rate Last Admin  ? acetaminophen (TYLENOL) tablet 650 mg  650 mg Oral Q6H PRN Marylyn Ishihara, Tyrone A, DO   650 mg at 10/29/21 0603  ? alum & mag hydroxide-simeth (MAALOX/MYLANTA) 200-200-20 MG/5ML suspension 15 mL  15 mL Oral Q6H PRN Marylyn Ishihara, Tyrone A, DO   15 mL at 10/27/21 1821  ? cloNIDine (CATAPRES) tablet 0.2 mg  0.2 mg Oral BID Marylyn Ishihara, Tyrone A, DO   0.2 mg at 10/29/21 0854  ? enoxaparin (LOVENOX) injection 40 mg  40 mg Subcutaneous Q24H Kyle, Tyrone A, DO   40 mg at 10/28/21 2208  ? hydrALAZINE (APRESOLINE) injection 10 mg  10 mg Intravenous Q8H PRN Marylyn Ishihara, Tyrone A, DO   10 mg at 10/27/21 1542  ? isosorbide mononitrate (ISMO) tablet 20 mg  20 mg Oral Daily Kyle, Tyrone A, DO  20 mg at 10/29/21 0853  ? ondansetron (ZOFRAN) tablet 4 mg  4 mg Oral Q6H PRN Cherylann Ratel A, DO      ? Or  ? ondansetron (ZOFRAN) injection 4 mg  4 mg Intravenous Q6H PRN Marylyn Ishihara, Tyrone A, DO   4 mg at 10/27/21 1841  ? pantoprazole (PROTONIX) EC tablet 40 mg  40 mg Oral Daily Kyle, Tyrone A, DO   40 mg at 10/29/21 0854  ? simvastatin (ZOCOR) tablet 20 mg  20 mg Oral QHS Kyle, Tyrone A, DO   20 mg at 10/28/21 2208  ? sodium chloride flush (NS) 0.9 % injection 3 mL  3 mL Intravenous Q12H Kyle, Tyrone A, DO   3 mL at 10/29/21 0857  ? spironolactone (ALDACTONE) tablet 12.5 mg  12.5 mg  Oral Daily Kyle, Tyrone A, DO   12.5 mg at 10/29/21 5784  ? ? ? ?Discharge Medications: ?Please see discharge summary for a list of discharge medications. ? ?Relevant Imaging Results: ? ?Relevant Lab Results: ? ? ?Additional Information ?[employer] x2;ss#[id number] ? ?Dessa Phi, RN ? ? ? ? ?

## 2021-10-30 ENCOUNTER — Observation Stay (HOSPITAL_COMMUNITY): Payer: Medicare HMO

## 2021-10-30 DIAGNOSIS — I5032 Chronic diastolic (congestive) heart failure: Secondary | ICD-10-CM

## 2021-10-30 DIAGNOSIS — K4030 Unilateral inguinal hernia, with obstruction, without gangrene, not specified as recurrent: Secondary | ICD-10-CM | POA: Diagnosis not present

## 2021-10-30 LAB — URINALYSIS, COMPLETE (UACMP) WITH MICROSCOPIC
Bilirubin Urine: NEGATIVE
Glucose, UA: NEGATIVE mg/dL
Ketones, ur: NEGATIVE mg/dL
Leukocytes,Ua: NEGATIVE
Nitrite: NEGATIVE
Protein, ur: NEGATIVE mg/dL
Specific Gravity, Urine: 1.008 (ref 1.005–1.030)
pH: 8 (ref 5.0–8.0)

## 2021-10-30 MED ORDER — LACTATED RINGERS IV SOLN: INTRAVENOUS | Status: DC

## 2021-10-30 NOTE — Consult Note (Signed)
Reason for Consult:vomiting ?Referring Physician: Dr. Lupita Leash ? ?Brenda Alexander is an 88 y.o. female.  ?HPI: The patient is an 88 year old black female who is in the hospital after multiple falls.  She was getting ready to go to a nursing home when she developed nausea and vomiting earlier today.  She was noted to have a lump in her left groin.  She had a bowel movement earlier today. ? ?Past Medical History:  ?Diagnosis Date  ? Anemia   ? Arthritis   ? knees, hands  ? Coronary artery disease   ? Diabetes mellitus without complication ([place])   ? no meds  ? Dyspnea 01/12/2018  ? w/anemia dx and hospital admit w/blood transfused   ? 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 History of blood transfusion 01/12/2018  ? at [facility]  ? Hyperlipemia   ? Hypertension   ? SVD (spontaneous vaginal delivery)   ? x 6 - 5 living children  ? Uses walker   ? rollator  ? Wears partial dentures   ? upper and lower partials  ? ? ?Past Surgical History:  ?Procedure Laterality Date  ? COLONOSCOPY    ? EYE SURGERY    ? cataracts removed right eye  ? HYSTEROSCOPY WITH D & C N/A 05/06/2018  ? Procedure: DILATATION AND CURETTAGE /HYSTEROSCOPY;  Surgeon: [name], MD;  Location: [facility];  Service: Gynecology;  Laterality: N/A;  ? LAPAROSCOPY N/A 08/26/2018  ? Procedure: LAPAROSCOPY DIAGNOSTIC;  Surgeon: [name], MD;  Location: [facility];  Service: General;  Laterality: N/A;  ? MULTIPLE TOOTH EXTRACTIONS    ? ROBOTIC ASSISTED TOTAL HYSTERECTOMY WITH BILATERAL SALPINGO OOPHERECTOMY Bilateral 05/24/2018  ? Procedure: XI ROBOTIC ASSISTED TOTAL HYSTERECTOMY WITH BILATERAL SALPINGO OOPHORECTOMY;  Surgeon: [name], MD;  Location: [name];  Service: Gynecology;  Laterality: Bilateral;  ? SENTINEL NODE BIOPSY N/A 05/24/2018  ? Procedure: SENTINEL LYMP  NODE BIOPSY;  Surgeon: [name], MD;  Location: [name];  Service: Gynecology;  Laterality: N/A;  ? ? ?Family History  ?Problem Relation Age of Onset  ? Obesity Son   ?  Hypertension Father   ? Stroke Father   ? Hypertension Sister   ? Heart failure Sister   ? Hypertension Brother   ? Hypertension Sister   ? Hypertension Sister   ? ? ?Social History:  reports that she has never smoked. She has never used smokeless tobacco. She reports that she does not currently use alcohol. She reports that she does not use drugs. ? ?Allergies:  ?Allergies  ?Allergen Reactions  ? Other Anaphylaxis and Swelling  ?  Walnuts = Throat swells  ? Penicillins Swelling and Rash  ?  Has patient had a PCN reaction causing immediate rash, facial/tongue/throat swelling, SOB or lightheadedness with hypotension: Yes ?Has patient had a PCN reaction causing severe rash involving mucus membranes or skin necrosis: Unk ?Has patient had a PCN reaction that required hospitalization: Unk ?Has patient had a PCN reaction occurring within the last 10 years: No ?If all of the above answers are "NO", then may proceed with Cephalosporin use. ?  ? ? ?Medications: I have reviewed the patient's current medications. ? ?Results for orders placed or performed during the hospital encounter of 10/27/21 (from the past 48 hour(s))  ?Basic metabolic panel     Status: Abnormal  ? Collection Time: 10/29/21  4:24 AM  ?Result Value Ref [place]  ? Sodium 135 135 - 145 mmol/L  ? Potassium 4.1 3.5 - 5.1  mmol/L  ? Chloride 102 98 - 111 mmol/L  ? CO2 27 22 - 32 mmol/L  ? Glucose, Bld 87 70 - 99 mg/dL  ?  Comment: Glucose reference range applies only to samples taken after fasting for at least 8 hours.  ? BUN 26 (H) 8 - 23 mg/dL  ? Creatinine, Ser 0.91 0.44 - 1.00 mg/dL  ? Calcium 9.2 8.9 - 10.3 mg/dL  ? GFR, Estimated >60 >60 mL/min  ?  Comment: (NOTE) ?Calculated using the CKD-EPI Creatinine Equation (2021) ?  ? Anion gap 6 5 - 15  ?  Comment: Performed at [facility], [place] [address] [place], [place] [address]  ?Magnesium     Status: None  ? Collection Time: 10/29/21  4:24 AM  ?Result Value Ref [place]  ? Magnesium 2.2 1.7 -  2.4 mg/dL  ?  Comment: Performed at [facility], [place] [address], [place], [place] [address]  ?CK     Status: Abnormal  ? Collection Time: 10/29/21  4:24 AM  ?Result Value Ref [place]  ? Total CK 374 (H) 38 - 234 U/L  ?  Comment: Performed at [facility], [place] [address] [place], [place] [address]  ? ? ?CT ABDOMEN PELVIS WO CONTRAST ? ?Result Date: 10/30/2021 ?CLINICAL DATA:  Left groin mass.  Vomiting. EXAM: CT ABDOMEN AND PELVIS WITHOUT CONTRAST TECHNIQUE: Multidetector CT imaging of the abdomen and pelvis was performed following the standard protocol without IV contrast. RADIATION DOSE REDUCTION: This exam was performed according to the departmental dose-optimization program which includes automated exposure control, adjustment of the mA and/or kV according to patient size and/or use of iterative reconstruction technique. COMPARISON:  CT 08/25/2018 FINDINGS: Lower chest: Normal heart size with coronary artery calcifications. No airspace consolidation or pleural effusion. Hepatobiliary: No evidence of focal liver lesion on this unenhanced exam. Calcified hepatic granulomas. Unchanged appearance of 2.6 cm lamellated gallstone. Gallbladder is decompressed. No pericholecystic inflammation. No biliary dilatation. Pancreas: Atrophic and poorly evaluated. No ductal dilatation or inflammation. Spleen: Small in size and medially displaced by tortuous colon. No focal abnormality. Adrenals/Urinary Tract: No adrenal nodule. No hydronephrosis. No renal calculi. Low-density renal lesions are consistent with cysts, although incompletely characterized on this unenhanced exam, grossly stable from prior. No dedicated further follow-up is recommended. Urinary bladder is only minimally distended, wall thickening about the bladder dome. Stomach/Bowel: Small to moderate size left inguinal hernia containing short segment of small bowel. The adjacent small bowel is mildly dilated measuring 3.3 cm and  fluid-filled. Minimal mesenteric edema within the small bowel mesentery. There is mild fecalization of small bowel contents. No bowel pneumatosis. Moderate volume of colonic stool. Mild left colonic diverticulosis without focal diverticulitis or acute colonic inflammation. Vascular/Lymphatic: Moderate to advanced aortic atherosclerosis. Aortic tortuosity. No portal venous or mesenteric gas. Limited assessment for adenopathy on this unenhanced exam, no bulky enlarged lymph nodes. Particularly, no inguinal adenopathy. Reproductive: Hysterectomy.  No evident adnexal mass. Other: No ascites or free fluid. No free air. Bubbles of gas in the anterior abdominal wall soft tissues likely related to medication injection sites. Musculoskeletal: Scoliosis and multilevel degenerative change in the spine. Degenerative change of the pubic symphysis, sacroiliac joints and and both hips. No focal bone lesion or acute osseous findings. IMPRESSION: 1. Small to moderate size left inguinal hernia containing short segment of small bowel. The adjacent small bowel is mildly dilated and fluid-filled with mild fecalization of small bowel contents. Findings consistent with early small bowel  obstruction. 2. Colonic diverticulosis without acute diverticulitis. 3. Cholelithiasis without evidence of acute cholecystitis. 4. Urinary bladder wall thickening about the bladder dome, possibly related to nondistention. Recommend correlation with urinalysis to exclude urinary tract infection. Aortic Atherosclerosis (ICD10-I70.0). Electronically Signed   By: Keith Rake M.D.   On: 10/30/2021 18:23   ? ?Review of Systems  ?Constitutional: Negative.   ?HENT: Negative.    ?Eyes: Negative.   ?Respiratory: Negative.    ?Cardiovascular: Negative.   ?Gastrointestinal:  Positive for nausea and vomiting.  ?Endocrine: Negative.   ?Genitourinary: Negative.   ?Musculoskeletal: Negative.   ?Skin: Negative.   ?Allergic/Immunologic: Negative.   ?Neurological:  Negative.   ?Hematological: Negative.   ?Psychiatric/Behavioral: Negative.    ?Blood pressure 110/66, pulse 81, temperature 98.7 ?F (37.1 ?C), temperature source Oral, resp. rate 20, height '5\' 2"'$  (1.575 m), weigh

## 2021-10-30 NOTE — Progress Notes (Addendum)
?PROGRESS NOTE ?Brenda Alexander  SJG:[id number] DOB: 03-11-1933 DOA: 10/27/2021 ?PCP: [name], NP  ? ?Brief Narrative/Hospital Course: ?74 yof w/ DM2,HTN,chronic HFpEF presented after a fall.She apparently was able to relay her story to the ED- she was trying to transfer from a recliner night PTA and apparently slid to the floor. She was unable to get up on her own. She didn't hit her head. She tried to call her daughter, but her daughter was unable to pick up the phone. Family checked on her in morning and saw that she had been on the floor all night. ?In the ED, labs w: Na+ 132 CK 844 Trp 37 -> 36 WBC  5.2 ?EKG: sinus, no st elevations; prolong QT. CTH/CT cspine: "1. No acute intracranial abnormality. 2. No acute fracture or subluxation of the cervical spine. 3. Advanced facet arthropathy on the left at C2-3 with slight widening of the joint space and erosions suggesting an underlying inflammatory or crystalline arthropathy.4. Moderate chronic microvascular ischemic change and diffuse cerebral volume loss." ?CXR:  1. Low lung volumes with mildly increased bibasilar interstitial markings which could be due to atelectasis or early infiltrate. 2. Mild cardiomegaly.  No evidence of UTI. ?Patient admitted for rhabdomyolysis mild hyponatremia and want this to fall/possible near syncope.  Patient orthostatic vitals were negative.  Echocardiogram showed EF more than 75%, mild concentric LVH with severe proximal septal thickening, no RWMA, normal RV systolic function, left atrium severely dilated aortic valve is tricuspid mitral valve is normal ?Patient was found to be deconditioned weak PT OT evaluation recommended skilled nursing facility placement.  At this point patient is medically stable ?Subjective: ?Seen and examined this morning.  No new complaints resting comfortably.  Waiting for placement.   ? ?Assessment and Plan: ?Active Problems: ?  Essential hypertension ?  Type 2 diabetes mellitus ([place]) ?  Chronic heart  failure with preserved ejection fraction ([place]) ?  Multiple falls ?  Near syncope ?  HLD (hyperlipidemia) ?  Rhabdomyolysis ? ?Rhabdomyolysis ?Mild hyponatremia: ?Suspect component of dehydration and traumatic mild rhabdomyolysis -CK much improved discontinued IV fluids, encourage oral intake.   ? ?Fall at home questionable near syncope: CT head negative for acute finding.  Echocardiogram EF more than 75%, no RWMA, normal mitral valve tricuspid AV.negative orthostatic vitals ? ?Nonsustained V. tach : Monitoring telemetry.  Electrolytes stable mag phos ? ?Subtle/mildly elevated troponin likely demand ischemia troponin 37> 36, EKG nonischemic.  Follow-up echocardiogram unremarkable. ? ?T2DM diet controlled monitor CBG and SSI ?Recent Labs  ?Lab 10/27/21 ?1200 10/28/21 ?0500  ?GLUCAP 96 94  ? ? ?Chronic HFpEF: Compensated .  EF is preserved.  ?Hyperlipidemia on statin-tolerating CK is downtrending, so will keep on and monitor. ?Hypertension BP fairly controlled on Imdur aldactone clonidine ?Deconditioning debility continue PT OT and placement. ? ?Addendum: ?Rn noticed left groin firm swelling- pt had BM earlier, c/o pain-non tender left groin mass - likely left inguinal hernia- has fat containing hernia in ct in 2020. Discussed w/ Dr Marlou Starks- recommends CT abd and his team can see/follow up. Pt reports she had swelling before but gets better after miralax/ abd BM. Swelling not reducible.Pt not in distress, passing gas. ? ?DVT prophylaxis: enoxaparin (LOVENOX) injection 40 mg Start: 10/27/21 2200 ?Code Status:   Code Status: Full Code ?Family Communication: plan of care discussed with patient/daughter at bedside. ?Patient status is: Observation ?Level of care: Telemetry  ?Patient currently Stable ? ?Dispo: The patient is from: home ?  Anticipated disposition: SNF once bed available.  Medically stable for discharge ? ?Mobility Assessment (last 72 hours)   ? ? Mobility Assessment   ? ? [place] Name 10/30/21 8088 10/29/21  0900 10/28/21 2300 10/28/21 1551 10/28/21 1028  ? Does patient have an order for bedrest or is patient medically unstable -- No - Continue assessment No - Continue assessment -- --  ? What is the highest level of mobility based on the progressive mobility assessment? Level 3 (Stands with assist) - Balance while standing  and cannot march in place Level 3 (Stands with assist) - Balance while standing  and cannot march in place Level 3 (Stands with assist) - Balance while standing  and cannot march in place Level 3 (Stands with assist) - Balance while standing  and cannot march in place Level 3 (Stands with assist) - Balance while standing  and cannot march in place  ? ? [place] Name 10/28/21 [phone] 10/27/21 2043 10/27/21 1658  ?  ?  ? Does patient have an order for bedrest or is patient medically unstable No - Continue assessment No - Continue assessment No - Continue assessment    ? What is the highest level of mobility based on the progressive mobility assessment? Level 3 (Stands with assist) - Balance while standing  and cannot march in place -- Level 3 (Stands with assist) - Balance while standing  and cannot march in place    ? ?  ?  ? ?  ?  ? ?Objective: ?Vitals last 24 hrs: ?Vitals:  ? 10/29/21 1955 10/29/21 2200 10/30/21 0500 10/30/21 0526  ?BP: 133/71 (!) 156/76  (!) 165/98  ?Pulse: 91   77  ?Resp: 20   20  ?Temp: 97.7 ?F (36.5 ?C)   98.2 ?F (36.8 ?C)  ?TempSrc: Oral   Oral  ?SpO2: 99%   100%  ?Weight:   69.7 kg   ?Height:      ? ?Weight change: -1.182 kg ? ?Physical Examination: ? ?General exam: AA0X3,older than stated age, weak appearing. ?HEENT:Oral mucosa moist, Ear/Nose WNL grossly, dentition normal. ?Respiratory system: bilaterally diminished,no use of accessory muscle ?Cardiovascular system: S1 & S2 +, No JVD,. ?Gastrointestinal system: Abdomen soft,NT,ND, BS+ ?Nervous System:Alert, awake, moving extremities and grossly nonfocal ?Extremities: edema neg,distal peripheral pulses palpable.  ?Skin: No rashes,no  icterus. ?MSK: Normal muscle bulk,tone, power ? ?Medications reviewed:  ?Scheduled Meds: ? cloNIDine  0.2 mg Oral BID  ? enoxaparin (LOVENOX) injection  40 mg Subcutaneous Q24H  ? isosorbide mononitrate  20 mg Oral Daily  ? pantoprazole  40 mg Oral Daily  ? simvastatin  20 mg Oral QHS  ? sodium chloride flush  3 mL Intravenous Q12H  ? spironolactone  12.5 mg Oral Daily  ? ?Continuous Infusions: ? ? ? ?  ?Diet Order   ? ?       ?  Diet Carb Modified Fluid consistency: Thin; Room service appropriate? Yes  Diet effective now       ?  ? ?  ?  ? ?  ?  ? ?  ?  ?  ? ? ?Intake/Output Summary (Last 24 hours) at 10/30/2021 1132 ?Last data filed at 10/29/2021 2200 ?Gross per 24 hour  ?Intake 360 ml  ?Output 400 ml  ?Net -40 ml  ? ? ?Net IO Since Admission: 111.1 mL [10/30/21 1132]  ?Wt Readings from Last 3 Encounters:  ?10/30/21 69.7 kg  ?10/16/21 75.3 kg  ?05/24/21 70.8 kg  ?  ? ?Unresulted Labs (From  admission, onward)  ? ? None  ? ?  ?Data Reviewed: I have personally reviewed following labs and imaging studies ?CBC: ?Recent Labs  ?Lab 11/23/2021 ?1202 10/28/21 ?0403  ?WBC 5.2 4.8  ?NEUTROABS 3.9  --   ?HGB 12.7 10.9*  ?HCT 38.9 33.9*  ?MCV 85.7 86.5  ?PLT 232 231  ? ? ?Basic Metabolic Panel: ?Recent Labs  ?Lab 2021/11/23 ?1202 10/28/21 ?0403 10/29/21 ?0424  ?NA 132* 134* 135  ?K 3.5 3.5 4.1  ?CL 96* 98 102  ?CO2 '27 28 27  '$ ?GLUCOSE 97 90 87  ?BUN 21 26* 26*  ?CREATININE 0.89 1.05* 0.91  ?CALCIUM 9.7 9.2 9.2  ?MG  --  2.4 2.2  ? ? ?GFR: ?Estimated Creatinine Clearance: 40.6 mL/min (by C-G formula based on SCr of 0.91 mg/dL). ?Liver Function Tests: ?Recent Labs  ?Lab 11-23-21 ?1202 10/28/21 ?0403  ?AST 41 31  ?ALT 15 14  ?ALKPHOS 74 66  ?BILITOT 0.8 0.7  ?PROT 7.2 6.0*  ?ALBUMIN 4.1 3.4*  ? ? ?No results for input(s): LIPASE, AMYLASE in the last 168 hours. ?No results for input(s): AMMONIA in the last 168 hours. ?Coagulation Profile: ?No results for input(s): INR, PROTIME in the last 168 hours. ?BNP (last 3 results) ?No results for  input(s): PROBNP in the last 8760 hours. ?HbA1C: ?No results for input(s): HGBA1C in the last 72 hours. ?CBG: ?Recent Labs  ?Lab Nov 23, 2021 ?1200 10/28/21 ?0500  ?GLUCAP 96 94  ? ? ?Lipid Profile: ?No resu

## 2021-10-30 NOTE — Progress Notes (Signed)
Occupational Therapy Treatment ?Patient Details ?Name: Brenda Alexander ?MRN: [id number] ?DOB: 1933-07-06 ?Today's Date: 10/30/2021 ? ? ?History of present illness 88 y.o. female with medical history significant of DM2, HTN, chronic HFpEF. Presenting after a fall. Dx of rhabdomyolisis. Pt had ED visit 10/05/21 for a fall. ?  ?OT comments ? Patient was able to participate in sit to stand at edge of bed with max A with noted sliding of BLE with need to block feet. Patient was able to stand for about 30 seconds with shakiness. Patient was min A +2 to transfer from edge of bed with [facility] raised to recliner in room with RW. Patient participated in theract to increase functional activity tolerance and reduce assist for transfers. Patient's discharge plan remains appropriate at this time. OT will continue to follow acutely.  ?  ? ?Recommendations for follow up therapy are one component of a multi-disciplinary discharge planning process, led by the attending physician.  Recommendations may be updated based on patient status, additional functional criteria and insurance authorization. ?   ?Follow Up Recommendations ? Skilled nursing-short term rehab (<3 hours/day)  ?  ?Assistance Recommended at Discharge Frequent or constant Supervision/Assistance  ?Patient can return home with the following ? Two people to help with walking and/or transfers;A lot of help with bathing/dressing/bathroom;Assistance with cooking/housework;Direct supervision/assist for medications management;Assist for transportation;Help with stairs or ramp for entrance;Direct supervision/assist for financial management ?  ?Equipment Recommendations ? Other (comment) (defer to next venue)  ?  ?Recommendations for Other Services   ? ?  ?Precautions / Restrictions Precautions ?Precautions: Fall ?Precaution Comments: shakiness in LUE ?Restrictions ?Weight Bearing Restrictions: No  ? ? ?  ? ?Mobility Bed Mobility ?  ?  ?  ?  ?  ?  ?  ?  ?  ? ?Transfers ?  ?  ?  ?  ?  ?  ?  ?   ?  ?  ?  ?  ?Balance   ?  ?  ?  ?  ?  ?  ?  ?  ?  ?  ?  ?  ?  ?  ?  ?  ?  ?  ?   ? ?ADL either performed or assessed with clinical judgement  ? ?ADL Overall ADL's : Needs assistance/impaired ?  ?  ?  ?  ?  ?  ?  ?  ?  ?  ?  ?  ?  ?Toilet Transfer Details (indicate cue type and reason): patient was min A +2 for transfers with RW with noted sliding of BLE with gripper socks adjustemnet and BLE blocked for sit to stand transition. patient was eduated on proper positioning of feet for sit to stand transitioning. ?  ?  ?  ?  ?  ?  ?  ? ?Extremity/Trunk Assessment   ?  ?  ?  ?  ?  ? ?Vision   ?  ?  ?Perception   ?  ?Praxis   ?  ? ?Cognition Arousal/Alertness: Awake/alert ?Behavior During Therapy: [facility] for tasks assessed/performed ?Overall Cognitive Status: Within Functional Limits for tasks assessed ?  ?  ?  ?  ?  ?  ?  ?  ?  ?  ?  ?  ?  ?  ?  ?  ?General Comments: patient is plesant and motivated to participate in session ?  ?  ?   ?Exercises Other Exercises ?Other Exercises: patient was educated on recliner push ups with patient able to complete 5  reps with noted sliding of BLE with needing to block BLE to maintain proper positioning for tasks with min guard. ? ?  ?Shoulder Instructions   ? ? ?  ?General Comments    ? ? ?Pertinent Vitals/ Pain       Pain Assessment ?Pain Assessment: No/denies pain ? ?Home Living   ?  ?  ?  ?  ?  ?  ?  ?  ?  ?  ?  ?  ?  ?  ?  ?  ?  ?  ? ?  ?Prior Functioning/Environment    ?  ?  ?  ?   ? ?Frequency ? Min 2X/week  ? ? ? ? ?  ?Progress Toward Goals ? ?OT Goals(current goals can now be found in the care plan section) ? Progress towards OT goals: Progressing toward goals ? ?   ?Plan Discharge plan remains appropriate   ? ?Co-evaluation ? ? ?   ?  ?  ?  ?  ? ?  ?AM-PAC OT "6 Clicks" Daily Activity     ?Outcome Measure ? ? Help from another person eating meals?: A Little ?Help from another person taking care of personal grooming?: A Little ?Help from another person toileting, which includes  using toliet, bedpan, or urinal?: Total ?Help from another person bathing (including washing, rinsing, drying)?: A Lot ?Help from another person to put on and taking off regular upper body clothing?: A Little ?Help from another person to put on and taking off regular lower body clothing?: A Lot ?6 Click Score: 14 ? ?  ?End of Session Equipment Utilized During Treatment: Gait belt;Rolling walker (2 wheels) ? ?OT Visit Diagnosis: Unsteadiness on feet (R26.81);Muscle weakness (generalized) (M62.81);History of falling (Z91.81);Other abnormalities of gait and mobility (R26.89) ?  ?Activity Tolerance Patient tolerated treatment well ?  ?Patient Left in chair;with call bell/phone within reach;with chair alarm set ?  ?Nurse Communication Mobility status ?  ? ?   ? ?Time: 5997-7414 ?OT Time Calculation (min): 19 min ? ?Charges: OT General Charges ?$OT Visit: 1 Visit ?OT Treatments ?$Therapeutic Activity: 8-22 mins ? ?Nadelyn Enriques OTR/L, MS ?Acute Rehabilitation Department ?Office# [phone] ?Pager# [phone] ? ? ?Feliz Beam Shaunie Boehm ?10/30/2021, 9:35 AM ?

## 2021-10-30 NOTE — Discharge Summary (Incomplete)
Physician Discharge Summary  ?Brenda Alexander TUU:[id number] DOB: 03-26-1933 DOA: 10/27/2021 ? ?PCP: [name], NP ? ?Admit date: 10/27/2021 ?Discharge date: 10/30/2021 ?Recommendations for Outpatient Follow-up:  ?Follow up with PCP in 1 weeks-call for appointment ?Please obtain BMP/CBC in one week ? ?Discharge Dispo: Home ?Discharge Condition: Stable ?Code Status:   Code Status: Full Code ?Diet recommendation:  ?Diet Order   ? ?       ?  Diet Carb Modified Fluid consistency: Thin; Room service appropriate? Yes  Diet effective now       ?  ? ?  ?  ? ?  ?  ? ?Brief/Interim Summary: ?13 yof w/ DM2,HTN,chronic HFpEF presented after a fall.She apparently was able to relay her story to the ED- she was trying to transfer from a recliner night PTA and apparently slid to the floor. She was unable to get up on her own. She didn't hit her head. She tried to call her daughter, but her daughter was unable to pick up the phone. Family checked on her in morning and saw that she had been on the floor all night. ?In the ED, labs w: Na+ 132 CK 844 Trp 37 -> 36 WBC  5.2 ?EKG: sinus, no st elevations; prolong QT. CTH/CT cspine: "1. No acute intracranial abnormality. 2. No acute fracture or subluxation of the cervical spine. 3. Advanced facet arthropathy on the left at C2-3 with slight widening of the joint space and erosions suggesting an underlying inflammatory or crystalline arthropathy.4. Moderate chronic microvascular ischemic change and diffuse cerebral volume loss." ?CXR:  1. Low lung volumes with mildly increased bibasilar interstitial markings which could be due to atelectasis or early infiltrate. 2. Mild cardiomegaly.  No evidence of UTI. ?Patient admitted for rhabdomyolysis mild hyponatremia and want this to fall/possible near syncope.  Patient orthostatic vitals were negative.  Echocardiogram showed EF more than 75%, mild concentric LVH with severe proximal septal thickening, no RWMA, normal RV systolic function, left atrium  severely dilated aortic valve is tricuspid mitral valve is normal ?Patient was found to be deconditioned weak PT OT evaluation recommended skilled nursing facility placement.  At this point patient is medically stable ? ?Discharge Diagnoses:  ?Active Problems: ?  Essential hypertension ?  Type 2 diabetes mellitus ([place]) ?  Chronic heart failure with preserved ejection fraction ([place]) ?  Multiple falls ?  Near syncope ?  HLD (hyperlipidemia) ?  Rhabdomyolysis ?Rhabdomyolysis ?Mild hyponatremia: ?Suspect component of dehydration and traumatic mild rhabdomyolysis -CK much improved discontinue IV fluids encourage oral intake.   ?  ?Fall at home questionable near syncope: CT head negative for acute finding.  Echocardiogram EF more than 75%, no RWMA, normal mitral valve tricuspid AV.negative orthostatic vitals ?  ?Nonsustained V. tach : Monitoring telemetry.  Electrolytes stable mag phos ?  ?Subtle/mildly elevated troponin likely demand ischemia troponin 37> 36, EKG nonischemic.  Follow-up echocardiogram unremarkable. ?  ?T2DM diet controlled monitor CBG and SSI ?Last Labs   ?    ?Recent Labs  ?Lab 10/27/21 ?1200 10/28/21 ?0500  ?GLUCAP 96 94  ?  ?  ?Chronic HFpEF: Compensated .  EF is preserved.  ?Hyperlipidemia on statin-tolerating CK is downtrending, so will keep on and monitor. ?Hypertension BP fairly controlled on Imdur.  Aldactone clonidine ?Deconditioning debility continue PT OT and placement ?Consults: ?PTOT ? ?Subjective: ?*** ? ?Discharge Exam: ?Vitals:  ? 10/29/21 2200 10/30/21 0526  ?BP: (!) 156/76 (!) 165/98  ?Pulse:  77  ?Resp:  20  ?Temp:  98.2 ?F (  36.8 ?C)  ?SpO2:  100%  ? ?General: Pt is alert, awake, not in acute distress ?Cardiovascular: RRR, S1/S2 +, no rubs, no gallops ?Respiratory: CTA bilaterally, no wheezing, no rhonchi ?Abdominal: Soft, NT, ND, bowel sounds + ?Extremities: no edema, no cyanosis ? ?Discharge Instructions ? ? ?Allergies as of 10/30/2021   ? ?   Reactions  ? Other Anaphylaxis, Swelling   ? Walnuts = Throat swells  ? Penicillins Swelling, Rash  ? Has patient had a PCN reaction causing immediate rash, facial/tongue/throat swelling, SOB or lightheadedness with hypotension: Yes ?Has patient had a PCN reaction causing severe rash involving mucus membranes or skin necrosis: Unk ?Has patient had a PCN reaction that required hospitalization: Unk ?Has patient had a PCN reaction occurring within the last 10 years: No ?If all of the above answers are "NO", then may proceed with Cephalosporin use.  ? ?  ?Med Rec must be completed prior to using this [place]*** ? ? ? ? ? ? ?Allergies  ?Allergen Reactions  ? Other Anaphylaxis and Swelling  ?  Walnuts = Throat swells  ? Penicillins Swelling and Rash  ?  Has patient had a PCN reaction causing immediate rash, facial/tongue/throat swelling, SOB or lightheadedness with hypotension: Yes ?Has patient had a PCN reaction causing severe rash involving mucus membranes or skin necrosis: Unk ?Has patient had a PCN reaction that required hospitalization: Unk ?Has patient had a PCN reaction occurring within the last 10 years: No ?If all of the above answers are "NO", then may proceed with Cephalosporin use. ?  ? ? ?The results of significant diagnostics from this hospitalization (including imaging, microbiology, ancillary and laboratory) are listed below for reference.   ? ?Microbiology: ?No results found for this or any previous visit (from the past 240 hour(s)).  ?Procedures/Studies: ?DG Pelvis 1-2 Views ? ?Result Date: 10/27/2021 ?CLINICAL DATA:  Fall EXAM: PELVIS - 1-2 VIEW COMPARISON:  No prior pelvic radiographs, correlation is made with 08/25/2018 CT abdomen pelvis. FINDINGS: No definite acute fracture on this single view of the pelvis. Degenerative changes in the bilateral hips, sacroiliac joints and at the pubic symphysis. IMPRESSION: No definite acute fracture on this single view of the pelvis. Electronically Signed   By: Merilyn Baba M.D.   On: 10/27/2021 12:23   ? ?CT Head Wo Contrast ? ?Result Date: 10/27/2021 ?CLINICAL DATA:  Mental status change, unknown cause; Neck pain, acute, no red flags EXAM: CT HEAD WITHOUT CONTRAST CT CERVICAL SPINE WITHOUT CONTRAST TECHNIQUE: Multidetector CT imaging of the head and cervical spine was performed following the standard protocol without intravenous contrast. Multiplanar CT image reconstructions of the cervical spine were also generated. RADIATION DOSE REDUCTION: This exam was performed according to the departmental dose-optimization program which includes automated exposure control, adjustment of the mA and/or kV according to patient size and/or use of iterative reconstruction technique. COMPARISON:  None. FINDINGS: Technical note: Images are mildly degraded by motion artifact, particularly affecting the skull vertex and posterior fossa on head CT exam. CT HEAD FINDINGS Brain: No evidence of acute infarction, hemorrhage, hydrocephalus, extra-axial collection or mass lesion/mass effect. Moderate low-density changes within the periventricular and subcortical white matter compatible with chronic microvascular ischemic change. Mild-moderate diffuse cerebral volume loss. Vascular: No hyperdense vessel or unexpected calcification. Skull: Normal. Negative for fracture or focal lesion. Sinuses/Orbits: Mucosal thickening of the right maxillary sinus. Other: None. CT CERVICAL SPINE FINDINGS Alignment: Facet joints are aligned without dislocation or traumatic listhesis. Dens and lateral masses are aligned. Straightening of the  cervical lordosis. Skull base and vertebrae: No acute fracture. No primary bone lesion or focal pathologic process. Soft tissues and spinal canal: No prevertebral fluid or swelling. No visible canal hematoma. Disc levels: Multilevel degenerative disc disease, most pronounced at C3-4. Advanced facet arthropathy on the left at C2-3 with slight widening of the joint space and erosions suggesting an underlying inflammatory or  crystalline arthropathy. The right C4-5 facet joint is fused. Upper chest: Negative. Other: Bilateral carotid atherosclerosis. IMPRESSION: 1. No acute intracranial abnormality. 2. No acute fracture or subluxat

## 2021-10-30 NOTE — TOC Progression Note (Addendum)
Transition of Care (TOC) - Progression Note  ? ? ?Patient Details  ?Name: Jaymee Tilson ?MRN: [id number] ?Date of Birth: [birth year] ? ?Transition of Care (TOC) CM/SW Contact  ?Dessa Phi, RN ?Phone Number: ?10/30/2021, 10:37 AM ? ?Clinical Narrative: Will start auth for [facility] per family choice. [facility] will have bed available tomorrow. Await auth.  ?-11:35a-Patient is Non [employer] [place]-[facility] rep [name] aware to initiate auth.  ? ? ? ?Expected Discharge Plan: [place] ?Barriers to Discharge: Insurance Authorization ? ?Expected Discharge Plan and Services ?Expected Discharge Plan: [place] ?  ?Discharge Planning Services: CM Consult ?Post Acute Care Choice: [place] ?Living arrangements for the past 2 months: [place] ?                ?  ?  ?  ?  ?  ?  ?  ?  ?  ?  ? ? ?Social Determinants of Health (SDOH) Interventions ?  ? ?Readmission Risk Interventions ?   ? View : No data to display.  ?  ?  ?  ? ? ?

## 2021-10-31 ENCOUNTER — Observation Stay (HOSPITAL_BASED_OUTPATIENT_CLINIC_OR_DEPARTMENT_OTHER): Payer: Medicare HMO | Admitting: Certified Registered Nurse Anesthetist

## 2021-10-31 ENCOUNTER — Encounter (HOSPITAL_COMMUNITY): Payer: Self-pay | Admitting: Internal Medicine

## 2021-10-31 ENCOUNTER — Observation Stay (HOSPITAL_COMMUNITY): Payer: Medicare HMO | Admitting: Certified Registered Nurse Anesthetist

## 2021-10-31 ENCOUNTER — Encounter (HOSPITAL_COMMUNITY)
Admission: EM | Disposition: A | Discharge status: Skilled Nursing Facility | Payer: Self-pay | Source: Home / Self Care | Attending: Emergency Medicine

## 2021-10-31 ENCOUNTER — Other Ambulatory Visit: Payer: Self-pay

## 2021-10-31 DIAGNOSIS — K4030 Unilateral inguinal hernia, with obstruction, without gangrene, not specified as recurrent: Secondary | ICD-10-CM | POA: Diagnosis not present

## 2021-10-31 HISTORY — PX: INGUINAL HERNIA REPAIR: SHX194

## 2021-10-31 LAB — BASIC METABOLIC PANEL
Anion gap: 7 (ref 5–15)
BUN: 16 mg/dL (ref 8–23)
CO2: 28 mmol/L (ref 22–32)
Calcium: 9.2 mg/dL (ref 8.9–10.3)
Chloride: 101 mmol/L (ref 98–111)
Creatinine, Ser: 0.59 mg/dL (ref 0.44–1.00)
GFR, Estimated: >60 mL/min (ref >60)
Glucose, Bld: 97 mg/dL (ref 70–99)
Potassium: 4.1 mmol/L (ref 3.5–5.1)
Sodium: 136 mmol/L (ref 135–145)

## 2021-10-31 LAB — CBC
HCT: 37 % (ref 36.0–46.0)
Hemoglobin: 11.5 g/dL — ABNORMAL LOW (ref 12.0–15.0)
MCH: 27.4 pg (ref 26.0–34.0)
MCHC: 31.1 g/dL (ref 30.0–36.0)
MCV: 88.3 fL (ref 80.0–100.0)
Platelets: 224 10*3/uL (ref 150–400)
RBC: 4.19 MIL/uL (ref 3.87–5.11)
RDW: 14.1 % (ref 11.5–15.5)
WBC: 4 10*3/uL (ref 4.0–10.5)
nRBC: 0 % (ref 0.0–0.2)

## 2021-10-31 LAB — SURGICAL PCR SCREEN
MRSA, PCR: NEGATIVE
Staphylococcus aureus: NEGATIVE

## 2021-10-31 SURGERY — HERNIA REPAIR INGUINAL ADULT
Anesthesia: Regional | Site: Inguinal | Laterality: Left

## 2021-10-31 MED ORDER — ROCURONIUM BROMIDE 10 MG/ML (PF) SYRINGE
PREFILLED_SYRINGE | INTRAVENOUS | Status: AC
  Filled 2021-10-31: qty 10

## 2021-10-31 MED ORDER — LIDOCAINE HCL (PF) 2 % IJ SOLN
INTRAMUSCULAR | Status: AC
  Filled 2021-10-31: qty 5

## 2021-10-31 MED ORDER — ACETAMINOPHEN 500 MG PO TABS
1000.0000 mg | ORAL_TABLET | Freq: Three times a day (TID) | ORAL | Status: DC
  Administered 2021-10-31 – 2021-11-04 (×10): 1000 mg via ORAL
  Filled 2021-10-31 (×11): qty 2

## 2021-10-31 MED ORDER — DEXAMETHASONE SODIUM PHOSPHATE 10 MG/ML IJ SOLN
INTRAMUSCULAR | Status: AC
  Filled 2021-10-31: qty 1

## 2021-10-31 MED ORDER — HYDRALAZINE HCL 20 MG/ML IJ SOLN
10.0000 mg | Freq: Once | INTRAMUSCULAR | Status: AC
  Administered 2021-10-31: 10 mg via INTRAVENOUS

## 2021-10-31 MED ORDER — FENTANYL CITRATE PF 50 MCG/ML IJ SOSY
50.0000 ug | PREFILLED_SYRINGE | INTRAMUSCULAR | Status: DC
  Administered 2021-10-31: 50 ug via INTRAVENOUS

## 2021-10-31 MED ORDER — 0.9 % SODIUM CHLORIDE (POUR BTL) OPTIME
TOPICAL | Status: DC | PRN
  Administered 2021-10-31: 1000 mL

## 2021-10-31 MED ORDER — PHENYLEPHRINE 40 MCG/ML (10ML) SYRINGE FOR IV PUSH (FOR BLOOD PRESSURE SUPPORT)
PREFILLED_SYRINGE | INTRAVENOUS | Status: DC | PRN
  Administered 2021-10-31 (×3): 80 ug via INTRAVENOUS

## 2021-10-31 MED ORDER — SUGAMMADEX SODIUM 200 MG/2ML IV SOLN
INTRAVENOUS | Status: DC | PRN
  Administered 2021-10-31: 150 mg via INTRAVENOUS

## 2021-10-31 MED ORDER — OXYCODONE HCL 5 MG PO TABS
2.5000 mg | ORAL_TABLET | ORAL | Status: DC | PRN
  Administered 2021-10-31: 5 mg via ORAL
  Filled 2021-10-31: qty 1

## 2021-10-31 MED ORDER — FENTANYL CITRATE PF 50 MCG/ML IJ SOSY
25.0000 ug | PREFILLED_SYRINGE | INTRAMUSCULAR | Status: DC | PRN
  Administered 2021-10-31 (×2): 25 ug via INTRAVENOUS

## 2021-10-31 MED ORDER — LIDOCAINE 2% (20 MG/ML) 5 ML SYRINGE
INTRAMUSCULAR | Status: DC | PRN
  Administered 2021-10-31: 60 mg via INTRAVENOUS

## 2021-10-31 MED ORDER — ACETAMINOPHEN 10 MG/ML IV SOLN: 1000.0000 mg | Freq: Once | INTRAVENOUS | Status: DC | PRN

## 2021-10-31 MED ORDER — CHLORHEXIDINE GLUCONATE 0.12 % MT SOLN: 15.0000 mL | Freq: Once | OROMUCOSAL | Status: DC

## 2021-10-31 MED ORDER — ONDANSETRON HCL 4 MG/2ML IJ SOLN: 4.0000 mg | Freq: Once | INTRAMUSCULAR | Status: DC | PRN

## 2021-10-31 MED ORDER — ONDANSETRON HCL 4 MG/2ML IJ SOLN
INTRAMUSCULAR | Status: AC
  Filled 2021-10-31: qty 2

## 2021-10-31 MED ORDER — ESMOLOL HCL 100 MG/10ML IV SOLN
INTRAVENOUS | Status: DC | PRN
  Administered 2021-10-31: 20 mg via INTRAVENOUS
  Administered 2021-10-31: 30 mg via INTRAVENOUS

## 2021-10-31 MED ORDER — ONDANSETRON HCL 4 MG/2ML IJ SOLN
INTRAMUSCULAR | Status: DC | PRN
  Administered 2021-10-31: 4 mg via INTRAVENOUS

## 2021-10-31 MED ORDER — BUPIVACAINE-EPINEPHRINE 0.25% -1:200000 IJ SOLN
INTRAMUSCULAR | Status: DC | PRN
  Administered 2021-10-31: 9 mL

## 2021-10-31 MED ORDER — ROPIVACAINE HCL 5 MG/ML IJ SOLN
INTRAMUSCULAR | Status: DC | PRN
  Administered 2021-10-31: 30 mL via PERINEURAL

## 2021-10-31 MED ORDER — FENTANYL CITRATE (PF) 100 MCG/2ML IJ SOLN
INTRAMUSCULAR | Status: DC | PRN
  Administered 2021-10-31: 25 ug via INTRAVENOUS

## 2021-10-31 MED ORDER — PROPOFOL 10 MG/ML IV BOLUS
INTRAVENOUS | Status: DC | PRN
  Administered 2021-10-31: 100 mg via INTRAVENOUS

## 2021-10-31 MED ORDER — FENTANYL CITRATE PF 50 MCG/ML IJ SOSY: 12.5000 ug | PREFILLED_SYRINGE | INTRAMUSCULAR | Status: DC | PRN

## 2021-10-31 MED ORDER — CIPROFLOXACIN IN D5W 400 MG/200ML IV SOLN
400.0000 mg | INTRAVENOUS | Status: AC
  Administered 2021-10-31: 400 mg via INTRAVENOUS
  Filled 2021-10-31: qty 200

## 2021-10-31 MED ORDER — AMISULPRIDE (ANTIEMETIC) 5 MG/2ML IV SOLN: 10.0000 mg | Freq: Once | INTRAVENOUS | Status: DC | PRN

## 2021-10-31 MED ORDER — METHOCARBAMOL 500 MG PO TABS
500.0000 mg | ORAL_TABLET | Freq: Four times a day (QID) | ORAL | Status: DC | PRN
  Administered 2021-10-31 – 2021-11-04 (×4): 500 mg via ORAL
  Filled 2021-10-31 (×4): qty 1

## 2021-10-31 MED ORDER — DEXAMETHASONE SODIUM PHOSPHATE 10 MG/ML IJ SOLN
INTRAMUSCULAR | Status: DC | PRN
  Administered 2021-10-31: 4 mg via INTRAVENOUS

## 2021-10-31 MED ORDER — FENTANYL CITRATE PF 50 MCG/ML IJ SOSY
PREFILLED_SYRINGE | INTRAMUSCULAR | Status: AC
  Filled 2021-10-31: qty 2

## 2021-10-31 MED ORDER — PROPOFOL 10 MG/ML IV BOLUS
INTRAVENOUS | Status: AC
Start: 2021-10-31 — End: ?
  Filled 2021-10-31: qty 20

## 2021-10-31 MED ORDER — FENTANYL CITRATE (PF) 100 MCG/2ML IJ SOLN
INTRAMUSCULAR | Status: AC
  Filled 2021-10-31: qty 2

## 2021-10-31 MED ORDER — FENTANYL CITRATE PF 50 MCG/ML IJ SOSY
PREFILLED_SYRINGE | INTRAMUSCULAR | Status: AC
  Filled 2021-10-31: qty 1

## 2021-10-31 MED ORDER — BUPIVACAINE-EPINEPHRINE (PF) 0.25% -1:200000 IJ SOLN
INTRAMUSCULAR | Status: AC
  Filled 2021-10-31: qty 30

## 2021-10-31 MED ORDER — ROCURONIUM BROMIDE 10 MG/ML (PF) SYRINGE
PREFILLED_SYRINGE | INTRAVENOUS | Status: DC | PRN
Start: 2021-10-31 — End: 2021-10-31
  Administered 2021-10-31: 40 mg via INTRAVENOUS
  Administered 2021-10-31: 10 mg via INTRAVENOUS

## 2021-10-31 MED ORDER — HYDRALAZINE HCL 20 MG/ML IJ SOLN
INTRAMUSCULAR | Status: AC
Start: 2021-10-31 — End: 2021-10-31
  Filled 2021-10-31: qty 1

## 2021-10-31 SURGICAL SUPPLY — 35 items
ADH SKN CLS APL DERMABOND .7 (GAUZE/BANDAGES/DRESSINGS) ×1
APL SKNCLS STERI-STRIP NONHPOA (GAUZE/BANDAGES/DRESSINGS)
BAG COUNTER SPONGE SURGICOUNT (BAG)
BAG SPNG CNTER NS LX DISP (BAG)
BENZOIN TINCTURE PRP APPL 2/3 (GAUZE/BANDAGES/DRESSINGS)
COVER SURGICAL LIGHT HANDLE (MISCELLANEOUS) ×2
DERMABOND ADVANCED (GAUZE/BANDAGES/DRESSINGS) ×1
DERMABOND ADVANCED .7 DNX12 (GAUZE/BANDAGES/DRESSINGS) ×1
DRAIN PENROSE 0.5X18 (DRAIN)
DRAPE LAPAROTOMY T 98X78 PEDS (DRAPES) ×2
DRSG TEGADERM 4X4.75 (GAUZE/BANDAGES/DRESSINGS)
ELECT REM PT RETURN 15FT ADLT (MISCELLANEOUS) ×2
GLOVE BIO SURGEON STRL SZ7.5 (GLOVE) ×2
GLOVE BIOGEL PI ORTHO PRO 7.5 (GLOVE) ×1
GLOVE PI ORTHO PRO STRL 7.5 (GLOVE) ×1
GLOVE SRG 8 PF TXTR STRL LF DI (GLOVE) ×1
GLOVE SURG UNDER POLY LF SZ8 (GLOVE) ×2
GOWN STRL REUS W/ TWL XL LVL3 (GOWN DISPOSABLE) ×2
GOWN STRL REUS W/TWL XL LVL3 (GOWN DISPOSABLE) ×4
KIT BASIN OR (CUSTOM PROCEDURE TRAY) ×2
KIT TURNOVER KIT A (KITS)
MARKER SKIN DUAL TIP RULER LAB (MISCELLANEOUS) ×2
MESH HERNIA 3X6 (Mesh General) ×2 IMPLANT
NEEDLE HYPO 22GX1.5 SAFETY (NEEDLE) ×2
PACK GENERAL/GYN (CUSTOM PROCEDURE TRAY) ×2
SPIKE FLUID TRANSFER (MISCELLANEOUS)
STRIP CLOSURE SKIN 1/2X4 (GAUZE/BANDAGES/DRESSINGS) ×2
SUT MNCRL AB 4-0 PS2 18 (SUTURE) ×2
SUT PROLENE 2 0 CT2 30 (SUTURE) ×4
SUT VIC AB 2-0 SH 27 (SUTURE) ×2
SUT VIC AB 2-0 SH 27X BRD (SUTURE) ×1
SUT VIC AB 3-0 SH 18 (SUTURE) ×2
SYR 20ML LL LF (SYRINGE) ×2
TOWEL OR 17X26 10 PK STRL BLUE (TOWEL DISPOSABLE) ×2
TOWEL OR NON WOVEN STRL DISP B (DISPOSABLE) ×2

## 2021-10-31 NOTE — Progress Notes (Signed)
?PROGRESS NOTE ?Brenda Alexander  ERX:[id number] DOB: August 04, 1932 DOA: 10/27/2021 ?PCP: [name], NP  ? ?Brief Narrative/Hospital Course: ?75 yof w/ DM2,HTN,chronic HFpEF presented after a fall.She apparently was able to relay her story to the ED- she was trying to transfer from a recliner night PTA and apparently slid to the floor. She was unable to get up on her own. She didn't hit her head. She tried to call her daughter, but her daughter was unable to pick up the phone. Family checked on her in morning and saw that she had been on the floor all night. ?In the ED, labs w: Na+ 132 CK 844 Trp 37 -> 36 WBC  5.2 ?EKG: sinus, no st elevations; prolong QT. CTH/CT cspine: "1. No acute intracranial abnormality. 2. No acute fracture or subluxation of the cervical spine. 3. Advanced facet arthropathy on the left at C2-3 with slight widening of the joint space and erosions suggesting an underlying inflammatory or crystalline arthropathy.4. Moderate chronic microvascular ischemic change and diffuse cerebral volume loss." ?CXR:  1. Low lung volumes with mildly increased bibasilar interstitial markings which could be due to atelectasis or early infiltrate. 2. Mild cardiomegaly.  No evidence of UTI. ?Patient admitted for rhabdomyolysis mild hyponatremia and want this to fall/possible near syncope.  Patient orthostatic vitals were negative.  Echocardiogram showed EF more than 75%, mild concentric LVH with severe proximal septal thickening, no RWMA, normal RV systolic function, left atrium severely dilated aortic valve is tricuspid mitral valve is normal. Patient was found to be deconditioned weak PT OT evaluation recommended skilled nursing facility placement. 4/5-patient complained of left groin pain nausea emesis and found to have left groin mass and imaging showed incarcerated left inguinal hernia, general surgery manually reduced it. ?Subjective: ? ?Overnight attributed well. ?Patient this morning again having lump on the left  groin area ?Daughter at the bedside ? ?Assessment and Plan: ?Active Problems: ?  Essential hypertension ?  Type 2 diabetes mellitus ([place]) ?  Chronic heart failure with preserved ejection fraction ([place]) ?  Multiple falls ?  Near syncope ?  HLD (hyperlipidemia) ?  Rhabdomyolysis ?  Incarcerated left inguinal hernia ? ?Incarcerated left inguinal hernia: Patient symptomatic with pain on the left groin nausea emesis that improved with manual reduction/5.  Again reduced manually 4/6.  She has had recurrent constipation and swelling the left groin in the past.  General surgery has been consulted, after further discussion with patient and family they want to proceed with surgical intervention today.  Appreciatively compensated had echocardiogram with EF more than 75% this admission, on room air and hemodynamically stable.  Is n.p.o. and on gentle IV fluids. ? ?Rhabdomyolysis ?Mild hyponatremia: ?Suspect component of dehydration and traumatic mild rhabdomyolysis.  Resolved.  Off IVF. ? ?Fall at home questionable near syncope: CT head negative for acute finding.  Echocardiogram EF more than 75%, no RWMA, normal mitral valve tricuspid AV.negative orthostatic vitals.  Mostly deconditioned ? ?Nonsustained V. tach : Monitoring telemetry.  Electrolytes stable- mag phos ? ?Subtle/mildly elevated troponin likely demand ischemia troponin 37> 36, EKG nonischemic.  Follow-up echocardiogram unremarkable. ? ?T2DM diet controlled, stable.  ?Recent Labs  ?Lab 10/27/21 ?1200 10/28/21 ?0500  ?GLUCAP 96 94  ? ?Chronic HFpEF: Compensated .  EF is preserved.  Echo reviewed. ?Hyperlipidemia on statin ?Hypertension BP fairly controlled on home Imdur aldactone clonidine ?Deconditioning debility continue PT OT and placement. ? ?DVT prophylaxis: enoxaparin (LOVENOX) injection 40 mg Start: 10/27/21 2200 ?Code Status:   Code Status: Full Code ?Family  Communication: plan of care discussed with patient/daughter at bedside. ?Patient status is:  Observation ?Level of care: Telemetry  ?Patient currently Stable ? ?Dispo: The patient is from: home ?           Anticipated disposition: SNF  PENDING surgical clearance  ? ?Mobility Assessment (last 72 hours)   ? ? Mobility Assessment   ? ? [place] Name 10/30/21 2223 10/30/21 0934 10/29/21 0900 10/28/21 2300 10/28/21 1551  ? Does patient have an order for bedrest or is patient medically unstable No - Continue assessment -- No - Continue assessment No - Continue assessment --  ? What is the highest level of mobility based on the progressive mobility assessment? Level 3 (Stands with assist) - Balance while standing  and cannot march in place Level 3 (Stands with assist) - Balance while standing  and cannot march in place Level 3 (Stands with assist) - Balance while standing  and cannot march in place Level 3 (Stands with assist) - Balance while standing  and cannot march in place Level 3 (Stands with assist) - Balance while standing  and cannot march in place  ? Is the above level different from baseline mobility prior to current illness? Yes - Recommend PT order -- -- -- --  ? ? [place] Name 10/28/21 1028  ?  ?  ?  ?  ? What is the highest level of mobility based on the progressive mobility assessment? Level 3 (Stands with assist) - Balance while standing  and cannot march in place      ? ?  ?  ? ?  ?  ? ?Objective: ?Vitals last 24 hrs: ?Vitals:  ? 10/30/21 1301 10/30/21 1942 10/31/21 0552 10/31/21 0800  ?BP: 110/66 (!) 172/74 (!) 171/82   ?Pulse: 81 74 71   ?Resp: '20 20 20   '$ ?Temp: 98.7 ?F (37.1 ?C) 98.8 ?F (37.1 ?C) 98.9 ?F (37.2 ?C)   ?TempSrc: Oral Oral Oral   ?SpO2: 100% 100% 100%   ?Weight:    67 kg  ?Height:      ? ?Weight change:  ? ?Physical Examination: ?General exam: AA0x3, elderly, older than stated age, weak appearing. ?HEENT:Oral mucosa moist, Ear/Nose WNL grossly, dentition normal. ?Respiratory system: bilaterally clear,no use of accessory muscle ?Cardiovascular system: S1 & S2 +, No JVD,. ?Gastrointestinal  system: Abdomen soft,NT,ND, BS+, this morning left groin with lump slightly smaller than yesterday. ?Nervous System:Alert, awake, moving extremities and grossly nonfocal ?Extremities: edema neg,distal peripheral pulses palpable.  ?Skin: No rashes,no icterus. ?MSK: Normal muscle bulk,tone, power ? ?Medications reviewed:  ?Scheduled Meds: ? cloNIDine  0.2 mg Oral BID  ? enoxaparin (LOVENOX) injection  40 mg Subcutaneous Q24H  ? isosorbide mononitrate  20 mg Oral Daily  ? pantoprazole  40 mg Oral Daily  ? simvastatin  20 mg Oral QHS  ? sodium chloride flush  3 mL Intravenous Q12H  ? spironolactone  12.5 mg Oral Daily  ? ?Continuous Infusions: ? ciprofloxacin    ? lactated ringers 30 mL/hr at 10/30/21 1849  ? ? ? ?  ?Diet Order   ? ?       ?  Diet NPO time specified  Diet effective now       ?  ? ?  ?  ? ?  ?  ? ?  ?  ?  ? ? ?Intake/Output Summary (Last 24 hours) at 10/31/2021 1015 ?Last data filed at 10/31/2021 2992 ?Gross per 24 hour  ?Intake 0 ml  ?Output 1000 ml  ?  Net -1000 ml  ? ?Net IO Since Admission: -888.9 mL [10/31/21 1015]  ?Wt Readings from Last 3 Encounters:  ?10/31/21 67 kg  ?10/16/21 75.3 kg  ?05/24/21 70.8 kg  ?  ? ?Unresulted Labs (From admission, onward)  ? ? None  ? ?  ?Data Reviewed: I have personally reviewed following labs and imaging studies ?CBC: ?Recent Labs  ?Lab 11-21-21 ?1202 10/28/21 ?0403 10/31/21 ?0412  ?WBC 5.2 4.8 4.0  ?NEUTROABS 3.9  --   --   ?HGB 12.7 10.9* 11.5*  ?HCT 38.9 33.9* 37.0  ?MCV 85.7 86.5 88.3  ?PLT 232 231 224  ? ?Basic Metabolic Panel: ?Recent Labs  ?Lab 21-Nov-2021 ?1202 10/28/21 ?0403 10/29/21 ?0424 10/31/21 ?8144  ?NA 132* 134* 135 136  ?K 3.5 3.5 4.1 4.1  ?CL 96* 98 102 101  ?CO2 '27 28 27 28  '$ ?GLUCOSE 97 90 87 97  ?BUN 21 26* 26* 16  ?CREATININE 0.89 1.05* 0.91 0.59  ?CALCIUM 9.7 9.2 9.2 9.2  ?MG  --  2.4 2.2  --   ? ?GFR: ?Estimated Creatinine Clearance: 45.3 mL/min (by C-G formula based on SCr of 0.59 mg/dL). ?Liver Function Tests: ?Recent Labs  ?Lab November 21, 2021 ?1202  10/28/21 ?0403  ?AST 41 31  ?ALT 15 14  ?ALKPHOS 74 66  ?BILITOT 0.8 0.7  ?PROT 7.2 6.0*  ?ALBUMIN 4.1 3.4*  ? ?No results for input(s): LIPASE, AMYLASE in the last 168 hours. ?No results for input(s): AMMONIA in the l

## 2021-10-31 NOTE — Anesthesia Preprocedure Evaluation (Addendum)
Anesthesia Evaluation  ?Patient identified by MRN, date of birth, ID band ?Patient awake ? ? ? ?Reviewed: ?Allergy & Precautions, NPO status , Patient's Chart, lab work & pertinent test results ? ?Airway ?Mallampati: II ? ?TM Distance: >3 FB ?Neck ROM: Full ? ? ? Dental ? ?(+) Edentulous Upper, Edentulous Lower ?  ?Pulmonary ?neg pulmonary ROS,  ?  ?Pulmonary exam normal ? ? ? ? ? ? ? Cardiovascular ?hypertension, Pt. on medications and Pt. on home beta blockers ?+ CAD  ?Normal cardiovascular exam ? ?Normal LV function; mild concentric LVH with severe proximal septal thickening; mid LV cavity gradient with peak velocity 3.5 m/s. ?Left ventricular ejection fraction, by estimation, is >75%. The left ventricle has hyperdynamic function. The left ventricle has no regional wall motion abnormalities. ?There is mild left ventricular hypertrophy. Left ventricular diastolic parameters are consistent with Grade I diastolic dysfunction (impaired relaxation). ?Right ventricular systolic function is normal. The right ventricular size is normal. ?There is normal pulmonary artery systolic pressure. ?Left atrial size was severely dilated. ?The mitral valve is normal in structure. No evidence of mitral valve regurgitation. No evidence of mitral stenosis. ?The aortic valve is tricuspid. Aortic valve regurgitation is mild. Mild aortic valve stenosis. ?The inferior vena cava is normal in size with <50% respiratory variability, suggesting ?right atrial pressure of 8 mmHg. ?  ?Neuro/Psych ?negative neurological ROS ? negative psych ROS  ? GI/Hepatic ?negative GI ROS, Neg liver ROS,   ?Endo/Other  ?diabetes ? Renal/GU ?Renal disease  ? ?  ?Musculoskeletal ? ?(+) Arthritis , Ambulates with walker  ? Abdominal ?  ?Peds ? Hematology ?negative hematology ROS ?(+)   ?Anesthesia Other Findings ?LEFT INGUINAL HERNIA ? Reproductive/Obstetrics ? ?  ? ? ? ? ? ? ? ? ? ? ? ? ? ?  ?  ? ? ? ? ? ? ? ?Anesthesia  Physical ?Anesthesia Plan ? ?ASA: 2 ? ?Anesthesia Plan: General and Regional  ? ?Post-op Pain Management:   ? ?Induction: Intravenous ? ?PONV Risk Score and Plan: 3 and Ondansetron, Dexamethasone and Treatment may vary due to age or medical condition ? ?Airway Management Planned: Oral ETT ? ?Additional Equipment:  ? ?Intra-op Plan:  ? ?Post-operative Plan: Extubation in OR ? ?Informed Consent: I have reviewed the patients History and Physical, chart, labs and discussed the procedure including the risks, benefits and alternatives for the proposed anesthesia with the patient or authorized representative who has indicated his/her understanding and acceptance.  ? ? ? ? ? ?Plan Discussed with: CRNA ? ?Anesthesia Plan Comments:   ? ? ? ? ? ? ?Anesthesia Quick Evaluation ? ?

## 2021-10-31 NOTE — Anesthesia Postprocedure Evaluation (Signed)
Anesthesia Post Note ? ?Patient: Nimah Uphoff ? ?Procedure(s) Performed: OPEN REPAIR LEFT INCARCERATED INGUINAL HERNIA (Left: Inguinal) ? ?  ? ?Patient location during evaluation: PACU ?Anesthesia Type: Regional ?Level of consciousness: awake and alert ?Pain management: pain level controlled ?Vital Signs Assessment: post-procedure vital signs reviewed and stable ?Respiratory status: spontaneous breathing, nonlabored ventilation, respiratory function stable and patient connected to nasal cannula oxygen ?Cardiovascular status: blood pressure returned to baseline and stable ?Postop Assessment: no apparent nausea or vomiting ?Anesthetic complications: no ? ? ?No notable events documented. ? ?Last Vitals:  ?Vitals:  ? 10/31/21 1314 10/31/21 1459  ?BP:  (!) 194/93  ?Pulse: 68 69  ?Resp:  13  ?Temp:  36.6 ?C  ?SpO2: 100% 100%  ?  ?Last Pain:  ?Vitals:  ? 10/31/21 1459  ?TempSrc:   ?PainSc: 0-No pain  ? ? ?  ?  ?  ?  ?  ?  ? ?Jillene Wehrenberg S ? ? ? ? ?

## 2021-10-31 NOTE — Progress Notes (Signed)
Assisted Dr. Roanna Banning with left, transabdominal plane, ultrasound guided block. Side rails up, monitors on throughout procedure. See vital signs in flow sheet. Tolerated Procedure well. ? ?

## 2021-10-31 NOTE — Anesthesia Procedure Notes (Addendum)
Procedure Name: Intubation ?Date/Time: 10/31/2021 1:30 PM ?Performed by: West Pugh, CRNA ?Pre-anesthesia Checklist: Patient identified, Emergency Drugs available, Suction available, Patient being monitored and Timeout performed ?Patient Re-evaluated:Patient Re-evaluated prior to induction ?Oxygen Delivery Method: Circle system utilized ?Preoxygenation: Pre-oxygenation with 100% oxygen ?Induction Type: IV induction ?Ventilation: Mask ventilation without difficulty ?Laryngoscope Size: Mac and 3 ?Grade View: Grade II ?Tube type: Oral ?Tube size: 7.0 mm ?Number of attempts: 1 ?Airway Equipment and Method: Stylet ?Placement Confirmation: ETT inserted through vocal cords under direct vision, positive ETCO2, CO2 detector and breath sounds checked- equal and bilateral ?Secured at: 22 cm ?Tube secured with: Tape ?Dental Injury: Teeth and Oropharynx as per pre-operative assessment  ? ? ? ? ?

## 2021-10-31 NOTE — Transfer of Care (Signed)
Immediate Anesthesia Transfer of Care Note ? ?Patient: Brenda Alexander ? ?Procedure(s) Performed: OPEN REPAIR LEFT INCARCERATED INGUINAL HERNIA (Left: Inguinal) ? ?Patient Location: PACU ? ?Anesthesia Type:General ? ?Level of Consciousness: awake, alert  and oriented ? ?Airway & Oxygen Therapy: Patient Spontanous Breathing and Patient connected to face mask oxygen ? ?Post-op Assessment: Report given to RN and Post -op Vital signs reviewed and stable ? ?Post vital signs: Reviewed and stable ? ?Last Vitals:  ?Vitals Value Taken Time  ?BP    ?Temp    ?Pulse 71 10/31/21 1458  ?Resp 8 10/31/21 1458  ?SpO2 100 % 10/31/21 1458  ?Vitals shown include unvalidated device data. ? ?Last Pain:  ?Vitals:  ? 10/31/21 1203  ?TempSrc: Oral  ?PainSc:   ?   ? ?  ? ?Complications: No notable events documented. ?

## 2021-10-31 NOTE — TOC Progression Note (Signed)
Transition of Care (TOC) - Progression Note  ? ? ?Patient Details  ?Name: Brenda Alexander ?MRN: [id number] ?Date of Birth: 14-Apr-1933 ? ?Transition of Care (TOC) CM/SW Contact  ?Dessa Phi, RN ?Phone Number: ?10/31/2021, 9:30 AM ? ?Clinical Narrative: Noted n/v for possible hernia repair. [name]  has initiated [place].  ? ? ? ?Expected Discharge Plan: [place] ?Barriers to Discharge: Insurance Authorization ? ?Expected Discharge Plan and Services ?Expected Discharge Plan: [place] ?  ?Discharge Planning Services: CM Consult ?Post Acute Care Choice: [place] ?Living arrangements for the past 2 months: [place] ?                ?  ?  ?  ?  ?  ?  ?  ?  ?  ?  ? ? ?Social Determinants of Health (SDOH) Interventions ?  ? ?Readmission Risk Interventions ?   ? View : No data to display.  ?  ?  ?  ? ? ?

## 2021-10-31 NOTE — Anesthesia Procedure Notes (Signed)
Anesthesia Regional Block: TAP block  ? ?Pre-Anesthetic Checklist: , timeout performed,  Correct Patient, Correct Site, Correct Laterality,  Correct Procedure, Correct Position, site marked,  Risks and benefits discussed,  Surgical consent,  Pre-op evaluation,  At surgeon's request and post-op pain management ? ?Laterality: Left ? ?Prep: chloraprep     ?  ?Needles:  ?Injection technique: Single-shot ? ?Needle Type: Echogenic Stimulator Needle   ? ? ?Needle Length: 10cm  ?Needle Gauge: 20  ? ? ? ?Additional Needles: ? ? ?Procedures:,,,, ultrasound used (permanent image in chart),,    ?Narrative:  ?Start time: 10/31/2021 12:40 PM ?End time: 10/31/2021 12:40 PM ?Injection made incrementally with aspirations every 5 mL. ? ?Performed by: Personally  ?Anesthesiologist: Murvin Natal, MD ? ?Additional Notes: ?Functioning IV was confirmed and monitors were applied.  A timeout was performed. Sterile prep, hand hygiene and sterile gloves were used. A 131m 20ga Bbraun echogenic stimulator needle was used. Negative aspiration and negative test dose prior to incremental administration of local anesthetic. The patient tolerated the procedure well. ? ?Ultrasound guidance: relevent anatomy identified, needle position confirmed, local anesthetic spread visualized around nerve(s), vascular puncture avoided.  Image printed for medical record.  ? ? ? ? ? ?

## 2021-10-31 NOTE — Progress Notes (Signed)
?   10/31/21 1718  ?Assess: MEWS Score  ?Temp 98.8 ?F (37.1 ?C)  ?BP (!) 181/101  ?Pulse Rate (!) 134  ?Resp 20  ?Level of Consciousness Alert  ?SpO2 99 %  ?O2 Device Room Air  ?Patient Activity (if Appropriate) In bed  ?Assess: MEWS Score  ?MEWS Temp 0  ?MEWS Systolic 0  ?MEWS Pulse 3  ?MEWS RR 0  ?MEWS LOC 0  ?MEWS Score 3  ?MEWS Score Color Yellow  ?Assess: if the MEWS score is Yellow or Red  ?Were vital signs taken at a resting state? Yes  ?Focused Assessment Change from prior assessment (see assessment flowsheet)  ?Does the patient meet 2 or more of the SIRS criteria? No  ?Does the patient have a confirmed or suspected source of infection? No  ?MEWS guidelines implemented *See Row Information* Yes  ?Treat  ?MEWS Interventions Administered scheduled meds/treatments;Administered prn meds/treatments  ?Pain Scale 0-10  ?Pain Score 0  ?Take Vital Signs  ?Increase Vital Sign Frequency  Yellow: Q 2hr X 2 then Q 4hr X 2, if remains yellow, continue Q 4hrs  ?Escalate  ?MEWS: Escalate Yellow: discuss with charge nurse/RN and consider discussing with provider and RRT  ?Notify: Charge Nurse/RN  ?Name of Charge Nurse/RN Notified Marissa Long  ?Date Charge Nurse/RN Notified 10/31/21  ?Time Charge Nurse/RN Notified 1720  ?Notify: Provider  ?Provider Name/Title Dr. Lupita Leash  ?Date Provider Notified 10/31/21  ?Time Provider Notified 1800  ?Notification Type Page  ?Notification Reason Change in status  ?Provider response See new orders  ?Date of Provider Response 10/31/21  ?Time of Provider Response 1800  ?Assess: SIRS CRITERIA  ?SIRS Temperature  0  ?SIRS Pulse 1  ?SIRS Respirations  0  ?SIRS WBC 0  ?SIRS Score Sum  1  ? ? ?

## 2021-10-31 NOTE — Progress Notes (Signed)
? ?Progress Note ? ?   ?Subjective: ?Had left groin pain, nausea, emesis yesterday before reduction of her hernia. No further symptoms since. Passing flatus. Last BM yesterday prior to hernia reduction. She has been NPO. Patient and daughter who is bedside state she has noticed the swelling in her left groin for at least months and that she has had several similar pain/N/V episodes. She thinks her hernia has been more swollen around those times as well. To manage these episodes she would take laxatives and lay down and her symptoms always resolved and she would be able to have a bowel movement.  ? ? ?Objective: ?Vital signs in last 24 hours: ?Temp:  [98.7 ?F (37.1 ?C)-98.9 ?F (37.2 ?C)] 98.9 ?F (37.2 ?C) (04/06 4259) ?Pulse Rate:  [71-81] 71 (04/06 0552) ?Resp:  [20] 20 (04/06 0552) ?BP: (110-172)/(66-82) 171/82 (04/06 0552) ?SpO2:  [100 %] 100 % (04/06 0552) ?Weight:  [67 kg] 67 kg (04/06 0800) ?Last BM Date : 10/30/21 ? ?Intake/Output from previous day: ?04/05 0701 - 04/06 0700 ?In: -  ?Out: 1000 [Urine:1000] ?Intake/Output this shift: ?No intake/output data recorded. ? ?PE: ?General: pleasant, WD, female who is laying in bed in NAD ?HEENT: head is normocephalic, atraumatic. Mouth is pink and moist ?Heart: regular, rate, and rhythm.  Palpable radial pulses bilaterally ?Lungs: CTAB, no wheezes, rhonchi, or rales noted.  Respiratory effort nonlabored ?Abd: soft, NT, ND, +BS ?GU: left groin hernia present without overlying skin changes. Nontender to palpation. Able to be reduced with firm pressure. Palpable defect with reduction ?MSK: all 4 extremities are symmetrical with no cyanosis, clubbing, or edema. ?Skin: warm and dry with no masses, lesions, or rashes ?Psych: A&Ox3 with an appropriate affect.  ? ? ?Lab Results:  ?Recent Labs  ?  10/31/21 ?0412  ?WBC 4.0  ?HGB 11.5*  ?HCT 37.0  ?PLT 224  ? ?BMET ?Recent Labs  ?  10/29/21 ?0424 10/31/21 ?0412  ?NA 135 136  ?K 4.1 4.1  ?CL 102 101  ?CO2 27 28  ?GLUCOSE 87 97   ?BUN 26* 16  ?CREATININE 0.91 0.59  ?CALCIUM 9.2 9.2  ? ?PT/INR ?No results for input(s): LABPROT, INR in the last 72 hours. ?CMP  ?   ?Component Value Date/Time  ? NA 136 10/31/2021 0412  ? NA 141 04/18/2021 1520  ? K 4.1 10/31/2021 0412  ? CL 101 10/31/2021 0412  ? CO2 28 10/31/2021 0412  ? GLUCOSE 97 10/31/2021 0412  ? BUN 16 10/31/2021 0412  ? BUN 17 04/18/2021 1520  ? CREATININE 0.59 10/31/2021 0412  ? CALCIUM 9.2 10/31/2021 0412  ? PROT 6.0 (L) 10/28/2021 0403  ? ALBUMIN 3.4 (L) 10/28/2021 0403  ? AST 31 10/28/2021 0403  ? ALT 14 10/28/2021 0403  ? ALKPHOS 66 10/28/2021 0403  ? BILITOT 0.7 10/28/2021 0403  ? GFRNONAA >60 10/31/2021 0412  ? GFRAA 48 (L) 08/20/2020 1417  ? ?Lipase  ?   ?Component Value Date/Time  ? LIPASE 19 08/25/2018 1102  ? ? ? ? ? ?Studies/Results: ?CT ABDOMEN PELVIS WO CONTRAST ? ?Result Date: 10/30/2021 ?CLINICAL DATA:  Left groin mass.  Vomiting. EXAM: CT ABDOMEN AND PELVIS WITHOUT CONTRAST TECHNIQUE: Multidetector CT imaging of the abdomen and pelvis was performed following the standard protocol without IV contrast. RADIATION DOSE REDUCTION: This exam was performed according to the departmental dose-optimization program which includes automated exposure control, adjustment of the mA and/or kV according to patient size and/or use of iterative reconstruction technique. COMPARISON:  CT 08/25/2018 FINDINGS:  Lower chest: Normal heart size with coronary artery calcifications. No airspace consolidation or pleural effusion. Hepatobiliary: No evidence of focal liver lesion on this unenhanced exam. Calcified hepatic granulomas. Unchanged appearance of 2.6 cm lamellated gallstone. Gallbladder is decompressed. No pericholecystic inflammation. No biliary dilatation. Pancreas: Atrophic and poorly evaluated. No ductal dilatation or inflammation. Spleen: Small in size and medially displaced by tortuous colon. No focal abnormality. Adrenals/Urinary Tract: No adrenal nodule. No hydronephrosis. No renal  calculi. Low-density renal lesions are consistent with cysts, although incompletely characterized on this unenhanced exam, grossly stable from prior. No dedicated further follow-up is recommended. Urinary bladder is only minimally distended, wall thickening about the bladder dome. Stomach/Bowel: Small to moderate size left inguinal hernia containing short segment of small bowel. The adjacent small bowel is mildly dilated measuring 3.3 cm and fluid-filled. Minimal mesenteric edema within the small bowel mesentery. There is mild fecalization of small bowel contents. No bowel pneumatosis. Moderate volume of colonic stool. Mild left colonic diverticulosis without focal diverticulitis or acute colonic inflammation. Vascular/Lymphatic: Moderate to advanced aortic atherosclerosis. Aortic tortuosity. No portal venous or mesenteric gas. Limited assessment for adenopathy on this unenhanced exam, no bulky enlarged lymph nodes. Particularly, no inguinal adenopathy. Reproductive: Hysterectomy.  No evident adnexal mass. Other: No ascites or free fluid. No free air. Bubbles of gas in the anterior abdominal wall soft tissues likely related to medication injection sites. Musculoskeletal: Scoliosis and multilevel degenerative change in the spine. Degenerative change of the pubic symphysis, sacroiliac joints and and both hips. No focal bone lesion or acute osseous findings. IMPRESSION: 1. Small to moderate size left inguinal hernia containing short segment of small bowel. The adjacent small bowel is mildly dilated and fluid-filled with mild fecalization of small bowel contents. Findings consistent with early small bowel obstruction. 2. Colonic diverticulosis without acute diverticulitis. 3. Cholelithiasis without evidence of acute cholecystitis. 4. Urinary bladder wall thickening about the bladder dome, possibly related to nondistention. Recommend correlation with urinalysis to exclude urinary tract infection. Aortic Atherosclerosis  (ICD10-I70.0). Electronically Signed   By: Keith Rake M.D.   On: 10/30/2021 18:23   ? ?Anti-infectives: ?Anti-infectives (From admission, onward)  ? ? None  ? ?  ? ? ? ?Assessment/Plan ?Incarcerated Left inguinal hernia ?- reduced last night but recurred without symptoms and reduced by me this am ?- likely she has had symptoms from hernia over the last few months ?- Discussed with patient and daughter risks/benefits of inguinal hernia repair including risks of general anesthesia. Hernia is currently reduced without signs/symptoms of incarceration, obstruction, strangulation so surgical intervention is not urgent/emergent but hernia will likely recur and if those complications arise then hernia repair would be urgent/emergent with greater surgical risk. She and daughter would like to think about surgery further before deciding wether or not to proceed today. ? ?FEN: NPO ?ID: none currently ?VTE: lovenox ? ?I reviewed hospitalist notes, last 24 h vitals and pain scores, last 48 h intake and output, last 24 h labs and trends, last 24 h imaging results, and discussed care plan with hospitalist . ? ? ? LOS: 0 days  ? ?Winferd Humphrey, PA-C ?[facility] ?10/31/2021, 8:00 AM ?Please see Amion for pager number during day hours 7:00am-4:30pm ? ?

## 2021-10-31 NOTE — Op Note (Signed)
Open Repair of Left Incarcerated Direct Inguinal hernia with Mesh Procedure Note ? ?Indications: The patient presented with a history of a left, not reducible hernia.   ? ?Pre-operative Diagnosis: left not reducible inguinal hernia ? ?Post-operative Diagnosis: same, left direct incarcerated inguinal hernia ? ?Surgeon: Greer Pickerel MD FACS ? ?Anesthesia: General endotracheal anesthesia + TAP block ? ?Procedure Details  ?The patient was seen again in the Holding Room. The risks, benefits, complications, treatment options, and expected outcomes were discussed with the patient. The possibilities of reaction to medication, pulmonary aspiration, perforation of viscus, bleeding, recurrent infection, the need for additional procedures, and development of a complication requiring transfusion or further operation were discussed with the patient and/or family. The likelihood of success in repairing the hernia and returning the patient to their previous functional status is good.  There was concurrence with the proposed plan, and informed consent was obtained. The site of surgery was properly noted/marked. The patient was taken to the Operating Room, identified as [place], and the procedure verified as left inguinal hernia repair. A Time Out was held and the above information confirmed. ? ?The patient was placed in the supine position and underwent induction of anesthesia. The lower abdomen and groin was prepped with Chloraprep and draped in the standard fashion, and 0.25% Marcaine with epinephrine was used to anesthetize the skin over the mid-portion of the inguinal canal. An oblique incision was made. Dissection was carried down through the subcutaneous tissue with cautery to the external oblique fascia.  We opened the external oblique fascia along the direction of its fibers to the external ring.  The round ligament identified and then transected.  The patient had an incarcerated direct inguinal hernia.  It was  incarcerated with a knuckle of small bowel which was viable.  It was not ischemic nor thick-walled or edematous.  It was reducible through a direct defect.   ? ?I plicated the inguinal floor with 3 interrupted 2-0 Prolene sutures. ? ?I then obtained a piece of Bard mesh [place] by 6 inch mesh trimmed slightly..  This was secured with 2-0 Prolene, at the pubic tubercle. Superiorly, the mesh was secured to the internal oblique fascia with interrupted 2-0 Prolene sutures.  I then sutured the inferior lateral edge of the mesh to the shelving edge of the inguinal ligament with several interrupted 2-0 Prolene sutures.  The ilioinguinal nerve was identified and protected and not involved with the sutures.  The mesh was tucked underneath the external oblique fascia laterally.  The external oblique fascia was reapproximated with 2-0 Vicryl.  3-0 Vicryl was used to close the subcutaneous tissues and 4-0 Monocryl was used to close the skin in subcuticular fashion.  Dermabond was applied.  The patient was then extubated and brought to the recovery room in stable condition.  All sponge, instrument, and needle counts were correct prior to closure and at the conclusion of the case. ? ? ?Estimated Blood Loss: Minimal ?          ?      ?Complications: None; patient tolerated the procedure well. ?        ?Disposition: PACU - hemodynamically stable. ?        ?Condition: stable ? ?Leighton Ruff. Redmond Pulling, MD, FACS ?General, Bariatric, & Minimally Invasive Surgery ?[facility] ? ? ? ?

## 2021-11-01 ENCOUNTER — Encounter (HOSPITAL_COMMUNITY): Payer: Self-pay | Admitting: General Surgery

## 2021-11-01 DIAGNOSIS — K4030 Unilateral inguinal hernia, with obstruction, without gangrene, not specified as recurrent: Secondary | ICD-10-CM | POA: Diagnosis not present

## 2021-11-01 LAB — BASIC METABOLIC PANEL
Anion gap: 9 (ref 5–15)
BUN: 24 mg/dL — ABNORMAL HIGH (ref 8–23)
CO2: 26 mmol/L (ref 22–32)
Calcium: 9.2 mg/dL (ref 8.9–10.3)
Chloride: 99 mmol/L (ref 98–111)
Creatinine, Ser: 1.07 mg/dL — ABNORMAL HIGH (ref 0.44–1.00)
GFR, Estimated: 51 mL/min — ABNORMAL LOW (ref >60)
Glucose, Bld: 99 mg/dL (ref 70–99)
Potassium: 4.2 mmol/L (ref 3.5–5.1)
Sodium: 134 mmol/L — ABNORMAL LOW (ref 135–145)

## 2021-11-01 LAB — CBC
HCT: 36.6 % (ref 36.0–46.0)
Hemoglobin: 11.9 g/dL — ABNORMAL LOW (ref 12.0–15.0)
MCH: 28.5 pg (ref 26.0–34.0)
MCHC: 32.5 g/dL (ref 30.0–36.0)
MCV: 87.6 fL (ref 80.0–100.0)
Platelets: 229 10*3/uL (ref 150–400)
RBC: 4.18 MIL/uL (ref 3.87–5.11)
RDW: 14.3 % (ref 11.5–15.5)
WBC: 7.9 10*3/uL (ref 4.0–10.5)
nRBC: 0 % (ref 0.0–0.2)

## 2021-11-01 MED ORDER — LABETALOL HCL 100 MG PO TABS: 50.0000 mg | ORAL_TABLET | Freq: Two times a day (BID) | ORAL | Status: DC

## 2021-11-01 NOTE — Discharge Summary (Addendum)
Physician Discharge Summary  ?Brenda Alexander BZJ:[id number] DOB: 1933/07/25 DOA: 10/27/2021 ? ?PCP: [name], NP ? ?Admit date: 10/27/2021 ?Discharge date: 11/04/2021 ?Recommendations for Outpatient Follow-up:  ?Follow up with PCP in 1 weeks-call for appointment ?Please obtain BMP/CBC in one week ? ?Discharge Dispo: SNF ?Discharge Condition: Stable ?Code Status:   Code Status: DNR ?Diet recommendation:  ?Diet Order   ? ?       ?  Diet regular Room service appropriate? Yes; Fluid consistency: Thin  Diet effective now       ?  ? ?  ?  ? ?  ?  ? ?Brief/Interim Summary: ?72 yof w/ DM2,HTN,chronic HFpEF presented after a fall.She apparently was able to relay her story to the ED- she was trying to transfer from a recliner night PTA and apparently slid to the floor. She was unable to get up on her own. She didn't hit her head. She tried to call her daughter, but her daughter was unable to pick up the phone. Family checked on her in morning and saw that she had been on the floor all night. ?In the ED, labs w: Na+ 132 CK 844 Trp 37 -> 36 WBC  5.2 ?EKG: sinus, no st elevations; prolong QT. CTH/CT cspine: "1. No acute intracranial abnormality. 2. No acute fracture or subluxation of the cervical spine. 3. Advanced facet arthropathy on the left at C2-3 with slight widening of the joint space and erosions suggesting an underlying inflammatory or crystalline arthropathy.4. Moderate chronic microvascular ischemic change and diffuse cerebral volume loss." ?CXR:  1. Low lung volumes with mildly increased bibasilar interstitial markings which could be due to atelectasis or early infiltrate. 2. Mild cardiomegaly.  No evidence of UTI. ?Patient admitted for rhabdomyolysis mild hyponatremia and want this to fall/possible near syncope.  Patient orthostatic vitals were negative.  Echocardiogram showed EF more than 75%, mild concentric LVH with severe proximal septal thickening, no RWMA, normal RV systolic function, left atrium severely dilated  aortic valve is tricuspid mitral valve is normal. Patient was found to be deconditioned weak PT OT evaluation recommended skilled nursing facility placement. 4/5-patient complained of left groin pain nausea emesis and found to have left groin mass and imaging showed incarcerated left inguinal hernia, general surgery manually reduced it. General surgery took the patient to the OR, was not reducible, then underwent hernia repair surgery 4/6.Postop doing well cleared by surgery for discharge to skilled nursing facility.  Waiting for insurance approval ?Doing well overall.  She is medically stable for discharge pending insurance approval ? ?Discharge Diagnoses:  ?Active Problems: ?  Essential hypertension ?  Type 2 diabetes mellitus ([place]) ?  Chronic heart failure with preserved ejection fraction ([place]) ?  Multiple falls ?  Near syncope ?  HLD (hyperlipidemia) ?  Rhabdomyolysis ?  Incarcerated left inguinal hernia ? ?Incarcerated left inguinal hernia: Patient symptomatic with pain on the left groin nausea emesis that improved with manual reduction 5.General surgery took the patient to the OR, hernia was not reducible, then underwent hernia repair surgery 10/31/21-Postop doing well cleared by surgery, follow-up with surgery outpatient.  Continue diet.  She is tolerating and having bowel movement.  ?  ?Rhabdomyolysis ?Mild hyponatremia: ?Suspect component of dehydration and traumatic mild rhabdomyolysis.  Resolved.  Off IVF. ?Fall at home questionable near syncope: CT head negative for acute finding.  Echocardiogram EF more than 75%, no RWMA, normal mitral valve tricuspid AV.negative orthostatic vitals.  Mostly deconditioned ?Nonsustained V. tach : Monitoring telemetry.  Electrolytes stable- mag  phos ?Subtle/mildly elevated troponin likely demand ischemia troponin 37> 36, EKG nonischemic.  Follow-up echocardiogram unremarkable. ?T2DM diet controlled, stable.  ?Chronic HFpEF: Compensated .  EF is preserved.  Echo  reviewed. ?Hyperlipidemia on statin ?Hypertension BP fairly controlled on home Imdur aldactone clonidine.  Was poorly controlled since patient did not receive clonidine preop and improved after clonidine 4/6 pm ?Deconditioning debility continue PT OT and placement. ? ? Consults: ?General surgery, TOC PT OT ?Subjective: ?Seen and examined this morning she is doing well resting comfortably. ? ?Discharge Exam: ?Vitals:  ? 11/03/21 2007 11/04/21 0423  ?BP: (!) 158/84 (!) 173/90  ?Pulse: 83 73  ?Resp: 20 18  ?Temp: 98.7 ?F (37.1 ?C) 98.3 ?F (36.8 ?C)  ?SpO2: 100% 100%  ? ?General: Pt is alert, awake, not in acute distress ?Cardiovascular: RRR, S1/S2 +, no rubs, no gallops ?Respiratory: CTA bilaterally, no wheezing, no rhonchi ?Abdominal: Soft, NT, ND, bowel sounds + ?Extremities: no edema, no cyanosis ? ?Discharge Instructions ? ?Discharge Instructions   ? ? Discharge instructions   Complete by: As directed ?  ? Follow-up surgery for postop hernia repair ?Follow-up with PCP in 1 week ? ?Please call call MD or return to ER for similar or worsening recurring problem that brought you to hospital or if any fever,nausea/vomiting,abdominal pain, uncontrolled pain, chest pain,  shortness of breath or any other alarming symptoms. ? ?Please follow-up your doctor as instructed in a week time and call the office for appointment. ? ?Please avoid alcohol, smoking, or any other illicit substance and maintain healthy habits including taking your regular medications as prescribed. ? ?You were cared for by a hospitalist during your hospital stay. If you have any questions about your discharge medications or the care you received while you were in the hospital after you are discharged, you can call the unit and ask to speak with the hospitalist on call if the hospitalist that took care of you is not available. ? ?Once you are discharged, your primary care physician will handle any further medical issues. Please note that NO REFILLS for  any discharge medications will be authorized once you are discharged, as it is imperative that you return to your primary care physician (or establish a relationship with a primary care physician if you do not have one) for your aftercare needs so that they can reassess your need for medications and monitor your lab values  ? Increase activity slowly   Complete by: As directed ?  ? No wound care   Complete by: As directed ?  ? ?  ? ?Allergies as of 11/04/2021   ? ?   Reactions  ? Other Anaphylaxis, Swelling  ? Walnuts = Throat swells  ? Penicillins Swelling, Rash  ? Has patient had a PCN reaction causing immediate rash, facial/tongue/throat swelling, SOB or lightheadedness with hypotension: Yes ?Has patient had a PCN reaction causing severe rash involving mucus membranes or skin necrosis: Unk ?Has patient had a PCN reaction that required hospitalization: Unk ?Has patient had a PCN reaction occurring within the last 10 years: No ?If all of the above answers are "NO", then may proceed with Cephalosporin use.  ? ?  ? ?  ?Medication List  ?  ? ?STOP taking these medications   ? ?traMADol 50 MG tablet ?Commonly known as: ULTRAM ?  ? ?  ? ?TAKE these medications   ? ?acetaminophen 500 MG tablet ?Commonly known as: TYLENOL ?Take 1,000 mg by mouth every 6 (six) hours as needed (for  pain). ?  ?CALCIUM + D3 PO ?Take 1 tablet by mouth daily. ?  ?cloNIDine 0.2 MG tablet ?Commonly known as: CATAPRES ?Take 0.2 mg by mouth 2 (two) times daily. ?  ?docusate sodium 100 MG capsule ?Commonly known as: COLACE ?Take 1 capsule (100 mg total) by mouth 2 (two) times daily as needed for mild constipation or moderate constipation. ?  ?ferrous sulfate 325 (65 FE) MG EC tablet ?Take 1 tablet (325 mg total) by mouth 2 (two) times daily. ?What changed: when to take this ?  ?isosorbide mononitrate 20 MG tablet ?Commonly known as: ISMO ?Take 20 mg by mouth daily. ?  ?labetalol 100 MG tablet ?Commonly known as: NORMODYNE ?Take 0.5 tablets (50 mg  total) by mouth 2 (two) times daily. ?  ?melatonin 3 MG Tabs tablet ?Take 1 tablet (3 mg total) by mouth at bedtime as needed. ?  ?Multi For Her 50+ Tabs ?Take 1 tablet by mouth daily. ?  ?omeprazole 20 MG capsule ?Comm

## 2021-11-01 NOTE — TOC Progression Note (Signed)
Transition of Care (TOC) - Progression Note  ? ?Patient Details  ?Name: Brenda Alexander ?MRN: [id number] ?Date of Birth: 15-Jun-1933 ? ?Transition of Care (TOC) CM/SW Contact  ?Sherie Don, LCSW ?Phone Number: ?11/01/2021, 11:22 AM ? ?Clinical Narrative: Patient is medically stable for discharge to SNF today. CSW followed up with [name] in admissions at [facility]. Per [facility] Bouton is requiring that the facility do another insurance authorization prior to admission, but the facility will not have a bed available until Monday (11/04/21). Hospitalist updated. TOC to follow. ? ?Expected Discharge Plan: [place] ?Barriers to Discharge: Insurance Authorization ? ?Expected Discharge Plan and Services ?Expected Discharge Plan: [place] ?Discharge Planning Services: CM Consult ?Post Acute Care Choice: [place] ?Living arrangements for the past 2 months: [place] ?Expected Discharge Date: 11/01/21               ? ?Readmission Risk Interventions ?   ? View : No data to display.  ?  ?  ?  ? ?

## 2021-11-01 NOTE — Progress Notes (Signed)
1 Day Post-Op  ? ?Subjective/Chief Complaint: ?Comfortable this morning  ?Pain controlled ?Tolerating po ?Family at bedside ? ? ?Objective: ?Vital signs in last 24 hours: ?Temp:  [97.9 ?F (36.6 ?C)-100.1 ?F (37.8 ?C)] 97.9 ?F (36.6 ?C) (04/07 0518) ?Pulse Rate:  [67-134] 74 (04/07 0518) ?Resp:  [13-22] 18 (04/07 0518) ?BP: (126-218)/(66-101) 146/67 (04/07 0518) ?SpO2:  [96 %-100 %] 100 % (04/07 0518) ?Weight:  [69.2 kg] 69.2 kg (04/07 0518) ?Last BM Date : 10/30/21 ? ?Intake/Output from previous day: ?04/06 0701 - 04/07 0700 ?In: 1600 [I.V.:1400; IV Piggyback:200] ?Out: 1200 [Urine:1200] ?Intake/Output this shift: ?No intake/output data recorded. ? ?Exam: ?Awake and alert ?Looks comfortable ?Abdomen soft, ND, incision healing well ? ?Lab Results:  ?Recent Labs  ?  10/31/21 ?0412 11/01/21 ?0424  ?WBC 4.0 7.9  ?HGB 11.5* 11.9*  ?HCT 37.0 36.6  ?PLT 224 229  ? ?BMET ?Recent Labs  ?  10/31/21 ?0412 11/01/21 ?0424  ?NA 136 134*  ?K 4.1 4.2  ?CL 101 99  ?CO2 28 26  ?GLUCOSE 97 99  ?BUN 16 24*  ?CREATININE 0.59 1.07*  ?CALCIUM 9.2 9.2  ? ?PT/INR ?No results for input(s): LABPROT, INR in the last 72 hours. ?ABG ?No results for input(s): PHART, HCO3 in the last 72 hours. ? ?Invalid input(s): PCO2, PO2 ? ?Studies/Results: ?CT ABDOMEN PELVIS WO CONTRAST ? ?Result Date: 10/30/2021 ?CLINICAL DATA:  Left groin mass.  Vomiting. EXAM: CT ABDOMEN AND PELVIS WITHOUT CONTRAST TECHNIQUE: Multidetector CT imaging of the abdomen and pelvis was performed following the standard protocol without IV contrast. RADIATION DOSE REDUCTION: This exam was performed according to the departmental dose-optimization program which includes automated exposure control, adjustment of the mA and/or kV according to patient size and/or use of iterative reconstruction technique. COMPARISON:  CT 08/25/2018 FINDINGS: Lower chest: Normal heart size with coronary artery calcifications. No airspace consolidation or pleural effusion. Hepatobiliary: No evidence of  focal liver lesion on this unenhanced exam. Calcified hepatic granulomas. Unchanged appearance of 2.6 cm lamellated gallstone. Gallbladder is decompressed. No pericholecystic inflammation. No biliary dilatation. Pancreas: Atrophic and poorly evaluated. No ductal dilatation or inflammation. Spleen: Small in size and medially displaced by tortuous colon. No focal abnormality. Adrenals/Urinary Tract: No adrenal nodule. No hydronephrosis. No renal calculi. Low-density renal lesions are consistent with cysts, although incompletely characterized on this unenhanced exam, grossly stable from prior. No dedicated further follow-up is recommended. Urinary bladder is only minimally distended, wall thickening about the bladder dome. Stomach/Bowel: Small to moderate size left inguinal hernia containing short segment of small bowel. The adjacent small bowel is mildly dilated measuring 3.3 cm and fluid-filled. Minimal mesenteric edema within the small bowel mesentery. There is mild fecalization of small bowel contents. No bowel pneumatosis. Moderate volume of colonic stool. Mild left colonic diverticulosis without focal diverticulitis or acute colonic inflammation. Vascular/Lymphatic: Moderate to advanced aortic atherosclerosis. Aortic tortuosity. No portal venous or mesenteric gas. Limited assessment for adenopathy on this unenhanced exam, no bulky enlarged lymph nodes. Particularly, no inguinal adenopathy. Reproductive: Hysterectomy.  No evident adnexal mass. Other: No ascites or free fluid. No free air. Bubbles of gas in the anterior abdominal wall soft tissues likely related to medication injection sites. Musculoskeletal: Scoliosis and multilevel degenerative change in the spine. Degenerative change of the pubic symphysis, sacroiliac joints and and both hips. No focal bone lesion or acute osseous findings. IMPRESSION: 1. Small to moderate size left inguinal hernia containing short segment of small bowel. The adjacent small  bowel is mildly dilated and fluid-filled  with mild fecalization of small bowel contents. Findings consistent with early small bowel obstruction. 2. Colonic diverticulosis without acute diverticulitis. 3. Cholelithiasis without evidence of acute cholecystitis. 4. Urinary bladder wall thickening about the bladder dome, possibly related to nondistention. Recommend correlation with urinalysis to exclude urinary tract infection. Aortic Atherosclerosis (ICD10-I70.0). Electronically Signed   By: Keith Rake M.D.   On: 10/30/2021 18:23   ? ?Anti-infectives: ?Anti-infectives (From admission, onward)  ? ? Start     Dose/Rate Route Frequency Ordered Stop  ? 10/31/21 1200  ciprofloxacin (CIPRO) IVPB 400 mg       ? 400 mg ?200 mL/hr over 60 Minutes Intravenous On call to O.R. 10/31/21 1012 10/31/21 1404  ? ?  ? ? ?Assessment/Plan: ?s/p Procedure(s): ?OPEN REPAIR LEFT INCARCERATED INGUINAL HERNIA (Left) ? ?POD#1 ?Doing well from the hernia repair. ?We will see PRN this weekend ?Going to rehab at discharge ? ?Coralie Keens MD ?11/01/2021 ? ?

## 2021-11-01 NOTE — Discharge Instructions (Signed)
[facility] _______[facility]  UMBILICAL OR INGUINAL HERNIA REPAIR: POST OP INSTRUCTIONS  Always review your discharge instruction sheet given to you by the facility where your surgery was performed. IF YOU HAVE DISABILITY OR FAMILY LEAVE FORMS, YOU MUST BRING THEM TO THE OFFICE FOR PROCESSING.   DO NOT GIVE THEM TO YOUR DOCTOR.  1. A  prescription for pain medication may be given to you upon discharge.  Take your pain medication as prescribed, if needed.  If narcotic pain medicine is not needed, then you may take acetaminophen (Tylenol) or ibuprofen (Advil) as needed. 2. Take your usually prescribed medications unless otherwise directed. If you need a refill on your pain medication, please contact your pharmacy.  They will contact our office to request authorization. Prescriptions will not be filled after 5 pm or on week-ends. 3. You should follow a light diet the first 24 hours after arrival home, such as soup and crackers, etc.  Be sure to include lots of fluids daily.  Resume your normal diet the day after surgery. 4.Most patients will experience some swelling and bruising around the umbilicus or in the groin and scrotum.  Ice packs and reclining will help.  Swelling and bruising can take several days to resolve.  6. It is common to experience some constipation if taking pain medication after surgery.  Increasing fluid intake and taking a stool softener (such as Colace) will usually help or prevent this problem from occurring.  A mild laxative (Milk of Magnesia or Miralax) should be taken according to package directions if there are no bowel movements after 48 hours. 7. Unless discharge instructions indicate otherwise, you may remove your bandages 24-48 hours after surgery, and you may shower at that time.  You may have steri-strips (small skin tapes) in place directly over the incision.  These strips should be left on the skin for 7-10 days.  If your surgeon used skin glue on the  incision, you may shower in 24 hours.  The glue will flake off over the next 2-3 weeks.  Any sutures or staples will be removed at the office during your follow-up visit. 8. ACTIVITIES:  You may resume regular (light) daily activities beginning the next day--such as daily self-care, walking, climbing stairs--gradually increasing activities as tolerated.  You may have sexual intercourse when it is comfortable.  Refrain from any heavy lifting or straining until approved by your doctor.  a.You may drive when you are no longer taking prescription pain medication, you can comfortably wear a seatbelt, and you can safely maneuver your car and apply brakes. b.RETURN TO WORK:   _____________________________________________  9.You should see your doctor in the office for a follow-up appointment approximately 2-3 weeks after your surgery.  Make sure that you call for this appointment within a day or two after you arrive home to insure a convenient appointment time. 10.OTHER INSTRUCTIONS: _________________________    _____________________________________  WHEN TO CALL YOUR DOCTOR: Fever over 101.0 Inability to urinate Nausea and/or vomiting Extreme swelling or bruising Continued bleeding from incision. Increased pain, redness, or drainage from the incision  The clinic staff is available to answer your questions during regular business hours.  Please don't hesitate to call and ask to speak to one of the nurses for clinical concerns.  If you have a medical emergency, go to the nearest emergency room or call 911.  A surgeon from [facility] is always on call at the hospital   [address], [address],   [place], [place]  [address] ?  [address] [address], [place], [place]   [address] [phone] ? [phone] ? FAX [phone] Web site: [email or web address]  

## 2021-11-02 DIAGNOSIS — K4030 Unilateral inguinal hernia, with obstruction, without gangrene, not specified as recurrent: Secondary | ICD-10-CM | POA: Diagnosis not present

## 2021-11-02 MED ORDER — MELATONIN 3 MG PO TABS
3.0000 mg | ORAL_TABLET | Freq: Every day | ORAL | Status: DC
  Administered 2021-11-02 – 2021-11-03 (×2): 3 mg via ORAL
  Filled 2021-11-02 (×2): qty 1

## 2021-11-02 NOTE — Progress Notes (Signed)
Occupational Therapy Treatment ?Patient Details ?Name: Brenda Alexander ?MRN: [id number] ?DOB: 1933/05/12 ?Today's Date: 11/02/2021 ? ? ?History of present illness 88 y.o. female with medical history significant of DM2, HTN, chronic HFpEF. Presenting after a fall. Dx of rhabdomyolisis. Pt had ED visit 10/05/21 for a fall. ?  ?OT comments ? Treatment focused on advancing fucntional mobility in the setting of self care tasks. Patient in reclined positioned in recliner and needed assistance to lean forward and maintain upright position. Worked on sitting upright and leaning forward in preparation for sit to stand. Patient mod x 2 to stand from recliner and then min x 2 with RW to ambulate to toilet - needing assistance for walker management, steadying and tactile cues to safely turn. Max assist for toileting. Mod x 2 to rise from toilet. Patient fatiguing quickly and leaning over walker on forearms and stalling out with walking. Had recliner pulled closer to the bathroom door and patient repositioned in more upright position. Encouraged feet down for now with family. Patient's HR up 146 with activity but no significant complaints until the end when patient stated yes she was tired.   ? ?Recommendations for follow up therapy are one component of a multi-disciplinary discharge planning process, led by the attending physician.  Recommendations may be updated based on patient status, additional functional criteria and insurance authorization. ?   ?Follow Up Recommendations ? Skilled nursing-short term rehab (<3 hours/day)  ?  ?Assistance Recommended at Discharge Frequent or constant Supervision/Assistance  ?Patient can return home with the following ? Two people to help with walking and/or transfers;A lot of help with bathing/dressing/bathroom;Assistance with cooking/housework;Direct supervision/assist for medications management;Assist for transportation;Help with stairs or ramp for entrance;Direct supervision/assist for financial  management ?  ?Equipment Recommendations ? Other (comment) (Defer to Next Venue)  ?  ?Recommendations for Other Services   ? ?  ?Precautions / Restrictions Precautions ?Precautions: Fall ?Precaution Comments: shakiness in LUE, monitor HR ?Restrictions ?Weight Bearing Restrictions: No  ? ? ?  ? ?Mobility Bed Mobility ?  ?  ?  ?  ?  ?  ?  ?  ?  ? ?Transfers ?  ?  ?  ?  ?  ?  ?  ?  ?  ?  ?  ?  ?Balance Overall balance assessment: Needs assistance ?Sitting-balance support: No upper extremity supported, Feet supported ?Sitting balance-Leahy Scale: Fair ?  ?  ?Standing balance support: Bilateral upper extremity supported, Reliant on assistive device for balance ?Standing balance-Leahy Scale: Poor ?  ?  ?  ?  ?  ?  ?  ?  ?  ?  ?  ?  ?   ? ?ADL either performed or assessed with clinical judgement  ? ?ADL Overall ADL's : Needs assistance/impaired ?  ?  ?  ?  ?  ?  ?  ?  ?  ?  ?  ?  ?Toilet Transfer: +2 for safety/equipment;Regular Toilet;Grab bars;Rolling walker (2 wheels);+2 for physical assistance;Moderate assistance ?Toilet Transfer Details (indicate cue type and reason): Patient able to walk to bathroom with min assist - mostly for walker management but then increased assistance for steadying the closer to the toilet she got. Tactile cues and verbal cues for hand placement with turning to sit on toilet. +2 required for safety. Patient fatigueing and leaning on forearms on walker. Mod x 2 to stand from toilet. ?Toileting- Water quality scientist and Hygiene: Sit to/from stand;Maximal assistance ?Toileting - Clothing Manipulation Details (indicate cue type and reason): Patient attempted  to wipe her self from anterior position and then attempt from posterior - but unable to actually wipe herself thus therapist assisted. ?  ?  ?Functional mobility during ADLs: +2 for safety/equipment;Rolling walker (2 wheels);Moderate assistance;+2 for physical assistance ?General ADL Comments: Patient up in [place]. Mod x 2 for powering up into  standing with hands on walker or use of grab bar. Min assist with +2 assistance for safety for ambulation. ?  ? ?Extremity/Trunk Assessment   ?  ?  ?  ?  ?  ? ?Vision   ?Vision Assessment?: No apparent visual deficits ?  ?Perception   ?  ?Praxis   ?  ? ?Cognition Arousal/Alertness: Awake/alert ?Behavior During Therapy: [facility] for tasks assessed/performed ?Overall Cognitive Status: Within Functional Limits for tasks assessed ?  ?  ?  ?  ?  ?  ?  ?  ?  ?  ?  ?  ?  ?  ?  ?  ?  ?  ?  ?   ?Exercises   ? ?  ?Shoulder Instructions   ? ? ?  ?General Comments    ? ? ?Pertinent Vitals/ Pain       Pain Assessment ?Pain Assessment: No/denies pain ? ?Home Living   ?  ?  ?  ?  ?  ?  ?  ?  ?  ?  ?  ?  ?  ?  ?  ?  ?  ?  ? ?  ?Prior Functioning/Environment    ?  ?  ?  ?   ? ?Frequency ? Min 2X/week  ? ? ? ? ?  ?Progress Toward Goals ? ?OT Goals(current goals can now be found in the care plan section) ? Progress towards OT goals: Progressing toward goals ? ?Acute Rehab OT Goals ?Patient Stated Goal: be more independent ?OT Goal Formulation: With patient ?Time For Goal Achievement: 11/11/21 ?Potential to Achieve Goals: Fair  ?Plan Discharge plan remains appropriate   ? ?Co-evaluation ? ? ?   ?  ?  ?  ?  ? ?  ?AM-PAC OT "6 Clicks" Daily Activity     ?Outcome Measure ? ? Help from another person eating meals?: A Little ?Help from another person taking care of personal grooming?: A Little ?Help from another person toileting, which includes using toliet, bedpan, or urinal?: A Lot ?Help from another person bathing (including washing, rinsing, drying)?: A Lot ?Help from another person to put on and taking off regular upper body clothing?: A Little ?Help from another person to put on and taking off regular lower body clothing?: A Lot ?6 Click Score: 15 ? ?  ?End of Session Equipment Utilized During Treatment: Rolling walker (2 wheels) ? ?OT Visit Diagnosis: Unsteadiness on feet (R26.81);Muscle weakness (generalized) (M62.81);History of falling  (Z91.81);Other abnormalities of gait and mobility (R26.89) ?  ?Activity Tolerance Patient tolerated treatment well ?  ?Patient Left in chair;with call bell/phone within reach;with chair alarm set;with family/visitor present ?  ?Nurse Communication Mobility status ?  ? ?   ? ?Time: 2831-5176 ?OT Time Calculation (min): 17 min ? ?Charges: OT General Charges ?$OT Visit: 1 Visit ?OT Treatments ?$Self Care/Home Management : 8-22 mins ? ?Cathleen Yagi, OTR/L ?Acute Care Rehab Services  ?Office [phone] ?Pager: [phone]  ? ?Davinder Haff L Nyjai Graff ?11/02/2021, 4:04 PM ?

## 2021-11-02 NOTE — Progress Notes (Signed)
Patient seen and examined this morning, discharge did not happen yesterday and authorization was pending.  Daughter at the bedside patient having bowel movement and has no complaints.  Plan unchanged.  she remains a stable for discharge. ?

## 2021-11-03 DIAGNOSIS — I5032 Chronic diastolic (congestive) heart failure: Secondary | ICD-10-CM | POA: Diagnosis not present

## 2021-11-03 DIAGNOSIS — K4030 Unilateral inguinal hernia, with obstruction, without gangrene, not specified as recurrent: Secondary | ICD-10-CM | POA: Diagnosis not present

## 2021-11-03 LAB — GLUCOSE, CAPILLARY: Glucose-Capillary: 106 mg/dL — ABNORMAL HIGH (ref 70–99)

## 2021-11-03 NOTE — Progress Notes (Signed)
?PROGRESS NOTE ?Brenda Alexander  DJS:[id number] DOB: 1932/09/28 DOA: 10/27/2021 ?PCP: [name], NP  ? ?Brief Narrative/Hospital Course: ?67 yof w/ DM2,HTN,chronic HFpEF presented after a fall.She apparently was able to relay her story to the ED- she was trying to transfer from a recliner night PTA and apparently slid to the floor. She was unable to get up on her own. She didn't hit her head. She tried to call her daughter, but her daughter was unable to pick up the phone. Family checked on her in morning and saw that she had been on the floor all night. ?In the ED, labs w: Na+ 132 CK 844 Trp 37 -> 36 WBC  5.2 ?EKG: sinus, no st elevations; prolong QT. CTH/CT cspine: "1. No acute intracranial abnormality. 2. No acute fracture or subluxation of the cervical spine. 3. Advanced facet arthropathy on the left at C2-3 with slight widening of the joint space and erosions suggesting an underlying inflammatory or crystalline arthropathy.4. Moderate chronic microvascular ischemic change and diffuse cerebral volume loss." ?CXR:  1. Low lung volumes with mildly increased bibasilar interstitial markings which could be due to atelectasis or early infiltrate. 2. Mild cardiomegaly.  No evidence of UTI. ?Patient admitted for rhabdomyolysis mild hyponatremia and want this to fall/possible near syncope.  Patient orthostatic vitals were negative.  Echocardiogram showed EF more than 75%, mild concentric LVH with severe proximal septal thickening, no RWMA, normal RV systolic function, left atrium severely dilated aortic valve is tricuspid mitral valve is normal. Patient was found to be deconditioned weak PT OT evaluation recommended skilled nursing facility placement. 4/5-patient complained of left groin pain nausea emesis and found to have left groin mass and imaging showed incarcerated left inguinal hernia, general surgery manually reduced it. General surgery took the patient to the OR, was not reducible, then underwent hernia repair  surgery 4/6.Postop doing well cleared by surgery for discharge to skilled nursing facility.  Waiting for insurance approval ? ?Subjective: ?Seen and examined this morning.  Reports had a bowel movement yesterday and on commode this morning  ?Overnight no fever.  No new complaints ?Bmx1/24 HRS ? ?Assessment and Plan: ?Active Problems: ?  Essential hypertension ?  Type 2 diabetes mellitus ([place]) ?  Chronic heart failure with preserved ejection fraction ([place]) ?  Multiple falls ?  Near syncope ?  HLD (hyperlipidemia) ?  Rhabdomyolysis ?  Incarcerated left inguinal hernia ? ?Incarcerated left inguinal hernia: Patient symptomatic with pain on the left groin nausea emesis, CT abdomen showed left inguinal incarcerated hernia  s/p  OR, hernia was not reducible, then underwent hernia repair surgery 10/31/21-Postop doing well cleared by surgery for discharge to skilled nursing facility. ?  ?Rhabdomyolysis ?Mild hyponatremia: ?Suspect component of dehydration and traumatic mild rhabdomyolysis. cont po hydration. ?  ?Fall at home questionable near syncope: CT head negative for acute finding.  Echocardiogram EF more than 75%, no RWMA, normal mitral valve tricuspid AV.negative orthostatic vitals.  Mostly deconditioned-waiting for placement ?  ?Nonsustained V. tach : Monitoring telemetry.  Electrolytes stable- mag phos ?  ?Subtle/mildly elevated troponin likely demand ischemia troponin 37> 36, EKG nonischemic.  Echocardiogram unremarkable. ?  ?T2DM diet controlled, stable.  ?Recent Labs  ?Lab 10/27/21 ?1200 10/28/21 ?0500 11/03/21 ?8850  ?GLUCAP 96 94 106*  ?  ?Chronic HFpEF: Compensated .  EF is preserved.  Echo reviewed. ?Hyperlipidemia on statin ?Hypertension BP fairly controlled on home Imdur aldactone clonidine.  At times high due to clonidine need ?Donditioning debility continue PT OT and placement ? ?  DVT prophylaxis: enoxaparin (LOVENOX) injection 40 mg Start: 10/27/21 2200 ?Code Status:   Code Status: DNR ?Family  Communication: plan of care discussed with patient/daughter at bedside. ?Patient status is: Observation ?Level of care: Telemetry  ?Patient currently Stable ? ?Dispo: The patient is from: home ?           Anticipated disposition: SNF PENDING APPROVAL ?Met w/ daughter in the hallway ? ?Mobility Assessment (last 72 hours)   ? ? Mobility Assessment   ? ? [place] Name 11/02/21 1602 11/02/21 0845 11/02/21 0840 11/01/21 2031 11/01/21 2030  ? Does patient have an order for bedrest or is patient medically unstable -- No - Continue assessment No - Continue assessment No - Continue assessment No - Continue assessment  ? What is the highest level of mobility based on the progressive mobility assessment? Level 4 (Walks with assist in room) - Balance while marching in place and cannot step forward and back - Complete Level 4 (Walks with assist in room) - Balance while marching in place and cannot step forward and back - Complete Level 4 (Walks with assist in room) - Balance while marching in place and cannot step forward and back - Complete Level 3 (Stands with assist) - Balance while standing  and cannot march in place --  ? Is the above level different from baseline mobility prior to current illness? -- Yes - Recommend PT order Yes - Recommend PT order Yes - Recommend PT order --  ? ? [place] Name 11/01/21 1017 10/31/21 2156 10/31/21 1729  ?  ?  ? Does patient have an order for bedrest or is patient medically unstable No - Continue assessment No - Continue assessment No - Continue assessment    ? What is the highest level of mobility based on the progressive mobility assessment? Level 3 (Stands with assist) - Balance while standing  and cannot march in place Level 3 (Stands with assist) - Balance while standing  and cannot march in place Level 3 (Stands with assist) - Balance while standing  and cannot march in place    ? Is the above level different from baseline mobility prior to current illness? Yes - Recommend PT order Yes -  Recommend PT order --    ? ?  ?  ? ?  ?  ? ?Objective: ?Vitals last 24 hrs: ?Vitals:  ? 11/02/21 1603 11/02/21 1927 11/03/21 0500 11/03/21 0523  ?BP: (!) 120/55 (!) 146/69  (!) 172/98  ?Pulse: (!) 103 88  71  ?Resp: 16 (!) 22  20  ?Temp: 98.9 ?F (37.2 ?C) 98.6 ?F (37 ?C)  98.6 ?F (37 ?C)  ?TempSrc: Oral Oral  Oral  ?SpO2: 100% 100%  100%  ?Weight:   70.6 kg   ?Height:      ? ?Weight change:  ? ?Physical Examination: ?General exam: AA, elderly, frail ?HEENT:Oral mucosa moist, Ear/Nose WNL grossly, dentition normal. ?Respiratory system: bilaterally diminished, no use of accessory muscle ?Cardiovascular system: S1 & S2 +, No JVD,. ?Gastrointestinal system: Abdomen soft, mildly tender surgical site ?Plan on the left groin  ?Nervous System:Alert, awake, moving extremities and grossly nonfocal ?Extremities: LE ankle edema none, distal peripheral pulses palpable.  ?Skin: No rashes,no icterus. ?MSK: Normal muscle bulk,tone, power  ? ?Medications reviewed:  ?Scheduled Meds: ? acetaminophen  1,000 mg Oral Q8H  ? cloNIDine  0.2 mg Oral BID  ? enoxaparin (LOVENOX) injection  40 mg Subcutaneous Q24H  ? isosorbide mononitrate  20 mg Oral Daily  ? melatonin  3 mg Oral QHS  ? pantoprazole  40 mg Oral Daily  ? simvastatin  20 mg Oral QHS  ? sodium chloride flush  3 mL Intravenous Q12H  ? spironolactone  12.5 mg Oral Daily  ? ?Continuous Infusions: ? lactated ringers Stopped (11/01/21 0921)  ? ? ? ?  ?Diet Order   ? ?       ?  Diet regular Room service appropriate? Yes; Fluid consistency: Thin  Diet effective now       ?  ? ?  ?  ? ?  ?  ? ?  ?  ?  ? ? ?Intake/Output Summary (Last 24 hours) at 11/03/2021 1054 ?Last data filed at 11/03/2021 3646 ?Gross per 24 hour  ?Intake --  ?Output 1550 ml  ?Net -1550 ml  ? ?Net IO Since Admission: -3,819.9 mL [11/03/21 1054]  ?Wt Readings from Last 3 Encounters:  ?11/03/21 70.6 kg  ?10/16/21 75.3 kg  ?05/24/21 70.8 kg  ?  ? ?Unresulted Labs (From admission, onward)  ? ? None  ? ?  ?Data Reviewed: I  have personally reviewed following labs and imaging studies ?CBC: ?Recent Labs  ?Lab 16-Nov-2021 ?1202 10/28/21 ?0403 10/31/21 ?8032 11/01/21 ?0424  ?WBC 5.2 4.8 4.0 7.9  ?NEUTROABS 3.9  --   --   --   ?HGB 12.7 10.9* 11.5* 11.

## 2021-11-03 NOTE — Progress Notes (Signed)
Physical Therapy Treatment ?Patient Details ?Name: Brenda Alexander ?MRN: [id number] ?DOB: 19-Feb-1933 ?Today's Date: 11/03/2021 ? ? ?History of Present Illness 88 y.o. female with medical history significant of DM2, HTN, chronic HFpEF. Presenting after a fall. Dx of rhabdomyolisis. Pt with Left incarcerated inguinal hernia repair 10/31/21.  Pt had ED visit 10/05/21 for a fall. ? ?  ?PT Comments  ? ? Pt motivated and excited to be walking.  Pt mod assist to exit bed and up to ambulate 75' twice with RW and with seated rest break between. Pt requesting phone video of her walking to show family.   ?Recommendations for follow up therapy are one component of a multi-disciplinary discharge planning process, led by the attending physician.  Recommendations may be updated based on patient status, additional functional criteria and insurance authorization. ? ?Follow Up Recommendations ? Skilled nursing-short term rehab (<3 hours/day) ?  ?  ?Assistance Recommended at Discharge Frequent or constant Supervision/Assistance  ?Patient can return home with the following A lot of help with walking and/or transfers;A little help with bathing/dressing/bathroom;Assist for transportation;Help with stairs or ramp for entrance;Assistance with cooking/housework ?  ?Equipment Recommendations ? None recommended by PT  ?  ?Recommendations for Other Services   ? ? ?  ?Precautions / Restrictions Precautions ?Precautions: Fall ?Restrictions ?Weight Bearing Restrictions: No  ?  ? ?Mobility ? Bed Mobility ?Overal bed mobility: Needs Assistance ?Bed Mobility: Rolling, Sidelying to Sit, Sit to Sidelying ?Rolling: Min assist ?Sidelying to sit: Mod assist ?Supine to sit: Mod assist ?  ?  ?General bed mobility comments: cues for log roll technique and assist to manage trunk to sitting and to assist legs back onto bed ?  ? ?Transfers ?Overall transfer level: Needs assistance ?  ?Transfers: Sit to/from Stand ?Sit to Stand: Min assist, Mod assist, +2 physical  assistance, +2 safety/equipment ?  ?  ?  ?  ?  ?General transfer comment: cues for transition position and use of UEs to self assist.  Physical assist to bring wt up and fwd and to balance in initial standing ?  ? ?Ambulation/Gait ?Ambulation/Gait assistance: Min assist, Mod assist, +2 safety/equipment ?Gait Distance (Feet): 75 Feet (75' twice with seated rest break) ?Assistive device: Rolling walker (2 wheels) ?Gait Pattern/deviations: Step-to pattern, Step-through pattern, Decreased step length - right, Decreased step length - left, Shuffle, Trunk flexed, Wide base of support ?  ?  ?  ?General Gait Details: Cues for posture and postion from RW;  Physical assist for balance/support and RW management.  Noted improvement in stability with increased distance ambulated ? ? ?Stairs ?  ?  ?  ?  ?  ? ? ?Wheelchair Mobility ?  ? ?Modified Rankin (Stroke Patients Only) ?  ? ? ?  ?Balance Overall balance assessment: Needs assistance ?Sitting-balance support: No upper extremity supported, Feet supported ?Sitting balance-Leahy Scale: Fair ?Sitting balance - Comments: posterior leaning with fatigue ?  ?Standing balance support: Bilateral upper extremity supported, Reliant on assistive device for balance ?Standing balance-Leahy Scale: Poor ?Standing balance comment: relies on BUE support ?  ?  ?  ?  ?  ?  ?  ?  ?  ?  ?  ?  ? ?  ?Cognition Arousal/Alertness: Awake/alert ?Behavior During Therapy: [facility] for tasks assessed/performed ?Overall Cognitive Status: Within Functional Limits for tasks assessed ?  ?  ?  ?  ?  ?  ?  ?  ?  ?  ?  ?  ?  ?  ?  ?  ?  General Comments: patient is plesant and motivated to participate in session ?  ?  ? ?  ?Exercises   ? ?  ?General Comments   ?  ?  ? ?Pertinent Vitals/Pain Pain Assessment ?Pain Assessment: No/denies pain  ? ? ?Home Living   ?  ?  ?  ?  ?  ?  ?  ?  ?  ?   ?  ?Prior Function    ?  ?  ?   ? ?PT Goals (current goals can now be found in the care plan section) Acute Rehab PT Goals ?Patient  Stated Goal: to get stronger at rehab ?PT Goal Formulation: With patient/family ?Time For Goal Achievement: 11/11/21 ?Potential to Achieve Goals: Good ?Progress towards PT goals: Progressing toward goals ? ?  ?Frequency ? ? ? Min 2X/week ? ? ? ?  ?PT Plan Current plan remains appropriate  ? ? ?Co-evaluation   ?  ?  ?  ?  ? ?  ?AM-PAC PT "6 Clicks" Mobility   ?Outcome Measure ? Help needed turning from your back to your side while in a flat bed without using bedrails?: A Little ?Help needed moving from lying on your back to sitting on the side of a flat bed without using bedrails?: A Lot ?Help needed moving to and from a bed to a chair (including a wheelchair)?: A Lot ?Help needed standing up from a chair using your arms (e.g., wheelchair or bedside chair)?: A Lot ?Help needed to walk in hospital room?: A Lot ?Help needed climbing 3-5 steps with a railing? : A Lot ?6 Click Score: 13 ? ?  ?End of Session Equipment Utilized During Treatment: Gait belt ?Activity Tolerance: Patient tolerated treatment well ?Patient left: in bed;with call bell/phone within reach ?Nurse Communication: Mobility status ?PT Visit Diagnosis: Difficulty in walking, not elsewhere classified (R26.2);Repeated falls (R29.6) ?  ? ? ?Time: 1432-1500 ?PT Time Calculation (min) (ACUTE ONLY): 28 min ? ?Charges:  $Gait Training: 23-37 mins          ?          ? ?Debe Coder PT ?Acute Rehabilitation Services ?Pager [phone] ?Office [phone] ? ? ? ?Brenda Alexander ?11/03/2021, 4:00 PM ? ?

## 2021-11-04 DIAGNOSIS — K4030 Unilateral inguinal hernia, with obstruction, without gangrene, not specified as recurrent: Secondary | ICD-10-CM | POA: Diagnosis not present

## 2021-11-04 LAB — GLUCOSE, CAPILLARY
Glucose-Capillary: 96 mg/dL (ref 70–99)
Glucose-Capillary: 94 mg/dL (ref 70–99)

## 2021-11-04 MED ORDER — MELATONIN 3 MG PO TABS: 3.0000 mg | ORAL_TABLET | Freq: Every evening | ORAL | 0 refills | Status: DC | PRN

## 2021-11-04 NOTE — TOC Transition Note (Addendum)
Transition of Care (TOC) - CM/SW Discharge Note ? ? ?Patient Details  ?Name: Brenda Alexander ?MRN: [id number] ?Date of Birth: 08-Nov-1932 ? ?Transition of [facility]) CM/SW Contact:  ?Dessa Phi, RN ?Phone Number: ?11/04/2021, 11:41 AM ? ? ?Clinical Narrative:  d/c [facility]-SNF-rep [name] aware-d/c summary sent-awaiting rm#,tel# for nsg report. [facility] for transport. No further CM needs. ?-1:40p-going to rm#[phone],nsg call report tel#[phone]. [facility] called.  ? ? ? ?Final next level of care: [place] ?Barriers to Discharge: No Barriers Identified ? ? ?Patient Goals and CMS Choice ?Patient states their goals for this hospitalization and ongoing recovery are:: rehab ?CMS Medicare.gov Compare Post Acute Care list provided to:: Patient Represenative (must comment) (dtr nanette [phone]) ?Choice offered to / list presented to : Adult Children ? ?Discharge Placement ?PASRR number recieved: 10/29/21 ?           ?Patient chooses bed at: [place] ?Patient to be transferred to facility by: [facility] ?Name of family member notified: Nanette dtr [phone] ?Patient and family notified of of transfer: 11/04/21 ? ?Discharge Plan and Services ?  ?Discharge Planning Services: CM Consult ?Post Acute Care Choice: [place]          ?  ?  ?  ?  ?  ?  ?  ?  ?  ?  ? ?Social Determinants of Health (SDOH) Interventions ?  ? ? ?Readmission Risk Interventions ?   ? View : No data to display.  ?  ?  ?  ? ? ? ? ? ?

## 2021-11-04 NOTE — Progress Notes (Signed)
Called and gave report to nurse at [facility]. ?

## 2021-11-04 NOTE — Progress Notes (Signed)
Patient seen and examined family at the bedside.  Tolerating diet.  Having bowel movement.  Pain is controlled.  She remains medically stable for discharge to skilled nursing facility.  Completed discharge summary to reflect discharge as 11/04/21. ?Discharge med rec completed. ?

## 2021-11-04 NOTE — Progress Notes (Signed)
4 Days Post-Op  ? ?Subjective/Chief Complaint: ?Denies pain.  ?Eating her breakfast. ?States her last BM was yesterday. ?States she walked in the hall yesterday  ? ? ?Objective: ?Vital signs in last 24 hours: ?Temp:  [97.3 ?F (36.3 ?C)-98.7 ?F (37.1 ?C)] 98.3 ?F (36.8 ?C) (04/10 0423) ?Pulse Rate:  [72-83] 73 (04/10 0423) ?Resp:  [18-20] 18 (04/10 0423) ?BP: (123-173)/(75-90) 173/90 (04/10 0423) ?SpO2:  [100 %] 100 % (04/10 0423) ?Weight:  [71.3 kg] 71.3 kg (04/10 0423) ?Last BM Date : 11/02/21 ? ?Intake/Output from previous day: ?04/09 0701 - 04/10 0700 ?In: 0  ?Out: 2700 [Urine:2700] ?Intake/Output this shift: ?No intake/output data recorded. ? ?Exam: ?Awake and alert ?Looks comfortable ?Abdomen soft, ND, incision healing well, mild ecchymosis  ? ?Lab Results:  ? ? ?Studies/Results: ?No results found. ? ?Anti-infectives: ?Anti-infectives (From admission, onward)  ? ? Start     Dose/Rate Route Frequency Ordered Stop  ? 10/31/21 1200  ciprofloxacin (CIPRO) IVPB 400 mg       ? 400 mg ?200 mL/hr over 60 Minutes Intravenous On call to O.R. 10/31/21 1012 10/31/21 1404  ? ?  ? ? ?Assessment/Plan: ?s/p Procedure(s): ?OPEN REPAIR LEFT INCARCERATED INGUINAL HERNIA (Left) ? ?POD#4 ?Recovering well post-operatively. Vitals stable, pain controlled, tolerating PO, having bowel function. Stable for discharge to rehab/SNF from [facility] perspective. Will need outpatient follow up with Dr. Redmond Pulling in 4 weeks. We will follow peripherally while the patient is in the hospital.  ? ? ?Jill Alexanders PA-C ?11/04/2021 ? ?

## 2021-11-05 ENCOUNTER — Non-Acute Institutional Stay (SKILLED_NURSING_FACILITY): Payer: Medicare HMO | Admitting: Adult Health

## 2021-11-05 ENCOUNTER — Encounter: Payer: Self-pay | Admitting: Adult Health

## 2021-11-05 DIAGNOSIS — K4030 Unilateral inguinal hernia, with obstruction, without gangrene, not specified as recurrent: Secondary | ICD-10-CM | POA: Diagnosis not present

## 2021-11-05 DIAGNOSIS — R5381 Other malaise: Secondary | ICD-10-CM

## 2021-11-05 DIAGNOSIS — I10 Essential (primary) hypertension: Secondary | ICD-10-CM

## 2021-11-05 DIAGNOSIS — I5032 Chronic diastolic (congestive) heart failure: Secondary | ICD-10-CM | POA: Diagnosis not present

## 2021-11-05 DIAGNOSIS — E871 Hypo-osmolality and hyponatremia: Secondary | ICD-10-CM | POA: Diagnosis not present

## 2021-11-05 DIAGNOSIS — T796XXS Traumatic ischemia of muscle, sequela: Secondary | ICD-10-CM

## 2021-11-05 NOTE — Progress Notes (Signed)
? ?Location:  [facility] ?Nursing Home Room Number: [id number] ?Place of Service:  SNF (31) ?Provider:  Durenda Age, DNP, FNP-BC ? ?Patient Care Team: ?[name], NP as PCP - General ? ?Extended Emergency Contact Information ?Primary Emergency Contact: Laday,NANETTE ?         [place], [place] [address] [place] ?Home Phone: [phone] ?Mobile Phone: [phone] ?Relation: Daughter ?Secondary Emergency Contact: Kearse,Dester ?         [place], [place] [address] [place] ?Home Phone: [phone] ?Mobile Phone: [phone] ?Relation: Daughter ? ?Code Status:  DNR ? ?Goals of care: Advanced Directive information ? ?  11/05/2021  ?  8:29 AM  ?Advanced Directives  ?Does Patient Have a Medical Advance Directive? Yes  ?Type of Advance Directive Out of facility DNR (pink MOST or yellow form)  ?Does patient want to make changes to medical advance directive? No - Patient declined  ?Pre-existing out of facility DNR order (yellow form or pink MOST form) Yellow form placed in chart (order not valid for inpatient use)  ? ? ? ?Chief Complaint  ?Patient presents with  ? Hospitalization Follow-up  ?  Follow-up from recent hospital stay   ? ? ?HPI:  ?Pt is a 88 y.o. female who was admitted to [facility] post hospital admission 10/27/21 to 11/04/21.  She has a PMH of diabetes mellitus type 2 and chronic HFpEF. A day PTA, she was trying to transfer from a recliner and apparently slid to the floor. She was unable to to get up. She tried to call her daughter but daughter was unable to pick up the phone. Family checked on her the next day and found her on the floor. In the ED, labs showed Na 132, CK 844, trp 37, wbc 5.2, EKG sinus rhythm, no acute fracture, no acute intracranial abnormality, no acute fracture or subluxation of the cervical spine, advanced facet arthropathy on the left at C2-3 with slight widening of the joint space and erosions suggesting an underlying  inflammatory or crystalline arthropathy, moderate and chest x-ray showed low lung volumes with mildly increased bibasilar Insterstitial markings which could be due to atelectasis or early infiltrate. There was no evidence for UTI. She was admitted to the hospital for rhabdomyolysis, mild hyponatremia and fall/possible syncope. EF more than 75%. On 4/5, she complained of left groin pain, nausea, vomiting and was found to have left groin mass and imaging showed incarcerated left inguinal hernia. General surgery tried manually reduced it but not reducible. She underwent hernia repair surgery on 10/31/21. ? ?She was admitted to [facility] for short-term rehabilitation. ? ? ?Past Medical History:  ?Diagnosis Date  ? Anemia   ? Arthritis   ? knees, hands  ? Coronary artery disease   ? Diabetes mellitus without complication ([place])   ? no meds  ? Dyspnea 01/12/2018  ? w/anemia dx and hospital admit w/blood transfused   ? 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 History of blood transfusion 01/12/2018  ? at [facility]  ? Hyperlipemia   ? Hypertension   ? SVD (spontaneous vaginal delivery)   ? x 6 - 5 living children  ? Uses walker   ? rollator  ? Wears partial dentures   ? upper and lower partials  ? ?Past Surgical History:  ?Procedure Laterality Date  ? COLONOSCOPY    ? EYE SURGERY    ? cataracts removed right eye  ? HYSTEROSCOPY WITH D & C N/A 05/06/2018  ?  Procedure: DILATATION AND CURETTAGE /HYSTEROSCOPY;  Surgeon: [name], MD;  Location: [facility];  Service: Gynecology;  Laterality: N/A;  ? INGUINAL HERNIA REPAIR Left 10/31/2021  ? Procedure: OPEN REPAIR LEFT INCARCERATED INGUINAL HERNIA;  Surgeon: Greer Pickerel, MD;  Location: [name];  Service: General;  Laterality: Left;  ? LAPAROSCOPY N/A 08/26/2018  ? Procedure: LAPAROSCOPY DIAGNOSTIC;  Surgeon: [name], MD;  Location: [place];  Service: General;  Laterality: N/A;  ? MULTIPLE TOOTH EXTRACTIONS    ? ROBOTIC ASSISTED  TOTAL HYSTERECTOMY WITH BILATERAL SALPINGO OOPHERECTOMY Bilateral 05/24/2018  ? Procedure: XI ROBOTIC ASSISTED TOTAL HYSTERECTOMY WITH BILATERAL SALPINGO OOPHORECTOMY;  Surgeon: [name], MD;  Location: [name];  Service: Gynecology;  Laterality: Bilateral;  ? SENTINEL NODE BIOPSY N/A 05/24/2018  ? Procedure: SENTINEL LYMP  NODE BIOPSY;  Surgeon: [name], MD;  Location: [name];  Service: Gynecology;  Laterality: N/A;  ? ? ?Allergies  ?Allergen Reactions  ? Other Anaphylaxis and Swelling  ?  Walnuts = Throat swells  ? Penicillins Swelling and Rash  ?  Has patient had a PCN reaction causing immediate rash, facial/tongue/throat swelling, SOB or lightheadedness with hypotension: Yes ?Has patient had a PCN reaction causing severe rash involving mucus membranes or skin necrosis: Unk ?Has patient had a PCN reaction that required hospitalization: Unk ?Has patient had a PCN reaction occurring within the last 10 years: No ?If all of the above answers are "NO", then may proceed with Cephalosporin use. ?  ? ? ?Outpatient Encounter Medications as of 11/05/2021  ?Medication Sig  ? acetaminophen (TYLENOL) 500 MG tablet Take 1,000 mg by mouth every 6 (six) hours as needed (for pain).   ? Calcium Carb-Cholecalciferol (CALCIUM + D3 PO) Take 1 tablet by mouth daily.  ? cloNIDine (CATAPRES) 0.2 MG tablet Take 0.2 mg by mouth 2 (two) times daily.  ? docusate sodium (COLACE) 100 MG capsule Take 1 capsule (100 mg total) by mouth 2 (two) times daily as needed for mild constipation or moderate constipation.  ? ferrous sulfate 325 (65 FE) MG EC tablet Take 1 tablet (325 mg total) by mouth 2 (two) times daily.  ? isosorbide mononitrate (ISMO,MONOKET) 20 MG tablet Take 20 mg by mouth daily.  ? labetalol (NORMODYNE) 100 MG tablet Take 0.5 tablets (50 mg total) by mouth 2 (two) times daily.  ? melatonin 3 MG TABS tablet Take 1 tablet (3 mg total) by mouth at bedtime as needed.  ? Multiple Vitamins-Minerals (MULTI FOR HER 50+) TABS Take 1  tablet by mouth daily.  ? omeprazole (PRILOSEC) 20 MG capsule 20 mg daily.  ? ondansetron (ZOFRAN) 4 MG tablet Take 4 mg by mouth every 8 (eight) hours as needed for nausea or vomiting.  ? senna (SENOKOT) 8.6 MG TABS tablet Take 1 tablet by mouth daily as needed for mild constipation.  ? simvastatin (ZOCOR) 20 MG tablet Take 20 mg by mouth at bedtime.   ? spironolactone (ALDACTONE) 25 MG tablet Take 0.5 tablets (12.5 mg total) by mouth daily.  ? vitamin C (ASCORBIC ACID) 500 MG tablet Take 500 mg by mouth daily.  ? [DISCONTINUED] furosemide (LASIX) 40 MG tablet Take 40 mg by mouth as needed.  ? ?No facility-administered encounter medications on file as of 11/05/2021.  ? ? ?Review of Systems  ?Constitutional:  Negative for appetite change, chills, fatigue and fever.  ?HENT:  Negative for congestion, hearing loss, rhinorrhea and sore throat.   ?Eyes: Negative.   ?Respiratory:  Negative for cough, shortness of  breath and wheezing.   ?Cardiovascular:  Negative for chest pain, palpitations and leg swelling.  ?Gastrointestinal:  Negative for abdominal pain, constipation, diarrhea, nausea and vomiting.  ?Genitourinary:  Negative for dysuria.  ?Musculoskeletal:  Negative for arthralgias, back pain and myalgias.  ?Skin:  Positive for wound. Negative for color change and rash.  ?Neurological:  Negative for dizziness, weakness and headaches.  ?Psychiatric/Behavioral:  Negative for behavioral problems. The patient is not nervous/anxious.    ? ? ? ?Immunization History  ?Administered Date(s) Administered  ? Influenza,inj,Quad PF,6+ Mos 04/07/2018  ? Tdap 04/07/2018  ? ?Pertinent  Health Maintenance Due  ?Topic Date Due  ? OPHTHALMOLOGY EXAM  Never done  ? HEMOGLOBIN A1C  11/18/2018  ? INFLUENZA VACCINE  02/25/2022  ? FOOT EXAM  09/11/2022  ? DEXA SCAN  Completed  ? ? ?  11/02/2021  ?  8:41 AM 11/02/2021  ?  8:00 PM 11/03/2021  ? 10:30 AM 11/03/2021  ?  8:00 PM 11/04/2021  ?  8:32 AM  ?Fall Risk  ?Patient Fall Risk Level Moderate fall risk  Moderate fall risk Moderate fall risk Moderate fall risk High fall risk  ? ? ? ?Vitals:  ? 11/05/21 0825  ?BP: 113/68  ?Pulse: 93  ?Resp: 18  ?Temp: (!) 97.4 ?F (36.3 ?C)  ?Weight: 153 lb 9.6 oz (69.7 kg)  ?Hei

## 2021-11-06 ENCOUNTER — Non-Acute Institutional Stay (SKILLED_NURSING_FACILITY): Payer: Medicare HMO | Admitting: Internal Medicine

## 2021-11-06 DIAGNOSIS — Z87898 Personal history of other specified conditions: Secondary | ICD-10-CM

## 2021-11-06 DIAGNOSIS — T796XXS Traumatic ischemia of muscle, sequela: Secondary | ICD-10-CM

## 2021-11-06 DIAGNOSIS — R9431 Abnormal electrocardiogram [ECG] [EKG]: Secondary | ICD-10-CM

## 2021-11-06 DIAGNOSIS — K4030 Unilateral inguinal hernia, with obstruction, without gangrene, not specified as recurrent: Secondary | ICD-10-CM

## 2021-11-06 DIAGNOSIS — R5381 Other malaise: Secondary | ICD-10-CM

## 2021-11-06 DIAGNOSIS — N179 Acute kidney failure, unspecified: Secondary | ICD-10-CM

## 2021-11-06 NOTE — Progress Notes (Signed)
? ?NURSING HOME LOCATION:  [facility]ren[facility] ?ROOM NUMBER:  [id number] A ? ?CODE STATUS:  DNR ? ?PCP: [name] NP ? ?This is a comprehensive admission note to this SNFperformed on this date less than 30 days from date of admission. ?Included are preadmission medical/surgical history; reconciled medication list; family history; social history and comprehensive review of systems.  ?Corrections and additions to the records were documented. Comprehensive physical exam was also performed. Additionally a clinical summary was entered for each active diagnosis pertinent to this admission in the Problem List to enhance continuity of care. ? ?HPI: Patient was hospitalized 4/2 - 11/04/2021 after falling.  She reported that she had slipped transferring from a recliner and slid to the floor.  She denies any specific cardiac or neurologic prodrome but did state that she noted some shaking of her extremities prior to the fall.  There was no definite seizure activity.  She stated that she was on the floor for approximately 7 hours.  She did not use her medical alert alarm as she "did not want to go to the hospital".  Apparently her daughter found her in the morning and arranged for the transfer to the ED. ?In the ED labs revealed a sodium of 132, CK of 844 and troponin 37.  EKG revealed no STEMI or non-STEMI but did demonstrate a prolonged QT.   ?Cervical spine imaging revealed no fracture but did demonstrate advanced facet arthropathy on the left at C2-3 with slight widening of the joint space and erosions suggesting underlying inflammation or crystalline arthropathy.   ?CNS imaging revealed moderate chronic microvascular ischemic changes and diffuse cerebral volume loss.   ?Bibasilar interstitial markings were increased on CXR ,but suboptimal inspiration was present.  Mild cardiomegaly was noted.   ?Echo revealed EF of greater than 75% with mild concentric LVH with severe proximal septal thickening.  Left  atrium was severely dilated. ?Course was complicated by nonsustained V. tach.  Troponin elevation was felt to be related to demand ischemia. ?On 4/5 the patient complained of left groin pain associated with nausea and emesis.  Imaging revealed incarcerated left inguinal hernia.  General surgery initially attempted to manually reduce it.  She was taken to the ER and hernia repair surgery  with mesh was completed 4/6 by Dr Greer Pickerel.  Blood pressure did rise perioperatively as she did not receive clonidine preop.  Blood pressure improved after reinitiation of the clonidine. ?On 4/2 H/H was 12.7/38.9; on 4/7 H/H was 11.9/36.6.  No bleeding dyscrasias were reported.  Admission creatinine was 0.89 with a GFR greater than 60 indicating stage II CKD.  On 4/7 creatinine was 1.07 with a GFR of 51 indicating CKD stage IIIa.  Sodium did rise to 134 prior to discharge.Total protein was 6 & albumin 3.4. ?Because of deconditioning and the rhabdo; she was discharged to the SNF for rehab. ? ?Past medical and surgical history: includes history of anemia, CAD, diabetes with CKD, history of endometrial adenocarcinoma of the uterus, dyslipidemia, and essential hypertension. ?Surgeries & procedures include colonoscopy, hysteroscopy with D&C, and TAH/BSO. ? ?Social history: Nondrinker and non-smoker. ? ?Family history: Non contributory due to advanced age ?  ?Review of systems:  ?She initially could not recall the term hernia and instead stated "required removal of a tumor".  She states that she had been having pain in the area of the inguinal hernia for several months.  She does validate generalized weakness.  She believes that she has lost approximately  50 pounds but cannot tell me over what period of time. ?She has also noted decreased range of motion of the right upper extremity with elevation.  She believes her nails have been "lighter" recently.  She tends to use phrases such as " a good while "or "for a while " rather than  quantitating the time span. ?She does validate some depression. ? ?Constitutional: No fever ?Eyes: No redness, discharge, pain, vision change ?ENT/mouth: No nasal congestion, purulent discharge, earache, change in hearing, sore throat  ?Cardiovascular: No chest pain, palpitations, paroxysmal nocturnal dyspnea, claudication, edema  ?Respiratory: No cough, sputum production, hemoptysis, DOE, significant snoring, apnea Gastrointestinal: No heartburn, dysphagia, nausea /vomiting, rectal bleeding, melena, change in bowels ?Genitourinary: No dysuria, hematuria, pyuria, incontinence, nocturia ?Dermatologic: No rash, pruritus, change in appearance of skin ?Neurologic: No dizziness, headache, syncope, seizures, numbness, tingling ?Psychiatric: No significant insomnia, anorexia ?Endocrine: No change in hair/skin, excessive thirst, excessive hunger, excessive urination  ?Hematologic/lymphatic: No significant bruising, lymphadenopathy, abnormal bleeding ?Allergy/immunology: No itchy/watery eyes, significant sneezing, urticaria, angioedema ? ?Physical exam:  ?Pertinent or positive findings: She has bilateral ptosis.  There is some exotropia of the right eye.  She does appear to have some difficulty with word retrieval.  She has complete dentures.  There is a grade 7-3.7 systolic murmur at the base.  Abdomen is protuberant.  She has trace-1/2+ edema at the sock line.  Pedal pulses are decreased.  There is slight clubbing the nailbeds.  Reflexes are 1/2+.  There is a resting tremor of her hands.  Knees are fusiformly enlarged. ? ?General appearance: no acute distress, increased work of breathing is present.   ?Lymphatic: No lymphadenopathy about the head, neck, axilla. ?Eyes: No conjunctival inflammation or lid edema is present. There is no scleral icterus. ?Ears:  External ear exam shows no significant lesions or deformities.   ?Nose:  External nasal examination shows no deformity or inflammation. Nasal mucosa are pink and moist  without lesions, exudates ?Oral exam: There is no oropharyngeal erythema or exudate. ?Neck:  No thyromegaly, masses, tenderness noted.    ?Heart:  Normal rate and regular rhythm. S1 and S2 normal without gallop,click, rub.  ?Lungs: Chest clear to auscultation without wheezes, rhonchi, rales, rubs. ?Abdomen: Bowel sounds are normal.  Abdomen is soft and nontender with no organomegaly, hernias, masses. ?GU: Deferred  ?Extremities:  No cyanosis ?Neurologic exam:  Balance, Rhomberg, finger to nose testing could not be completed due to clinical state ?Skin: Warm & dry w/o tenting. ?No significant lesions or rash. ? ?See clinical summary under each active problem in the Problem List with associated updated therapeutic plan ? ?

## 2021-11-07 ENCOUNTER — Encounter: Payer: Self-pay | Admitting: Adult Health

## 2021-11-07 ENCOUNTER — Non-Acute Institutional Stay (SKILLED_NURSING_FACILITY): Payer: Medicare HMO | Admitting: Adult Health

## 2021-11-07 ENCOUNTER — Encounter: Payer: Self-pay | Admitting: Internal Medicine

## 2021-11-07 DIAGNOSIS — D62 Acute posthemorrhagic anemia: Secondary | ICD-10-CM

## 2021-11-07 DIAGNOSIS — I10 Essential (primary) hypertension: Secondary | ICD-10-CM | POA: Diagnosis not present

## 2021-11-07 DIAGNOSIS — Z87898 Personal history of other specified conditions: Secondary | ICD-10-CM | POA: Insufficient documentation

## 2021-11-07 DIAGNOSIS — R9431 Abnormal electrocardiogram [ECG] [EKG]: Secondary | ICD-10-CM | POA: Insufficient documentation

## 2021-11-07 DIAGNOSIS — R251 Tremor, unspecified: Secondary | ICD-10-CM

## 2021-11-07 NOTE — Patient Instructions (Signed)
See assessment and plan under each diagnosis in the problem list and acutely for this visit 

## 2021-11-07 NOTE — Assessment & Plan Note (Signed)
Full TFTs ?

## 2021-11-07 NOTE — Progress Notes (Signed)
? ?Location:  [facility] ?Nursing Home Room Number: 681E ?Place of Service:  SNF (31) ?Provider:  Durenda Age, DNP, FNP-BC ? ?Patient Care Team: ?[name], NP as PCP - General ? ?Extended Emergency Contact Information ?Primary Emergency Contact: Rachels,NANETTE ?         [place], [place] [address] [place] ?Home Phone: [phone] ?Mobile Phone: [phone] ?Relation: Daughter ?Secondary Emergency Contact: Kearse,Dester ?         [place], [place] [address] [place] ?Home Phone: [phone] ?Mobile Phone: [phone] ?Relation: Daughter ? ?Code Status:  DNR ? ?Goals of care: Advanced Directive information ? ?  11/07/2021  ? 10:42 AM  ?Advanced Directives  ?Does Patient Have a Medical Advance Directive? Yes  ?Type of Advance Directive Out of facility DNR (pink MOST or yellow form)  ?Does patient want to make changes to medical advance directive? No - Patient declined  ?Pre-existing out of facility DNR order (yellow form or pink MOST form) Yellow form placed in chart (order not valid for inpatient use)  ? ? ? ?Chief Complaint  ?Patient presents with  ? Acute Visit  ?  Tremors  ? ? ?HPI:  ?Pt is a 88 y.o. female seen today for an acute visit regarding tremors. Daughter was at bedside and stated that patient all of a sudden started shaking her legs then closed her eyes. Daughter called staff immediately. Staff reported that patient was awake and verbally responsive when they saw her. Daughter thought patient had seizure. She has no seizure history. CT head without contrast done on 10/27/21 showed no acute intracranial abnormality and moderate chronic microvascular ischemic change and diffuse cerebral volume loss. Stat Labs showed: hgb 9.2, dropped from 11.9, K 4.3, creatinine 0.86 Na 134, GFR 60.99, improved from 51).  11/07/11 labs: Tsh 1.19, FT3 3.0 and FT4 1.19. Chest x-ray showed no acute cardiopulmonary disease. No SOB nor reported fever. SBPs ranging from 113 to 128, with  outlier 100. She takes clonidine 0.2 mg 1 tab twice a day and labetalol 50 mg BID for hypertension. ? ?  ? ? ?Past Medical History:  ?Diagnosis Date  ? Anemia   ? Arthritis   ? knees, hands  ? Coronary artery disease   ? Diabetes mellitus without complication ([place])   ? no meds  ? Dyspnea 01/12/2018  ? w/anemia dx and hospital admit w/blood transfused   ? 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 History of blood transfusion 01/12/2018  ? at [facility]  ? Hyperlipemia   ? Hypertension   ? SVD (spontaneous vaginal delivery)   ? x 6 - 5 living children  ? Uses walker   ? rollator  ? Wears partial dentures   ? upper and lower partials  ? ?Past Surgical History:  ?Procedure Laterality Date  ? COLONOSCOPY    ? EYE SURGERY    ? cataracts removed right eye  ? HYSTEROSCOPY WITH D & C N/A 05/06/2018  ? Procedure: DILATATION AND CURETTAGE /HYSTEROSCOPY;  Surgeon: [name], MD;  Location: [facility];  Service: Gynecology;  Laterality: N/A;  ? INGUINAL HERNIA REPAIR Left 10/31/2021  ? Procedure: OPEN REPAIR LEFT INCARCERATED INGUINAL HERNIA;  Surgeon: Greer Pickerel, MD;  Location: [name];  Service: General;  Laterality: Left;  ? LAPAROSCOPY N/A 08/26/2018  ? Procedure: LAPAROSCOPY DIAGNOSTIC;  Surgeon: [name], MD;  Location: [facility];  Service: General;  Laterality: N/A;  ? MULTIPLE TOOTH EXTRACTIONS    ? ROBOTIC ASSISTED TOTAL HYSTERECTOMY WITH  BILATERAL SALPINGO OOPHERECTOMY Bilateral 05/24/2018  ? Procedure: XI ROBOTIC ASSISTED TOTAL HYSTERECTOMY WITH BILATERAL SALPINGO OOPHORECTOMY;  Surgeon: [name], MD;  Location: [name];  Service: Gynecology;  Laterality: Bilateral;  ? SENTINEL NODE BIOPSY N/A 05/24/2018  ? Procedure: SENTINEL LYMP  NODE BIOPSY;  Surgeon: [name], MD;  Location: [name];  Service: Gynecology;  Laterality: N/A;  ? ? ?Allergies  ?Allergen Reactions  ? Other Anaphylaxis and Swelling  ?  Walnuts = Throat swells  ? Penicillins Swelling and Rash  ?  Has patient had a PCN  reaction causing immediate rash, facial/tongue/throat swelling, SOB or lightheadedness with hypotension: Yes ?Has patient had a PCN reaction causing severe rash involving mucus membranes or skin necrosis: Unk ?Has patient had a PCN reaction that required hospitalization: Unk ?Has patient had a PCN reaction occurring within the last 10 years: No ?If all of the above answers are "NO", then may proceed with Cephalosporin use. ?  ? ? ?Outpatient Encounter Medications as of 11/07/2021  ?Medication Sig  ? acetaminophen (TYLENOL) 500 MG tablet Take 1,000 mg by mouth every 6 (six) hours as needed (for pain).   ? Calcium Carb-Cholecalciferol (CALCIUM + D3 PO) Take 1 tablet by mouth daily.  ? cloNIDine (CATAPRES) 0.2 MG tablet Take 0.2 mg by mouth 2 (two) times daily.  ? docusate sodium (COLACE) 100 MG capsule Take 1 capsule (100 mg total) by mouth 2 (two) times daily as needed for mild constipation or moderate constipation.  ? ferrous sulfate 325 (65 FE) MG EC tablet Take 1 tablet (325 mg total) by mouth 2 (two) times daily.  ? isosorbide mononitrate (ISMO,MONOKET) 20 MG tablet Take 20 mg by mouth daily.  ? labetalol (NORMODYNE) 100 MG tablet Take 0.5 tablets (50 mg total) by mouth 2 (two) times daily.  ? melatonin 3 MG TABS tablet Take 1 tablet (3 mg total) by mouth at bedtime as needed.  ? Multiple Vitamins-Minerals (MULTI FOR HER 50+) TABS Take 1 tablet by mouth daily.  ? Nutritional Supplements (ENSURE ORIGINAL) LIQD Take 1 Bottle by mouth at bedtime.  ? omeprazole (PRILOSEC) 20 MG capsule 20 mg daily.  ? ondansetron (ZOFRAN) 4 MG tablet Take 4 mg by mouth every 8 (eight) hours as needed for nausea or vomiting.  ? senna (SENOKOT) 8.6 MG TABS tablet Take 1 tablet by mouth daily as needed for mild constipation.  ? simvastatin (ZOCOR) 20 MG tablet Take 20 mg by mouth at bedtime.   ? spironolactone (ALDACTONE) 25 MG tablet Take 0.5 tablets (12.5 mg total) by mouth daily.  ? vitamin C (ASCORBIC ACID) 500 MG tablet Take 500 mg  by mouth daily.  ? [DISCONTINUED] furosemide (LASIX) 40 MG tablet Take 40 mg by mouth as needed.  ? ?No facility-administered encounter medications on file as of 11/07/2021.  ? ? ?Review of Systems  ?Constitutional:  Negative for appetite change, chills, fatigue and fever.  ?HENT:  Negative for congestion, hearing loss, rhinorrhea and sore throat.   ?Eyes: Negative.   ?Respiratory:  Negative for cough, shortness of breath and wheezing.   ?Cardiovascular:  Negative for chest pain, palpitations and leg swelling.  ?Gastrointestinal:  Negative for abdominal pain, constipation, diarrhea, nausea and vomiting.  ?Genitourinary:  Negative for dysuria.  ?Musculoskeletal:  Negative for arthralgias, back pain and myalgias.  ?Skin:  Negative for color change, rash and wound.  ?Neurological:  Positive for tremors. Negative for dizziness, weakness and headaches.  ?Psychiatric/Behavioral:  Negative for behavioral problems. The patient is not nervous/anxious.    ? ? ? ?  Immunization History  ?Administered Date(s) Administered  ? Influenza,inj,Quad PF,6+ Mos 04/07/2018  ? Tdap 04/07/2018  ? ?Pertinent  Health Maintenance Due  ?Topic Date Due  ? OPHTHALMOLOGY EXAM  Never done  ? HEMOGLOBIN A1C  11/18/2018  ? INFLUENZA VACCINE  02/25/2022  ? FOOT EXAM  09/11/2022  ? DEXA SCAN  Completed  ? ? ?  11/02/2021  ?  8:41 AM 11/02/2021  ?  8:00 PM 11/03/2021  ? 10:30 AM 11/03/2021  ?  8:00 PM 11/04/2021  ?  8:32 AM  ?Fall Risk  ?Patient Fall Risk Level Moderate fall risk Moderate fall risk Moderate fall risk Moderate fall risk High fall risk  ? ? ? ?Vitals:  ? 11/07/21 1039  ?BP: 128/67  ?Pulse: 91  ?Resp: 18  ?Temp: (!) 97.5 ?F (36.4 ?C)  ?Weight: 153 lb 9.6 oz (69.7 kg)  ?Height: '5\' 2"'$  (1.575 m)  ? ?Body mass index is 28.09 kg/m?. ? ?Physical Exam ?Constitutional:   ?   General: She is not in acute distress. ?   Appearance: Normal appearance.  ?HENT:  ?   Head: Normocephalic and atraumatic.  ?   Nose: Nose normal.  ?   Mouth/Throat:  ?   Mouth: Mucous  membranes are moist.  ?Eyes:  ?   Conjunctiva/sclera: Conjunctivae normal.  ?Cardiovascular:  ?   Rate and Rhythm: Normal rate and regular rhythm.  ?Pulmonary:  ?   Effort: Pulmonary effort is normal.  ?   [employer]

## 2021-11-07 NOTE — Assessment & Plan Note (Signed)
Asymptomatic @ present. Wound Care Nurse to monitor op site. ?

## 2021-11-07 NOTE — Assessment & Plan Note (Signed)
CKD Stage 3 a present @ discharge with creatinine of 1.07 & GFR of 51. No need to adjust meds unless CKD progresses. ?

## 2021-11-07 NOTE — Assessment & Plan Note (Signed)
Rhabdomyolysis resolving. No need to hold statin. ?

## 2021-11-07 NOTE — Assessment & Plan Note (Signed)
Borderline malnutrition as total protein 6 & albumin 3.4. ?Nutrition consult & PT/OT @ SNF ?

## 2021-11-07 NOTE — Assessment & Plan Note (Addendum)
Med List reviewed, only Zofran possible risk with prolonged QT ?

## 2021-11-12 ENCOUNTER — Emergency Department (HOSPITAL_COMMUNITY)
Admission: EM | Admit: 2021-11-12 | Discharge: 2021-11-12 | Disposition: A | Discharge status: Skilled Nursing Facility | Payer: Medicare HMO | Attending: Emergency Medicine | Admitting: Emergency Medicine

## 2021-11-12 ENCOUNTER — Other Ambulatory Visit: Payer: Self-pay

## 2021-11-12 ENCOUNTER — Encounter: Payer: Self-pay | Admitting: Adult Health

## 2021-11-12 ENCOUNTER — Emergency Department (HOSPITAL_COMMUNITY): Payer: Medicare HMO

## 2021-11-12 ENCOUNTER — Encounter (HOSPITAL_COMMUNITY): Payer: Self-pay

## 2021-11-12 DIAGNOSIS — R400 Somnolence: Secondary | ICD-10-CM | POA: Insufficient documentation

## 2021-11-12 DIAGNOSIS — I959 Hypotension, unspecified: Secondary | ICD-10-CM | POA: Diagnosis not present

## 2021-11-12 DIAGNOSIS — Z7984 Long term (current) use of oral hypoglycemic drugs: Secondary | ICD-10-CM | POA: Diagnosis not present

## 2021-11-12 DIAGNOSIS — D649 Anemia, unspecified: Secondary | ICD-10-CM | POA: Diagnosis not present

## 2021-11-12 DIAGNOSIS — Z79899 Other long term (current) drug therapy: Secondary | ICD-10-CM | POA: Diagnosis not present

## 2021-11-12 DIAGNOSIS — I10 Essential (primary) hypertension: Secondary | ICD-10-CM | POA: Insufficient documentation

## 2021-11-12 DIAGNOSIS — E119 Type 2 diabetes mellitus without complications: Secondary | ICD-10-CM | POA: Insufficient documentation

## 2021-11-12 DIAGNOSIS — R251 Tremor, unspecified: Secondary | ICD-10-CM | POA: Insufficient documentation

## 2021-11-12 DIAGNOSIS — R55 Syncope and collapse: Secondary | ICD-10-CM | POA: Diagnosis present

## 2021-11-12 LAB — URINALYSIS, ROUTINE W REFLEX MICROSCOPIC
Bilirubin Urine: NEGATIVE
Glucose, UA: NEGATIVE mg/dL
Hgb urine dipstick: NEGATIVE
Ketones, ur: NEGATIVE mg/dL
Leukocytes,Ua: NEGATIVE
Nitrite: NEGATIVE
Protein, ur: NEGATIVE mg/dL
Specific Gravity, Urine: 1.015 (ref 1.005–1.030)
pH: 6 (ref 5.0–8.0)

## 2021-11-12 LAB — CBC WITH DIFFERENTIAL/PLATELET
Abs Immature Granulocytes: 0.01 10*3/uL (ref 0.00–0.07)
Basophils Absolute: 0 10*3/uL (ref 0.0–0.1)
Basophils Relative: 1 %
Eosinophils Absolute: 0.1 10*3/uL (ref 0.0–0.5)
Eosinophils Relative: 2 %
HCT: 28.6 % — ABNORMAL LOW (ref 36.0–46.0)
Hemoglobin: 8.9 g/dL — ABNORMAL LOW (ref 12.0–15.0)
Immature Granulocytes: 0 %
Lymphocytes Relative: 10 %
Lymphs Abs: 0.4 10*3/uL — ABNORMAL LOW (ref 0.7–4.0)
MCH: 28.1 pg (ref 26.0–34.0)
MCHC: 31.1 g/dL (ref 30.0–36.0)
MCV: 90.2 fL (ref 80.0–100.0)
Monocytes Absolute: 0.4 10*3/uL (ref 0.1–1.0)
Monocytes Relative: 11 %
Neutro Abs: 3.2 10*3/uL (ref 1.7–7.7)
Neutrophils Relative %: 76 %
Platelets: 258 10*3/uL (ref 150–400)
RBC: 3.17 MIL/uL — ABNORMAL LOW (ref 3.87–5.11)
RDW: 14.5 % (ref 11.5–15.5)
WBC: 4.1 10*3/uL (ref 4.0–10.5)
nRBC: 0 % (ref 0.0–0.2)

## 2021-11-12 LAB — COMPREHENSIVE METABOLIC PANEL
ALT: 14 U/L (ref 0–44)
AST: 28 U/L (ref 15–41)
Albumin: 3.4 g/dL — ABNORMAL LOW (ref 3.5–5.0)
Alkaline Phosphatase: 66 U/L (ref 38–126)
Anion gap: 6 (ref 5–15)
BUN: 16 mg/dL (ref 8–23)
CO2: 25 mmol/L (ref 22–32)
Calcium: 8.3 mg/dL — ABNORMAL LOW (ref 8.9–10.3)
Chloride: 107 mmol/L (ref 98–111)
Creatinine, Ser: 1.06 mg/dL — ABNORMAL HIGH (ref 0.44–1.00)
GFR, Estimated: 51 mL/min — ABNORMAL LOW (ref >60)
Glucose, Bld: 121 mg/dL — ABNORMAL HIGH (ref 70–99)
Potassium: 3.6 mmol/L (ref 3.5–5.1)
Sodium: 138 mmol/L (ref 135–145)
Total Bilirubin: 0.4 mg/dL (ref 0.3–1.2)
Total Protein: 6.2 g/dL — ABNORMAL LOW (ref 6.5–8.1)

## 2021-11-12 LAB — POC OCCULT BLOOD, ED: Fecal Occult Bld: NEGATIVE

## 2021-11-12 LAB — TYPE AND SCREEN
ABO/RH(D): O POS
Antibody Screen: NEGATIVE

## 2021-11-12 MED ORDER — LACTATED RINGERS IV BOLUS
500.0000 mL | Freq: Once | INTRAVENOUS | Status: AC
  Administered 2021-11-12: 500 mL via INTRAVENOUS

## 2021-11-12 NOTE — ED Notes (Signed)
[facility] contacted and transport arranged back to [facility]. Pt 12th on the list. ?

## 2021-11-12 NOTE — ED Notes (Signed)
Pt provided perineal care, clean brief applied, pt repositioned to comfort. Purewick applied. ?

## 2021-11-12 NOTE — ED Triage Notes (Signed)
Patient come to ed with c/o syncope.per facility patient was given BP medication at 1030  and at 1130 ems was call for syncope episode and low BP  Patient BP was 52/38 per ems upon arrival to the facility. Last BP 124/74 CBG 161, given 100 NS IV 18 L AC. ?

## 2021-11-12 NOTE — ED Provider Notes (Signed)
?[facility] DEPT ?Provider Note ? ? ?CSN: [id number] ?Arrival date & time: 11/12/21  1228 ? ?  ? ?History ? ?No chief complaint on file. ? ? ?Brenda Alexander is a 88 y.o. female. ? ?HPI ?88 year old female presents with syncope/near syncope.  History is initially from EMS who brought in patient.  They report the patient is at [facility] for rehabilitation and she was given 12.5 mg Aldactone for elevated blood pressures in the 180s.  After that patient was found to have blood pressures in the 70s and was lethargic.  EMS noted the patient was asleep on their first arrival but would wake up to gentle sternal rub.  When they first got her blood pressure in the ambulance it was in the 50s.  They laid her flat and gave her 100 cc IV fluid and her blood pressure came up.  She states she feels generally tired.  She was recently admitted and had surgery for an incarcerated inguinal hernia.  She feels like she has some residual soreness but no abdominal pain.  She denies current headache or chest pain or shortness of breath. ? ?I spoke to the patient's nurse at her facility, [facility].  At 1030-ish a.m., patient had a blood pressure of 197/108 on the morning check.  Nurse practitioner ordered an extra dose of 12.5 mg Aldactone.  At 11:30 AM the patient was having some tremors and was talking but then became sweaty and unresponsive and snoring.  Blood pressure was 86/52 and she was out for 2 or 3 minutes.  Now she is back to responding normally.  Since 4/14 the patient has had confusion and hallucinations intermittently.  They checked a urine but the sample was bad and so they are not sure if she has a UTI.   ? ?Son arrived later and reports that she has reportedly been intermittently confused but right now he feels like she is normal.   ?Home Medications ?Prior to Admission medications   ?Medication Sig Start Date End Date Taking? Authorizing Provider  ?acetaminophen (TYLENOL) 500 MG tablet Take 1,000 mg  by mouth every 6 (six) hours as needed (for pain).    Yes [provider]  ?cloNIDine (CATAPRES) 0.2 MG tablet Take 0.2 mg by mouth 2 (two) times daily. 08/06/21  Yes [provider]  ?docusate sodium (COLACE) 100 MG capsule Take 1 capsule (100 mg total) by mouth 2 (two) times daily as needed for mild constipation or moderate constipation. ?Patient taking differently: Take 100 mg by mouth 2 (two) times daily as needed for moderate constipation. 05/06/18  Yes Anyanwu, Sallyanne Havers, MD  ?ferrous sulfate 325 (65 FE) MG EC tablet Take 1 tablet (325 mg total) by mouth 2 (two) times daily. 01/13/18 07/28/48 Yes Elgergawy, Silver Huguenin, MD  ?isosorbide mononitrate (ISMO,MONOKET) 20 MG tablet Take 20 mg by mouth daily. 12/23/17  Yes [provider]  ?labetalol (NORMODYNE) 100 MG tablet Take 0.5 tablets (50 mg total) by mouth 2 (two) times daily. 11/01/21  Yes Antonieta Pert, MD  ?melatonin 3 MG TABS tablet Take 1 tablet (3 mg total) by mouth at bedtime as needed. ?Patient taking differently: Take 3 mg by mouth at bedtime as needed (sleep). 11/04/21  Yes Antonieta Pert, MD  ?senna (SENOKOT) 8.6 MG TABS tablet Take 8.6 mg by mouth daily as needed for mild constipation.   Yes [provider]  ?spironolactone (ALDACTONE) 25 MG tablet Take 0.5 tablets (12.5 mg total) by mouth daily. ?Patient not taking: Reported on  11/12/2021 10/16/21   Patwardhan, Reynold Bowen, MD  ?furosemide (LASIX) 40 MG tablet Take 40 mg by mouth as needed.  08/06/20  [provider]  ?   ? ?Allergies    ?Other and Penicillins   ? ?Review of Systems   ?Review of Systems  ?Constitutional:  Positive for fatigue.  ?Respiratory:  Negative for shortness of breath.   ?Cardiovascular:  Negative for chest pain.  ?Gastrointestinal:  Negative for abdominal pain.  ?Genitourinary:  Negative for dysuria.  ?Neurological:  Negative for headaches.  ? ?Physical Exam ?Updated Vital Signs ?BP (!) 164/85   Pulse 82   Temp (!) 97.3 ?F (36.3 ?C) (Oral)   Resp 19    LMP  (LMP Unknown)   SpO2 99%  ?Physical Exam ?Vitals and nursing note reviewed.  ?Constitutional:   ?   Appearance: She is well-developed.  ?HENT:  ?   Head: Normocephalic and atraumatic.  ?Cardiovascular:  ?   Rate and Rhythm: Normal rate and regular rhythm.  ?   Heart sounds: Murmur heard.  ?Pulmonary:  ?   Effort: Pulmonary effort is normal.  ?   Breath sounds: Normal breath sounds.  ?Abdominal:  ?   Palpations: Abdomen is soft.  ?   Tenderness: There is no abdominal tenderness.  ?Skin: ?   General: Skin is warm and dry.  ?Neurological:  ?   Mental Status: She is alert.  ?   Comments: Equal strength in all 4 extremities  ? ? ?ED Results / Procedures / Treatments   ?Labs ?(all labs ordered are listed, but only abnormal results are displayed) ?Labs Reviewed  ?COMPREHENSIVE METABOLIC PANEL - Abnormal; Notable for the following components:  ?    Result Value  ? Glucose, Bld 121 (*)   ? Creatinine, Ser 1.06 (*)   ? Calcium 8.3 (*)   ? Total Protein 6.2 (*)   ? Albumin 3.4 (*)   ? GFR, Estimated 51 (*)   ? All other components within normal limits  ?CBC WITH DIFFERENTIAL/PLATELET - Abnormal; Notable for the following components:  ? RBC 3.17 (*)   ? Hemoglobin 8.9 (*)   ? HCT 28.6 (*)   ? Lymphs Abs 0.4 (*)   ? All other components within normal limits  ?URINALYSIS, ROUTINE W REFLEX MICROSCOPIC - Abnormal; Notable for the following components:  ? Color, Urine AMBER (*)   ? APPearance HAZY (*)   ? All other components within normal limits  ?POC OCCULT BLOOD, ED  ?TYPE AND SCREEN  ? ? ?EKG ?EKG Interpretation ? ?Date/Time:  Tuesday November 12 2021 13:10:14 EDT ?Ventricular Rate:  73 ?PR Interval:  188 ?QRS Duration: 82 ?QT Interval:  446 ?QTC Calculation: 492 ?R Axis:   12 ?Text Interpretation: Sinus or ectopic atrial rhythm Abnormal R-wave progression, early transition LVH with secondary repolarization abnormality Borderline prolonged QT interval overall similar to October 27 2021 Confirmed by Sherwood Gambler [phone]) on  11/12/2021 1:12:39 PM ? ?Radiology ?CT Head Wo Contrast ? ?Result Date: 11/12/2021 ?CLINICAL DATA:  Mental status change, unknown cause EXAM: CT HEAD WITHOUT CONTRAST TECHNIQUE: Contiguous axial images were obtained from the base of the skull through the vertex without intravenous contrast. RADIATION DOSE REDUCTION: This exam was performed according to the departmental dose-optimization program which includes automated exposure control, adjustment of the mA and/or kV according to patient size and/or use of iterative reconstruction technique. COMPARISON:  10/27/2021 FINDINGS: Brain: There is no acute intracranial hemorrhage, mass effect, or edema. Gray-white differentiation is preserved.  There is no extra-axial fluid collection. Prominence of the ventricles and sulci reflects stable parenchymal volume loss. Patchy and confluent hypoattenuation in the supratentorial white matter is nonspecific but probably reflects stable chronic microvascular ischemic changes. Vascular: There is atherosclerotic calcification at the skull base. Skull: Calvarium is unremarkable. Sinuses/Orbits: No acute finding. Other: None. IMPRESSION: No acute intracranial abnormality. No significant change since recent prior study. Electronically Signed   By: Macy Mis M.D.   On: 11/12/2021 15:21   ? ?Procedures ?Procedures  ? ? ?Medications Ordered in ED ?Medications  ?lactated ringers bolus 500 mL (0 mLs Intravenous Stopped 11/12/21 1516)  ? ? ?ED Course/ Medical Decision Making/ A&P ?  ?                        ?Medical Decision Making ?Amount and/or Complexity of Data Reviewed ?Independent Historian: caregiver and EMS ?External Data Reviewed: labs and notes. ?Labs: ordered. ?Radiology: ordered and independent interpretation performed. ?ECG/medicine tests: ordered and independent interpretation performed. ? ? ?Patient has some soft blood pressures here but no hypotension in the emergency department.  She was given a small bolus of fluid.  Chart  reviewed and she was recently discharged after stay in the hospital after a fall and then subsequent in her inguinal hernia repair.  She has been dealing with tremors for quite some time and due to these

## 2021-11-12 NOTE — Progress Notes (Signed)
This encounter was created in error - please disregard.

## 2021-11-12 NOTE — Discharge Instructions (Addendum)
Your hemoglobin today is 8.9.  You will need this rechecked in 2-3 days.  If you develop new or worsening fatigue, bleeding such as blood in the stools or black stools, or any other new/concerning symptoms then return to the ER for evaluation. ?

## 2021-11-13 ENCOUNTER — Non-Acute Institutional Stay (SKILLED_NURSING_FACILITY): Payer: Medicare HMO | Admitting: Adult Health

## 2021-11-13 ENCOUNTER — Encounter: Payer: Self-pay | Admitting: Adult Health

## 2021-11-13 DIAGNOSIS — I5032 Chronic diastolic (congestive) heart failure: Secondary | ICD-10-CM

## 2021-11-13 DIAGNOSIS — D62 Acute posthemorrhagic anemia: Secondary | ICD-10-CM

## 2021-11-13 DIAGNOSIS — I959 Hypotension, unspecified: Secondary | ICD-10-CM | POA: Diagnosis not present

## 2021-11-13 DIAGNOSIS — I10 Essential (primary) hypertension: Secondary | ICD-10-CM | POA: Diagnosis not present

## 2021-11-13 NOTE — Progress Notes (Signed)
? ?Location:  [facility] ?Nursing Home Room Number: 850-Y ?Place of Service:  SNF (31) ?Provider:  Durenda Age, DNP, FNP-BC ? ?Patient Care Team: ?[name], NP as PCP - General ? ?Extended Emergency Contact Information ?Primary Emergency Contact: Wishart,NANETTE ?         [place], [place] [address] [place] ?Home Phone: [phone] ?Mobile Phone: [phone] ?Relation: Daughter ?Secondary Emergency Contact: Kearse,Dester ?         [place], [place] [address] [place] ?Home Phone: [phone] ?Mobile Phone: [phone] ?Relation: Daughter ? ?Code Status:  DNR ? ?Goals of care: Advanced Directive information ? ?  11/13/2021  ?  3:09 PM  ?Advanced Directives  ?Does Patient Have a Medical Advance Directive? Yes  ?Type of Advance Directive Out of facility DNR (pink MOST or yellow form)  ?Does patient want to make changes to medical advance directive? No - Patient declined  ?Pre-existing out of facility DNR order (yellow form or pink MOST form) Yellow form placed in chart (order not valid for inpatient use)  ? ? ? ?Chief Complaint  ?Patient presents with  ? Acute Visit  ?  Follow-up ED visit  ? ? ?HPI:  ?Pt is a 88 y.o. female seen today for follow up ED visit. She is a short-term care resident of [facility] resident who was transferred to ED on 11/12/21 after syncope. Prior to transfer to ED, her SBP was in the 180s so she was given extra dose of Spironolactone 12.5 mg. Earlier during the day, she was given Labetalol 50 mg, clonidine 0.2 mg, isosorbide and an 20 mg and spironolactone 12.5 mg. She had altered consciousness and SBP went down to 86/52. EMS was called. She was given 100 cc IV fluid by EMS in the ambulance and her BP improved. She had a recent surgery for incarcerated inguinal hernia. She denied abdominal pain. ? ?Son arrived later in ED and reported that patient had been intermittently confused but feels like patient in in her normal state of  mind. Hgb was 8.9, slight decrease from 4/14 hgb 9.3. Stool occult was negative. CT head was negative for edema or hemorrhage. ? ?She was then transferred back to [facility] with no new orders. ? ?Past Medical History:  ?Diagnosis Date  ? Anemia   ? Arthritis   ? knees, hands  ? Coronary artery disease   ? Diabetes mellitus without complication ([place])   ? no meds  ? Dyspnea 01/12/2018  ? w/anemia dx and hospital admit w/blood transfused   ? 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 History of blood transfusion 01/12/2018  ? at [facility]  ? Hyperlipemia   ? Hypertension   ? SVD (spontaneous vaginal delivery)   ? x 6 - 5 living children  ? Uses walker   ? rollator  ? Wears partial dentures   ? upper and lower partials  ? ?Past Surgical History:  ?Procedure Laterality Date  ? COLONOSCOPY    ? EYE SURGERY    ? cataracts removed right eye  ? HYSTEROSCOPY WITH D & C N/A 05/06/2018  ? Procedure: DILATATION AND CURETTAGE /HYSTEROSCOPY;  Surgeon: [name], MD;  Location: [facility];  Service: Gynecology;  Laterality: N/A;  ? INGUINAL HERNIA REPAIR Left 10/31/2021  ? Procedure: OPEN REPAIR LEFT INCARCERATED INGUINAL HERNIA;  Surgeon: Greer Pickerel, MD;  Location: [name];  Service: General;  Laterality: Left;  ? LAPAROSCOPY N/A 08/26/2018  ? Procedure: LAPAROSCOPY DIAGNOSTIC;  Surgeon: Brantley Stage,  Marcello Moores, MD;  Location: [facility];  Service: General;  Laterality: N/A;  ? MULTIPLE TOOTH EXTRACTIONS    ? ROBOTIC ASSISTED TOTAL HYSTERECTOMY WITH BILATERAL SALPINGO OOPHERECTOMY Bilateral 05/24/2018  ? Procedure: XI ROBOTIC ASSISTED TOTAL HYSTERECTOMY WITH BILATERAL SALPINGO OOPHORECTOMY;  Surgeon: [name], MD;  Location: [name];  Service: Gynecology;  Laterality: Bilateral;  ? SENTINEL NODE BIOPSY N/A 05/24/2018  ? Procedure: SENTINEL LYMP  NODE BIOPSY;  Surgeon: [name], MD;  Location: [name];  Service: Gynecology;  Laterality: N/A;  ? ? ?Allergies  ?Allergen Reactions  ? Other Anaphylaxis and Swelling  ?   Walnuts = Throat swells  ? Penicillins Swelling and Rash  ?  Has patient had a PCN reaction causing immediate rash, facial/tongue/throat swelling, SOB or lightheadedness with hypotension: Yes ?Has patient had a PCN reaction causing severe rash involving mucus membranes or skin necrosis: Unk ?Has patient had a PCN reaction that required hospitalization: Unk ?Has patient had a PCN reaction occurring within the last 10 years: No ?If all of the above answers are "NO", then may proceed with Cephalosporin use. ?  ? ? ?Outpatient Encounter Medications as of 11/13/2021  ?Medication Sig  ? acetaminophen (TYLENOL) 500 MG tablet Take 1,000 mg by mouth every 6 (six) hours as needed (for pain).   ? bisacodyl (DULCOLAX) 10 MG suppository If not relieved by MOM, give 10 mg Bisacodyl suppositiory rectally X 1 dose in 24 hours as needed  ? Calcium Carb-Cholecalciferol (CALCIUM + D3 PO) Take by mouth. 315 mg w/250 IU take 1 tablet po daily  ? cloNIDine (CATAPRES) 0.2 MG tablet Take 0.2 mg by mouth 2 (two) times daily.  ? docusate sodium (COLACE) 100 MG capsule Take 1 capsule (100 mg total) by mouth 2 (two) times daily as needed for mild constipation or moderate constipation.  ? ferrous sulfate 325 (65 FE) MG EC tablet Take 1 tablet (325 mg total) by mouth 2 (two) times daily.  ? isosorbide mononitrate (ISMO,MONOKET) 20 MG tablet Take 20 mg by mouth daily.  ? labetalol (NORMODYNE) 100 MG tablet Take 0.5 tablets (50 mg total) by mouth 2 (two) times daily.  ? Magnesium Hydroxide (MILK OF MAGNESIA PO) If no BM in 3 days, give 30 cc Milk of Magnesium p.o. x 1 dose in 24 hours as needed  ? melatonin 3 MG TABS tablet Take 1 tablet (3 mg total) by mouth at bedtime as needed.  ? Multiple Vitamin (MULTIVITAMIN) tablet Take 1 tablet by mouth daily.  ? Nutritional Supplements (ENSURE ORIGINAL) LIQD Take by mouth. DRINK 1 BOTTLE BY MOUTH AT BEDTIME FOR SUPPLEMENT  ? omeprazole (PRILOSEC) 20 MG capsule Take 20 mg by mouth daily.  ? senna (SENOKOT)  8.6 MG TABS tablet Take 8.6 mg by mouth daily as needed for mild constipation.  ? simvastatin (ZOCOR) 20 MG tablet Take 20 mg by mouth at bedtime.  ? Sodium Phosphates (RA SALINE ENEMA RE) If not relieved by Biscodyl suppository, give disposable Saline Enema rectally X 1 dose/24 hrs as needed  ? spironolactone (ALDACTONE) 25 MG tablet Take 0.5 tablets (12.5 mg total) by mouth daily.  ? vitamin C (ASCORBIC ACID) 500 MG tablet Take 500 mg by mouth daily.  ? [DISCONTINUED] furosemide (LASIX) 40 MG tablet Take 40 mg by mouth as needed.  ? ?No facility-administered encounter medications on file as of 11/13/2021.  ? ? ?Review of Systems  ?Constitutional:  Negative for appetite change, chills, fatigue and fever.  ?HENT:  Negative for congestion, hearing loss,  rhinorrhea and sore throat.   ?Eyes: Negative.   ?Respiratory:  Negative for cough, shortness of breath and wheezing.   ?Cardiovascular:  Negative for chest pain, palpitations and leg swelling.  ?Gastrointestinal:  Negative for abdominal pain, constipation, diarrhea, nausea and vomiting.  ?Genitourinary:  Negative for dysuria.  ?Musculoskeletal:  Negative for arthralgias, back pain and myalgias.  ?Skin:  Negative for color change, rash and wound.  ?Neurological:  Positive for syncope. Negative for dizziness, weakness and headaches.  ?Psychiatric/Behavioral:  Negative for behavioral problems. The patient is not nervous/anxious.    ? ? ? ?Immunization History  ?Administered Date(s) Administered  ? Influenza,inj,Quad PF,6+ Mos 04/07/2018  ? Tdap 04/07/2018  ? ?Pertinent  Health Maintenance Due  ?Topic Date Due  ? OPHTHALMOLOGY EXAM  Never done  ? HEMOGLOBIN A1C  11/18/2018  ? INFLUENZA VACCINE  02/25/2022  ? FOOT EXAM  09/11/2022  ? DEXA SCAN  Completed  ? ? ?  11/02/2021  ?  8:00 PM 11/03/2021  ? 10:30 AM 11/03/2021  ?  8:00 PM 11/04/2021  ?  8:32 AM 11/12/2021  ?  1:02 PM  ?Fall Risk  ?Patient Fall Risk Level Moderate fall risk Moderate fall risk Moderate fall risk High fall  risk Low fall risk  ? ? ? ?Vitals:  ? 11/13/21 1450  ?BP: 133/65  ?Pulse: 78  ?Resp: 20  ?Temp: 97.7 ?F (36.5 ?C)  ?Weight: 153 lb 9.6 oz (69.7 kg)  ?Height: '5\' 2"'$  (1.575 m)  ? ?Body mass index is 28.09

## 2021-11-18 ENCOUNTER — Non-Acute Institutional Stay (SKILLED_NURSING_FACILITY): Payer: Medicare HMO | Admitting: Internal Medicine

## 2021-11-18 ENCOUNTER — Encounter: Payer: Self-pay | Admitting: Internal Medicine

## 2021-11-18 DIAGNOSIS — D509 Iron deficiency anemia, unspecified: Secondary | ICD-10-CM

## 2021-11-18 DIAGNOSIS — I10 Essential (primary) hypertension: Secondary | ICD-10-CM | POA: Diagnosis not present

## 2021-11-18 DIAGNOSIS — R404 Transient alteration of awareness: Secondary | ICD-10-CM | POA: Insufficient documentation

## 2021-11-18 NOTE — Assessment & Plan Note (Signed)
Hgb back up to 11.3 on iron supplement. ?

## 2021-11-18 NOTE — Patient Instructions (Signed)
See assessment and plan under acutely for this visit ? ?

## 2021-11-18 NOTE — Progress Notes (Signed)
? ?NURSING HOME LOCATION:  [facility] ?ROOM NUMBER:  202 [id number] ?CODE STATUS:  DNR ? ?PCP:  Durenda Age NP,PSC ? ?This is a nursing facility follow up visit for specific acute issue of acute mental status change manifested as altered consciousness. ? ?Interim medical record and care since last SNF visit was updated with review of diagnostic studies and change in clinical status since last visit were documented. ? ?HPI: I was asked to see her acutely due to altered consciousness.  She was sitting in the chair near the nurses' station when she seemingly became unresponsive. Slightly noxious stimuli was attempted but she kept her eyes closed and head on her chest.  The Nurse who was called to see her stated the patient did look up @ her and opened one eye and then dropped her head back down on her chest and closed both eyes. ?She was taken to her room and stat glucose done which was 132.  Vital signs were taken and these were normal.  Overt seizure behavior was not documented. ? ?This episode mimicked the event 11/12/2021 for which she was seen in the ED.  Prior to apparent LOC, systolic blood pressure was in the 180s and she was given 12.5 mg of Aldactone with subsequent reported blood pressure of 70 with associated lethargy.EMS stated the patient was asleep when they first arrived but would wake up to gentle sternal rub.  Initially blood pressure was in the 50s in the ambulance.  She was lay supine and given 100 cc of IV fluids with improvement in blood pressure. ?In the ED she was responding normally.  CTA revealed no acute process and was unchanged from prior imaging.  Creatinine was 1.06 and GFR 51 indicating CKD stage IIIa.  Albumin was 3.4 and total protein 6.2, both mildly reduced.  H/H was 8.9/28.6, down from values of 11.9/36.6 on 4/7. ?CBC was rechecked 4/20 at the SNF and hemoglobin was 11.3/hematocrit 34.5. ? ?By history the patient has been having confusion and  hallucinations intermittently since 4/14.  Speech Therapist at the SNF stated that she was hallucinating that someone was in her room either under the bed or in the closet.  She did not calm down until she was taken out into the hallway. ?Staff reports that this morning she was crying and yelling out until her daughter arrived.  She told her daughter that nothing was wrong.  Her daughter is insistent that the patient go to the dining room and sit up for meals.  Staff states that the patient does not wish to do this. ? ?Review of systems: At this time she states she is asymptomatic except for being "tired". ? ?Constitutional: No fever, significant weight change  ?Eyes: No redness, discharge, pain, vision change ?Cardiovascular: No chest pain, palpitations, paroxysmal nocturnal dyspnea, edema  ?Respiratory: No cough, sputum production, hemoptysis, DOE, significant snoring, apnea   ?Gastrointestinal: No heartburn, dysphagia, abdominal pain, nausea /vomiting, rectal bleeding, melena, change in bowels ?Genitourinary: No dysuria, hematuria, pyuria, incontinence, nocturia ?Musculoskeletal: No joint stiffness, joint swelling, weakness, pain ?Dermatologic: No rash, pruritus, change in appearance of skin ?Neurologic: No dizziness, headache,  seizures, numbness, tingling ?Psychiatric: No significant anxiety, depression, insomnia, anorexia ?Endocrine: No change in hair/skin/nails, excessive thirst, excessive hunger, excessive urination  ?Hematologic/lymphatic: No significant bruising, lymphadenopathy, abnormal bleeding ? ?Physical exam:  ?Pertinent or positive findings: At this time she is alert and follows commands.  There is exotropia of the right eye.  Visual acuity is  decreased in the right eye; she states this is a chronic issue.  She has a grade 7-5.6 systolic murmur at the right base.  Breath sounds are decreased.  She has 1/2+ edema.  Pedal pulses are decreased.  Strength to opposition is surprisingly good.  Varus  deformity of the lower extremities is noted. ? ?General appearance: Adequately nourished; no acute distress, increased work of breathing is present.   ?Lymphatic: No lymphadenopathy about the head, neck, axilla. ?Eyes: No conjunctival inflammation or lid edema is present. There is no scleral icterus. ?Ears:  External ear exam shows no significant lesions or deformities.   ?Nose:  External nasal examination shows no deformity or inflammation. Nasal mucosa are pink and moist without lesions, exudates ?Oral exam:  There is no oropharyngeal erythema or exudate. ?Neck:  No thyromegaly, masses, tenderness noted.    ?Heart:  Normal rate and regular rhythm. S1 and S2 normal without gallop, click, rub .  ?Lungs:  without wheezes, rhonchi, rales, rubs. ?Abdomen: Bowel sounds are normal. Abdomen is soft and nontender with no organomegaly, hernias, masses. ?GU: Deferred  ?Extremities:  No cyanosis ?Neurologic exam :Balance, Rhomberg, finger to nose testing could not be completed due to clinical state ?Skin: No significant lesions or rash. ? ?See summary under each active problem in the Problem List with associated updated therapeutic plan ? ? ?

## 2021-11-18 NOTE — Assessment & Plan Note (Addendum)
Low-dose Keppra trial. Neurology consult with consideration for EEG. ?Subsequent Psychiatric consultation as clinically indicated if Neuro evaluation non diagnostic. ?

## 2021-11-18 NOTE — Assessment & Plan Note (Addendum)
BP controlled; no change in antihypertensive medications. Monitor for any postural hypotension component to recurrent AMS. ? ?

## 2021-12-04 ENCOUNTER — Encounter: Payer: Self-pay | Admitting: Internal Medicine

## 2021-12-04 ENCOUNTER — Non-Acute Institutional Stay (SKILLED_NURSING_FACILITY): Payer: Medicare HMO | Admitting: Internal Medicine

## 2021-12-04 DIAGNOSIS — R404 Transient alteration of awareness: Secondary | ICD-10-CM

## 2021-12-04 DIAGNOSIS — R1032 Left lower quadrant pain: Secondary | ICD-10-CM | POA: Diagnosis not present

## 2021-12-04 NOTE — Assessment & Plan Note (Addendum)
Patient apparently is complaining of pain at the hernia repair site.  Vital signs are stable, CBC is nonpathologic, and exam reveals normal bowel sounds.  There is subcutaneous firmness but she has had a mesh implantation. ?She exhibits significant neurocognitive issues with hallucinations which bring veracity of her complaints into play.  Surgical follow-up can be scheduled with Dr Greer Pickerel. ?

## 2021-12-04 NOTE — Patient Instructions (Signed)
See assessment and plan under each diagnosis in the problem list and acutely for this visit 

## 2021-12-04 NOTE — Progress Notes (Signed)
? ?  NURSING HOME LOCATION:  [facility] ?ROOM NUMBER:  [id number] ? ?CODE STATUS:  DNR ? ?PCP:  [name] NP ? ?This is a nursing facility follow up visit for specific acute issue of left lower quadrant abdominal pain. ? ?Interim medical record and care since last SNF visit was updated with review of diagnostic studies and change in clinical status since last visit were documented. ? ?HPI: She was complaining of abdominal pain in the area of her recent hernia repair.  The patient underwent open repair of the left incarcerated direct inguinal hernia with mesh procedure on 10/31/2021 by Dr. Redmond Pulling. ?She states this has been a recurrent problem; but staff reports this only as of 5/9.  The patient has been confused and described as hallucinating and even "talking to the wall". ?Initially she seemed to indicate she was having epigastric or supraumbilical pain but then she pointed at the  area of hernia repair.  She states that it is intermittent and worse when she passes gas.  She denies any rectal bleeding or significant constipation. ?CBC revealed significant improvement . CBC and differential revealed a white count of 3700 which is representative of her white count recently.  H/H was 11/32.7 with most recent values of 8.9 28.6 on 4/18.  Comprehensive metabolic profile was completely normal except for a GFR 55 indicating CKD stage IIIa.KUB was performed which revealed normal bowel gas pattern with no bowel distention , air-fluid levels or free air.    ? ?Review of systems: Neurocognitive deficits make the validity of her complaints questionable.  When I began to ask about the abdominal pain she began to confabulate about coming to SNF" to get a cheaper place through insurance". ? ?Physical exam:  ?Pertinent or positive findings: As noted she is somewhat confused.  She has resting exotropia of the right eye.  Bilateral ptosis is present.  She has complete dentures.  A grade 1.5 systolic murmur is  present.  Breath sounds are decreased.  Bowel sounds are active.  The hernia site shows no evidence of cellulitis.  There is some subcutaneous firmness without fluctuance.  There is no left inguinal lymphadenopathy. ? ?General appearance: Adequately nourished; no acute distress, increased work of breathing is present.   ?Lymphatic: No lymphadenopathy about the head, neck, axilla. ?Eyes: No conjunctival inflammation or lid edema is present. There is no scleral icterus. ?Ears:  External ear exam shows no significant lesions or deformities.   ?Nose:  External nasal examination shows no deformity or inflammation. Nasal mucosa are pink and moist without lesions, exudates ?Neck:  No thyromegaly, masses, tenderness noted.    ?Heart:  Normal rate and regular rhythm. S1 and S2 normal without gallop, click, rub .  ?Lungs:  without wheezes, rhonchi, rales, rubs. ?Abdomen:  Abdomen is soft  with no organomegaly, hernias, masses. ?GU: Deferred  ?Extremities:  No cyanosis, clubbing, edema  ?Skin: Warm & dry w/o tenting. ?No significant lesions or rash. ? ?See summary under each active problem in the Problem List with associated updated therapeutic plan ? ? ?

## 2021-12-04 NOTE — Assessment & Plan Note (Addendum)
Has been described as talking to the wall and hallucinating.  During my interview and exam 5/10; she began to confabulate about coming here for cheaper placement through insurance.  Although she is complaining of pain at the inguinal hernia repair site; clinically there is no evidence of bowel obstruction or infection.  Monitor will continue. ?Psych consult can be considered. ?

## 2021-12-10 ENCOUNTER — Encounter: Payer: Self-pay | Admitting: Adult Health

## 2021-12-10 ENCOUNTER — Non-Acute Institutional Stay (SKILLED_NURSING_FACILITY): Payer: Medicare HMO | Admitting: Adult Health

## 2021-12-10 DIAGNOSIS — R251 Tremor, unspecified: Secondary | ICD-10-CM

## 2021-12-10 DIAGNOSIS — R4189 Other symptoms and signs involving cognitive functions and awareness: Secondary | ICD-10-CM

## 2021-12-10 DIAGNOSIS — N390 Urinary tract infection, site not specified: Secondary | ICD-10-CM | POA: Diagnosis not present

## 2021-12-10 NOTE — Progress Notes (Signed)
Location:  [facility] Room Number: [place] of Service:  SNF (31) Provider:  Durenda Age, DNP, FNP-BC  Patient Care Team: [name], NP as PCP - General  Extended Emergency Contact Information Primary Emergency Contact: Deliah Goody, [place] [address] [place] Phone: [phone] Mobile Phone: [phone] Relation: Daughter Secondary Emergency Contact: Kipp Laurence, [place] [address] [place] Phone: [phone] Mobile Phone: [phone] Relation: Daughter  Code Status:  Full  Goals of care: Advanced Directive information    12/10/2021   10:50 AM  Advanced Directives  Does Patient Have a Medical Advance Directive? Yes  Type of Advance Directive Out of facility DNR (pink MOST or yellow form)  Does patient want to make changes to medical advance directive? No - Patient declined  Pre-existing out of facility DNR order (yellow form or pink MOST form) Yellow form placed in chart (order not valid for inpatient use)     Chief Complaint  Patient presents with   Acute Visit    UTI    HPI:  Pt is a 88 y.o. female seen today urine culture showing > 100,000 CFU/mL gram-positive cocci, with bacteria isolated Enterococcus faecalis. Urinalysis was ordered due to confusion. She was recently started on levetiracetam 250 mg 1 tablet by mouth twice a day for tremors, started 11/18/2021.  Noted to have bilateral hand tremors still but has not had change in level of consciousness. She denies having dysuria, hematuria, chills nor fever. Latest BIMS score 9/15, ranging in moderate cognitive impairment.   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   History of blood transfusion 01/12/2018   at [facility]   Hyperlipemia    Hypertension    SVD (spontaneous vaginal delivery)    x 6 - 5 living children   Uses walker    rollator   Wears partial dentures    upper and lower partials   Past Surgical History:  Procedure Laterality Date   COLONOSCOPY     EYE SURGERY     cataracts removed right eye   HYSTEROSCOPY WITH D & C N/A 05/06/2018   Procedure: DILATATION AND CURETTAGE /HYSTEROSCOPY;  Surgeon: [name], MD;  Location: [facility];  Service: Gynecology;  Laterality: N/A;   INGUINAL HERNIA REPAIR Left 10/31/2021   Procedure: OPEN REPAIR LEFT INCARCERATED INGUINAL HERNIA;  Surgeon: Greer Pickerel, MD;  Location: [name];  Service: General;  Laterality: Left;   LAPAROSCOPY N/A 08/26/2018   Procedure: LAPAROSCOPY DIAGNOSTIC;  Surgeon: [name], MD;  Location: [facility];  Service: General;  Laterality: N/A;   MULTIPLE TOOTH EXTRACTIONS     ROBOTIC ASSISTED TOTAL HYSTERECTOMY WITH BILATERAL SALPINGO OOPHERECTOMY Bilateral 05/24/2018   Procedure: XI ROBOTIC ASSISTED TOTAL HYSTERECTOMY WITH BILATERAL SALPINGO OOPHORECTOMY;  Surgeon: [name], MD;  Location: [name];  Service: Gynecology;  Laterality: Bilateral;   SENTINEL NODE BIOPSY N/A 05/24/2018   Procedure: SENTINEL LYMP  NODE BIOPSY;  Surgeon: [name], MD;  Location: [name];  Service: Gynecology;  Laterality: N/A;    Allergies  Allergen Reactions   Other Anaphylaxis  and Swelling    Walnuts = Throat swells   Penicillins Swelling and Rash    Has patient had a PCN reaction causing immediate rash, facial/tongue/throat swelling, SOB or lightheadedness with hypotension: Yes Has patient had a PCN reaction causing severe rash involving mucus membranes or skin necrosis: Unk Has patient had a PCN reaction that required hospitalization: Unk Has patient had a PCN reaction occurring within the last 10 years: No If all of the above answers are "NO", then may proceed with  Cephalosporin use.     Outpatient Encounter Medications as of 12/10/2021  Medication Sig   acetaminophen (TYLENOL) 500 MG tablet Take 1,000 mg by mouth every 6 (six) hours as needed (for pain).    bisacodyl (DULCOLAX) 10 MG suppository If not relieved by MOM, give 10 mg Bisacodyl suppositiory rectally X 1 dose in 24 hours as needed   Calcium Carb-Cholecalciferol (CALCIUM + D3 PO) Take by mouth. 315 mg w/250 IU take 1 tablet po daily   cloNIDine (CATAPRES) 0.2 MG tablet Take 0.2 mg by mouth 2 (two) times daily.   docusate sodium (COLACE) 100 MG capsule Take 1 capsule (100 mg total) by mouth 2 (two) times daily as needed for mild constipation or moderate constipation.   ferrous sulfate 325 (65 FE) MG EC tablet Take 1 tablet (325 mg total) by mouth 2 (two) times daily.   isosorbide mononitrate (ISMO,MONOKET) 20 MG tablet Take 20 mg by mouth daily.   labetalol (NORMODYNE) 100 MG tablet Take 0.5 tablets (50 mg total) by mouth 2 (two) times daily.   Magnesium Hydroxide (MILK OF MAGNESIA PO) If no BM in 3 days, give 30 cc Milk of Magnesium p.o. x 1 dose in 24 hours as needed   melatonin 3 MG TABS tablet Take 1 tablet (3 mg total) by mouth at bedtime as needed.   Multiple Vitamin (MULTIVITAMIN) tablet Take 1 tablet by mouth daily.   Nutritional Supplements (ENSURE ORIGINAL) LIQD Take by mouth. DRINK 1 BOTTLE BY MOUTH AT BEDTIME FOR SUPPLEMENT   omeprazole (PRILOSEC) 20 MG capsule Take 20 mg by mouth daily.   senna (SENOKOT) 8.6 MG TABS tablet Take 8.6 mg by mouth daily as needed for mild constipation.   simvastatin (ZOCOR) 20 MG tablet Take 20 mg by mouth at bedtime.   Sodium Phosphates (RA SALINE ENEMA RE) If not relieved by Biscodyl suppository, give disposable Saline Enema rectally X 1 dose/24 hrs as needed   spironolactone (ALDACTONE) 25 MG tablet Take 0.5 tablets (12.5 mg total) by mouth daily.   vitamin C (ASCORBIC ACID) 500 MG tablet Take 500 mg by mouth daily.   [DISCONTINUED] furosemide  (LASIX) 40 MG tablet Take 40 mg by mouth as needed.   No facility-administered encounter medications on file as of 12/10/2021.    Review of Systems  Constitutional:  Negative for appetite change, chills, fatigue and fever.  HENT:  Negative for congestion, hearing loss, rhinorrhea and sore throat.   Eyes: Negative.   Respiratory:  Negative for cough, shortness of breath and wheezing.   Cardiovascular:  Negative for chest pain, palpitations and leg swelling.  Gastrointestinal:  Negative for abdominal pain, constipation, diarrhea, nausea and vomiting.  Genitourinary:  Negative for dysuria.  Musculoskeletal:  Negative for arthralgias, back pain and myalgias.  Skin:  Negative for color change, rash and wound.  Neurological:  Negative for dizziness, weakness and headaches.  Psychiatric/Behavioral:  The patient is not nervous/anxious.       Immunization History  Administered Date(s) Administered  Influenza,inj,Quad PF,6+ Mos 04/07/2018   Tdap 04/07/2018   Pertinent  Health Maintenance Due  Topic Date Due   OPHTHALMOLOGY EXAM  Never done   HEMOGLOBIN A1C  11/18/2018   INFLUENZA VACCINE  02/25/2022   FOOT EXAM  09/11/2022   DEXA SCAN  Completed      11/02/2021    8:00 PM 11/03/2021   10:30 AM 11/03/2021    8:00 PM 11/04/2021    8:32 AM 11/12/2021    1:02 PM  Fall Risk  Patient Fall Risk Level Moderate fall risk Moderate fall risk Moderate fall risk High fall risk Low fall risk     Vitals:   12/10/21 1052  BP: (!) 148/79  Pulse: 62  Resp: (!) 21  Temp: (!) 97.3 F (36.3 C)  Weight: 150 lb 6.4 oz (68.2 kg)  Height: '5\' 2"'$  (1.575 m)   Body mass index is 27.51 kg/m.  Physical Exam Constitutional:      Appearance: Normal appearance.  HENT:     Head: Normocephalic and atraumatic.     Nose: Nose normal.     Mouth/Throat:     Mouth: Mucous membranes are moist.  Eyes:     Conjunctiva/sclera: Conjunctivae normal.  Cardiovascular:     Rate and Rhythm: Normal rate and regular  rhythm.  Pulmonary:     Effort: Pulmonary effort is normal.     Breath sounds: Normal breath sounds.  Abdominal:     General: Bowel sounds are normal.     Palpations: Abdomen is soft.  Musculoskeletal:        General: Normal range of motion.     Cervical back: Normal range of motion.  Skin:    General: Skin is warm and dry.  Neurological:     Mental Status: She is alert. Mental status is at baseline.     Comments: +tremors on bilateral hands.  Psychiatric:        Mood and Affect: Mood normal.        Behavior: Behavior normal.       Labs reviewed: Recent Labs    10/28/21 0403 10/29/21 0424 10/31/21 0412 11/01/21 0424 11/12/21 1309  NA 134* 135 136 134* 138  K 3.5 4.1 4.1 4.2 3.6  CL 98 102 101 99 107  CO2 '28 27 28 26 25  '$ GLUCOSE 90 87 97 99 121*  BUN 26* 26* 16 24* 16  CREATININE 1.05* 0.91 0.59 1.07* 1.06*  CALCIUM 9.2 9.2 9.2 9.2 8.3*  MG 2.4 2.2  --   --   --    Recent Labs    10/27/21 1202 10/28/21 0403 11/12/21 1309  AST 41 31 28  ALT '15 14 14  '$ ALKPHOS 74 66 66  BILITOT 0.8 0.7 0.4  PROT 7.2 6.0* 6.2*  ALBUMIN 4.1 3.4* 3.4*   Recent Labs    10/05/21 1201 10/27/21 1202 10/28/21 0403 10/31/21 0412 11/01/21 0424 11/12/21 1309  WBC 3.9* 5.2   < > 4.0 7.9 4.1  NEUTROABS 2.8 3.9  --   --   --  3.2  HGB 10.1* 12.7   < > 11.5* 11.9* 8.9*  HCT 31.8* 38.9   < > 37.0 36.6 28.6*  MCV 88.6 85.7   < > 88.3 87.6 90.2  PLT 200 232   < > 224 229 258   < > = values in this interval not displayed.   Lab Results  Component Value Date   TSH 0.657 07/24/2018   Lab Results  Component Value  Date   HGBA1C 5.0 05/19/2018   No results found for: CHOL, HDL, LDLCALC, LDLDIRECT, TRIG, CHOLHDL  Significant Diagnostic Results in last 30 days:  No results found.  Assessment/Plan  1. Urinary tract infection without hematuria, site unspecified -   will start on doxycycline 100 mg twice a day x7 days and Florastor 250 mg 1 capsule twice a day x10 days  2. Tremors  of nervous system -   Continue levetiracetam -    Fall precautions  3. Cognitive impairment -   Continue supportive care    Family/ staff Communication: Discussed plan of care with resident and charge nurse.  Labs/tests ordered:   None    Durenda Age, DNP, MSN, FNP-BC [facility] [phone] (Monday-Friday 8:00 a.m. - 5:00 p.m.) [phone] (after hours)

## 2021-12-11 ENCOUNTER — Ambulatory Visit: Payer: Medicare HMO | Admitting: Podiatry

## 2022-02-01 ENCOUNTER — Emergency Department (HOSPITAL_COMMUNITY): Payer: Medicare HMO

## 2022-02-01 ENCOUNTER — Encounter (HOSPITAL_COMMUNITY): Payer: Self-pay

## 2022-02-01 ENCOUNTER — Emergency Department (HOSPITAL_COMMUNITY)
Admission: EM | Admit: 2022-02-01 | Discharge: 2022-02-01 | Disposition: A | Discharge status: Home / Self Care | Payer: Medicare HMO | Attending: Emergency Medicine | Admitting: Emergency Medicine

## 2022-02-01 ENCOUNTER — Other Ambulatory Visit: Payer: Self-pay

## 2022-02-01 DIAGNOSIS — R410 Disorientation, unspecified: Secondary | ICD-10-CM | POA: Diagnosis not present

## 2022-02-01 DIAGNOSIS — Z79899 Other long term (current) drug therapy: Secondary | ICD-10-CM | POA: Insufficient documentation

## 2022-02-01 DIAGNOSIS — I10 Essential (primary) hypertension: Secondary | ICD-10-CM | POA: Diagnosis not present

## 2022-02-01 DIAGNOSIS — E119 Type 2 diabetes mellitus without complications: Secondary | ICD-10-CM | POA: Insufficient documentation

## 2022-02-01 DIAGNOSIS — F0390 Unspecified dementia without behavioral disturbance: Secondary | ICD-10-CM | POA: Insufficient documentation

## 2022-02-01 DIAGNOSIS — I2510 Atherosclerotic heart disease of native coronary artery without angina pectoris: Secondary | ICD-10-CM | POA: Diagnosis not present

## 2022-02-01 DIAGNOSIS — R55 Syncope and collapse: Secondary | ICD-10-CM | POA: Insufficient documentation

## 2022-02-01 DIAGNOSIS — D649 Anemia, unspecified: Secondary | ICD-10-CM | POA: Insufficient documentation

## 2022-02-01 DIAGNOSIS — Z8542 Personal history of malignant neoplasm of other parts of uterus: Secondary | ICD-10-CM | POA: Diagnosis not present

## 2022-02-01 LAB — BASIC METABOLIC PANEL
Anion gap: 10 (ref 5–15)
BUN: 15 mg/dL (ref 8–23)
CO2: 26 mmol/L (ref 22–32)
Calcium: 9.4 mg/dL (ref 8.9–10.3)
Chloride: 102 mmol/L (ref 98–111)
Creatinine, Ser: 0.8 mg/dL (ref 0.44–1.00)
GFR, Estimated: >60 mL/min (ref >60)
Glucose, Bld: 117 mg/dL — ABNORMAL HIGH (ref 70–99)
Potassium: 3.9 mmol/L (ref 3.5–5.1)
Sodium: 138 mmol/L (ref 135–145)

## 2022-02-01 LAB — CBC WITH DIFFERENTIAL/PLATELET
Abs Immature Granulocytes: 0.01 10*3/uL (ref 0.00–0.07)
Basophils Absolute: 0 10*3/uL (ref 0.0–0.1)
Basophils Relative: 0 %
Eosinophils Absolute: 0 10*3/uL (ref 0.0–0.5)
Eosinophils Relative: 1 %
HCT: 34.8 % — ABNORMAL LOW (ref 36.0–46.0)
Hemoglobin: 10.8 g/dL — ABNORMAL LOW (ref 12.0–15.0)
Immature Granulocytes: 0 %
Lymphocytes Relative: 17 %
Lymphs Abs: 0.7 10*3/uL (ref 0.7–4.0)
MCH: 27.3 pg (ref 26.0–34.0)
MCHC: 31 g/dL (ref 30.0–36.0)
MCV: 88.1 fL (ref 80.0–100.0)
Monocytes Absolute: 0.5 10*3/uL (ref 0.1–1.0)
Monocytes Relative: 14 %
Neutro Abs: 2.7 10*3/uL (ref 1.7–7.7)
Neutrophils Relative %: 68 %
Platelets: 217 10*3/uL (ref 150–400)
RBC: 3.95 MIL/uL (ref 3.87–5.11)
RDW: 15.2 % (ref 11.5–15.5)
WBC: 3.9 10*3/uL — ABNORMAL LOW (ref 4.0–10.5)
nRBC: 0 % (ref 0.0–0.2)

## 2022-02-01 LAB — TROPONIN I (HIGH SENSITIVITY)
Troponin I (High Sensitivity): 10 ng/L (ref <18)
Troponin I (High Sensitivity): 9 ng/L (ref <18)

## 2022-02-01 MED ORDER — TRAMADOL HCL 50 MG PO TABS
25.0000 mg | ORAL_TABLET | Freq: Once | ORAL | Status: AC
  Administered 2022-02-01: 25 mg via ORAL
  Filled 2022-02-01: qty 1

## 2022-02-01 NOTE — ED Provider Notes (Signed)
[place] EMERGENCY DEPARTMENT Provider Note   CSN: [id number] Arrival date & time: 02/01/22  1312     History  Chief Complaint  Patient presents with   Loss of Consciousness    Brenda Alexander is a 88 y.o. female.   Loss of Consciousness Patient reportedly was found unresponsive in chair.  Per EMS patient may have fallen to the floor.  Per patient's daughter patient was still in the chair.  Does have some dementia at baseline.  Has had this episode happened before.  For me she is without complaints and states she feels better.  Had told prior providers that left rib pain.    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 Authorizing Provider  acetaminophen (TYLENOL) 500 MG tablet Take 1,000 mg by mouth every 6 (six) hours as needed (for pain).     [provider]  bisacodyl (DULCOLAX) 10 MG suppository If not relieved by MOM, give 10 mg Bisacodyl suppositiory rectally X 1 dose in 24 hours as needed    [provider]  Calcium Carb-Cholecalciferol (CALCIUM + D3 PO) Take by mouth. 315 mg w/250 IU take 1 tablet po daily    [provider]  cloNIDine (CATAPRES) 0.2 MG tablet Take 0.2 mg by mouth 2 (two) times daily. 08/06/21   [provider]  docusate sodium (COLACE) 100 MG capsule Take 1 capsule (100 mg total) by mouth 2 (two) times daily as needed  for mild constipation or moderate constipation. 05/06/18   Anyanwu, Sallyanne Havers, MD  ferrous sulfate 325 (65 FE) MG EC tablet Take 1 tablet (325 mg total) by mouth 2 (two) times daily. 01/13/18 07/28/48  Elgergawy, Silver Huguenin, MD  isosorbide mononitrate (ISMO,MONOKET) 20 MG tablet Take 20 mg by mouth daily. 12/23/17   [provider]  labetalol (NORMODYNE) 100 MG tablet Take 0.5 tablets (50 mg total) by mouth 2 (two) times daily. 11/01/21   Antonieta Pert, MD  Magnesium Hydroxide (MILK OF MAGNESIA PO) If no BM in 3 days, give 30 cc Milk of Magnesium p.o. x 1 dose in 24 hours as needed    [provider]  melatonin 3 MG TABS tablet Take 1 tablet (3 mg total) by mouth at bedtime as needed. 11/04/21   Antonieta Pert, MD  Multiple Vitamin (MULTIVITAMIN) tablet Take 1 tablet by mouth daily.    [provider]  Nutritional Supplements (ENSURE ORIGINAL) LIQD Take by mouth. DRINK 1 BOTTLE BY MOUTH AT BEDTIME FOR SUPPLEMENT    [provider]  omeprazole (PRILOSEC) 20 MG capsule Take 20 mg by mouth daily.    [provider]  senna (SENOKOT) 8.6 MG TABS tablet Take 8.6 mg by mouth daily as needed for mild constipation.    [provider]  simvastatin (ZOCOR) 20 MG tablet Take 20 mg by mouth at bedtime.    [provider]  Sodium Phosphates (RA SALINE ENEMA RE) If not relieved by Biscodyl suppository, give disposable Saline Enema rectally X 1 dose/24 hrs as needed    [provider]  spironolactone (ALDACTONE) 25 MG tablet Take 0.5 tablets (12.5 mg total) by mouth daily. 10/16/21   Patwardhan, Reynold Bowen, MD  vitamin C (ASCORBIC ACID) 500 MG tablet Take 500 mg by mouth daily.    [provider]  furosemide (LASIX) 40 MG tablet Take 40 mg by mouth as needed.  08/06/20  [provider]      Allergies    Other and Penicillins    Review of Systems   Review of Systems  Cardiovascular:  Positive for syncope.    Physical Exam Updated Vital  Signs BP (S) (!) 202/98 (BP Location: Right Arm) Comment: SEE NURSE NOTE  Pulse 66   Temp 97.7 F (36.5 C) (Oral)   Resp 19   LMP  (LMP Unknown)   SpO2 99%  Physical Exam Vitals and nursing note reviewed.  HENT:     Head: Normocephalic.  Eyes:     Pupils: Pupils are equal, round, and reactive to light.  Cardiovascular:     Rate and Rhythm: Regular rhythm.  Pulmonary:     Breath sounds: No wheezing or rhonchi.  Chest:     Chest wall: No tenderness.  Abdominal:     Tenderness: There is no abdominal tenderness.  Musculoskeletal:        General: No tenderness.     Cervical back: No tenderness.  Skin:    General: Skin is warm.  Neurological:     Mental Status: She is alert. Mental status is at baseline.     Comments: Awake and pleasant, with some confusion.     ED Results / Procedures / Treatments   Labs (all labs ordered are listed, but only abnormal results are displayed) Labs Reviewed  BASIC METABOLIC PANEL - Abnormal; Notable for the following components:      Result Value   Glucose, Bld 117 (*)    All other components within normal limits  CBC WITH DIFFERENTIAL/PLATELET - Abnormal; Notable for the following components:   WBC 3.9 (*)    Hemoglobin 10.8 (*)    HCT 34.8 (*)    All other components within normal limits  URINALYSIS, ROUTINE W REFLEX MICROSCOPIC  TROPONIN I (HIGH SENSITIVITY)  TROPONIN I (HIGH SENSITIVITY)    EKG EKG Interpretation  Date/Time:  Saturday February 01 2022 13:17:22 EDT Ventricular Rate:  77 PR Interval:  180 QRS Duration: 76 QT Interval:  446 QTC Calculation: 504 R Axis:   -12 Text Interpretation: Normal sinus rhythm Abnormal ECG When compared with ECG of 12-Nov-2021 13:10, No significant change since last tracing Confirmed by Davonna Belling [phone]) on 02/01/2022 4:25:13 PM  Radiology CT Head Wo Contrast  Result Date: 02/01/2022 CLINICAL DATA:  Trauma. EXAM: CT HEAD WITHOUT CONTRAST CT CERVICAL SPINE WITHOUT CONTRAST TECHNIQUE:  Multidetector CT imaging of the head and cervical spine was performed following the standard protocol without intravenous contrast. Multiplanar CT image reconstructions of the cervical spine were also generated. RADIATION DOSE REDUCTION: This exam was performed according to the departmental dose-optimization program which includes automated exposure control, adjustment of the mA and/or kV according to patient size and/or use of iterative reconstruction technique. COMPARISON:  CT head 11/12/2021.  Cervical spine CT 10/27/2021. FINDINGS: CT HEAD FINDINGS Brain: No  evidence of acute infarction, hemorrhage, hydrocephalus, extra-axial collection or mass lesion/mass effect. Mild diffuse atrophy and mild periventricular white matter hypodensity is unchanged, likely chronic small vessel ischemic disease. Empty sella again noted. Vascular: Atherosclerotic calcifications are present within the cavernous internal carotid arteries. Skull: Normal. Negative for fracture or focal lesion. Sinuses/Orbits: There is mucosal thickening of the right maxillary sinus. Mastoid air cells are clear. Orbits are unremarkable. Other: None. CT CERVICAL SPINE FINDINGS Alignment: Normal. Skull base and vertebrae: No acute fracture. No primary bone lesion or focal pathologic process. Soft tissues and spinal canal: No prevertebral fluid or swelling. No visible canal hematoma. Disc levels: There is disc space narrowing and endplate osteophyte formation throughout the cervical spine similar to the prior study. There is moderate central canal stenosis at C3-C4 and mild central canal stenosis at C 4 C5 and C6-C7 secondary to disc bulges similar to the prior study. No severe neural foraminal stenosis at any level. Upper chest: Patchy multifocal ground-glass opacities in the lung apices. Other: Thyroid gland is heterogeneous. IMPRESSION: 1. No acute intracranial process. 2. No acute cervical spine fracture or traumatic subluxation. 3. Patchy ground-glass  opacities in the lung apices, likely infectious/inflammatory. Otherwise stable chronic changes as above. Electronically Signed   By: Ronney Asters M.D.   On: 02/01/2022 16:37   CT Cervical Spine Wo Contrast  Result Date: 02/01/2022 CLINICAL DATA:  Trauma. EXAM: CT HEAD WITHOUT CONTRAST CT CERVICAL SPINE WITHOUT CONTRAST TECHNIQUE: Multidetector CT imaging of the head and cervical spine was performed following the standard protocol without intravenous contrast. Multiplanar CT image reconstructions of the cervical spine were also generated. RADIATION DOSE REDUCTION: This exam was performed according to the departmental dose-optimization program which includes automated exposure control, adjustment of the mA and/or kV according to patient size and/or use of iterative reconstruction technique. COMPARISON:  CT head 11/12/2021.  Cervical spine CT 10/27/2021. FINDINGS: CT HEAD FINDINGS Brain: No evidence of acute infarction, hemorrhage, hydrocephalus, extra-axial collection or mass lesion/mass effect. Mild diffuse atrophy and mild periventricular white matter hypodensity is unchanged, likely chronic small vessel ischemic disease. Empty sella again noted. Vascular: Atherosclerotic calcifications are present within the cavernous internal carotid arteries. Skull: Normal. Negative for fracture or focal lesion. Sinuses/Orbits: There is mucosal thickening of the right maxillary sinus. Mastoid air cells are clear. Orbits are unremarkable. Other: None. CT CERVICAL SPINE FINDINGS Alignment: Normal. Skull base and vertebrae: No acute fracture. No primary bone lesion or focal pathologic process. Soft tissues and spinal canal: No prevertebral fluid or swelling. No visible canal hematoma. Disc levels: There is disc space narrowing and endplate osteophyte formation throughout the cervical spine similar to the prior study. There is moderate central canal stenosis at C3-C4 and mild central canal stenosis at C 4 C5 and C6-C7 secondary  to disc bulges similar to the prior study. No severe neural foraminal stenosis at any level. Upper chest: Patchy multifocal ground-glass opacities in the lung apices. Other: Thyroid gland is heterogeneous. IMPRESSION: 1. No acute intracranial process. 2. No acute cervical spine fracture or traumatic subluxation. 3. Patchy ground-glass opacities in the lung apices, likely infectious/inflammatory. Otherwise stable chronic changes as above. Electronically Signed   By: Ronney Asters M.D.   On: 02/01/2022 16:37   DG Ribs Unilateral W/Chest Left  Result Date: 02/01/2022 CLINICAL DATA:  Status post fall with rib pain. EXAM: LEFT RIBS AND CHEST - 3+ VIEW COMPARISON:  Chest radiograph 10/27/2021 FINDINGS: No fracture or other bone lesions are seen involving the ribs. There is  no evidence of pneumothorax or pleural effusion. Both lungs are clear. Heart size and mediastinal contours are within normal limits. Degenerative changes are identified within both glenohumeral joints and acromioclavicular joints. IMPRESSION: Negative. Electronically Signed   By: Kerby Moors M.D.   On: 02/01/2022 14:33    Procedures Procedures    Medications Ordered in ED Medications  traMADol (ULTRAM) tablet 25 mg (25 mg Oral Given 02/01/22 2010)    ED Course/ Medical Decision Making/ A&P                           Medical Decision Making Risk Prescription drug management.   Patient presents after syncope.  Per daughter patient passed out in her chair.  May or may not of falling out of the chair.  No tenderness for me at this point.  X-rays reviewed and reassuring.  X-ray shows no rib fracture.  Head CT and cervical spine CT negative.  Patient is back at her baseline.  Hemoglobin is mildly below normal but appears to be at her baseline.  First troponin negative.  Second troponin also negative.  Patient feeling better.  Does have some chronic pain.  Discussed with patient and daughter.  Does have previous UTIs but this point I  think it is better to get her back home and to be worked up there.  Also hypertension but is not had the medicines.  Can take evening medicines at home.  Will discharge        Final Clinical Impression(s) / ED Diagnoses Final diagnoses:  Syncope, unspecified syncope type    Rx / DC Orders ED Discharge Orders     None         Davonna Belling, MD 02/01/22 2332

## 2022-02-01 NOTE — ED Triage Notes (Signed)
Patient sent from [facility] for reported syncopal episode and ems found patient alert and to baseline. Patient complains of left posterior rib pain, no cp, no sob

## 2022-02-01 NOTE — ED Notes (Signed)
No pacermaker per family.

## 2022-02-01 NOTE — ED Notes (Signed)
[facility] at bedside to transport pt back to [facility]. Vital signs checked prior to departure and pt noted to be hypertensive. Daughter at bedside acknowledges that pt has hx of the same when missing medications. Dr. Alvino Chapel notified of the same and cleared pt for discharge stating that she is okay to take evening medication upon arrival to facility. [facility] made aware of same and pt transported back to facility via stretcher.

## 2022-02-01 NOTE — ED Provider Triage Note (Signed)
Emergency Medicine Provider Triage Evaluation Note  Brenda Alexander , a 88 y.o. female  was evaluated in triage.  Pt complains of syncope.  Patient coming from [facility].  EMS reports that patient became pale and lost consciousness.  She fell to the floor.  She is currently at her baseline mental status.  Patient appears extremely uncomfortable.  She is complaining of pain in her left ribs.  No blood thinners.  Review of Systems  Positive: Syncope Negative:   Physical Exam  BP 112/63 (BP Location: Right Arm)   Pulse 68   Temp 98.9 F (37.2 C) (Oral)   Resp 16   LMP  (LMP Unknown)   SpO2 98%  Gen:   Awake, appears in pain Resp:  Breathing is guarded MSK:   Moves extremities without difficulty  Other:  No obvious head trauma, point tenderness in the left ribs  Medical Decision Making  Medically screening exam initiated at 1:45 PM.  Appropriate orders placed.  Brenda Alexander was informed that the remainder of the evaluation will be completed by another provider, this initial triage assessment does not replace that evaluation, and the importance of remaining in the ED until their evaluation is complete.  Work-up initiated   Margarita Mail, PA-C 02/01/22 1347

## 2022-05-05 ENCOUNTER — Observation Stay (HOSPITAL_COMMUNITY)
Admission: EM | Admit: 2022-05-05 | Discharge: 2022-05-08 | Disposition: A | Discharge status: Skilled Nursing Facility | Payer: Medicare HMO | Attending: Internal Medicine | Admitting: Internal Medicine

## 2022-05-05 ENCOUNTER — Emergency Department (HOSPITAL_COMMUNITY): Payer: Medicare HMO

## 2022-05-05 ENCOUNTER — Encounter (HOSPITAL_COMMUNITY): Payer: Self-pay | Admitting: Emergency Medicine

## 2022-05-05 ENCOUNTER — Observation Stay (HOSPITAL_COMMUNITY): Payer: Medicare HMO

## 2022-05-05 DIAGNOSIS — I2510 Atherosclerotic heart disease of native coronary artery without angina pectoris: Secondary | ICD-10-CM | POA: Diagnosis not present

## 2022-05-05 DIAGNOSIS — H5789 Other specified disorders of eye and adnexa: Secondary | ICD-10-CM | POA: Insufficient documentation

## 2022-05-05 DIAGNOSIS — R5381 Other malaise: Secondary | ICD-10-CM

## 2022-05-05 DIAGNOSIS — R29810 Facial weakness: Secondary | ICD-10-CM

## 2022-05-05 DIAGNOSIS — Z79899 Other long term (current) drug therapy: Secondary | ICD-10-CM | POA: Diagnosis not present

## 2022-05-05 DIAGNOSIS — N390 Urinary tract infection, site not specified: Secondary | ICD-10-CM | POA: Diagnosis not present

## 2022-05-05 DIAGNOSIS — I160 Hypertensive urgency: Secondary | ICD-10-CM | POA: Insufficient documentation

## 2022-05-05 DIAGNOSIS — F03918 Unspecified dementia, unspecified severity, with other behavioral disturbance: Secondary | ICD-10-CM | POA: Diagnosis present

## 2022-05-05 DIAGNOSIS — E785 Hyperlipidemia, unspecified: Secondary | ICD-10-CM

## 2022-05-05 DIAGNOSIS — D61818 Other pancytopenia: Secondary | ICD-10-CM | POA: Insufficient documentation

## 2022-05-05 DIAGNOSIS — I10 Essential (primary) hypertension: Secondary | ICD-10-CM

## 2022-05-05 DIAGNOSIS — E119 Type 2 diabetes mellitus without complications: Secondary | ICD-10-CM | POA: Diagnosis not present

## 2022-05-05 DIAGNOSIS — I639 Cerebral infarction, unspecified: Principal | ICD-10-CM

## 2022-05-05 DIAGNOSIS — N3000 Acute cystitis without hematuria: Secondary | ICD-10-CM

## 2022-05-05 LAB — COMPREHENSIVE METABOLIC PANEL
ALT: 12 U/L (ref 0–44)
AST: 19 U/L (ref 15–41)
Albumin: 3.6 g/dL (ref 3.5–5.0)
Alkaline Phosphatase: 87 U/L (ref 38–126)
Anion gap: 10 (ref 5–15)
BUN: 17 mg/dL (ref 8–23)
CO2: 26 mmol/L (ref 22–32)
Calcium: 9.8 mg/dL (ref 8.9–10.3)
Chloride: 102 mmol/L (ref 98–111)
Creatinine, Ser: 0.66 mg/dL (ref 0.44–1.00)
GFR, Estimated: >60 mL/min (ref >60)
Glucose, Bld: 86 mg/dL (ref 70–99)
Potassium: 4 mmol/L (ref 3.5–5.1)
Sodium: 138 mmol/L (ref 135–145)
Total Bilirubin: 0.5 mg/dL (ref 0.3–1.2)
Total Protein: 6.5 g/dL (ref 6.5–8.1)

## 2022-05-05 LAB — CBC WITH DIFFERENTIAL/PLATELET
Abs Immature Granulocytes: 0.01 10*3/uL (ref 0.00–0.07)
Basophils Absolute: 0 10*3/uL (ref 0.0–0.1)
Basophils Relative: 0 %
Eosinophils Absolute: 0.1 10*3/uL (ref 0.0–0.5)
Eosinophils Relative: 2 %
HCT: 33.7 % — ABNORMAL LOW (ref 36.0–46.0)
Hemoglobin: 10.7 g/dL — ABNORMAL LOW (ref 12.0–15.0)
Immature Granulocytes: 0 %
Lymphocytes Relative: 23 %
Lymphs Abs: 0.7 10*3/uL (ref 0.7–4.0)
MCH: 28.5 pg (ref 26.0–34.0)
MCHC: 31.8 g/dL (ref 30.0–36.0)
MCV: 89.9 fL (ref 80.0–100.0)
Monocytes Absolute: 0.4 10*3/uL (ref 0.1–1.0)
Monocytes Relative: 13 %
Neutro Abs: 1.8 10*3/uL (ref 1.7–7.7)
Neutrophils Relative %: 62 %
Platelets: 218 10*3/uL (ref 150–400)
RBC: 3.75 MIL/uL — ABNORMAL LOW (ref 3.87–5.11)
RDW: 14.3 % (ref 11.5–15.5)
WBC: 3 10*3/uL — ABNORMAL LOW (ref 4.0–10.5)
nRBC: 0 % (ref 0.0–0.2)

## 2022-05-05 LAB — TROPONIN I (HIGH SENSITIVITY)
Troponin I (High Sensitivity): 9 ng/L (ref <18)
Troponin I (High Sensitivity): 8 ng/L (ref <18)

## 2022-05-05 LAB — URINALYSIS, ROUTINE W REFLEX MICROSCOPIC
Bilirubin Urine: NEGATIVE
Glucose, UA: NEGATIVE mg/dL
Hgb urine dipstick: NEGATIVE
Ketones, ur: 5 mg/dL — AB
Nitrite: NEGATIVE
Protein, ur: NEGATIVE mg/dL
Specific Gravity, Urine: 1.025 (ref 1.005–1.030)
pH: 5 (ref 5.0–8.0)

## 2022-05-05 LAB — HEMOGLOBIN A1C
Hgb A1c MFr Bld: 5.3 % (ref 4.8–5.6)
Mean Plasma Glucose: 105.41 mg/dL

## 2022-05-05 LAB — LIPID PANEL
Cholesterol: 202 mg/dL — ABNORMAL HIGH (ref 0–200)
HDL: 94 mg/dL (ref >40)
LDL Cholesterol: 104 mg/dL — ABNORMAL HIGH (ref 0–99)
Total CHOL/HDL Ratio: 2.1 RATIO
Triglycerides: 22 mg/dL (ref <150)
VLDL: 4 mg/dL (ref 0–40)

## 2022-05-05 MED ORDER — BUSPIRONE HCL 10 MG PO TABS
10.0000 mg | ORAL_TABLET | Freq: Three times a day (TID) | ORAL | Status: DC
  Administered 2022-05-05 – 2022-05-08 (×10): 10 mg via ORAL
  Filled 2022-05-05 (×10): qty 1

## 2022-05-05 MED ORDER — ACETAMINOPHEN 160 MG/5ML PO SOLN: 650.0000 mg | ORAL | Status: DC | PRN

## 2022-05-05 MED ORDER — ENOXAPARIN SODIUM 40 MG/0.4ML IJ SOSY
40.0000 mg | PREFILLED_SYRINGE | INTRAMUSCULAR | Status: DC
  Administered 2022-05-05 – 2022-05-08 (×4): 40 mg via SUBCUTANEOUS
  Filled 2022-05-05 (×4): qty 0.4

## 2022-05-05 MED ORDER — SIMVASTATIN 20 MG PO TABS
20.0000 mg | ORAL_TABLET | Freq: Every day | ORAL | Status: DC
  Administered 2022-05-05: 20 mg via ORAL
  Filled 2022-05-05: qty 1

## 2022-05-05 MED ORDER — ACETAMINOPHEN 650 MG RE SUPP: 650.0000 mg | RECTAL | Status: DC | PRN

## 2022-05-05 MED ORDER — STROKE: EARLY STAGES OF RECOVERY BOOK
Freq: Once | Status: AC
  Filled 2022-05-05: qty 1

## 2022-05-05 MED ORDER — SODIUM CHLORIDE 0.9 % IV SOLN: Freq: Once | INTRAVENOUS | Status: AC

## 2022-05-05 MED ORDER — LORAZEPAM 2 MG/ML IJ SOLN
0.5000 mg | Freq: Once | INTRAMUSCULAR | Status: AC
  Administered 2022-05-05: 0.5 mg via INTRAVENOUS
  Filled 2022-05-05: qty 1

## 2022-05-05 MED ORDER — IOHEXOL 350 MG/ML SOLN
75.0000 mL | Freq: Once | INTRAVENOUS | Status: AC | PRN
  Administered 2022-05-05: 75 mL via INTRAVENOUS

## 2022-05-05 MED ORDER — ASPIRIN 81 MG PO CHEW
81.0000 mg | CHEWABLE_TABLET | Freq: Once | ORAL | Status: AC
  Administered 2022-05-05: 81 mg via ORAL
  Filled 2022-05-05: qty 1

## 2022-05-05 MED ORDER — FOSFOMYCIN TROMETHAMINE 3 G PO PACK
3.0000 g | PACK | Freq: Once | ORAL | Status: AC
  Administered 2022-05-05: 3 g via ORAL
  Filled 2022-05-05: qty 3

## 2022-05-05 MED ORDER — HYDROCHLOROTHIAZIDE 12.5 MG PO TABS
12.5000 mg | ORAL_TABLET | Freq: Once | ORAL | Status: AC
  Administered 2022-05-05: 12.5 mg via ORAL
  Filled 2022-05-05: qty 1

## 2022-05-05 MED ORDER — ACETAMINOPHEN 325 MG PO TABS
650.0000 mg | ORAL_TABLET | ORAL | Status: DC | PRN
  Administered 2022-05-05 – 2022-05-08 (×2): 650 mg via ORAL
  Filled 2022-05-05 (×2): qty 2

## 2022-05-05 MED ORDER — SENNOSIDES-DOCUSATE SODIUM 8.6-50 MG PO TABS: 1.0000 | ORAL_TABLET | Freq: Every evening | ORAL | Status: DC | PRN

## 2022-05-05 MED ORDER — MELATONIN 3 MG PO TABS: 3.0000 mg | ORAL_TABLET | Freq: Every evening | ORAL | Status: DC | PRN

## 2022-05-05 MED ORDER — FENTANYL CITRATE PF 50 MCG/ML IJ SOSY
25.0000 ug | PREFILLED_SYRINGE | Freq: Once | INTRAMUSCULAR | Status: AC
  Administered 2022-05-05: 25 ug via INTRAVENOUS
  Filled 2022-05-05: qty 1

## 2022-05-05 MED ORDER — LABETALOL HCL 100 MG PO TABS
50.0000 mg | ORAL_TABLET | Freq: Once | ORAL | Status: AC
  Administered 2022-05-05: 50 mg via ORAL
  Filled 2022-05-05: qty 0.5

## 2022-05-05 MED ORDER — HYDRALAZINE HCL 20 MG/ML IJ SOLN
10.0000 mg | INTRAMUSCULAR | Status: DC | PRN
  Administered 2022-05-06 (×2): 10 mg via INTRAVENOUS
  Filled 2022-05-05 (×3): qty 1

## 2022-05-05 MED ORDER — LORAZEPAM 2 MG/ML IJ SOLN: 0.5000 mg | Freq: Four times a day (QID) | INTRAMUSCULAR | Status: DC | PRN

## 2022-05-05 MED ORDER — HALOPERIDOL LACTATE 5 MG/ML IJ SOLN
2.0000 mg | Freq: Once | INTRAMUSCULAR | Status: AC
  Administered 2022-05-05: 2 mg via INTRAVENOUS
  Filled 2022-05-05: qty 1

## 2022-05-05 MED ORDER — PANTOPRAZOLE SODIUM 40 MG PO TBEC
40.0000 mg | DELAYED_RELEASE_TABLET | Freq: Every day | ORAL | Status: DC
  Administered 2022-05-06 – 2022-05-08 (×3): 40 mg via ORAL
  Filled 2022-05-05 (×3): qty 1

## 2022-05-05 MED ORDER — HYDRALAZINE HCL 20 MG/ML IJ SOLN
10.0000 mg | Freq: Once | INTRAMUSCULAR | Status: DC
Start: 2022-05-05 — End: 2022-05-05

## 2022-05-05 MED ORDER — LABETALOL HCL 5 MG/ML IV SOLN
10.0000 mg | Freq: Once | INTRAVENOUS | Status: AC
  Administered 2022-05-05: 10 mg via INTRAVENOUS
  Filled 2022-05-05: qty 4

## 2022-05-05 MED ORDER — HYDRALAZINE HCL 20 MG/ML IJ SOLN
10.0000 mg | Freq: Once | INTRAMUSCULAR | Status: AC
  Administered 2022-05-05: 10 mg via INTRAVENOUS
  Filled 2022-05-05: qty 1

## 2022-05-05 NOTE — ED Notes (Signed)
Per MD, pt to receive 500 mL total IVF @ 75 mL/hr. Will pass off to next shift to stop IVF around 0015.

## 2022-05-05 NOTE — ED Notes (Signed)
Pt is calling out for help and seems very confused.  Calling out for people who are not there, asking for children and says a doc told her she could go but that is not the case Sundowning suspected.

## 2022-05-05 NOTE — ED Provider Triage Note (Signed)
Emergency Medicine Provider Triage Evaluation Note  Brenda Alexander , a 88 y.o. female  was evaluated in triage.  Pt sent here from nursing home with concerns for facial droop. Patient states that she awoke this morning from a nightmare screaming and nursing staff rushed in and were concerned that the patient had facial droop. Patient denies any symptoms, including headache, weakness, sensation changes, vision changes. She is alert and oriented x 3. She does have bilateral tremors but states this is baseline for her.   Review of Systems  Positive:  Negative:   Physical Exam  BP 118/62   Pulse 67   Temp 97.6 F (36.4 C)   Resp 12   LMP  (LMP Unknown)   SpO2 100%  Gen:   Awake, no distress   Resp:  Normal effort  MSK:   Moves extremities without difficulty  Other:  A&Ox3. No dysarthria or dysphasia. Moving extremities equally with no drift. Sensation intact bilaterally. Vision grossly intact. PERRLA and EOMs intact. No visualized facial droop, facial movements symmetric with sensation intact.   Medical Decision Making  Medically screening exam initiated at 7:39 AM.  Appropriate orders placed.  Yareli Carthen was informed that the remainder of the evaluation will be completed by another provider, this initial triage assessment does not replace that evaluation, and the importance of remaining in the ED until their evaluation is complete.  Patient alert and oriented without complaints or focal deficits. No clearly visualized facial droop, facial movements symmetric. Therefore no activation at this time. Work-up initiated.   Bud Face, PA-C 05/05/22 [phone]

## 2022-05-05 NOTE — H&P (Signed)
History and Physical    Patient: Brenda Alexander EYC:[id number] DOB: 1933/07/14 DOA: 05/05/2022 DOS: the patient was seen and examined on 05/05/2022 PCP: [name], NP  Patient coming from: [facility] via EMS  Chief Complaint:  Chief Complaint  Patient presents with   Facial Droop   HPI: Brenda Alexander is a 88 y.o. female with medical history significant of history of hypertension, hyperlipidemia, CAD, non-insulin-dependent diabetes, dementia, and bedbound who presented for complaints of left-sided facial droop.  Last known normal around 3:30 AM this morning.  History is limited from the patient and she has significant dementia.  Patient states that there is a man trying to kill her.  Patient's daughters are present at bedside and states that she has been saying that for couple months now.  At baseline she is alert and oriented to self, has anxiety for which Ativan was added back recently.  Upon admission into the emergency department patient was seen to have resolution of facial droop.  She was afebrile with pulse 63-125, respirations 12-28, blood pressures elevated up to 191/102.  CT scan of the head showed no acute abnormality.  Labs significant for WBC 3 and hemoglobin 10.7.  Urinalysis noted trace leukocytes with rare bacteria, and 11-20 WBCs.  Chest x-ray was otherwise noted to be clear.  MRI however noted small focus of acute infarction in the right external capsule with microhemorrhage adjacent to the area of infarct.  Patient has been given home blood pressure medications, Haldol for agitation, fentanyl IV for pain, fosfomycin for concern for UTI, and aspirin.   Review of Systems: unable to review all systems due to the inability of the patient to answer questions. 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   History of blood transfusion 01/12/2018   at [facility]   Hyperlipemia    Hypertension    SVD (spontaneous vaginal delivery)    x 6 - 5 living children   Uses walker    rollator   Wears partial dentures    upper and lower partials   Past Surgical History:  Procedure Laterality Date   COLONOSCOPY     EYE SURGERY     cataracts removed right eye   HYSTEROSCOPY WITH D & C N/A 05/06/2018   Procedure: DILATATION AND CURETTAGE /HYSTEROSCOPY;  Surgeon: [name], MD;  Location: [facility];  Service: Gynecology;  Laterality: N/A;   INGUINAL HERNIA REPAIR Left 10/31/2021   Procedure: OPEN REPAIR LEFT INCARCERATED INGUINAL HERNIA;  Surgeon: Greer Pickerel, MD;  Location: [name];  Service: General;  Laterality: Left;   LAPAROSCOPY N/A 08/26/2018   Procedure: LAPAROSCOPY DIAGNOSTIC;  Surgeon: [name], MD;  Location: [facility];  Service: General;  Laterality: N/A;   MULTIPLE TOOTH EXTRACTIONS     ROBOTIC ASSISTED TOTAL HYSTERECTOMY WITH BILATERAL SALPINGO OOPHERECTOMY Bilateral 05/24/2018   Procedure: XI ROBOTIC ASSISTED TOTAL HYSTERECTOMY WITH BILATERAL SALPINGO OOPHORECTOMY;  Surgeon: [name], MD;  Location: [name];  Service: Gynecology;  Laterality: Bilateral;   SENTINEL NODE BIOPSY N/A 05/24/2018   Procedure: SENTINEL LYMP  NODE BIOPSY;  Surgeon: [name], MD;  Location: [name];  Service: Gynecology;  Laterality: N/A;   Social History:  reports that she has  never smoked. She has never used smokeless tobacco. She reports that she does not currently use alcohol. She reports that she does not use drugs.  Allergies  Allergen Reactions   Other Anaphylaxis and Swelling    Walnuts = Throat swells   Penicillins Swelling and Rash    Has patient had a PCN reaction causing immediate rash, facial/tongue/throat swelling, SOB or lightheadedness with hypotension: Yes Has patient had a PCN reaction causing severe rash  involving mucus membranes or skin necrosis: Unk Has patient had a PCN reaction that required hospitalization: Unk Has patient had a PCN reaction occurring within the last 10 years: No If all of the above answers are "NO", then may proceed with Cephalosporin use.     Family History  Problem Relation Age of Onset   Obesity Son    Hypertension Father    Stroke Father    Hypertension Sister    Heart failure Sister    Hypertension Brother    Hypertension Sister    Hypertension Sister     Prior to Admission medications   Medication Sig Start Date End Date Taking? Authorizing Provider  acetaminophen (TYLENOL) 500 MG tablet Take 1,000 mg by mouth every 6 (six) hours as needed (for pain).     [provider]  bisacodyl (DULCOLAX) 10 MG suppository If not relieved by MOM, give 10 mg Bisacodyl suppositiory rectally X 1 dose in 24 hours as needed    [provider]  Calcium Carb-Cholecalciferol (CALCIUM + D3 PO) Take by mouth. 315 mg w/250 IU take 1 tablet po daily    [provider]  cloNIDine (CATAPRES) 0.2 MG tablet Take 0.2 mg by mouth 2 (two) times daily. 08/06/21   [provider]  docusate sodium (COLACE) 100 MG capsule Take 1 capsule (100 mg total) by mouth 2 (two) times daily as needed for mild constipation or moderate constipation. 05/06/18   Anyanwu, Sallyanne Havers, MD  ferrous sulfate 325 (65 FE) MG EC tablet Take 1 tablet (325 mg total) by mouth 2 (two) times daily. 01/13/18 07/28/48  Elgergawy, Silver Huguenin, MD  isosorbide mononitrate (ISMO,MONOKET) 20 MG tablet Take 20 mg by mouth daily. 12/23/17   [provider]  labetalol (NORMODYNE) 100 MG tablet Take 0.5 tablets (50 mg total) by mouth 2 (two) times daily. 11/01/21   Antonieta Pert, MD  Magnesium Hydroxide (MILK OF MAGNESIA PO) If no BM in 3 days, give 30 cc Milk of Magnesium p.o. x 1 dose in 24 hours as needed    [provider]  melatonin 3 MG TABS tablet Take 1 tablet (3 mg total) by mouth at  bedtime as needed. 11/04/21   Antonieta Pert, MD  Multiple Vitamin (MULTIVITAMIN) tablet Take 1 tablet by mouth daily.    [provider]  Nutritional Supplements (ENSURE ORIGINAL) LIQD Take by mouth. DRINK 1 BOTTLE BY MOUTH AT BEDTIME FOR SUPPLEMENT    [provider]  omeprazole (PRILOSEC) 20 MG capsule Take 20 mg by mouth daily.    [provider]  senna (SENOKOT) 8.6 MG TABS tablet Take 8.6 mg by mouth daily as needed for mild constipation.    [provider]  simvastatin (ZOCOR) 20 MG tablet Take 20 mg by mouth at bedtime.    [provider]  Sodium Phosphates (RA SALINE ENEMA RE) If not relieved by Biscodyl suppository, give disposable Saline Enema rectally X 1 dose/24 hrs as needed    [provider]  spironolactone (ALDACTONE) 25 MG tablet Take  0.5 tablets (12.5 mg total) by mouth daily. 10/16/21   Patwardhan, Reynold Bowen, MD  vitamin C (ASCORBIC ACID) 500 MG tablet Take 500 mg by mouth daily.    [provider]  furosemide (LASIX) 40 MG tablet Take 40 mg by mouth as needed.  08/06/20  [provider]    Physical Exam: Vitals:   05/05/22 1541 05/05/22 1544 05/05/22 1600 05/05/22 1645  BP: (!) 171/98 (!) 174/99 (!) 202/92 (!) 191/102  Pulse: (!) 124 (!) 125 85 88  Resp: (!) 27 (!) 26 (!) 23 (!) 25  Temp:      TempSrc:      SpO2: 96% 98% 98% 97%    Constitutional: Elderly female who appears to be Eyes: PERRL, lids and conjunctivae normal ENMT: Mucous membranes are moist.   Neck: normal, supple  Respiratory: clear to auscultation bilaterally, no wheezing, no crackles.  O2 saturations currently maintained on room air Cardiovascular: Regular rate and rhythm, no murmurs / rubs / gallops.  At least +1 pitting lower extremity edema. 2+ pedal pulses. No carotid bruits.  Abdomen: no tenderness, no masses palpated.   Bowel sounds positive.  Musculoskeletal: no clubbing / cyanosis.  Decreased range of motion of the bilateral  upper extremities. Skin: no rashes, lesions, ulcers. No induration Neurologic: CN 2-12 grossly intact.  Strength 4/5 in the bilateral upper extremities.  Strength   2-3/5 in the lower extremities.  Tremor present of the right hand. Psychiatric: Alert and oriented to person able to recognize daughters present at bedside, but not oriented to place and time.  Anxious mood.  Data Reviewed:  Junctional rhythm at 64 bpm with left anterior fascicular block.  Reviewed labs, imaging, and pertinent records as noted above in HPI.  Assessment and Plan: CVA Acute.  Patient presented for left-sided facial droop which had resolved by time patient got to the emergency room.  Patient not a candidate for TNKase. MRI small focus of acute infarction in the right external capsule with microhemorrhage adjacent to the area of infarct. -Admit to a telemetry bed -Stroke order set utilized -Neurochecks -Add on lipid panel and hemoglobin A1c -Check CT angiogram of the head and neck -Check echocardiogram -PT/OT/speech to evaluate and treat -Aspirin -Appreciate neurology consultative services we will follow-up for further recommendations  Dementia with behavioral disturbance At baseline patient is usually alert and oriented to person and is able to recognize family members like her daughters at bedside. -Delirium precautions -Bed alarm on -Ativan IV prn  Hypertensive urgency On admission blood pressures elevated up to 205/92.  Home blood pressure regimen includes clonidine 0.2 mg twice daily, isosorbide mononitrate 20 mg daily, labetalol 50 mg twice daily, and spironolactone 0.125. -Permissive hypertension in the setting of acute stroke  Pancytopenia Chronic.  WBC 3 and hemoglobin 10.7 which appears similar to prior. -Continue to monitor  Hyperlipidemia -Follow-up lipid panel -Continue simvastatin. Adjust medication if needed  Possible urinary tract infection Urinalysis noted trace leukocytes with rare  bacteria, and 11-20 WBCs.  Patient had been given 3 g of fosfomycin in ED.  Bedbound/debility    DVT prophylaxis: lovenox Advance Care Planning:   Code Status: DNR    Consults: Neurology  Family Communication: Patient's daughter was updated at bedside  Severity of Illness: The appropriate patient status for this patient is OBSERVATION. Observation status is judged to be reasonable and necessary in order to provide the required intensity of service to ensure the patient's safety. The patient's presenting symptoms, physical exam findings, and initial  radiographic and laboratory data in the context of their medical condition is felt to place them at decreased risk for further clinical deterioration. Furthermore, it is anticipated that the patient will be medically stable for discharge from the hospital within 2 midnights of admission.   Author: Norval Morton, MD 05/05/2022 5:05 PM  For on call review [email or web address].

## 2022-05-05 NOTE — ED Notes (Signed)
Patient states she had a dream that she was shot and got upset

## 2022-05-05 NOTE — ED Notes (Signed)
Patient transported to MRI 

## 2022-05-05 NOTE — Consult Note (Addendum)
Neurology Consultation  Reason for Consult: MRI brain with small acute infarction in the right external capsule Referring Physician: Dr. Nechama Guard  CC: Patient is confused, does not provide chief complaint  History is obtained from: Chart review, patient's daughter via telephone  HPI: Brenda Alexander is a 88 y.o. female with a medical history significant for coronary artery disease, type 2 diabetes mellitus, hyperlipidemia, essential hypertension, and anemia who presented to the ED on 05/05/2022 for evaluation of acute left-sided facial drooping per living facility.  Is also some concern for hypertension with a systolic blood pressure in the 170s.  Patient's reported last known well was 03:30AM this morning. On arrival to the ED, there was no facial drooping visualized.  An MRI brain was obtained revealing an acute small infarct in the right internal capsule and neurology was consulted for further evaluation.  At her current baseline, baseline patient is bedbound and wheelchair-bound due to bilateral lower extremity weakness.  Staff at the facility states that she has been at her baseline mentally and physically recently without fevers, illness, or complaint.  Of note, patient's daughter states that in March 2023, patient was living independently with caregiver support about 3 hours daily.  She was ambulating with a rollator at home.  Patient's daughter states that she did intermittently have confusion and delirium at home.  Patient's sister had early onset dementia but passed away 1 month ago.  [name] began to have frequent falls in March 2023 and after evaluation at [facility], patient decided that she would go to a rehab facility short-term for physical therapy.  Prior to discharge, patient was found to have an incarcerated left inguinal hernia and underwent repair on 10/31/2021 and was then sent to skilled nursing facility for rehab.  Since admission at rehab, patient has had multiple ED visits for  syncope and complications with hypertension.  Patient's daughter notes that since her admission to the facility, she was initially started in physical therapy but that has since stopped.  There has been a pretty drastic decline in her physical status since being admitted to the facility.  She is stiff throughout, confused, paranoid, and wheelchair-bound/bedbound.  The daughter believes that physical therapy was stopped because the patient was not making progress.  ROS: Unable to obtain due to altered mental status.   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   History of blood transfusion 01/12/2018   at [facility]   Hyperlipemia    Hypertension    SVD (spontaneous vaginal delivery)    x 6 - 5 living children   Uses walker    rollator   Wears partial dentures    upper and lower partials   Past Surgical History:  Procedure Laterality Date   COLONOSCOPY     EYE SURGERY     cataracts removed right eye   HYSTEROSCOPY WITH D & C N/A 05/06/2018   Procedure: DILATATION AND CURETTAGE /HYSTEROSCOPY;  Surgeon: [name], MD;  Location: [facility];  Service: Gynecology;  Laterality: N/A;   INGUINAL HERNIA REPAIR Left 10/31/2021   Procedure: OPEN REPAIR LEFT INCARCERATED INGUINAL HERNIA;  Surgeon: Greer Pickerel, MD;  Location: [name];  Service: General;  Laterality: Left;  LAPAROSCOPY N/A 08/26/2018   Procedure: LAPAROSCOPY DIAGNOSTIC;  Surgeon: [name], MD;  Location: [facility];  Service: General;  Laterality: N/A;   MULTIPLE TOOTH EXTRACTIONS     ROBOTIC ASSISTED TOTAL HYSTERECTOMY WITH BILATERAL SALPINGO OOPHERECTOMY Bilateral 05/24/2018   Procedure: XI ROBOTIC ASSISTED TOTAL HYSTERECTOMY WITH BILATERAL SALPINGO OOPHORECTOMY;   Surgeon: [name], MD;  Location: [name];  Service: Gynecology;  Laterality: Bilateral;   SENTINEL NODE BIOPSY N/A 05/24/2018   Procedure: SENTINEL LYMP  NODE BIOPSY;  Surgeon: [name], MD;  Location: [name];  Service: Gynecology;  Laterality: N/A;   Family History  Problem Relation Age of Onset   Obesity Son    Hypertension Father    Stroke Father    Hypertension Sister    Heart failure Sister    Hypertension Brother    Hypertension Sister    Hypertension Sister    Social History:   reports that she has never smoked. She has never used smokeless tobacco. She reports that she does not currently use alcohol. She reports that she does not use drugs.  Medications  Current Facility-Administered Medications:    aspirin chewable tablet 81 mg, 81 mg, Oral, Once, Elgie Congo, MD  Current Outpatient Medications:    acetaminophen (TYLENOL) 500 MG tablet, Take 1,000 mg by mouth every 6 (six) hours as needed (for pain). , Disp: , Rfl:    bisacodyl (DULCOLAX) 10 MG suppository, If not relieved by MOM, give 10 mg Bisacodyl suppositiory rectally X 1 dose in 24 hours as needed, Disp: , Rfl:    Calcium Carb-Cholecalciferol (CALCIUM + D3 PO), Take by mouth. 315 mg w/250 IU take 1 tablet po daily, Disp: , Rfl:    cloNIDine (CATAPRES) 0.2 MG tablet, Take 0.2 mg by mouth 2 (two) times daily., Disp: , Rfl:    docusate sodium (COLACE) 100 MG capsule, Take 1 capsule (100 mg total) by mouth 2 (two) times daily as needed for mild constipation or moderate constipation., Disp: 30 capsule, Rfl: 2   ferrous sulfate 325 (65 FE) MG EC tablet, Take 1 tablet (325 mg total) by mouth 2 (two) times daily., Disp: 60 tablet, Rfl: 3   isosorbide mononitrate (ISMO,MONOKET) 20 MG tablet, Take 20 mg by mouth daily., Disp: , Rfl: 4   labetalol (NORMODYNE) 100 MG tablet, Take 0.5 tablets (50 mg total) by mouth 2 (two) times daily., Disp: , Rfl:    Magnesium Hydroxide (MILK OF MAGNESIA PO), If no BM in 3 days, give  30 cc Milk of Magnesium p.o. x 1 dose in 24 hours as needed, Disp: , Rfl:    melatonin 3 MG TABS tablet, Take 1 tablet (3 mg total) by mouth at bedtime as needed., Disp: , Rfl: 0   Multiple Vitamin (MULTIVITAMIN) tablet, Take 1 tablet by mouth daily., Disp: , Rfl:    Nutritional Supplements (ENSURE ORIGINAL) LIQD, Take by mouth. DRINK 1 BOTTLE BY MOUTH AT BEDTIME FOR SUPPLEMENT, Disp: , Rfl:    omeprazole (PRILOSEC) 20 MG capsule, Take 20 mg by mouth daily., Disp: , Rfl:    senna (SENOKOT) 8.6 MG TABS tablet, Take 8.6 mg by mouth daily as needed for mild constipation., Disp: , Rfl:    simvastatin (ZOCOR) 20 MG tablet, Take 20 mg by mouth at bedtime., Disp: , Rfl:    Sodium Phosphates (RA SALINE ENEMA RE), If not relieved by Biscodyl suppository, give disposable Saline Enema rectally X 1 dose/24 hrs as needed, Disp: , Rfl:    spironolactone (ALDACTONE)  25 MG tablet, Take 0.5 tablets (12.5 mg total) by mouth daily., Disp: 90 tablet, Rfl: 2   vitamin C (ASCORBIC ACID) 500 MG tablet, Take 500 mg by mouth daily., Disp: , Rfl:   Exam: Current vital signs: BP (!) 174/99   Pulse (!) 125   Temp 98 F (36.7 C) (Rectal)   Resp (!) 26   LMP  (LMP Unknown)   SpO2 98%  Vital signs in last 24 hours: Temp:  [97.6 F (36.4 C)-98.4 F (36.9 C)] 98 F (36.7 C) (10/09 1330) Pulse Rate:  [63-125] 125 (10/09 1544) Resp:  [12-28] 26 (10/09 1544) BP: (118-208)/(62-176) 174/99 (10/09 1544) SpO2:  [96 %-100 %] 98 % (10/09 1544)  GENERAL: Awake, alert, confused Psych: Patient is confused, paranoid, yelling out in the ER. She is cooperative and briefly redirectable during exam.  Head: Normocephalic and atraumatic, without obvious abnormality EENT: Normal conjunctivae, dry mucous membranes, wears eyeglasses.  LUNGS: Normal respiratory effort. Non-labored breathing on room air CV: Regular rate and rhythm on telemetry ABDOMEN: Soft, non-tender, non-distended Extremities: Warm, well perfused. Bilateral hand  edema noted.   NEURO:  Mental Status: Awake, alert. Oriented to self. She incorrectly states that she is 88 years old. She is unable to state the correct month or current location. When asked the month patient states "she is the one that keeps my money for me" Patient is paranoid and fixated on being afraid of being shot and that somebody is after her. When asked to repeat phrases she states that she cannot.  When asked again she is able to repeat short phrases without error.  Naming is intact.  She is not dysarthric. She requires coaching to follow commands.  She intermittently follows simple commands. No neglect is noted On later attending evaluation and in the presence of family she is calmer after having received some Ativan.  She is able to state that she is 88 years old (close to her actual age of 36), able to identify her daughters at bedside and following simple commands  Cranial Nerves:  II: PERRL. visual fields full.  III, IV, VI: EOMI without gaze preference, nystagmus V: Reports decrease sensation to the left face VII: Face appears to have right asymmetry at rest but there is symmetric elevation with smiling VIII: Hearing is intact to voice IX, X: Phonation normal.  XI: Head is grossly midline XII: Tongue protrudes midline Motor: Significantly limited range of motion throughout. Intentional coarse tremor noted in bilateral upper extremities.  Patient is able to minimally elevate bilateral upper extremities due to limited ROM. Left lower extremity ROM appears to be more limited than right lower extremity.  Right lower extremity with minimal antigravity movement.  Left lower extremity not antigravity. Tone is increased throughout. Bulk is normal.  Sensation: Decrease sensation to light touch reported to the left upper and lower extremity Coordination: Does not perform due to limited ROM Gait: Deferred for patient's safety  NIHSS: 12 -- 2 points for not answering questions  correctly, one-point for drift in left upper extremity, one-point for drift in the right upper extremity, 3 points for drift of the left leg, 2 points for drift in the right leg, one-point for sensory loss on the left side, one-point for mild aphasia versus poor attention, and one-point for dysarthria  Labs I have reviewed labs in epic and the results pertinent to this consultation are: CBC    Component Value Date/Time   WBC 3.0 (L) 05/05/2022 0742   RBC 3.75 (L)  05/05/2022 0742   HGB 10.7 (L) 05/05/2022 0742   HGB 8.4 (L) 04/07/2018 0945   HCT 33.7 (L) 05/05/2022 0742   HCT 26.8 (L) 04/07/2018 0945   PLT 218 05/05/2022 0742   PLT 282 04/07/2018 0945   MCV 89.9 05/05/2022 0742   MCV 84 04/07/2018 0945   MCH 28.5 05/05/2022 0742   MCHC 31.8 05/05/2022 0742   RDW 14.3 05/05/2022 0742   RDW 15.4 04/07/2018 0945   LYMPHSABS 0.7 05/05/2022 0742   MONOABS 0.4 05/05/2022 0742   EOSABS 0.1 05/05/2022 0742   BASOSABS 0.0 05/05/2022 0742   CMP     Component Value Date/Time   NA 138 05/05/2022 0742   NA 141 04/18/2021 1520   K 4.0 05/05/2022 0742   CL 102 05/05/2022 0742   CO2 26 05/05/2022 0742   GLUCOSE 86 05/05/2022 0742   BUN 17 05/05/2022 0742   BUN 17 04/18/2021 1520   CREATININE 0.66 05/05/2022 0742   CALCIUM 9.8 05/05/2022 0742   PROT 6.5 05/05/2022 0742   ALBUMIN 3.6 05/05/2022 0742   AST 19 05/05/2022 0742   ALT 12 05/05/2022 0742   ALKPHOS 87 05/05/2022 0742   BILITOT 0.5 05/05/2022 0742   GFRNONAA >60 05/05/2022 0742   GFRAA 48 (L) 08/20/2020 1417   Lipid Panel  No results found for: "CHOL", "TRIG", "HDL", "CHOLHDL", "VLDL", "LDLCALC", "LDLDIRECT" Lab Results  Component Value Date   HGBA1C 5.0 05/19/2018   Imaging I have reviewed the images obtained:  CT-scan of the brain 10/9: 1. No acute intracranial process. 2. Atrophy and chronic microvascular ischemic changes.  MRI examination of the brain 10/9: 1. Small focus of acute infarct in the right external  capsule. There is a focus of microhemorrhage adjacent to this area of infarct, but not definitively associated with the infarct. 2. Small nodular focus along the posterior margin of the left ventricular atrium is nonspecific and may represent a site of gray matter heterotopia; recommend comparison with prior imaging.  Assessment: 88 year old female with PMHx of HTN, HLD, CAD, DM2, anemia, and acute cognitive and physical decline since April 2023 when she went to skilled nursing facility for short-term rehab who presents to the ED on 05/05/2022 for evaluation of resistant hypertension and facility concern for left facial droop.  There was no facial droop present on hospital arrival. -Examination reveals patient with significantly limited ROM throughout each extremity. Does not appear to have focal weakness.  She does complain of decreased sensation to the left face, arm, leg compared to the right.  Impaired attention, confused, disoriented.  No facial asymmetry. -CT imaging is without acute intracranial process.  MRI imaging with small focus of acute infarct in the right external capsule.  There is a focus of microhemorrhage adjacent to this area of infarct, but this felt not to be associated with the infarction.  Unclear chronicity of microhemorrhage. -We will complete stroke work-up. -Stroke risk factors include patient's advanced age, HTN, CAD, DM2, HLD. -Regarding patient's history, there seems to be a significant physical decline over the past 6 months since her admission to the skilled nursing facility in April.  Prior to April, patient lived independently.  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 Note/plan to be edited by MD as needed.  Anibal Henderson, AGAC-NP [facility] Pager: [phone]  Attending Neurologist's note:  I personally saw this patient, gathering history, performing a full neurologic examination, reviewing relevant labs, personally reviewing relevant imaging including head CT and MRI, and formulated the assessment and plan, adding the note above for completeness and clarity to accurately reflect my thoughts   Lesleigh Noe MD-PhD [facility] [phone]

## 2022-05-05 NOTE — ED Provider Notes (Signed)
[facility] EMERGENCY DEPARTMENT Provider Note   CSN: [id number] Arrival date & time: 05/05/22  8341     History  Chief Complaint  Patient presents with   Facial Droop    Brenda Alexander is a 88 y.o. female.  With PMH of CAD, HTN, HLD, anemia, DM who presents from [facility] with reported left facial drooping and high blood pressure SBP 170s. LKN 3:30 AM.    Patient is poor historian due to underlying history of dementia.  She told me she was scared because she woke up from a dream thinking she was being shot.  However she is denying any pain, no chest pain, no shortness of breath, no recent illness or other complaints.  Spoke with nurse Debrisa who helps take care of patient at her facility.  She said that at baseline she is bedbound and gets around in a wheelchair due to bilateral lower extremity weakness.  She was not there this morning but noted that the call was for a new left-sided facial droop.  She has no history of stroke.  She was also noted to be very hypertensive although she is not typically as hypertensive as she was at the facility this morning.  She is otherwise been at her baseline both mentally and physically.  She has had no recent fevers or illness or complaints otherwise. There were no falls as she is bedbound.  HPI     Home Medications Prior to Admission medications   Medication Sig Start Date End Date Taking? Authorizing Provider  acetaminophen (TYLENOL) 500 MG tablet Take 1,000 mg by mouth every 6 (six) hours as needed (for pain).     [provider]  bisacodyl (DULCOLAX) 10 MG suppository If not relieved by MOM, give 10 mg Bisacodyl suppositiory rectally X 1 dose in 24 hours as needed    [provider]  Calcium Carb-Cholecalciferol (CALCIUM + D3 PO) Take by mouth. 315 mg w/250 IU take 1 tablet po daily    [provider]  cloNIDine (CATAPRES) 0.2 MG tablet Take 0.2 mg by mouth 2 (two) times daily. 08/06/21   [provider]  docusate sodium (COLACE) 100 MG capsule Take 1 capsule (100 mg total) by mouth 2 (two) times daily as needed for mild constipation or moderate constipation. 05/06/18   Anyanwu, Sallyanne Havers, MD  ferrous sulfate 325 (65 FE) MG EC tablet Take 1 tablet (325 mg total) by mouth 2 (two) times daily. 01/13/18 07/28/48  Elgergawy, Silver Huguenin, MD  isosorbide mononitrate (ISMO,MONOKET) 20 MG tablet Take 20 mg by mouth daily. 12/23/17   [provider]  labetalol (NORMODYNE) 100 MG tablet Take 0.5 tablets (50 mg total) by mouth 2 (two) times daily. 11/01/21   Antonieta Pert, MD  Magnesium Hydroxide (MILK OF MAGNESIA PO) If no BM in 3 days, give 30 cc Milk of Magnesium p.o. x 1 dose in 24 hours as needed    [provider]  melatonin 3 MG TABS tablet Take 1 tablet (3 mg total) by mouth at bedtime as needed. 11/04/21   Antonieta Pert, MD  Multiple Vitamin (MULTIVITAMIN) tablet Take 1 tablet by mouth daily.    [provider]  Nutritional Supplements (ENSURE ORIGINAL) LIQD Take by mouth. DRINK 1 BOTTLE BY MOUTH AT BEDTIME FOR SUPPLEMENT    [provider]  omeprazole (PRILOSEC) 20 MG capsule Take 20 mg by mouth daily.    [provider]  senna (SENOKOT) 8.6 MG TABS tablet Take 8.6 mg  by mouth daily as needed for mild constipation.    [provider]  simvastatin (ZOCOR) 20 MG tablet Take 20 mg by mouth at bedtime.    [provider]  Sodium Phosphates (RA SALINE ENEMA RE) If not relieved by Biscodyl suppository, give disposable Saline Enema rectally X 1 dose/24 hrs as needed    [provider]  spironolactone (ALDACTONE) 25 MG tablet Take 0.5 tablets (12.5 mg total) by mouth daily. 10/16/21   Patwardhan, Reynold Bowen, MD  vitamin C (ASCORBIC ACID) 500 MG tablet Take 500 mg by mouth daily.    [provider]  furosemide (LASIX) 40 MG tablet Take 40 mg by mouth as needed.  08/06/20  [provider]      Allergies    Other and  Penicillins    Review of Systems   Review of Systems  Physical Exam Updated Vital Signs BP (!) 202/92   Pulse 85   Temp 98 F (36.7 C) (Rectal)   Resp (!) 23   LMP  (LMP Unknown)   SpO2 98%  Physical Exam Constitutional: Alert and oriented to person, place, not situation or year. Nontoxic. Eyes: Conjunctivae are normal. Bilateral ptosis, EOMI ENT      Head: Normocephalic and atraumatic.      Nose: No congestion.      Mouth/Throat: Mucous membranes are moist.      Neck: No stridor. Cardiovascular: S1, S2,  equal palpable radial pulses. Warm and well perfused. Respiratory: Normal respiratory effort. Breath sounds are normal. O2 sat 100 on RA Gastrointestinal: Soft  Musculoskeletal: trace bilateral LE pitting edema, nontender Neurologic: Normal speech and language. Bilateral ptosis but good facial strength without droop. Sensation face and extremities bilaterally grossly intact. Tongue midline. EOMI, PERRL. Bilateral equal grip strength. No movement of B/l LE against gravity. Resting bilateral upper extremity tremor. Skin: Skin is warm, dry  Psychiatric: Mood and affect are normal. Speech and behavior are normal.  ED Results / Procedures / Treatments   Labs (all labs ordered are listed, but only abnormal results are displayed) Labs Reviewed  CBC WITH DIFFERENTIAL/PLATELET - Abnormal; Notable for the following components:      Result Value   WBC 3.0 (*)    RBC 3.75 (*)    Hemoglobin 10.7 (*)    HCT 33.7 (*)    All other components within normal limits  URINALYSIS, ROUTINE W REFLEX MICROSCOPIC - Abnormal; Notable for the following components:   APPearance HAZY (*)    Ketones, ur 5 (*)    Leukocytes,Ua TRACE (*)    Bacteria, UA RARE (*)    All other components within normal limits  COMPREHENSIVE METABOLIC PANEL  TROPONIN I (HIGH SENSITIVITY)  TROPONIN I (HIGH SENSITIVITY)    EKG EKG Interpretation  Date/Time:  Monday May 05 2022 08:50:46 EDT Ventricular Rate:   64 PR Interval:    QRS Duration: 93 QT Interval:  439 QTC Calculation: 453 R Axis:   -47 Text Interpretation: Junctional rhythm Left anterior fascicular block Low voltage, precordial leads Nonspecific T wave inversion lead aVL.  Left axis deviation. Confirmed by Georgina Snell [phone]) on 05/05/2022 12:01:14 PM  Radiology MR BRAIN WO CONTRAST  Result Date: 05/05/2022 CLINICAL DATA:  Left facial droop EXAM: MRI HEAD WITHOUT CONTRAST TECHNIQUE: Multiplanar, multiecho pulse sequences of the brain and surrounding structures were obtained without intravenous contrast. COMPARISON:  Same day CT brain FINDINGS: Brain: Smallacute infarct in the right external capsule (series 3, image 20). There is a focus of  punctate microhemorrhage adjacent to this. With an associated small volume blood products. Small nodular focus along the posterior margin of the left ventricular atrium (series 6, image 16/series 3, image 30) is nonspecific and may represent a site ovary matter heterotopia. No hydrocpehalus. No extra axial fluid collection.Sequela of severe chronic microvascular ischemic change. Generalized volume loss. Partially empty sella Vascular: Normal flow voids. Skull and upper cervical spine: Normal marrow signal.There is likely at least moderate stenosis at the craniocervical junction secondary to a degenerative panus. Sinuses/Orbits: Bilateral lens replacement. Mucosal thickening right maxillary sinus. Other: None IMPRESSION: 1. Small focus of acute infarct in the right external capsule. There is a focus of microhemorrhage adjacent to this area of infarct, but not definitively associated with the infarct. 2. Small nodular focus along the posterior margin of the left ventricular atrium is nonspecific and may represent a site of gray matter heterotopia; recommend comparison with prior imaging. Electronically Signed   By: Marin Roberts M.D.   On: 05/05/2022 13:28   DG Chest Portable 1 View  Result Date:  05/05/2022 CLINICAL DATA:  Hypertension. EXAM: PORTABLE CHEST 1 VIEW COMPARISON:  Chest x-ray 02/01/2022. FINDINGS: Cardiomediastinal silhouette is mildly enlarged, probably similar to prior when accounting for differences in technique. No consolidation. No visible pleural effusions or pneumothorax. No acute osseous abnormality. IMPRESSION: No evidence of acute cardiopulmonary disease. Electronically Signed   By: Margaretha Sheffield M.D.   On: 05/05/2022 12:15   CT Head Wo Contrast  Result Date: 05/05/2022 CLINICAL DATA:  Facial droop EXAM: CT HEAD WITHOUT CONTRAST TECHNIQUE: Contiguous axial images were obtained from the base of the skull through the vertex without intravenous contrast. RADIATION DOSE REDUCTION: This exam was performed according to the departmental dose-optimization program which includes automated exposure control, adjustment of the mA and/or kV according to patient size and/or use of iterative reconstruction technique. COMPARISON:  CT head February 01, 2022 FINDINGS: Brain: Ventricles and sulci are prominent compatible with atrophy. Periventricular and subcortical white matter hypodensities compatible with chronic microvascular ischemic changes. No evidence for acute cortically based infarct, intracranial hemorrhage, mass lesion or mass-effect. Vascular: No hyperdense vessel or unexpected calcification. Skull: Normal. Negative for fracture or focal lesion. Sinuses/Orbits: Paranasal sinuses are well aerated. Mucosal thickening involving the right maxillary sinus. Mastoid air cells are unremarkable. Other: None. IMPRESSION: 1. No acute intracranial process. 2. Atrophy and chronic microvascular ischemic changes. Electronically Signed   By: Lovey Newcomer M.D.   On: 05/05/2022 09:24    Procedures .Critical Care  Performed by: Elgie Congo, MD Authorized by: Elgie Congo, MD   Critical care provider statement:    Critical care time (minutes):  35   Critical care was necessary to treat  or prevent imminent or life-threatening deterioration of the following conditions:  CNS failure or compromise (HTN crisis)   Critical care was time spent personally by me on the following activities:  Development of treatment plan with patient or surrogate, discussions with consultants, evaluation of patient's response to treatment, examination of patient, ordering and review of laboratory studies, ordering and review of radiographic studies, ordering and performing treatments and interventions, pulse oximetry, re-evaluation of patient's condition, review of old charts and obtaining history from patient or surrogate   Care discussed with: admitting provider     Remain on constant cardiac monitoring, normal sinus rhythm.  Medications Ordered in ED Medications  aspirin chewable tablet 81 mg (has no administration in time range)  fosfomycin (MONUROL) packet 3 g (has no administration in  time range)  hydrochlorothiazide (HYDRODIURIL) tablet 12.5 mg (12.5 mg Oral Given 05/05/22 1042)  labetalol (NORMODYNE) tablet 50 mg (50 mg Oral Given 05/05/22 1042)  hydrALAZINE (APRESOLINE) injection 10 mg (10 mg Intravenous Given 05/05/22 1407)  haloperidol lactate (HALDOL) injection 2 mg (2 mg Intravenous Given 05/05/22 1433)  labetalol (NORMODYNE) injection 10 mg (10 mg Intravenous Given 05/05/22 1545)  fentaNYL (SUBLIMAZE) injection 25 mcg (25 mcg Intravenous Given 05/05/22 1541)    ED Course/ Medical Decision Making/ A&P Clinical Course as of 05/05/22 1633  Mon May 05, 2022  0925 Independent interpretation of CT head shows no ICH [VB]  1520 Discussed case with on-call neurology Dr Curly Shores who said they will be down to evaluate the patient and put in their formal recommendations.   [VB]  31 Per Dr Curly Shores, 81 mg ASA by mouth if she can pass swallow eval and permissive hypertension to 180/90 due to punctate microhemorrhage though that of unclear chronicity at this time.  [VB]  1632 Signed out to Dr. Doren Custard pending  admission to hospitalist. [VB]    Clinical Course User Index [VB] Elgie Congo, MD                           Medical Decision Making  Alexi Dorminey is a 88 y.o. female.  With PMH of CAD, HTN, HLD, anemia, DM who presents from [facility] with reported left facial drooping and high blood pressure SBP 170s. LKN 3:30 AM.   Patient presented with bilateral weakness of lower extremities and bilateral ptosis as well as resting tremor but no obvious facial droop or new focal deficit. Previous documentation from her internal medicine clinic even from April of this year shows baseline has bilateral ptosis as well as resting tremor.  Her presentation represented possible hypertensive encephalopathy versus TIA versus CVA versus toxic metabolic issues such as acute electrolyte abnormality or infection although less likely as she is afebrile.  Her EKG looks similar to prior with no acute ST/T changes suggestive of ischemia with reassuring high sensitive troponin 9, no concern for atypical ACS or NSTEMI.  Her labs showed no acute electrolyte abnormalities with normal creatinine 0.66.  Glucose 86.  She had mild leukopenia 3.0 and stable anemia hemoglobin 10.7 with a UA that showed trace leukocyte Estrace with 11-20 WBCs and rare bacteria with 0-5 squames.  We will put in fosfomycin for possible associated UTI.  Chest x-ray obtained with no pulmonary edema, no pleural effusion, no pneumonia.  Due to unclear last known normal, no code stroke was called as well as no focal obvious deficits on exam.  She had a CT head with no ICH but evidence of acute right external capsule infarct with associated microhemorrhage.  Discussed with Dr. Curly Shores of neurology who recommends permissive hypertension 180/90 as well as 81 mg aspirin by mouth with continued stroke work-up and admission to hospitalist.  She has required intermittent doses of IV antihypertensives for blood pressure control.  Amount and/or Complexity of Data  Reviewed Labs: ordered. Radiology: ordered.  Risk OTC drugs. Prescription drug management.    Final Clinical Impression(s) / ED Diagnoses Final diagnoses:  Facial droop  Hypertension, unspecified type  Cerebrovascular accident (CVA), unspecified mechanism ([place])  Urinary tract infection without hematuria, site unspecified    Rx / DC Orders ED Discharge Orders     None         Elgie Congo, MD 05/05/22 [phone]

## 2022-05-05 NOTE — ED Triage Notes (Addendum)
Patient BIB [facility] from [facility] for evaluation of facial droop with LKW 0330 this morning. Reported left eye drooping, no vision changes, baseline tremor, history of dementia, no arm drift, bilateral grip weakness. No facial droop noted on arrival to ED.

## 2022-05-06 ENCOUNTER — Observation Stay (HOSPITAL_BASED_OUTPATIENT_CLINIC_OR_DEPARTMENT_OTHER): Payer: Medicare HMO

## 2022-05-06 DIAGNOSIS — F03918 Unspecified dementia, unspecified severity, with other behavioral disturbance: Secondary | ICD-10-CM | POA: Diagnosis not present

## 2022-05-06 DIAGNOSIS — I160 Hypertensive urgency: Secondary | ICD-10-CM | POA: Diagnosis not present

## 2022-05-06 DIAGNOSIS — N3000 Acute cystitis without hematuria: Secondary | ICD-10-CM | POA: Diagnosis not present

## 2022-05-06 DIAGNOSIS — I6389 Other cerebral infarction: Secondary | ICD-10-CM | POA: Diagnosis not present

## 2022-05-06 DIAGNOSIS — I639 Cerebral infarction, unspecified: Secondary | ICD-10-CM | POA: Diagnosis not present

## 2022-05-06 LAB — ECHOCARDIOGRAM COMPLETE
AR max vel: 2.02 cm2
AV Area VTI: 1.95 cm2
AV Area mean vel: 1.97 cm2
AV Mean grad: 10 mmHg
AV Peak grad: 18.5 mmHg
Ao pk vel: 2.15 m/s
MV VTI: 1.83 cm2
P 1/2 time: 412 msec
S' Lateral: 2.4 cm

## 2022-05-06 LAB — CBC
HCT: 32.5 % — ABNORMAL LOW (ref 36.0–46.0)
Hemoglobin: 11.1 g/dL — ABNORMAL LOW (ref 12.0–15.0)
MCH: 28.9 pg (ref 26.0–34.0)
MCHC: 34.2 g/dL (ref 30.0–36.0)
MCV: 84.6 fL (ref 80.0–100.0)
Platelets: 217 10*3/uL (ref 150–400)
RBC: 3.84 MIL/uL — ABNORMAL LOW (ref 3.87–5.11)
RDW: 14.4 % (ref 11.5–15.5)
WBC: 4.5 10*3/uL (ref 4.0–10.5)
nRBC: 0 % (ref 0.0–0.2)

## 2022-05-06 MED ORDER — LABETALOL HCL 100 MG PO TABS
50.0000 mg | ORAL_TABLET | Freq: Two times a day (BID) | ORAL | Status: DC
  Administered 2022-05-06 – 2022-05-08 (×6): 50 mg via ORAL
  Filled 2022-05-06 (×7): qty 1

## 2022-05-06 MED ORDER — ASPIRIN 81 MG PO CHEW
81.0000 mg | CHEWABLE_TABLET | Freq: Every day | ORAL | Status: DC
  Administered 2022-05-06 – 2022-05-08 (×3): 81 mg via ORAL
  Filled 2022-05-06 (×3): qty 1

## 2022-05-06 MED ORDER — SIMVASTATIN 20 MG PO TABS
40.0000 mg | ORAL_TABLET | Freq: Every day | ORAL | Status: DC
  Administered 2022-05-06 – 2022-05-08 (×3): 40 mg via ORAL
  Filled 2022-05-06 (×3): qty 2

## 2022-05-06 MED ORDER — CLOPIDOGREL BISULFATE 75 MG PO TABS
75.0000 mg | ORAL_TABLET | Freq: Every day | ORAL | Status: DC
  Administered 2022-05-06 – 2022-05-08 (×3): 75 mg via ORAL
  Filled 2022-05-06 (×3): qty 1

## 2022-05-06 NOTE — Progress Notes (Signed)
Patient remains tachycardic and hypertensive after home dose of labetalol restarted. MD notified. States to give evening dose of labetalol now. Evening dose of labetalol given.

## 2022-05-06 NOTE — Progress Notes (Addendum)
STROKE TEAM PROGRESS NOTE   INTERVAL HISTORY Her daughter is at the bedside.  Patient had confusion in her SNF and on exam, people noticed left sided facial droop. Patient has a history of falls with weakness in her arms and legs.  At baseline she is wheelchair-bound and unable to walk.  She also has mild baseline dementia LDL cholesterol 104 mg percent.  Hemoglobin A1c is 5.3. MRI  small focus of acute infarct in the right external capsule  Vitals:   05/05/22 2258 05/06/22 0113 05/06/22 0403 05/06/22 0704  BP:  (!) 145/76 (!) 171/68 (!) 192/82  Pulse: 90 73 71 72  Resp: '18 20 19 20  '$ Temp: 99 F (37.2 C) 98.2 F (36.8 C) 97.8 F (36.6 C) 99.1 F (37.3 C)  TempSrc: Oral Axillary Axillary Oral  SpO2: 97% 97% 96% 97%   CBC:  Recent Labs  Lab 05/05/22 0742 05/06/22 0228  WBC 3.0* 4.5  NEUTROABS 1.8  --   HGB 10.7* 11.1*  HCT 33.7* 32.5*  MCV 89.9 84.6  PLT 218 096   Basic Metabolic Panel:  Recent Labs  Lab 05/05/22 0742  NA 138  K 4.0  CL 102  CO2 26  GLUCOSE 86  BUN 17  CREATININE 0.66  CALCIUM 9.8   Lipid Panel:  Recent Labs  Lab 05/05/22 2124  CHOL 202*  TRIG 22  HDL 94  CHOLHDL 2.1  VLDL 4  LDLCALC 104*   HgbA1c:  Recent Labs  Lab 05/05/22 2124  HGBA1C 5.3   Urine Drug Screen: No results for input(s): "LABOPIA", "COCAINSCRNUR", "LABBENZ", "AMPHETMU", "THCU", "LABBARB" in the last 168 hours.  Alcohol Level No results for input(s): "ETH" in the last 168 hours.  IMAGING past 24 hours ECHOCARDIOGRAM COMPLETE  Result Date: 05/06/2022    ECHOCARDIOGRAM REPORT   Patient Name:   Brenda Alexander Date of Exam: 05/06/2022 Medical Rec #:  [id number]   Height:       62.0 in Accession #:    [id number]  Weight:       150.4 lb Date of Birth:  08-26-1932   BSA:          1.694 m Patient Age:    88 years    BP:           145/76 mmHg Patient Gender: F           HR:           83 bpm. Exam Location:  Inpatient Procedure: 2D Echo, Color Doppler and Cardiac Doppler  Indications:    Stroke  History:        Patient has prior history of Echocardiogram examinations, most                 recent 10/28/2021. CAD, Signs/Symptoms:Dyspnea; Risk                 Factors:Diabetes, Dyslipidemia and Hypertension.  Sonographer:    Memory Argue Referring Phys: [id number] RONDELL A SMITH IMPRESSIONS  1. Left ventricular ejection fraction, by estimation, is 65 to 70%. The left ventricle has normal function. The left ventricle has no regional wall motion abnormalities. There is severe asymmetric left ventricular hypertrophy of the basal-septal segment  (basal septal hypertrophy). Left ventricular diastolic parameters are indeterminate.  2. Right ventricular systolic function is normal. The right ventricular size is normal. There is severely elevated pulmonary artery systolic pressure.  3. Left atrial size was severely dilated.  4. The mitral valve is degenerative. No evidence of  mitral valve regurgitation. Mild mitral stenosis. The mean mitral valve gradient is 8.0 mmHg with average heart rate of 110 bpm. Severe mitral annular calcification.  5. The aortic valve is calcified. Aortic valve regurgitation is mild. Aortic valve sclerosis/calcification is present, without any evidence of aortic stenosis. Normal stroke volume index and DVI.  6. The inferior vena cava is dilated in size with >50% respiratory variability, suggesting right atrial pressure of 8 mmHg. Comparison(s): Significant increase in mitral valve gradients at higher heart rates. FINDINGS  Left Ventricle: Left ventricular ejection fraction, by estimation, is 65 to 70%. The left ventricle has normal function. The left ventricle has no regional wall motion abnormalities. The left ventricular internal cavity size was small. There is severe asymmetric left ventricular hypertrophy of the basal-septal segment. Left ventricular diastolic parameters are indeterminate. Right Ventricle: The right ventricular size is normal. No increase in right  ventricular wall thickness. Right ventricular systolic function is normal. There is severely elevated pulmonary artery systolic pressure. The tricuspid regurgitant velocity is 3.62 m/s, and with an assumed right atrial pressure of 8 mmHg, the estimated right ventricular systolic pressure is 96.2 mmHg. Left Atrium: Left atrial size was severely dilated. Right Atrium: Right atrial size was normal in size. Pericardium: There is no evidence of pericardial effusion. Mitral Valve: MVA 3.65 cm2 by 2D planimetry; Mitral Valve Area (MVA) = 1.74 cm2 anterior mitral valve prolapse. The mitral valve is degenerative in appearance. Severe mitral annular calcification. No evidence of mitral valve regurgitation. Mild mitral valve stenosis. MV peak gradient, 11.0 mmHg. The mean mitral valve gradient is 8.0 mmHg with average heart rate of 110 bpm. Tricuspid Valve: The tricuspid valve is normal in structure. Tricuspid valve regurgitation is mild . No evidence of tricuspid stenosis. Aortic Valve: The aortic valve is calcified. Aortic valve regurgitation is mild. Aortic regurgitation PHT measures 412 msec. Aortic valve sclerosis/calcification is present, without any evidence of aortic stenosis. Aortic valve mean gradient measures 10.0 mmHg. Aortic valve peak gradient measures 18.5 mmHg. Aortic valve area, by VTI measures 1.95 cm. Pulmonic Valve: The pulmonic valve was normal in structure. Pulmonic valve regurgitation is not visualized. No evidence of pulmonic stenosis. Aorta: The aortic root and ascending aorta are structurally normal, with no evidence of dilitation. Venous: The inferior vena cava is dilated in size with greater than 50% respiratory variability, suggesting right atrial pressure of 8 mmHg. IAS/Shunts: The interatrial septum is aneurysmal. No atrial level shunt detected by color flow Doppler.  LEFT VENTRICLE PLAX 2D LVIDd:         3.70 cm LVIDs:         2.40 cm LV PW:         1.30 cm LV IVS:        1.20 cm LVOT diam:      2.20 cm LV SV:         79 LV SV Index:   47 LVOT Area:     3.80 cm  RIGHT VENTRICLE TAPSE (M-mode): 2.0 cm LEFT ATRIUM             Index        RIGHT ATRIUM           Index LA diam:        4.30 cm 2.54 cm/m   RA Area:     13.40 cm LA Vol (A2C):   88.8 ml 52.43 ml/m  RA Volume:   26.90 ml  15.88 ml/m LA Vol (A4C):   95.8 ml 56.57  ml/m LA Biplane Vol: 92.4 ml 54.56 ml/m  AORTIC VALVE AV Area (Vmax):    2.02 cm AV Area (Vmean):   1.97 cm AV Area (VTI):     1.95 cm AV Vmax:           215.00 cm/s AV Vmean:          147.000 cm/s AV VTI:            0.406 m AV Peak Grad:      18.5 mmHg AV Mean Grad:      10.0 mmHg LVOT Vmax:         114.00 cm/s LVOT Vmean:        76.300 cm/s LVOT VTI:          0.208 m LVOT/AV VTI ratio: 0.51 AI PHT:            412 msec  AORTA Ao Root diam: 3.50 cm Ao Asc diam:  3.50 cm MITRAL VALVE              TRICUSPID VALVE MV Area VTI:  1.83 cm    TR Peak grad:   52.4 mmHg MV Peak grad: 11.0 mmHg   TR Vmax:        362.00 cm/s MV Mean grad: 8.0 mmHg MV Vmax:      1.66 m/s    SHUNTS MV Vmean:     127.0 cm/s  Systemic VTI:  0.21 m                           Systemic Diam: 2.20 cm Rudean Haskell MD Electronically signed by Rudean Haskell MD Signature Date/Time: 05/06/2022/9:56:36 AM    Final    CT ANGIO HEAD NECK W WO CM  Result Date: 05/05/2022 CLINICAL DATA:  Stroke/TIA EXAM: CT ANGIOGRAPHY HEAD AND NECK TECHNIQUE: Multidetector CT imaging of the head and neck was performed using the standard protocol during bolus administration of intravenous contrast. Multiplanar CT image reconstructions and MIPs were obtained to evaluate the vascular anatomy. Carotid stenosis measurements (when applicable) are obtained utilizing NASCET criteria, using the distal internal carotid diameter as the denominator. RADIATION DOSE REDUCTION: This exam was performed according to the departmental dose-optimization program which includes automated exposure control, adjustment of the mA and/or kV according  to patient size and/or use of iterative reconstruction technique. CONTRAST:  12m OMNIPAQUE IOHEXOL 350 MG/ML SOLN COMPARISON:  None Available. FINDINGS: CTA NECK FINDINGS SKELETON: There is no bony spinal canal stenosis. No lytic or blastic lesion. OTHER NECK: Normal pharynx, larynx and major salivary glands. No cervical lymphadenopathy. Unremarkable thyroid gland. UPPER CHEST: No pneumothorax or pleural effusion. No nodules or masses. AORTIC ARCH: There is calcific atherosclerosis of the aortic arch. There is no aneurysm, dissection or hemodynamically significant stenosis of the visualized portion of the aorta. Conventional 3 vessel aortic branching pattern. The visualized proximal subclavian arteries are widely patent. RIGHT CAROTID SYSTEM: Normal without aneurysm, dissection or stenosis. LEFT CAROTID SYSTEM: No dissection, occlusion or aneurysm. Mild atherosclerotic calcification at the carotid bifurcation without hemodynamically significant stenosis. VERTEBRAL ARTERIES: Left dominant configuration. Both origins are clearly patent. There is no dissection, occlusion or flow-limiting stenosis to the skull base (V1-V3 segments). CTA HEAD FINDINGS POSTERIOR CIRCULATION: --Vertebral arteries: Normal V4 segments. --Inferior cerebellar arteries: Normal. --Basilar artery: Normal. --Superior cerebellar arteries: Normal. --Posterior cerebral arteries (PCA): Normal. ANTERIOR CIRCULATION: --Intracranial internal carotid arteries: Atherosclerotic calcification of the internal carotid arteries at the skull base without hemodynamically significant  stenosis. --Anterior cerebral arteries (ACA): Normal. Both A1 segments are present. Patent anterior communicating artery (a-comm). --Middle cerebral arteries (MCA): Normal. VENOUS SINUSES: As permitted by contrast timing, patent. ANATOMIC VARIANTS: None Review of the MIP images confirms the above findings. IMPRESSION: 1. No emergent large vessel occlusion or hemodynamically  significant stenosis of the head or neck. 2. Mild left carotid bifurcation atherosclerosis without hemodynamically significant stenosis. 3.  Aortic atherosclerosis (ICD10-I70.0). Electronically Signed   By: Ulyses Jarred M.D.   On: 05/05/2022 19:58   MR BRAIN WO CONTRAST  Result Date: 05/05/2022 CLINICAL DATA:  Left facial droop EXAM: MRI HEAD WITHOUT CONTRAST TECHNIQUE: Multiplanar, multiecho pulse sequences of the brain and surrounding structures were obtained without intravenous contrast. COMPARISON:  Same day CT brain FINDINGS: Brain: Smallacute infarct in the right external capsule (series 3, image 20). There is a focus of punctate microhemorrhage adjacent to this. With an associated small volume blood products. Small nodular focus along the posterior margin of the left ventricular atrium (series 6, image 16/series 3, image 30) is nonspecific and may represent a site ovary matter heterotopia. No hydrocpehalus. No extra axial fluid collection.Sequela of severe chronic microvascular ischemic change. Generalized volume loss. Partially empty sella Vascular: Normal flow voids. Skull and upper cervical spine: Normal marrow signal.There is likely at least moderate stenosis at the craniocervical junction secondary to a degenerative panus. Sinuses/Orbits: Bilateral lens replacement. Mucosal thickening right maxillary sinus. Other: None IMPRESSION: 1. Small focus of acute infarct in the right external capsule. There is a focus of microhemorrhage adjacent to this area of infarct, but not definitively associated with the infarct. 2. Small nodular focus along the posterior margin of the left ventricular atrium is nonspecific and may represent a site of gray matter heterotopia; recommend comparison with prior imaging. Electronically Signed   By: Marin Roberts M.D.   On: 05/05/2022 13:28   DG Chest Portable 1 View  Result Date: 05/05/2022 CLINICAL DATA:  Hypertension. EXAM: PORTABLE CHEST 1 VIEW COMPARISON:  Chest  x-ray 02/01/2022. FINDINGS: Cardiomediastinal silhouette is mildly enlarged, probably similar to prior when accounting for differences in technique. No consolidation. No visible pleural effusions or pneumothorax. No acute osseous abnormality. IMPRESSION: No evidence of acute cardiopulmonary disease. Electronically Signed   By: Margaretha Sheffield M.D.   On: 05/05/2022 12:15    PHYSICAL EXAM General: Pleasant, well-appearing in bed. No acute distress.  . Afebrile. Head is nontraumatic. Neck is supple without bruit.    Cardiac exam no murmur or gallop. Lungs are clear to auscultation. Distal pulses are well felt.  . Afebrile. Head is nontraumatic. Neck is supple without bruit.    Cardiac exam no murmur or gallop. Lungs are clear to auscultation. Distal pulses are well felt. Neurological Exam :  Awake alert oriented to time and place.  Diminished attention, registration and recall.  No aphasia apraxia or dysarthria.  Follows commands well. Motor system 4/5 strength in RUE, LUE. 3/5 strength RLE, and LLE which appears chronic as per daughter.. Tremor present when doing finger to nose. Able to follow commands but delayed.   Decreased sensation in the LLE. No focal deficit. No dysarthria. Difficulty naming animals. Psych: Normal mood and affect   ASSESSMENT/PLAN Brenda Alexander is a 88 y.o. female with medical history significant of history of hypertension, hyperlipidemia, CAD, non-insulin-dependent diabetes, dementia, and bedbound who presented for complaints of left-sided facial droop.   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  CTA head & neck no LVO or stenosis of the head and neck MRI  small focus of acute infarct in the right external capsule 2D Echo EF 65-70%, left atrium severe dilation LDL 104 HgbA1c 5.3 VTE prophylaxis - lovenox    Diet   Diet Heart Room service appropriate? Yes with Assist; Fluid consistency: Thin    No antithrombotic prior to admission, now on aspirin 81 mg daily and clopidogrel 75 mg daily. Take this combination for three weeks and then aspirin '81mg'$  alone afterwards.  Therapy recommendations:  pending Disposition:  SNF  Hypertension Home meds:  clonidine 0.2 mg twice daily, isosorbide mononitrate 20 mg daily, labetalol 50 mg twice daily, and spironolactone 0.'125mg'$  Unstable Permissive hypertension (OK if < 220/120) but gradually normalize in 5-7 days Long-term BP goal normotensive  Hyperlipidemia Home meds:  zocor '20mg'$ , will increase dose to 40 mg daily LDL 104, goal < 70 MRI  small focus of acute infarct in the right external capsule Continue statin at discharge  Other Stroke Risk Factors Advanced Age >/= 24  Family hx stroke (father) Coronary artery disease  Other Active Problems Dementia with behavioral disturbance At baseline patient is usually alert and oriented to person and is able to recognize family members like her daughters at bedside. -Delirium precautions -Bed alarm on -Ativan IV prn   Pancytopenia Chronic.  WBC 3 and hemoglobin 10.7 which appears similar to prior. -Continue to monitor   Possible urinary tract infection Urinalysis noted trace leukocytes with rare bacteria, and 11-20 WBCs.  Patient had been given 3 g of fosfomycin in ED. -Urine culture pending  Hospital day # 0  Stormy Fabian, MD PGY-1 Psychiatry  STROKE MD NOTE :  I have personally obtained history,examined this patient, reviewed notes, independently viewed imaging studies, participated in medical decision making and plan of care.ROS completed by me personally and pertinent positives fully documented  I have made any additions or clarifications directly to the above note. Agree with note above.  Patient presented with facial droop secondary to small right internal capsule infarct from small vessel disease.  Recommend aspirin and Plavix for 3 weeks followed by aspirin alone and aggressive  risk factor modification.  Increase dose of Zocor to 40 mg daily.  Physical Occupational Therapy consults.  Check urine culture for UTI.  Long discussion with patient and daughter at the bedside and answered questions.  Discussed with Dr.kshitz.  Greater than 50% time during this 50-minute visit was spent in counseling and coordination of care about her lacunar stroke and discussion about stroke evaluation, prevention and treatment and answering questions.  Stroke team will sign off.  Kindly call for questions  Antony Contras, MD Medical Director [place] Pager: [phone] 05/06/2022 1:42 PM   To contact Stroke Continuity provider, please refer to [email or web address] After hours, contact General Neurology

## 2022-05-06 NOTE — NC FL2 (Signed)
[place] LEVEL OF CARE SCREENING TOOL     IDENTIFICATION  Patient Name: Brenda Alexander Birthdate: 10/30/1932 Sex: female Admission Date (Current Location): 05/05/2022  [facility] and [place] Number:  Herbalist and Address:  [facility], [place] [address], [place], [place] [address]      Provider Number: [id number]  Attending Physician Name and Address:  Aline August, MD  Relative Name and Phone Number:       Current Level of Care: Hospital Recommended Level of Care: [place] Prior Approval Number:    Date Approved/Denied:   PASRR Number:    Discharge Plan: SNF    Current Diagnoses: Patient Active Problem List   Diagnosis Date Noted   CVA (cerebral vascular accident) ([place]) 05/05/2022   Hypertensive urgency 05/05/2022   Dementia with behavioral disturbance ([place]) 05/05/2022   UTI (urinary tract infection) 05/05/2022   Left lower quadrant abdominal pain 12/04/2021   Altered level of consciousness 11/18/2021   History of abnormal weight loss 11/07/2021   Prolonged Q-T interval on ECG 11/07/2021   Incarcerated left inguinal hernia 10/31/2021   Near syncope 10/27/2021   HLD (hyperlipidemia) 10/27/2021   Rhabdomyolysis 10/27/2021   AKI (acute kidney injury) ([place]) 08/07/2020   Chronic heart failure with preserved ejection fraction ([place]) 10/27/2018   Multiple falls 10/27/2018   SBO (small bowel obstruction) ([place]) 07/22/2018   Leukocytosis 07/22/2018   Hypokalemia 07/22/2018   Type 2 diabetes mellitus ([place]) 07/22/2018   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   Debility 07/22/2018   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   PMB (postmenopausal bleeding) 04/09/2018   Bladder mass    Pancytopenia ([place]) 01/11/2018   Essential hypertension 01/11/2018   Coronary artery disease 01/11/2018    Orientation RESPIRATION BLADDER Height & Weight     Self  Normal Incontinent Weight:   Height:     BEHAVIORAL  SYMPTOMS/MOOD NEUROLOGICAL BOWEL NUTRITION STATUS      Incontinent Diet (heart healthy)  AMBULATORY STATUS COMMUNICATION OF NEEDS Skin   Extensive Assist Verbally Normal                       Personal Care Assistance Level of Assistance  Bathing, Feeding, Dressing Bathing Assistance: Maximum assistance Feeding assistance: Maximum assistance Dressing Assistance: Maximum assistance     Functional Limitations Info             SPECIAL CARE FACTORS FREQUENCY                       Contractures Contractures Info: Not present    Additional Factors Info  Code Status, Allergies Code Status Info: DNR Allergies Info: Other, Penicillins           Current Medications (05/06/2022):  This is the current hospital active medication list Current Facility-Administered Medications  Medication Dose Route Frequency Provider Last Rate Last Admin   acetaminophen (TYLENOL) tablet 650 mg  650 mg Oral Q4H PRN Fuller Plan A, MD   650 mg at 05/05/22 2126   Or   acetaminophen (TYLENOL) 160 MG/5ML solution 650 mg  650 mg Per Tube Q4H PRN Fuller Plan A, MD       Or   acetaminophen (TYLENOL) suppository 650 mg  650 mg Rectal Q4H PRN Fuller Plan A, MD       aspirin chewable tablet 81 mg  81 mg Oral Daily Stormy Fabian, MD       busPIRone (BUSPAR) tablet  10 mg  10 mg Oral TID Fuller Plan A, MD   10 mg at 05/06/22 0950   clopidogrel (PLAVIX) tablet 75 mg  75 mg Oral Daily Stormy Fabian, MD       enoxaparin (LOVENOX) injection 40 mg  40 mg Subcutaneous Q24H Smith, Rondell A, MD   40 mg at 05/05/22 1811   hydrALAZINE (APRESOLINE) injection 10 mg  10 mg Intravenous Q4H PRN Fuller Plan A, MD   10 mg at 05/06/22 9794   labetalol (NORMODYNE) tablet 50 mg  50 mg Oral BID Aline August, MD   50 mg at 05/06/22 1127   LORazepam (ATIVAN) injection 0.5 mg  0.5 mg Intravenous Q6H PRN Fuller Plan A, MD       melatonin tablet 3 mg  3 mg Oral QHS PRN Tamala Julian, Rondell A, MD        pantoprazole (PROTONIX) EC tablet 40 mg  40 mg Oral Daily Smith, Rondell A, MD   40 mg at 05/06/22 0950   senna-docusate (Senokot-S) tablet 1 tablet  1 tablet Oral QHS PRN Fuller Plan A, MD       simvastatin (ZOCOR) tablet 40 mg  40 mg Oral QHS Stormy Fabian, MD         Discharge Medications: Please see discharge summary for a list of discharge medications.  Relevant Imaging Results:  Relevant Lab Results:   Additional Information SS#: [id number]  Geralynn Ochs, LCSW

## 2022-05-06 NOTE — Care Management Obs Status (Signed)
[place] NOTIFICATION   Patient Details  Name: Brenda Alexander MRN: [id number] Date of Birth: Jan 27, 1933   Medicare Observation Status Notification Given:  Yes    Geralynn Ochs, LCSW 05/06/2022, 2:02 PM

## 2022-05-06 NOTE — Progress Notes (Signed)
Per ED report from SNF and daughter's report pt is bedbound. When researched pt is actually wheelchair bound r/t BLE extreme weakness.

## 2022-05-06 NOTE — TOC Initial Note (Signed)
Transition of Care [facility]) - Initial/Assessment Note    Patient Details  Name: Brenda Alexander MRN: [id number] Date of Birth: 1933-06-27  Transition of Care [facility]) CM/SW Contact:    Geralynn Ochs, LCSW Phone Number: 05/06/2022, 2:00 PM  Clinical Narrative:     CSW met with patient and daughter at bedside to discuss return to SNF. Patient is LTC at [facility] and they can accept back when stable. Family has concerns about the wheelchair that patient uses at SNF, asking if she can get one of her own that they can take to SNF for her to use. CSW sent wheelchair order to [employer], waiting insurance approval. CSW to follow.              Expected Discharge Plan: [place] Barriers to Discharge: Continued Medical Work up   Patient Goals and CMS Choice Patient states their goals for this hospitalization and ongoing recovery are:: get [facility]gov Compare Post Acute Care list provided to:: Patient Choice offered to / list presented to : Patient, Adult Children  Expected Discharge Plan and Services Expected Discharge Plan: [place] Acute Care Choice: [place] Living arrangements for the past 2 months: [place]                                      Prior Living Arrangements/Services Living arrangements for the past 2 months: [place] Lives with:: Facility Resident Patient language and need for interpreter reviewed:: No Do you feel safe going back to the place where you live?: Yes      Need for Family Participation in Patient Care: Yes (Comment) Care giver support system in place?: Yes (comment)   Criminal Activity/Legal Involvement Pertinent to Current Situation/Hospitalization: No - Comment as needed  Activities of Daily Living Home Assistive Devices/Equipment:  (from a SNF) ADL Screening (condition at time of admission) Patient's cognitive ability adequate to safely  complete daily activities?: Yes Is the patient deaf or have difficulty hearing?: No Does the patient have difficulty seeing, even when wearing glasses/contacts?: No Does the patient have difficulty concentrating, remembering, or making decisions?: Yes Patient able to express need for assistance with ADLs?: Yes Does the patient have difficulty dressing or bathing?: Yes Independently performs ADLs?: No Communication: Needs assistance Is this a change from baseline?: Pre-admission baseline Dressing (OT): Dependent Is this a change from baseline?: Pre-admission baseline Grooming: Dependent Is this a change from baseline?: Pre-admission baseline Feeding: Needs assistance Is this a change from baseline?: Pre-admission baseline Bathing: Dependent Is this a change from baseline?: Pre-admission baseline Toileting: Dependent Is this a change from baseline?: Pre-admission baseline In/Out Bed: Dependent (daughters state pt is bed bound at SNF) Is this a change from baseline?: Pre-admission baseline Walks in Home: Dependent (daughters state pt is bedbound at SNF) Is this a change from baseline?: Pre-admission baseline Does the patient have difficulty walking or climbing stairs?: Yes (daughters state pt is bedbound at SNF) Weakness of Legs: Both Weakness of Arms/Hands: Both (Bilat hand tremors worse lately)  Permission Sought/Granted Permission sought to share information with : Facility Sport and exercise psychologist, Family Supports Permission granted to share information with : Yes, Verbal Permission Granted  Share Information with NAME: Nanette, Dester  Permission granted to share info w AGENCY: SNF  Permission granted to share info w Relationship: Daughters     Emotional Assessment  Appearance:: Appears stated age Attitude/Demeanor/Rapport: Unable to Assess Affect (typically observed): Unable to Assess Orientation: : Oriented to Self Alcohol / Substance Use: Not Applicable Psych Involvement:  No (comment)  Admission diagnosis:  Facial droop [R29.810] CVA (cerebral vascular accident) ([place]) [I63.9] Urinary tract infection without hematuria, site unspecified [N39.0] Cerebrovascular accident (CVA), unspecified mechanism ([place]) [I63.9] Hypertension, unspecified type [I10] Patient Active Problem List   Diagnosis Date Noted   CVA (cerebral vascular accident) ([place]) 05/05/2022   Hypertensive urgency 05/05/2022   Dementia with behavioral disturbance ([place]) 05/05/2022   UTI (urinary tract infection) 05/05/2022   Left lower quadrant abdominal pain 12/04/2021   Altered level of consciousness 11/18/2021   History of abnormal weight loss 11/07/2021   Prolonged Q-T interval on ECG 11/07/2021   Incarcerated left inguinal hernia 10/31/2021   Near syncope 10/27/2021   HLD (hyperlipidemia) 10/27/2021   Rhabdomyolysis 10/27/2021   AKI (acute kidney injury) ([place]) 08/07/2020   Chronic heart failure with preserved ejection fraction ([place]) 10/27/2018   Multiple falls 10/27/2018   SBO (small bowel obstruction) ([place]) 07/22/2018   Leukocytosis 07/22/2018   Hypokalemia 07/22/2018   Type 2 diabetes mellitus ([place]) 07/22/2018   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

## 2022-05-06 NOTE — Progress Notes (Signed)
OT Cancellation Note  Patient Details Name: Estefanie Cornforth MRN: [id number] DOB: [birth year]   Cancelled Treatment:    Reason Eval/Treat Not Completed: Medical issues which prohibited therapy- elevated HR and BP. Will follow and see as able.   Jolaine Artist, OT Acute Rehabilitation Services Office [phone]   Delight Stare 05/06/2022, 11:31 AM

## 2022-05-06 NOTE — Progress Notes (Signed)
Patient suffers from stroke which impairs their ability to perform daily activities like bathing, dressing, and toileting in the home.  A walker will not resolve  issue with performing activities of daily living. A wheelchair will allow patient to safely perform daily activities. Patient is not able to propel themselves in the home using a standard weight wheelchair due to general weakness. Patient can self propel in the lightweight wheelchair. Length of need Lifetime.  Accessories: elevating leg rests (ELRs), wheel locks, extensions and anti-tippers.

## 2022-05-06 NOTE — Progress Notes (Signed)
SLP Cancellation Note  Patient Details Name: [name] MRN: [id number] DOB: [birth year]   Cancelled treatment:       Reason Eval/Treat Not Completed: SLP screened, no needs identified, will sign off. Discussed with family. Patient at baseline from a cognitive-linguistic standpoint. Family reports a general decline over time related to dementia.   Gabriel Rainwater MA, CCC-SLP    [place] Kreitzer [place] 05/06/2022, 8:56 AM

## 2022-05-06 NOTE — Progress Notes (Signed)
PROGRESS NOTE    Brenda Alexander  VQM:[id number] DOB: Jan 09, 1933 DOA: 05/05/2022 PCP: Brenda Mile, NP   Brief Narrative:  88 y.o. female with medical history significant of history of hypertension, hyperlipidemia, CAD, non-insulin-dependent diabetes, dementia, mostly wheelchair-bound presented with left-sided facial droop.  On presentation, CT of the head was negative for acute abnormality.  MRI of brain showed small focus of acute infarction in the right external capsule with microhemorrhage adjacent to the area of infarct.  UA was concerning for UTI and she was given oral fosfomycin.  Neurology was consulted.  Assessment & Plan:   Acute CVA -Presented with left-sided facial droop which improved by the time she presented to the ED.   -CT of the head was negative for acute abnormality.  MRI of brain showed small focus of acute infarction in the right external capsule with microhemorrhage adjacent to the area of infarct. -Neurology following: Currently on aspirin and statin -PT/OT/SLP evaluation -CTA of head and neck was negative for significant occlusion/stenosis -LDL 104; hemoglobin A1c 5.3.  Follow 2D echo  Dementia with behavioral disturbance Physical deconditioning -Mostly wheelchair-bound recently.  At baseline, usually alert and oriented to person and able to recognize family -Fall/delirium precautions. -PT eval  Hypertensive urgency -Intermittently on the higher side.  Allowing for permissive hypertension. -Resume labetalol because of patient showing signs of tachycardia -Chronic, Imdur, spironolactone on hold for now.  -[employer] Continue statin  Anemia of chronic disease From chronic illnesses.  Hemoglobin stable  Leukopenia -Sepsis resolved  Possible urinary tract infection: Present on admission -Received 3 g fosfomycin in the ED.   DVT prophylaxis: Lovenox Code Status: DNR Family Communication: Daughter-in-law at bedside Disposition Plan: Status is: Observation The  patient will require care spanning > 2 midnights and should be moved to inpatient because: Of severity of illness.  PT eval pending.    Consultants: Neurology  Procedures: None  Antimicrobials: Fosfomycin 1 dose in the ED   Subjective: Patient seen and examined at bedside.  Awake, poor historian, slightly confused.  Daughter-in-law at bedside.  No fever, vomiting, seizures reported.  Objective: Vitals:   05/06/22 0113 05/06/22 0403 05/06/22 0704 05/06/22 1017  BP: (!) 145/76 (!) 171/68 (!) 192/82 (!) 186/95  Pulse: 73 71 72   Resp: '20 19 20   '$ Temp: 98.2 F (36.8 C) 97.8 F (36.6 C) 99.1 F (37.3 C)   TempSrc: Axillary Axillary Oral   SpO2: 97% 96% 97% 97%    Intake/Output Summary (Last 24 hours) at 05/06/2022 1147 Last data filed at 05/06/2022 0911 Gross per 24 hour  Intake 960 ml  Output 2050 ml  Net -1090 ml   There were no vitals filed for this visit.  Examination:  General exam: Appears calm and comfortable.  Looks chronically ill and deconditioned.  Currently on room air. Respiratory system: Bilateral decreased breath sounds at bases with scattered crackles Cardiovascular system: S1 & S2 heard, Rate controlled Gastrointestinal system: Abdomen is nondistended, soft and nontender. Normal bowel sounds heard. Extremities: No cyanosis, clubbing; trace lower extremity edema Central nervous system: Awake, slow to respond, poor historian, slightly confused.  Right upper extremity tremor present. Moving extremities Skin: No rashes, lesions or ulcers Psychiatry: flat affect.  No signs of agitation.   Data Reviewed: I have personally reviewed following labs and imaging studies  CBC: Recent Labs  Lab 05/05/22 0742 05/06/22 0228  WBC 3.0* 4.5  NEUTROABS 1.8  --   HGB 10.7* 11.1*  HCT 33.7* 32.5*  MCV 89.9 84.6  PLT  218 017   Basic Metabolic Panel: Recent Labs  Lab 05/05/22 0742  NA 138  K 4.0  CL 102  CO2 26  GLUCOSE 86  BUN 17  CREATININE 0.66   CALCIUM 9.8   GFR: CrCl cannot be calculated (Unknown ideal weight.). Liver Function Tests: Recent Labs  Lab 05/05/22 0742  AST 19  ALT 12  ALKPHOS 87  BILITOT 0.5  PROT 6.5  ALBUMIN 3.6   No results for input(s): "LIPASE", "AMYLASE" in the last 168 hours. No results for input(s): "AMMONIA" in the last 168 hours. Coagulation Profile: No results for input(s): "INR", "PROTIME" in the last 168 hours. Cardiac Enzymes: No results for input(s): "CKTOTAL", "CKMB", "CKMBINDEX", "TROPONINI" in the last 168 hours. BNP (last 3 results) No results for input(s): "PROBNP" in the last 8760 hours. HbA1C: Recent Labs    05/05/22 2124  HGBA1C 5.3   CBG: No results for input(s): "GLUCAP" in the last 168 hours. Lipid Profile: Recent Labs    05/05/22 2124  CHOL 202*  HDL 94  LDLCALC 104*  TRIG 22  CHOLHDL 2.1   Thyroid Function Tests: No results for input(s): "TSH", "T4TOTAL", "FREET4", "T3FREE", "THYROIDAB" in the last 72 hours. Anemia Panel: No results for input(s): "VITAMINB12", "FOLATE", "FERRITIN", "TIBC", "IRON", "RETICCTPCT" in the last 72 hours. Sepsis Labs: No results for input(s): "PROCALCITON", "LATICACIDVEN" in the last 168 hours.  No results found for this or any previous visit (from the past 240 hour(s)).       Radiology Studies: ECHOCARDIOGRAM COMPLETE  Result Date: 05/06/2022    ECHOCARDIOGRAM REPORT   Patient Name:   Brenda Alexander Date of Exam: 05/06/2022 Medical Rec #:  [id number]   Height:       62.0 in Accession #:    [id number]  Weight:       150.4 lb Date of Birth:  04-24-1933   BSA:          1.694 m Patient Age:    75 years    BP:           145/76 mmHg Patient Gender: F           HR:           83 bpm. Exam Location:  Inpatient Procedure: 2D Echo, Color Doppler and Cardiac Doppler Indications:    Stroke  History:        Patient has prior history of Echocardiogram examinations, most                 recent 10/28/2021. CAD, Signs/Symptoms:Dyspnea; Risk                  Factors:Diabetes, Dyslipidemia and Hypertension.  Sonographer:    Memory Argue Referring Phys: [id number] Brenda Alexander IMPRESSIONS  1. Left ventricular ejection fraction, by estimation, is 65 to 70%. The left ventricle has normal function. The left ventricle has no regional wall motion abnormalities. There is severe asymmetric left ventricular hypertrophy of the basal-septal segment  (basal septal hypertrophy). Left ventricular diastolic parameters are indeterminate.  2. Right ventricular systolic function is normal. The right ventricular size is normal. There is severely elevated pulmonary artery systolic pressure.  3. Left atrial size was severely dilated.  4. The mitral valve is degenerative. No evidence of mitral valve regurgitation. Mild mitral stenosis. The mean mitral valve gradient is 8.0 mmHg with average heart rate of 110 bpm. Severe mitral annular calcification.  5. The aortic valve is calcified. Aortic valve regurgitation is mild. Aortic valve  sclerosis/calcification is present, without any evidence of aortic stenosis. Normal stroke volume index and DVI.  6. The inferior vena cava is dilated in size with >50% respiratory variability, suggesting right atrial pressure of 8 mmHg. Comparison(s): Significant increase in mitral valve gradients at higher heart rates. FINDINGS  Left Ventricle: Left ventricular ejection fraction, by estimation, is 65 to 70%. The left ventricle has normal function. The left ventricle has no regional wall motion abnormalities. The left ventricular internal cavity size was small. There is severe asymmetric left ventricular hypertrophy of the basal-septal segment. Left ventricular diastolic parameters are indeterminate. Right Ventricle: The right ventricular size is normal. No increase in right ventricular wall thickness. Right ventricular systolic function is normal. There is severely elevated pulmonary artery systolic pressure. The tricuspid regurgitant velocity is 3.62 m/s,  and with an assumed right atrial pressure of 8 mmHg, the estimated right ventricular systolic pressure is 56.3 mmHg. Left Atrium: Left atrial size was severely dilated. Right Atrium: Right atrial size was normal in size. Pericardium: There is no evidence of pericardial effusion. Mitral Valve: MVA 3.65 cm2 by 2D planimetry; Mitral Valve Area (MVA) = 1.74 cm2 anterior mitral valve prolapse. The mitral valve is degenerative in appearance. Severe mitral annular calcification. No evidence of mitral valve regurgitation. Mild mitral valve stenosis. MV peak gradient, 11.0 mmHg. The mean mitral valve gradient is 8.0 mmHg with average heart rate of 110 bpm. Tricuspid Valve: The tricuspid valve is normal in structure. Tricuspid valve regurgitation is mild . No evidence of tricuspid stenosis. Aortic Valve: The aortic valve is calcified. Aortic valve regurgitation is mild. Aortic regurgitation PHT measures 412 msec. Aortic valve sclerosis/calcification is present, without any evidence of aortic stenosis. Aortic valve mean gradient measures 10.0 mmHg. Aortic valve peak gradient measures 18.5 mmHg. Aortic valve area, by VTI measures 1.95 cm. Pulmonic Valve: The pulmonic valve was normal in structure. Pulmonic valve regurgitation is not visualized. No evidence of pulmonic stenosis. Aorta: The aortic root and ascending aorta are structurally normal, with no evidence of dilitation. Venous: The inferior vena cava is dilated in size with greater than 50% respiratory variability, suggesting right atrial pressure of 8 mmHg. IAS/Shunts: The interatrial septum is aneurysmal. No atrial level shunt detected by color flow Doppler.  LEFT VENTRICLE PLAX 2D LVIDd:         3.70 cm LVIDs:         2.40 cm LV PW:         1.30 cm LV IVS:        1.20 cm LVOT diam:     2.20 cm LV SV:         79 LV SV Index:   47 LVOT Area:     3.80 cm  RIGHT VENTRICLE TAPSE (M-mode): 2.0 cm LEFT ATRIUM             Index        RIGHT ATRIUM           Index LA diam:         4.30 cm 2.54 cm/m   RA Area:     13.40 cm LA Vol (A2C):   88.8 ml 52.43 ml/m  RA Volume:   26.90 ml  15.88 ml/m LA Vol (A4C):   95.8 ml 56.57 ml/m LA Biplane Vol: 92.4 ml 54.56 ml/m  AORTIC VALVE AV Area (Vmax):    2.02 cm AV Area (Vmean):   1.97 cm AV Area (VTI):     1.95 cm AV Vmax:  215.00 cm/s AV Vmean:          147.000 cm/s AV VTI:            0.406 m AV Peak Grad:      18.5 mmHg AV Mean Grad:      10.0 mmHg LVOT Vmax:         114.00 cm/s LVOT Vmean:        76.300 cm/s LVOT VTI:          0.208 m LVOT/AV VTI ratio: 0.51 AI PHT:            412 msec  AORTA Ao Root diam: 3.50 cm Ao Asc diam:  3.50 cm MITRAL VALVE              TRICUSPID VALVE MV Area VTI:  1.83 cm    TR Peak grad:   52.4 mmHg MV Peak grad: 11.0 mmHg   TR Vmax:        362.00 cm/s MV Mean grad: 8.0 mmHg MV Vmax:      1.66 m/s    SHUNTS MV Vmean:     127.0 cm/s  Systemic VTI:  0.21 m                           Systemic Diam: 2.20 cm Rudean Haskell MD Electronically signed by Rudean Haskell MD Signature Date/Time: 05/06/2022/9:56:36 AM    Final    CT ANGIO HEAD NECK W WO CM  Result Date: 05/05/2022 CLINICAL DATA:  Stroke/TIA EXAM: CT ANGIOGRAPHY HEAD AND NECK TECHNIQUE: Multidetector CT imaging of the head and neck was performed using the standard protocol during bolus administration of intravenous contrast. Multiplanar CT image reconstructions and MIPs were obtained to evaluate the vascular anatomy. Carotid stenosis measurements (when applicable) are obtained utilizing NASCET criteria, using the distal internal carotid diameter as the denominator. RADIATION DOSE REDUCTION: This exam was performed according to the departmental dose-optimization program which includes automated exposure control, adjustment of the mA and/or kV according to patient size and/or use of iterative reconstruction technique. CONTRAST:  28m OMNIPAQUE IOHEXOL 350 MG/ML SOLN COMPARISON:  None Available. FINDINGS: CTA NECK FINDINGS SKELETON:  There is no bony spinal canal stenosis. No lytic or blastic lesion. OTHER NECK: Normal pharynx, larynx and major salivary glands. No cervical lymphadenopathy. Unremarkable thyroid gland. UPPER CHEST: No pneumothorax or pleural effusion. No nodules or masses. AORTIC ARCH: There is calcific atherosclerosis of the aortic arch. There is no aneurysm, dissection or hemodynamically significant stenosis of the visualized portion of the aorta. Conventional 3 vessel aortic branching pattern. The visualized proximal subclavian arteries are widely patent. RIGHT CAROTID SYSTEM: Normal without aneurysm, dissection or stenosis. LEFT CAROTID SYSTEM: No dissection, occlusion or aneurysm. Mild atherosclerotic calcification at the carotid bifurcation without hemodynamically significant stenosis. VERTEBRAL ARTERIES: Left dominant configuration. Both origins are clearly patent. There is no dissection, occlusion or flow-limiting stenosis to the skull base (V1-V3 segments). CTA HEAD FINDINGS POSTERIOR CIRCULATION: --Vertebral arteries: Normal V4 segments. --Inferior cerebellar arteries: Normal. --Basilar artery: Normal. --Superior cerebellar arteries: Normal. --Posterior cerebral arteries (PCA): Normal. ANTERIOR CIRCULATION: --Intracranial internal carotid arteries: Atherosclerotic calcification of the internal carotid arteries at the skull base without hemodynamically significant stenosis. --Anterior cerebral arteries (ACA): Normal. Both A1 segments are present. Patent anterior communicating artery (a-comm). --Middle cerebral arteries (MCA): Normal. VENOUS SINUSES: As permitted by contrast timing, patent. ANATOMIC VARIANTS: None Review of the MIP images confirms the above findings. IMPRESSION: 1. No emergent large vessel  occlusion or hemodynamically significant stenosis of the head or neck. 2. Mild left carotid bifurcation atherosclerosis without hemodynamically significant stenosis. 3.  Aortic atherosclerosis (ICD10-I70.0).  Electronically Signed   By: Ulyses Jarred M.D.   On: 05/05/2022 19:58   MR BRAIN WO CONTRAST  Result Date: 05/05/2022 CLINICAL DATA:  Left facial droop EXAM: MRI HEAD WITHOUT CONTRAST TECHNIQUE: Multiplanar, multiecho pulse sequences of the brain and surrounding structures were obtained without intravenous contrast. COMPARISON:  Same day CT brain FINDINGS: Brain: Smallacute infarct in the right external capsule (series 3, image 20). There is a focus of punctate microhemorrhage adjacent to this. With an associated small volume blood products. Small nodular focus along the posterior margin of the left ventricular atrium (series 6, image 16/series 3, image 30) is nonspecific and may represent a site ovary matter heterotopia. No hydrocpehalus. No extra axial fluid collection.Sequela of severe chronic microvascular ischemic change. Generalized volume loss. Partially empty sella Vascular: Normal flow voids. Skull and upper cervical spine: Normal marrow signal.There is likely at least moderate stenosis at the craniocervical junction secondary to a degenerative panus. Sinuses/Orbits: Bilateral lens replacement. Mucosal thickening right maxillary sinus. Other: None IMPRESSION: 1. Small focus of acute infarct in the right external capsule. There is a focus of microhemorrhage adjacent to this area of infarct, but not definitively associated with the infarct. 2. Small nodular focus along the posterior margin of the left ventricular atrium is nonspecific and may represent a site of gray matter heterotopia; recommend comparison with prior imaging. Electronically Signed   By: Marin Roberts M.D.   On: 05/05/2022 13:28   DG Chest Portable 1 View  Result Date: 05/05/2022 CLINICAL DATA:  Hypertension. EXAM: PORTABLE CHEST 1 VIEW COMPARISON:  Chest x-ray 02/01/2022. FINDINGS: Cardiomediastinal silhouette is mildly enlarged, probably similar to prior when accounting for differences in technique. No consolidation. No visible  pleural effusions or pneumothorax. No acute osseous abnormality. IMPRESSION: No evidence of acute cardiopulmonary disease. Electronically Signed   By: Margaretha Sheffield M.D.   On: 05/05/2022 12:15   CT Head Wo Contrast  Result Date: 05/05/2022 CLINICAL DATA:  Facial droop EXAM: CT HEAD WITHOUT CONTRAST TECHNIQUE: Contiguous axial images were obtained from the base of the skull through the vertex without intravenous contrast. RADIATION DOSE REDUCTION: This exam was performed according to the departmental dose-optimization program which includes automated exposure control, adjustment of the mA and/or kV according to patient size and/or use of iterative reconstruction technique. COMPARISON:  CT head February 01, 2022 FINDINGS: Brain: Ventricles and sulci are prominent compatible with atrophy. Periventricular and subcortical white matter hypodensities compatible with chronic microvascular ischemic changes. No evidence for acute cortically based infarct, intracranial hemorrhage, mass lesion or mass-effect. Vascular: No hyperdense vessel or unexpected calcification. Skull: Normal. Negative for fracture or focal lesion. Sinuses/Orbits: Paranasal sinuses are well aerated. Mucosal thickening involving the right maxillary sinus. Mastoid air cells are unremarkable. Other: None. IMPRESSION: 1. No acute intracranial process. 2. Atrophy and chronic microvascular ischemic changes. Electronically Signed   By: Lovey Newcomer M.D.   On: 05/05/2022 09:24        Scheduled Meds:  busPIRone  10 mg Oral TID   enoxaparin (LOVENOX) injection  40 mg Subcutaneous Q24H   labetalol  50 mg Oral BID   pantoprazole  40 mg Oral Daily   simvastatin  20 mg Oral QHS   Continuous Infusions:        Aline August, MD [facility] 05/06/2022, 11:47 AM

## 2022-05-06 NOTE — Evaluation (Signed)
Physical Therapy Evaluation Patient Details Name: Brenda Alexander MRN: [id number] DOB: 1933/05/24 Today's Date: 05/06/2022  History of Present Illness  Pt is a 88 y/o female presenting from [facility] SNF for L sided facial droop. 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 Possible UTI.  PMH includes: HTN, CAD, DM, dementia, arthritis.   Clinical Impression  Pt admitted with above. Per dtr who was present pt near baseline functionally and cognitively however reports rapid functional decline since March of 2023 when pt was living indep and using a rollator. Pt hasn't ambulated since April of 2023. Dtr expressed concern over w/c pt is currently inn and asked about getting her own w/c. CSW and case management notified. Pt remains appropriate to return SNF once medically stable. Acute PT to cont to follow.       Recommendations for follow up therapy are one component of a multi-disciplinary discharge planning process, led by the attending physician.  Recommendations may be updated based on patient status, additional functional criteria and insurance authorization.  Follow Up Recommendations Skilled nursing-short term rehab (<3 hours/day) (return to [place]) Can patient physically be transported by private vehicle: No    Assistance Recommended at Discharge Frequent or constant Supervision/Assistance  Patient can return home with the following  Two people to help with walking and/or transfers;Two people to help with bathing/dressing/bathroom;Direct supervision/assist for medications management;Direct supervision/assist for financial management;Assist for transportation;Help with stairs or ramp for entrance    Equipment Recommendations None recommended by PT (tbd at next venue)  Recommendations for Other Services       Functional Status Assessment Patient has had a recent decline in their functional status and/or demonstrates limited  ability to make significant improvements in function in a reasonable and predictable amount of time     Precautions / Restrictions Precautions Precautions: Fall Precaution Comments: watch HR Restrictions Weight Bearing Restrictions: No      Mobility  Bed Mobility Overal bed mobility: Needs Assistance Bed Mobility: Supine to Sit, Sit to Supine     Supine to sit: Total assist, +2 for physical assistance, +2 for safety/equipment Sit to supine: Total assist, +2 for physical assistance, +2 for safety/equipment   General bed mobility comments: pt able to initate some slight movement of LEs, but +2 total assist for full transition to EOB using bed pads    Transfers                   General transfer comment: deferred    Ambulation/Gait               General Gait Details: pt non-ambulatory since april of 2023  Stairs            Wheelchair Mobility    Modified Rankin (Stroke Patients Only)       Balance Overall balance assessment: Needs assistance Sitting-balance support: No upper extremity supported, Feet supported Sitting balance-Leahy Scale: Poor Sitting balance - Comments: relies on external support Postural control: Posterior lean                                   Pertinent Vitals/Pain Pain Assessment Pain Assessment: Faces Faces Pain Scale: No hurt    Home Living Family/patient expects to be discharged to:: Skilled nursing facility                   Additional Comments: from SNF,  long term care. has been at [facility] since April 2023    Prior Function Prior Level of Function : Needs assist             Mobility Comments: pt was ambulatory up until April of 2023, since pt has been at [facility] pt no longer amb and is dependent via lift for transfer in/out of w/c daily ADLs Comments: dependent for all ADLs including assist for eating     Hand Dominance   Dominant Hand: Right    Extremity/Trunk Assessment    Upper Extremity Assessment Upper Extremity Assessment: Defer to OT evaluation RUE Deficits / Details: arthritic changes, grossly 3-/5 MMT but able to bring hand to face, not overhead. hx tremors RUE Coordination: decreased fine motor;decreased gross motor LUE Deficits / Details: arthritic changes, grossly 3-/5 MMT but able to bring hand to face, not overhead, hx tremors LUE Coordination: decreased fine motor;decreased gross motor    Lower Extremity Assessment Lower Extremity Assessment: Generalized weakness (pt grossly 2/5, limited initiation of movement, pt with decreased bilat knee flexion passively, R worse than L)    Cervical / Trunk Assessment Cervical / Trunk Assessment: Kyphotic (shoulders very high up)  Communication   Communication: No difficulties (soft spoken)  Cognition Arousal/Alertness: Awake/alert Behavior During Therapy: WFL for tasks assessed/performed Overall Cognitive Status: History of cognitive impairments - at baseline                                 General Comments: hx of dementia, oriented to self, birthdate, and place.  follow simple commands with increased time, pleasant. dtr states she is back to baseline        General Comments General comments (skin integrity, edema, etc.): VSS on RA. HR 112bpm in supine, 126 at EOB, down to 97bpm upon return to supine, RN gave meds prior to PT/OT entering    Exercises     Assessment/Plan    PT Assessment Patient needs continued PT services  PT Problem List Decreased strength;Decreased range of motion;Decreased activity tolerance;Decreased balance;Decreased mobility;Decreased coordination;Decreased cognition       PT Treatment Interventions DME instruction;Gait training;Stair training;Functional mobility training;Therapeutic activities;Therapeutic exercise;Balance training;Neuromuscular re-education;Cognitive remediation    PT Goals (Current goals can be found in the Care Plan section)  Acute Rehab  PT Goals PT Goal Formulation: Patient unable to participate in goal setting Time For Goal Achievement: 05/20/22 Potential to Achieve Goals: Fair    Frequency Min 2X/week     Co-evaluation PT/OT/SLP Co-Evaluation/Treatment: Yes Reason for Co-Treatment: Necessary to address cognition/behavior during functional activity PT goals addressed during session: Mobility/safety with mobility OT goals addressed during session: ADL's and self-care       AM-PAC PT "6 Clicks" Mobility  Outcome Measure Help needed turning from your back to your side while in a flat bed without using bedrails?: Total Help needed moving from lying on your back to sitting on the side of a flat bed without using bedrails?: Total Help needed moving to and from a bed to a chair (including a wheelchair)?: Total Help needed standing up from a chair using your arms (e.g., wheelchair or bedside chair)?: Total Help needed to walk in hospital room?: Total Help needed climbing 3-5 steps with a railing? : Total 6 Click Score: 6    End of Session   Activity Tolerance: Patient tolerated treatment well Patient left: in bed;with bed alarm set;with call bell/phone within reach Nurse Communication: Mobility status  PT Visit Diagnosis: Muscle weakness (generalized) (M62.81);Difficulty in walking, not elsewhere classified (R26.2)    Time: 9295-7473 PT Time Calculation (min) (ACUTE ONLY): 21 min   Charges:   PT Evaluation $PT Eval Moderate Complexity: 1 Mod          Kittie Plater, PT, DPT Acute Rehabilitation Services Secure chat preferred Office #: [phone]   Berline Lopes 05/06/2022, 2:28 PM

## 2022-05-06 NOTE — Evaluation (Signed)
Occupational Therapy Evaluation Patient Details Name: Brenda Alexander MRN: [id number] DOB: May 26, 1933 Today's Date: 05/06/2022   History of Present Illness Pt is a 88 y/o female presenting from [facility] SNF for L sided facial droop. 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 Possible UTI.  PMH includes: HTN, CAD, DM, dementia, arthritis.   Clinical Impression   Patient admitted for above and presents with problem list below, including impaired balance, decreased activity tolerance, generalized weakness and limited functional  use to BUEs from arthritis at baseline with tremors. Pt from SNF, long term care, where she requires assist for all ADLs and assistance for transfers to wheelchair.  She requires total assist +2 for bed mobility today, assist to maintain sitting balance at EOB to engage in grooming tasks with min assist, total assist for all other Adls.  Based on performance today, believe pt is at baseline level for ADLs and no acute needs identified.  OT will sign off.       Recommendations for follow up therapy are one component of a multi-disciplinary discharge planning process, led by the attending physician.  Recommendations may be updated based on patient status, additional functional criteria and insurance authorization.   Follow Up Recommendations  Long-term institutional care without follow-up therapy    Assistance Recommended at Discharge Frequent or constant Supervision/Assistance  Patient can return home with the following Other (comment) (total care)    Functional Status Assessment     Equipment Recommendations  None recommended by OT    Recommendations for Other Services       Precautions / Restrictions Precautions Precautions: Fall Precaution Comments: watch HR Restrictions Weight Bearing Restrictions: No      Mobility Bed Mobility Overal bed mobility: Needs Assistance Bed Mobility: Supine to Sit,  Sit to Supine     Supine to sit: Total assist, +2 for physical assistance, +2 for safety/equipment Sit to supine: Total assist, +2 for physical assistance, +2 for safety/equipment   General bed mobility comments: pt able to initate some slight movement of LEs, but +2 total assist for full transition to EOB using bed pads    Transfers                   General transfer comment: deferred      Balance Overall balance assessment: Needs assistance Sitting-balance support: No upper extremity supported, Feet supported Sitting balance-Leahy Scale: Poor Sitting balance - Comments: relies on external support Postural control: Posterior lean                                 ADL either performed or assessed with clinical judgement   ADL Overall ADL's : At baseline                                       General ADL Comments: Assist for grooming, dressing. +2 at EOB.  Baseline needs assist with all ADls.     Vision Baseline Vision/History: 1 Wears glasses Ability to See in Adequate Light: 0 Adequate Patient Visual Report: No change from baseline Vision Assessment?: No apparent visual deficits     Perception     Praxis      Pertinent Vitals/Pain Pain Assessment Pain Assessment: Faces Faces Pain Scale: No hurt     Hand Dominance Right  Extremity/Trunk Assessment Upper Extremity Assessment Upper Extremity Assessment: Generalized weakness;RUE deficits/detail;LUE deficits/detail RUE Deficits / Details: arthritic changes, grossly 3-/5 MMT but able to bring hand to face, not overhead. hx tremors RUE Coordination: decreased fine motor;decreased gross motor LUE Deficits / Details: arthritic changes, grossly 3-/5 MMT but able to bring hand to face, not overhead, hx tremors LUE Coordination: decreased fine motor;decreased gross motor   Lower Extremity Assessment Lower Extremity Assessment: Defer to PT evaluation       Communication  Communication Communication: No difficulties   Cognition Arousal/Alertness: Awake/alert Behavior During Therapy: WFL for tasks assessed/performed Overall Cognitive Status: History of cognitive impairments - at baseline                                 General Comments: hx of dementia, oriented to self and place.  follow simple commands, pleasant.     General Comments  VSS on RA, HR EOB up to 126 but down to 97 supine at end of session    Exercises     Shoulder Instructions      Home Living Family/patient expects to be discharged to:: Skilled nursing facility                                 Additional Comments: from SNF, long term care.      Prior Functioning/Environment Prior Level of Function : Needs assist             Mobility Comments: w/c transfers with assist, slides out of w/c per daughter ADLs Comments: assist with all ADLs, even eating        OT Problem List: Decreased strength;Decreased range of motion;Decreased activity tolerance;Impaired balance (sitting and/or standing)      OT Treatment/Interventions:      OT Goals(Current goals can be found in the care plan section) Acute Rehab OT Goals Patient Stated Goal: none stated OT Goal Formulation: Patient unable to participate in goal setting  OT Frequency:      Co-evaluation PT/OT/SLP Co-Evaluation/Treatment: Yes Reason for Co-Treatment: Necessary to address cognition/behavior during functional activity;For patient/therapist safety;To address functional/ADL transfers   OT goals addressed during session: ADL's and self-care      AM-PAC OT "6 Clicks" Daily Activity     Outcome Measure Help from another person eating meals?: A Lot Help from another person taking care of personal grooming?: A Lot Help from another person toileting, which includes using toliet, bedpan, or urinal?: Total Help from another person bathing (including washing, rinsing, drying)?: Total Help from  another person to put on and taking off regular upper body clothing?: Total Help from another person to put on and taking off regular lower body clothing?: Total 6 Click Score: 8   End of Session Nurse Communication: Mobility status  Activity Tolerance: Patient tolerated treatment well Patient left: in bed;with call bell/phone within reach;with bed alarm set  OT Visit Diagnosis: Other abnormalities of gait and mobility (R26.89)                Time: 6568-1275 OT Time Calculation (min): 14 min Charges:  OT General Charges $OT Visit: 1 Visit OT Evaluation $OT Eval Moderate Complexity: 1 Mod  Jolaine Artist, OT Acute Rehabilitation Services Office [phone]   Delight Stare 05/06/2022, 12:47 PM

## 2022-05-07 DIAGNOSIS — F03918 Unspecified dementia, unspecified severity, with other behavioral disturbance: Secondary | ICD-10-CM | POA: Diagnosis not present

## 2022-05-07 DIAGNOSIS — I160 Hypertensive urgency: Secondary | ICD-10-CM | POA: Diagnosis not present

## 2022-05-07 DIAGNOSIS — I639 Cerebral infarction, unspecified: Secondary | ICD-10-CM | POA: Diagnosis not present

## 2022-05-07 DIAGNOSIS — N3000 Acute cystitis without hematuria: Secondary | ICD-10-CM | POA: Diagnosis not present

## 2022-05-07 LAB — URINE CULTURE: Culture: >=100000 — AB

## 2022-05-07 LAB — BASIC METABOLIC PANEL
Anion gap: 10 (ref 5–15)
BUN: 11 mg/dL (ref 8–23)
CO2: 25 mmol/L (ref 22–32)
Calcium: 9.7 mg/dL (ref 8.9–10.3)
Chloride: 101 mmol/L (ref 98–111)
Creatinine, Ser: 0.63 mg/dL (ref 0.44–1.00)
GFR, Estimated: >60 mL/min (ref >60)
Glucose, Bld: 128 mg/dL — ABNORMAL HIGH (ref 70–99)
Potassium: 3.7 mmol/L (ref 3.5–5.1)
Sodium: 136 mmol/L (ref 135–145)

## 2022-05-07 LAB — CBC WITH DIFFERENTIAL/PLATELET
Abs Immature Granulocytes: 0.01 10*3/uL (ref 0.00–0.07)
Basophils Absolute: 0 10*3/uL (ref 0.0–0.1)
Basophils Relative: 0 %
Eosinophils Absolute: 0 10*3/uL (ref 0.0–0.5)
Eosinophils Relative: 0 %
HCT: 35.9 % — ABNORMAL LOW (ref 36.0–46.0)
Hemoglobin: 12 g/dL (ref 12.0–15.0)
Immature Granulocytes: 0 %
Lymphocytes Relative: 12 %
Lymphs Abs: 0.6 10*3/uL — ABNORMAL LOW (ref 0.7–4.0)
MCH: 28.3 pg (ref 26.0–34.0)
MCHC: 33.4 g/dL (ref 30.0–36.0)
MCV: 84.7 fL (ref 80.0–100.0)
Monocytes Absolute: 0.6 10*3/uL (ref 0.1–1.0)
Monocytes Relative: 12 %
Neutro Abs: 3.4 10*3/uL (ref 1.7–7.7)
Neutrophils Relative %: 76 %
Platelets: 236 10*3/uL (ref 150–400)
RBC: 4.24 MIL/uL (ref 3.87–5.11)
RDW: 14.5 % (ref 11.5–15.5)
WBC: 4.5 10*3/uL (ref 4.0–10.5)
nRBC: 0 % (ref 0.0–0.2)

## 2022-05-07 LAB — MAGNESIUM: Magnesium: 1.8 mg/dL (ref 1.7–2.4)

## 2022-05-07 NOTE — Plan of Care (Signed)
  Problem: Pain Managment: Goal: General experience of comfort will improve Outcome: Progressing   Problem: Safety: Goal: Ability to remain free from injury will improve Outcome: Progressing   Problem: Skin Integrity: Goal: Risk for impaired skin integrity will decrease Outcome: Progressing   Problem: Education: Goal: Knowledge of patient specific risk factors will improve (INDIVIDUALIZE FOR PATIENT) Outcome: Progressing

## 2022-05-07 NOTE — Progress Notes (Signed)
PROGRESS NOTE    Brenda Alexander  HKV:[id number] DOB: 02-Oct-1932 DOA: 05/05/2022 PCP: Brenda Mile, NP   Brief Narrative:  88 y.o. female with medical history significant of history of hypertension, hyperlipidemia, CAD, non-insulin-dependent diabetes, dementia, mostly wheelchair-bound presented with left-sided facial droop.  On presentation, CT of the head was negative for acute abnormality.  MRI of brain showed small focus of acute infarction in the right external capsule with microhemorrhage adjacent to the area of infarct.  UA was concerning for UTI and she was given oral fosfomycin.  Neurology was consulted.  Assessment & Plan:   Acute CVA MRI of brain showed small focus of acute infarction in the right external capsule with microhemorrhage adjacent to the area of infarct CTA of head and neck was negative for significant occlusion/stenosis Neurology consulted, rec ASA, plavix for 3 weeks, then asa alone PT/OT/SLP evaluation LDL 104; hemoglobin A1c 5.3 2D echo showed EF of 65 to 70%, severe asymmetric left ventricular hypertrophy, left ventricular diastolic parameters are indeterminate Plan to discharge back to SNF-long-term care  Dementia with behavioral disturbance Physical deconditioning Mostly wheelchair-bound recently.  At baseline, usually alert and oriented to person and able to recognize family Fall/delirium precautions. PT eval  Hypertensive urgency Intermittently on the higher side likely worsened by generalized tremor Permissive hypertension Resumed labetalol because of ??tachycardia (unsure if this is worsened by tremors) Imdur, spironolactone on hold for now.  HLD Continue statin  Anemia of chronic disease From chronic illnesses.  Hemoglobin stable  Urinary tract infection: Present on admission Urine culture showed greater than 100,000 Aerococcus species Received 3 g fosfomycin in the ED We will touch base with pharmacy/ID for further susceptibility/appropriate  antibiotics   DVT prophylaxis: Lovenox Code Status: DNR Family Communication: None at bedside Disposition Plan: Status is: Observation The patient will require care spanning > 2 midnights and should be moved to inpatient because: Of severity of illness    Consultants: Neurology  Procedures: None  Antimicrobials: Fosfomycin 1 dose in the ED   Subjective: Patient seen and examined at bedside.  Patient noted to have generalized tremor, possibly affecting vital signs.  Daughter noted ??periorbital swelling, with some discharge from the eyes.  Patient denies any pain or vision loss.  Will monitor for now and reevaluate.    Objective: Vitals:   05/07/22 0700 05/07/22 1128 05/07/22 1328 05/07/22 1614  BP: (!) 151/112  (!) 176/86 (!) 154/85  Pulse:  (!) 109  100  Resp:  20  20  Temp: 98.7 F (37.1 C) 98.6 F (37 C)    TempSrc: Oral Oral    SpO2:    100%    Intake/Output Summary (Last 24 hours) at 05/07/2022 1915 Last data filed at 05/07/2022 0200 Gross per 24 hour  Intake --  Output 800 ml  Net -800 ml   There were no vitals filed for this visit.  Examination: General: NAD, somewhat disoriented, chronically ill-appearing, noted generalized tremor, worse on right upper extremity Cardiovascular: S1, S2 present Respiratory: Diminished breath sounds bilaterally Abdomen: Soft, nontender, nondistended, bowel sounds present Musculoskeletal: No bilateral pedal edema noted Skin: Normal Psychiatry: Normal mood     Data Reviewed: I have personally reviewed following labs and imaging studies  CBC: Recent Labs  Lab 05/05/22 0742 05/06/22 0228 05/07/22 0232  WBC 3.0* 4.5 4.5  NEUTROABS 1.8  --  3.4  HGB 10.7* 11.1* 12.0  HCT 33.7* 32.5* 35.9*  MCV 89.9 84.6 84.7  PLT 218 217 564   Basic Metabolic Panel: Recent  Labs  Lab 05/05/22 0742 05/07/22 0232  NA 138 136  K 4.0 3.7  CL 102 101  CO2 26 25  GLUCOSE 86 128*  BUN 17 11  CREATININE 0.66 0.63  CALCIUM 9.8  9.7  MG  --  1.8   GFR: CrCl cannot be calculated (Unknown ideal weight.). Liver Function Tests: Recent Labs  Lab 05/05/22 0742  AST 19  ALT 12  ALKPHOS 87  BILITOT 0.5  PROT 6.5  ALBUMIN 3.6   No results for input(s): "LIPASE", "AMYLASE" in the last 168 hours. No results for input(s): "AMMONIA" in the last 168 hours. Coagulation Profile: No results for input(s): "INR", "PROTIME" in the last 168 hours. Cardiac Enzymes: No results for input(s): "CKTOTAL", "CKMB", "CKMBINDEX", "TROPONINI" in the last 168 hours. BNP (last 3 results) No results for input(s): "PROBNP" in the last 8760 hours. HbA1C: Recent Labs    05/05/22 2124  HGBA1C 5.3   CBG: No results for input(s): "GLUCAP" in the last 168 hours. Lipid Profile: Recent Labs    05/05/22 2124  CHOL 202*  HDL 94  LDLCALC 104*  TRIG 22  CHOLHDL 2.1   Thyroid Function Tests: No results for input(s): "TSH", "T4TOTAL", "FREET4", "T3FREE", "THYROIDAB" in the last 72 hours. Anemia Panel: No results for input(s): "VITAMINB12", "FOLATE", "FERRITIN", "TIBC", "IRON", "RETICCTPCT" in the last 72 hours. Sepsis Labs: No results for input(s): "PROCALCITON", "LATICACIDVEN" in the last 168 hours.  Recent Results (from the past 240 hour(s))  Urine Culture     Status: Abnormal   Collection Time: 05/05/22  9:25 AM   Specimen: Urine, Clean Catch  Result Value Ref Range Status   Specimen Description URINE, CLEAN CATCH  Final   Special Requests NONE  Final   Culture (A)  Final    >=100,000 COLONIES/mL AEROCOCCUS SPECIES Standardized susceptibility testing for this organism is not available. Performed at [facility], [place] [address], [place], [place] [address]    Report Status 05/07/2022 FINAL  Final         Radiology Studies: ECHOCARDIOGRAM COMPLETE  Result Date: 05/06/2022    ECHOCARDIOGRAM REPORT   Patient Name:   Brenda Alexander Date of Exam: 05/06/2022 Medical Rec #:  [id number]   Height:       62.0 in Accession #:     [id number]  Weight:       150.4 lb Date of Birth:  11/25/1932   BSA:          1.694 m Patient Age:    88 years    BP:           145/76 mmHg Patient Gender: F           HR:           83 bpm. Exam Location:  Inpatient Procedure: 2D Echo, Color Doppler and Cardiac Doppler Indications:    Stroke  History:        Patient has prior history of Echocardiogram examinations, most                 recent 10/28/2021. CAD, Signs/Symptoms:Dyspnea; Risk                 Factors:Diabetes, Dyslipidemia and Hypertension.  Sonographer:    Memory Argue Referring Phys: [id number] RONDELL A SMITH IMPRESSIONS  1. Left ventricular ejection fraction, by estimation, is 65 to 70%. The left ventricle has normal function. The left ventricle has no regional wall motion abnormalities. There is severe asymmetric left ventricular hypertrophy of  the basal-septal segment  (basal septal hypertrophy). Left ventricular diastolic parameters are indeterminate.  2. Right ventricular systolic function is normal. The right ventricular size is normal. There is severely elevated pulmonary artery systolic pressure.  3. Left atrial size was severely dilated.  4. The mitral valve is degenerative. No evidence of mitral valve regurgitation. Mild mitral stenosis. The mean mitral valve gradient is 8.0 mmHg with average heart rate of 110 bpm. Severe mitral annular calcification.  5. The aortic valve is calcified. Aortic valve regurgitation is mild. Aortic valve sclerosis/calcification is present, without any evidence of aortic stenosis. Normal stroke volume index and DVI.  6. The inferior vena cava is dilated in size with >50% respiratory variability, suggesting right atrial pressure of 8 mmHg. Comparison(s): Significant increase in mitral valve gradients at higher heart rates. FINDINGS  Left Ventricle: Left ventricular ejection fraction, by estimation, is 65 to 70%. The left ventricle has normal function. The left ventricle has no regional wall motion abnormalities. The  left ventricular internal cavity size was small. There is severe asymmetric left ventricular hypertrophy of the basal-septal segment. Left ventricular diastolic parameters are indeterminate. Right Ventricle: The right ventricular size is normal. No increase in right ventricular wall thickness. Right ventricular systolic function is normal. There is severely elevated pulmonary artery systolic pressure. The tricuspid regurgitant velocity is 3.62 m/s, and with an assumed right atrial pressure of 8 mmHg, the estimated right ventricular systolic pressure is 56.3 mmHg. Left Atrium: Left atrial size was severely dilated. Right Atrium: Right atrial size was normal in size. Pericardium: There is no evidence of pericardial effusion. Mitral Valve: MVA 3.65 cm2 by 2D planimetry; Mitral Valve Area (MVA) = 1.74 cm2 anterior mitral valve prolapse. The mitral valve is degenerative in appearance. Severe mitral annular calcification. No evidence of mitral valve regurgitation. Mild mitral valve stenosis. MV peak gradient, 11.0 mmHg. The mean mitral valve gradient is 8.0 mmHg with average heart rate of 110 bpm. Tricuspid Valve: The tricuspid valve is normal in structure. Tricuspid valve regurgitation is mild . No evidence of tricuspid stenosis. Aortic Valve: The aortic valve is calcified. Aortic valve regurgitation is mild. Aortic regurgitation PHT measures 412 msec. Aortic valve sclerosis/calcification is present, without any evidence of aortic stenosis. Aortic valve mean gradient measures 10.0 mmHg. Aortic valve peak gradient measures 18.5 mmHg. Aortic valve area, by VTI measures 1.95 cm. Pulmonic Valve: The pulmonic valve was normal in structure. Pulmonic valve regurgitation is not visualized. No evidence of pulmonic stenosis. Aorta: The aortic root and ascending aorta are structurally normal, with no evidence of dilitation. Venous: The inferior vena cava is dilated in size with greater than 50% respiratory variability, suggesting  right atrial pressure of 8 mmHg. IAS/Shunts: The interatrial septum is aneurysmal. No atrial level shunt detected by color flow Doppler.  LEFT VENTRICLE PLAX 2D LVIDd:         3.70 cm LVIDs:         2.40 cm LV PW:         1.30 cm LV IVS:        1.20 cm LVOT diam:     2.20 cm LV SV:         79 LV SV Index:   47 LVOT Area:     3.80 cm  RIGHT VENTRICLE TAPSE (M-mode): 2.0 cm LEFT ATRIUM             Index        RIGHT ATRIUM  Index LA diam:        4.30 cm 2.54 cm/m   RA Area:     13.40 cm LA Vol (A2C):   88.8 ml 52.43 ml/m  RA Volume:   26.90 ml  15.88 ml/m LA Vol (A4C):   95.8 ml 56.57 ml/m LA Biplane Vol: 92.4 ml 54.56 ml/m  AORTIC VALVE AV Area (Vmax):    2.02 cm AV Area (Vmean):   1.97 cm AV Area (VTI):     1.95 cm AV Vmax:           215.00 cm/s AV Vmean:          147.000 cm/s AV VTI:            0.406 m AV Peak Grad:      18.5 mmHg AV Mean Grad:      10.0 mmHg LVOT Vmax:         114.00 cm/s LVOT Vmean:        76.300 cm/s LVOT VTI:          0.208 m LVOT/AV VTI ratio: 0.51 AI PHT:            412 msec  AORTA Ao Root diam: 3.50 cm Ao Asc diam:  3.50 cm MITRAL VALVE              TRICUSPID VALVE MV Area VTI:  1.83 cm    TR Peak grad:   52.4 mmHg MV Peak grad: 11.0 mmHg   TR Vmax:        362.00 cm/s MV Mean grad: 8.0 mmHg MV Vmax:      1.66 m/s    SHUNTS MV Vmean:     127.0 cm/s  Systemic VTI:  0.21 m                           Systemic Diam: 2.20 cm Rudean Haskell MD Electronically signed by Rudean Haskell MD Signature Date/Time: 05/06/2022/9:56:36 AM    Final    CT ANGIO HEAD NECK W WO CM  Result Date: 05/05/2022 CLINICAL DATA:  Stroke/TIA EXAM: CT ANGIOGRAPHY HEAD AND NECK TECHNIQUE: Multidetector CT imaging of the head and neck was performed using the standard protocol during bolus administration of intravenous contrast. Multiplanar CT image reconstructions and MIPs were obtained to evaluate the vascular anatomy. Carotid stenosis measurements (when applicable) are obtained utilizing  NASCET criteria, using the distal internal carotid diameter as the denominator. RADIATION DOSE REDUCTION: This exam was performed according to the departmental dose-optimization program which includes automated exposure control, adjustment of the mA and/or kV according to patient size and/or use of iterative reconstruction technique. CONTRAST:  77m OMNIPAQUE IOHEXOL 350 MG/ML SOLN COMPARISON:  None Available. FINDINGS: CTA NECK FINDINGS SKELETON: There is no bony spinal canal stenosis. No lytic or blastic lesion. OTHER NECK: Normal pharynx, larynx and major salivary glands. No cervical lymphadenopathy. Unremarkable thyroid gland. UPPER CHEST: No pneumothorax or pleural effusion. No nodules or masses. AORTIC ARCH: There is calcific atherosclerosis of the aortic arch. There is no aneurysm, dissection or hemodynamically significant stenosis of the visualized portion of the aorta. Conventional 3 vessel aortic branching pattern. The visualized proximal subclavian arteries are widely patent. RIGHT CAROTID SYSTEM: Normal without aneurysm, dissection or stenosis. LEFT CAROTID SYSTEM: No dissection, occlusion or aneurysm. Mild atherosclerotic calcification at the carotid bifurcation without hemodynamically significant stenosis. VERTEBRAL ARTERIES: Left dominant configuration. Both origins are clearly patent. There is no dissection, occlusion or flow-limiting stenosis to  the skull base (V1-V3 segments). CTA HEAD FINDINGS POSTERIOR CIRCULATION: --Vertebral arteries: Normal V4 segments. --Inferior cerebellar arteries: Normal. --Basilar artery: Normal. --Superior cerebellar arteries: Normal. --Posterior cerebral arteries (PCA): Normal. ANTERIOR CIRCULATION: --Intracranial internal carotid arteries: Atherosclerotic calcification of the internal carotid arteries at the skull base without hemodynamically significant stenosis. --Anterior cerebral arteries (ACA): Normal. Both A1 segments are present. Patent anterior communicating  artery (a-comm). --Middle cerebral arteries (MCA): Normal. VENOUS SINUSES: As permitted by contrast timing, patent. ANATOMIC VARIANTS: None Review of the MIP images confirms the above findings. IMPRESSION: 1. No emergent large vessel occlusion or hemodynamically significant stenosis of the head or neck. 2. Mild left carotid bifurcation atherosclerosis without hemodynamically significant stenosis. 3.  Aortic atherosclerosis (ICD10-I70.0). Electronically Signed   By: Ulyses Jarred M.D.   On: 05/05/2022 19:58        Scheduled Meds:  aspirin  81 mg Oral Daily   busPIRone  10 mg Oral TID   clopidogrel  75 mg Oral Daily   enoxaparin (LOVENOX) injection  40 mg Subcutaneous Q24H   labetalol  50 mg Oral BID   pantoprazole  40 mg Oral Daily   simvastatin  40 mg Oral QHS   Continuous Infusions:        Alma Friendly, MD [facility] 05/07/2022, 7:15 PM

## 2022-05-08 ENCOUNTER — Observation Stay (HOSPITAL_COMMUNITY): Payer: Medicare HMO

## 2022-05-08 DIAGNOSIS — F03918 Unspecified dementia, unspecified severity, with other behavioral disturbance: Secondary | ICD-10-CM | POA: Diagnosis not present

## 2022-05-08 DIAGNOSIS — I639 Cerebral infarction, unspecified: Secondary | ICD-10-CM | POA: Diagnosis not present

## 2022-05-08 DIAGNOSIS — R5381 Other malaise: Secondary | ICD-10-CM | POA: Diagnosis not present

## 2022-05-08 DIAGNOSIS — E785 Hyperlipidemia, unspecified: Secondary | ICD-10-CM | POA: Diagnosis not present

## 2022-05-08 LAB — BASIC METABOLIC PANEL
Anion gap: 14 (ref 5–15)
BUN: 12 mg/dL (ref 8–23)
CO2: 25 mmol/L (ref 22–32)
Calcium: 10.2 mg/dL (ref 8.9–10.3)
Chloride: 99 mmol/L (ref 98–111)
Creatinine, Ser: 0.75 mg/dL (ref 0.44–1.00)
GFR, Estimated: >60 mL/min (ref >60)
Glucose, Bld: 105 mg/dL — ABNORMAL HIGH (ref 70–99)
Potassium: 3.5 mmol/L (ref 3.5–5.1)
Sodium: 138 mmol/L (ref 135–145)

## 2022-05-08 LAB — CBC WITH DIFFERENTIAL/PLATELET
Abs Immature Granulocytes: 0.01 10*3/uL (ref 0.00–0.07)
Basophils Absolute: 0 10*3/uL (ref 0.0–0.1)
Basophils Relative: 0 %
Eosinophils Absolute: 0 10*3/uL (ref 0.0–0.5)
Eosinophils Relative: 1 %
HCT: 38.2 % (ref 36.0–46.0)
Hemoglobin: 12.9 g/dL (ref 12.0–15.0)
Immature Granulocytes: 0 %
Lymphocytes Relative: 13 %
Lymphs Abs: 0.8 10*3/uL (ref 0.7–4.0)
MCH: 28.5 pg (ref 26.0–34.0)
MCHC: 33.8 g/dL (ref 30.0–36.0)
MCV: 84.5 fL (ref 80.0–100.0)
Monocytes Absolute: 0.7 10*3/uL (ref 0.1–1.0)
Monocytes Relative: 12 %
Neutro Abs: 4.5 10*3/uL (ref 1.7–7.7)
Neutrophils Relative %: 74 %
Platelets: 248 10*3/uL (ref 150–400)
RBC: 4.52 MIL/uL (ref 3.87–5.11)
RDW: 14.3 % (ref 11.5–15.5)
WBC: 6.1 10*3/uL (ref 4.0–10.5)
nRBC: 0 % (ref 0.0–0.2)

## 2022-05-08 LAB — GLUCOSE, CAPILLARY: Glucose-Capillary: 145 mg/dL — ABNORMAL HIGH (ref 70–99)

## 2022-05-08 MED ORDER — CLOPIDOGREL BISULFATE 75 MG PO TABS: 75.0000 mg | ORAL_TABLET | Freq: Every day | ORAL | Status: DC

## 2022-05-08 MED ORDER — LORAZEPAM 0.5 MG PO TABS: 0.5000 mg | ORAL_TABLET | Freq: Every day | ORAL | 0 refills | Status: DC | PRN

## 2022-05-08 MED ORDER — PANTOPRAZOLE SODIUM 40 MG PO TBEC: 40.0000 mg | DELAYED_RELEASE_TABLET | Freq: Every day | ORAL | Status: DC

## 2022-05-08 MED ORDER — ONDANSETRON HCL 4 MG/2ML IJ SOLN
4.0000 mg | Freq: Once | INTRAMUSCULAR | Status: AC
  Administered 2022-05-08: 4 mg via INTRAVENOUS
  Filled 2022-05-08: qty 2

## 2022-05-08 MED ORDER — SIMVASTATIN 20 MG PO TABS: 40.0000 mg | ORAL_TABLET | Freq: Every day | ORAL | Status: DC

## 2022-05-08 MED ORDER — ASPIRIN 81 MG PO CHEW: 81.0000 mg | CHEWABLE_TABLET | Freq: Every day | ORAL | Status: DC

## 2022-05-08 NOTE — Progress Notes (Signed)
Transported by Corey Harold to [facility].  Had a BM prior to discharge and was cleaned.  IV access removed,.  No signs of distress.

## 2022-05-08 NOTE — Progress Notes (Signed)
Patient reporting nausea and is vomiting, very sweaty with low grade temp of 99.2, bp 164/82. Patient is also reporting she is feeling like she is falling and dizzy but she is laying in the bed.Notified Md, new orders received

## 2022-05-08 NOTE — Plan of Care (Signed)
  Problem: Education: Goal: Knowledge of General Education information will improve Description: Including pain rating scale, medication(s)/side effects and non-pharmacologic comfort measures Outcome: Adequate for Discharge   Problem: Health Behavior/Discharge Planning: Goal: Ability to manage health-related needs will improve Outcome: Adequate for Discharge   Problem: Clinical Measurements: Goal: Ability to maintain clinical measurements within normal limits will improve Outcome: Adequate for Discharge Goal: Will remain free from infection Outcome: Adequate for Discharge Goal: Diagnostic test results will improve Outcome: Adequate for Discharge Goal: Respiratory complications will improve Outcome: Adequate for Discharge Goal: Cardiovascular complication will be avoided Outcome: Adequate for Discharge   Problem: Activity: Goal: Risk for activity intolerance will decrease Outcome: Adequate for Discharge   Problem: Nutrition: Goal: Adequate nutrition will be maintained Outcome: Adequate for Discharge   Problem: Coping: Goal: Level of anxiety will decrease Outcome: Adequate for Discharge   Problem: Elimination: Goal: Will not experience complications related to bowel motility Outcome: Adequate for Discharge Goal: Will not experience complications related to urinary retention Outcome: Adequate for Discharge   Problem: Pain Managment: Goal: General experience of comfort will improve Outcome: Adequate for Discharge   Problem: Safety: Goal: Ability to remain free from injury will improve Outcome: Adequate for Discharge   Problem: Skin Integrity: Goal: Risk for impaired skin integrity will decrease Outcome: Adequate for Discharge   Problem: Education: Goal: Knowledge of disease or condition will improve Outcome: Adequate for Discharge Goal: Knowledge of secondary prevention will improve (SELECT ALL) Outcome: Adequate for Discharge Goal: Knowledge of patient specific  risk factors will improve (INDIVIDUALIZE FOR PATIENT) Outcome: Adequate for Discharge

## 2022-05-08 NOTE — TOC Transition Note (Signed)
Transition of Care [facility]) - CM/SW Discharge Note   Patient Details  Name: Brenda Alexander MRN: [id number] Date of Birth: October 12, 1932  Transition of Care [facility]) CM/SW Contact:  Geralynn Ochs, LCSW Phone Number: 05/08/2022, 4:43 PM   Clinical Narrative:   CSW updated by MD that patient is medically stable for transition back to SNF. CSW spoke with daughter, Michela Pitcher, who was asking for transfer to happen after she was done with work so she didn't have to take time off. Family aware that they need to take the wheelchair to [facility] themselves. CSW sent discharge information to [place]. Transport scheduled with [facility] for next available.  Nurse to call report to [phone].    Final next level of care: [place] Barriers to Discharge: Barriers Resolved   Patient Goals and CMS Choice Patient states their goals for this hospitalization and ongoing recovery are:: get back to [email or web address].gov Compare Post Acute Care list provided to:: Patient Choice offered to / list presented to : Patient, Adult Children  Discharge Placement              Patient chooses bed at: [facility] Patient to be transferred to facility by: [place] Name of family member notified: Nanette Patient and family notified of of transfer: 05/08/22  Discharge Plan and Services     Post Acute Care Choice: [place]                               Social Determinants of Health (SDOH) Interventions     Readmission Risk Interventions     No data to display

## 2022-05-08 NOTE — Progress Notes (Signed)
Called report to [place].  All questions answered.  Daughter was at the bedside and took w/c to take to facility and also to ensure her dentures were in her mouth and her glasses were on her face.  Both confirmed my this nurse.  No signs of distress.  Resp even and unlabored.

## 2022-05-08 NOTE — Plan of Care (Signed)
  Problem: Clinical Measurements: Goal: Respiratory complications will improve Outcome: Progressing Goal: Cardiovascular complication will be avoided Outcome: Progressing   Problem: Activity: Goal: Risk for activity intolerance will decrease Outcome: Progressing   Problem: Nutrition: Goal: Adequate nutrition will be maintained Outcome: Progressing   

## 2022-05-08 NOTE — Discharge Summary (Signed)
Physician Discharge Summary   Patient: Brenda Alexander MRN: [id number] DOB: Oct 01, 1932  Admit date:     05/05/2022  Discharge date: 05/08/22  Discharge Physician: Alma Friendly   PCP: [name], NP   Recommendations at discharge:   Follow-up with PCP in 1 week Follow-up with ophthalmology as soon as possible  Discharge Diagnoses: Principal Problem:   CVA (cerebral vascular accident) [facility]) Active Problems:   Dementia with behavioral disturbance ([place])   Hypertensive urgency   Pancytopenia ([place])   HLD (hyperlipidemia)   UTI (urinary tract infection)   Debility    Hospital Course: 88 y.o. female with medical history significant of history of hypertension, hyperlipidemia, CAD, non-insulin-dependent diabetes, dementia, mostly wheelchair-bound presented with left-sided facial droop.  On presentation, CT of the head was negative for acute abnormality.  MRI of brain showed small focus of acute infarction in the right external capsule with microhemorrhage adjacent to the area of infarct.  UA was concerning for UTI and she was given oral fosfomycin.  Neurology was consulted.   Today, patient denies any new complaints.  Noted persistent tremors.    Assessment and Plan:  Acute CVA MRI of brain showed small focus of acute infarction in the right external capsule with microhemorrhage adjacent to the area of infarct CTA of head and neck was negative for significant occlusion/stenosis Neurology consulted, rec ASA and plavix for 3 weeks, then asa alone PT/OT/SLP evaluation LDL 104; hemoglobin A1c 5.3 2D echo showed EF of 65 to 70%, severe asymmetric left ventricular hypertrophy, left ventricular diastolic parameters are indeterminate Plan to discharge back to SNF-long-term care   Dementia with behavioral disturbance Physical deconditioning Mostly wheelchair-bound recently.  At baseline, usually alert and oriented to person and able to recognize family Fall/delirium precautions    Hypertensive urgency Improved-BP needs to be checked manually preferably on the left upper extremity for more accurate readings due to noted tremors Continue PTA labetalol, clonidine, Imdur and Aldactone for now and adjust accordingly   HLD Continue statin, increased dose to 40 mg daily   Anemia of chronic disease From chronic illnesses.  Hemoglobin stable   Urinary tract infection: Present on admission Urine culture showed greater than 100,000 Aerococcus species Received 3 g fosfomycin in the ED Discussed with pharmacy, 3 g of fosfomycin should be sufficient to cover  Bilateral eye drainage Noted eye drainage from bilateral eyes, no noted redness, patient denies any pain in either eyes. Patient had cataract surgery recently as per daughter and was prescribed some eyedrops which are not on patient's med rec. Advised to continue with eyedrops and follow-up closely with patient's ophthalmologist, daughter verbalized understanding   Pain control - [place] Controlled Substance Reporting System database was reviewed. and patient was instructed, not to drive, operate heavy machinery, perform activities at heights, swimming or participation in water activities or provide baby-sitting services while on Pain, Sleep and Anxiety Medications; until their outpatient Physician has advised to do so again. Also recommended to not to take more than prescribed Pain, Sleep and Anxiety Medications.    Consultants: Neurology Procedures performed: None Disposition: Skilled nursing facility Diet recommendation:  Cardiac diet   DISCHARGE MEDICATION: Allergies as of 05/08/2022       Reactions   Other Anaphylaxis, Swelling   Walnuts = Throat swells   Penicillins Swelling, Rash   Has patient had a PCN reaction causing immediate rash, facial/tongue/throat swelling, SOB or lightheadedness with hypotension: Yes Has patient had a PCN reaction causing severe rash involving mucus membranes or  skin  necrosis: Unk Has patient had a PCN reaction that required hospitalization: Unk Has patient had a PCN reaction occurring within the last 10 years: No If all of the above answers are "NO", then may proceed with Cephalosporin use.        Medication List     STOP taking these medications    omeprazole 20 MG capsule Commonly known as: PRILOSEC Replaced by: pantoprazole 40 MG tablet       TAKE these medications    acetaminophen 500 MG tablet Commonly known as: TYLENOL Take 1,000 mg by mouth every 6 (six) hours as needed (for pain).   ascorbic acid 500 MG tablet Commonly known as: VITAMIN C Take 500 mg by mouth daily.   aspirin 81 MG chewable tablet Chew 1 tablet (81 mg total) by mouth daily. Start taking on: May 09, 2022   busPIRone 10 MG tablet Commonly known as: BUSPAR Take 10 mg by mouth 3 (three) times daily.   CALCIUM + D3 PO Take 1 tablet by mouth daily.   citalopram 10 MG tablet Commonly known as: CELEXA Take 10 mg by mouth daily.   cloNIDine 0.2 MG tablet Commonly known as: CATAPRES Take 0.2 mg by mouth 2 (two) times daily.   clopidogrel 75 MG tablet Commonly known as: PLAVIX Take 1 tablet (75 mg total) by mouth daily. Start taking on: May 09, 2022   docusate sodium 100 MG capsule Commonly known as: COLACE Take 1 capsule (100 mg total) by mouth 2 (two) times daily as needed for mild constipation or moderate constipation.   Ensure Original Liqd Take 1 Bottle by mouth at bedtime.   ferrous sulfate 325 (65 FE) MG EC tablet Take 1 tablet (325 mg total) by mouth 2 (two) times daily.   isosorbide mononitrate 20 MG tablet Commonly known as: ISMO Take 20 mg by mouth daily.   labetalol 100 MG tablet Commonly known as: NORMODYNE Take 0.5 tablets (50 mg total) by mouth 2 (two) times daily.   levETIRAcetam 250 MG tablet Commonly known as: KEPPRA Take 250 mg by mouth 2 (two) times daily.   LORazepam 0.5 MG tablet Commonly known as:  ATIVAN Take 1 tablet (0.5 mg total) by mouth daily as needed for anxiety.   multivitamin tablet Take 1 tablet by mouth daily.   pantoprazole 40 MG tablet Commonly known as: PROTONIX Take 1 tablet (40 mg total) by mouth daily. Start taking on: May 09, 2022 Replaces: omeprazole 20 MG capsule   simvastatin 20 MG tablet Commonly known as: ZOCOR Take 2 tablets (40 mg total) by mouth at bedtime. What changed: how much to take   spironolactone 25 MG tablet Commonly known as: ALDACTONE Take 0.5 tablets (12.5 mg total) by mouth daily.               Durable Medical Equipment  (From admission, onward)           Start     Ordered   05/06/22 1347  For home use only DME lightweight manual wheelchair with seat cushion  Once       Comments: Patient suffers from stroke which impairs their ability to perform daily activities like bathing, dressing, and toileting in the home.  A walker will not resolve  issue with performing activities of daily living. A wheelchair will allow patient to safely perform daily activities. Patient is not able to propel themselves in the home using a standard weight wheelchair due to general weakness. Patient can self propel  in the lightweight wheelchair. Length of need Lifetime. Accessories: elevating leg rests (ELRs), wheel locks, extensions and anti-tippers.   05/06/22 1347            Contact information for follow-up providers     [name], NP. Schedule an appointment as soon as possible for a visit in 1 week(s).   Contact information: [place] [address] [phone]              Contact information for after-discharge care     Destination     [facility] Preferred SNF .   Service: Skilled Nursing Contact information: [address] [place] [phone]                    Discharge Exam: There were no vitals filed for this visit. General:  NAD, noted tremor, bilateral eye with minimal discharge Cardiovascular: S1, S2 present Respiratory: CTAB Abdomen: Soft, nontender, nondistended, bowel sounds present Musculoskeletal: No bilateral pedal edema noted Skin: Normal Psychiatry: Normal mood   Condition at discharge: stable  The results of significant diagnostics from this hospitalization (including imaging, microbiology, ancillary and laboratory) are listed below for reference.   Imaging Studies: ECHOCARDIOGRAM COMPLETE  Result Date: 05/06/2022    ECHOCARDIOGRAM REPORT   Patient Name:   Brenda Alexander Date of Exam: 05/06/2022 Medical Rec #:  [id number]   Height:       62.0 in Accession #:    [id number]  Weight:       150.4 lb Date of Birth:  01-18-1933   BSA:          1.694 m Patient Age:    37 years    BP:           145/76 mmHg Patient Gender: F           HR:           83 bpm. Exam Location:  Inpatient Procedure: 2D Echo, Color Doppler and Cardiac Doppler Indications:    Stroke  History:        Patient has prior history of Echocardiogram examinations, most                 recent 10/28/2021. CAD, Signs/Symptoms:Dyspnea; Risk                 Factors:Diabetes, Dyslipidemia and Hypertension.  Sonographer:    Memory Argue Referring Phys: [id number] RONDELL A SMITH IMPRESSIONS  1. Left ventricular ejection fraction, by estimation, is 65 to 70%. The left ventricle has normal function. The left ventricle has no regional wall motion abnormalities. There is severe asymmetric left ventricular hypertrophy of the basal-septal segment  (basal septal hypertrophy). Left ventricular diastolic parameters are indeterminate.  2. Right ventricular systolic function is normal. The right ventricular size is normal. There is severely elevated pulmonary artery systolic pressure.  3. Left atrial size was severely dilated.  4. The mitral valve is degenerative. No evidence of mitral valve regurgitation. Mild mitral stenosis. The mean mitral valve gradient is 8.0 mmHg with  average heart rate of 110 bpm. Severe mitral annular calcification.  5. The aortic valve is calcified. Aortic valve regurgitation is mild. Aortic valve sclerosis/calcification is present, without any evidence of aortic stenosis. Normal stroke volume index and DVI.  6. The inferior vena cava is dilated in size with >50% respiratory variability, suggesting right atrial pressure of 8 mmHg. Comparison(s): Significant increase in mitral valve gradients at higher heart rates. FINDINGS  Left Ventricle: Left ventricular ejection fraction, by estimation, is 65 to 70%. The left ventricle has normal function. The left ventricle has no regional wall motion abnormalities. The left ventricular internal cavity size was small. There is severe asymmetric left ventricular hypertrophy of the basal-septal segment. Left ventricular diastolic parameters are indeterminate. Right Ventricle: The right ventricular size is normal. No increase in right ventricular wall thickness. Right ventricular systolic function is normal. There is severely elevated pulmonary artery systolic pressure. The tricuspid regurgitant velocity is 3.62 m/s, and with an assumed right atrial pressure of 8 mmHg, the estimated right ventricular systolic pressure is 24.4 mmHg. Left Atrium: Left atrial size was severely dilated. Right Atrium: Right atrial size was normal in size. Pericardium: There is no evidence of pericardial effusion. Mitral Valve: MVA 3.65 cm2 by 2D planimetry; Mitral Valve Area (MVA) = 1.74 cm2 anterior mitral valve prolapse. The mitral valve is degenerative in appearance. Severe mitral annular calcification. No evidence of mitral valve regurgitation. Mild mitral valve stenosis. MV peak gradient, 11.0 mmHg. The mean mitral valve gradient is 8.0 mmHg with average heart rate of 110 bpm. Tricuspid Valve: The tricuspid valve is normal in structure. Tricuspid valve regurgitation is mild . No evidence of tricuspid stenosis. Aortic Valve: The aortic valve  is calcified. Aortic valve regurgitation is mild. Aortic regurgitation PHT measures 412 msec. Aortic valve sclerosis/calcification is present, without any evidence of aortic stenosis. Aortic valve mean gradient measures 10.0 mmHg. Aortic valve peak gradient measures 18.5 mmHg. Aortic valve area, by VTI measures 1.95 cm. Pulmonic Valve: The pulmonic valve was normal in structure. Pulmonic valve regurgitation is not visualized. No evidence of pulmonic stenosis. Aorta: The aortic root and ascending aorta are structurally normal, with no evidence of dilitation. Venous: The inferior vena cava is dilated in size with greater than 50% respiratory variability, suggesting right atrial pressure of 8 mmHg. IAS/Shunts: The interatrial septum is aneurysmal. No atrial level shunt detected by color flow Doppler.  LEFT VENTRICLE PLAX 2D LVIDd:         3.70 cm LVIDs:         2.40 cm LV PW:         1.30 cm LV IVS:        1.20 cm LVOT diam:     2.20 cm LV SV:         79 LV SV Index:   47 LVOT Area:     3.80 cm  RIGHT VENTRICLE TAPSE (M-mode): 2.0 cm LEFT ATRIUM             Index        RIGHT ATRIUM           Index LA diam:        4.30 cm 2.54 cm/m   RA Area:     13.40 cm LA Vol (A2C):   88.8 ml 52.43 ml/m  RA Volume:   26.90 ml  15.88 ml/m LA Vol (A4C):   95.8 ml 56.57 ml/m LA Biplane Vol: 92.4 ml 54.56 ml/m  AORTIC VALVE AV Area (Vmax):    2.02 cm AV Area (Vmean):   1.97 cm AV Area (VTI):     1.95 cm AV Vmax:           215.00 cm/s AV Vmean:          147.000 cm/s AV VTI:            0.406 m AV Peak Grad:      18.5 mmHg AV Mean  Grad:      10.0 mmHg LVOT Vmax:         114.00 cm/s LVOT Vmean:        76.300 cm/s LVOT VTI:          0.208 m LVOT/AV VTI ratio: 0.51 AI PHT:            412 msec  AORTA Ao Root diam: 3.50 cm Ao Asc diam:  3.50 cm MITRAL VALVE              TRICUSPID VALVE MV Area VTI:  1.83 cm    TR Peak grad:   52.4 mmHg MV Peak grad: 11.0 mmHg   TR Vmax:        362.00 cm/s MV Mean grad: 8.0 mmHg MV Vmax:      1.66  m/s    SHUNTS MV Vmean:     127.0 cm/s  Systemic VTI:  0.21 m                           Systemic Diam: 2.20 cm Rudean Haskell MD Electronically signed by Rudean Haskell MD Signature Date/Time: 05/06/2022/9:56:36 AM    Final    CT ANGIO HEAD NECK W WO CM  Result Date: 05/05/2022 CLINICAL DATA:  Stroke/TIA EXAM: CT ANGIOGRAPHY HEAD AND NECK TECHNIQUE: Multidetector CT imaging of the head and neck was performed using the standard protocol during bolus administration of intravenous contrast. Multiplanar CT image reconstructions and MIPs were obtained to evaluate the vascular anatomy. Carotid stenosis measurements (when applicable) are obtained utilizing NASCET criteria, using the distal internal carotid diameter as the denominator. RADIATION DOSE REDUCTION: This exam was performed according to the departmental dose-optimization program which includes automated exposure control, adjustment of the mA and/or kV according to patient size and/or use of iterative reconstruction technique. CONTRAST:  8m OMNIPAQUE IOHEXOL 350 MG/ML SOLN COMPARISON:  None Available. FINDINGS: CTA NECK FINDINGS SKELETON: There is no bony spinal canal stenosis. No lytic or blastic lesion. OTHER NECK: Normal pharynx, larynx and major salivary glands. No cervical lymphadenopathy. Unremarkable thyroid gland. UPPER CHEST: No pneumothorax or pleural effusion. No nodules or masses. AORTIC ARCH: There is calcific atherosclerosis of the aortic arch. There is no aneurysm, dissection or hemodynamically significant stenosis of the visualized portion of the aorta. Conventional 3 vessel aortic branching pattern. The visualized proximal subclavian arteries are widely patent. RIGHT CAROTID SYSTEM: Normal without aneurysm, dissection or stenosis. LEFT CAROTID SYSTEM: No dissection, occlusion or aneurysm. Mild atherosclerotic calcification at the carotid bifurcation without hemodynamically significant stenosis. VERTEBRAL ARTERIES: Left dominant  configuration. Both origins are clearly patent. There is no dissection, occlusion or flow-limiting stenosis to the skull base (V1-V3 segments). CTA HEAD FINDINGS POSTERIOR CIRCULATION: --Vertebral arteries: Normal V4 segments. --Inferior cerebellar arteries: Normal. --Basilar artery: Normal. --Superior cerebellar arteries: Normal. --Posterior cerebral arteries (PCA): Normal. ANTERIOR CIRCULATION: --Intracranial internal carotid arteries: Atherosclerotic calcification of the internal carotid arteries at the skull base without hemodynamically significant stenosis. --Anterior cerebral arteries (ACA): Normal. Both A1 segments are present. Patent anterior communicating artery (a-comm). --Middle cerebral arteries (MCA): Normal. VENOUS SINUSES: As permitted by contrast timing, patent. ANATOMIC VARIANTS: None Review of the MIP images confirms the above findings. IMPRESSION: 1. No emergent large vessel occlusion or hemodynamically significant stenosis of the head or neck. 2. Mild left carotid bifurcation atherosclerosis without hemodynamically significant stenosis. 3.  Aortic atherosclerosis (ICD10-I70.0). Electronically Signed   By: KUlyses JarredM.D.   On: 05/05/2022 19:58  MR BRAIN WO CONTRAST  Result Date: 05/05/2022 CLINICAL DATA:  Left facial droop EXAM: MRI HEAD WITHOUT CONTRAST TECHNIQUE: Multiplanar, multiecho pulse sequences of the brain and surrounding structures were obtained without intravenous contrast. COMPARISON:  Same day CT brain FINDINGS: Brain: Smallacute infarct in the right external capsule (series 3, image 20). There is a focus of punctate microhemorrhage adjacent to this. With an associated small volume blood products. Small nodular focus along the posterior margin of the left ventricular atrium (series 6, image 16/series 3, image 30) is nonspecific and may represent a site ovary matter heterotopia. No hydrocpehalus. No extra axial fluid collection.Sequela of severe chronic microvascular ischemic  change. Generalized volume loss. Partially empty sella Vascular: Normal flow voids. Skull and upper cervical spine: Normal marrow signal.There is likely at least moderate stenosis at the craniocervical junction secondary to a degenerative panus. Sinuses/Orbits: Bilateral lens replacement. Mucosal thickening right maxillary sinus. Other: None IMPRESSION: 1. Small focus of acute infarct in the right external capsule. There is a focus of microhemorrhage adjacent to this area of infarct, but not definitively associated with the infarct. 2. Small nodular focus along the posterior margin of the left ventricular atrium is nonspecific and may represent a site of gray matter heterotopia; recommend comparison with prior imaging. Electronically Signed   By: Marin Roberts M.D.   On: 05/05/2022 13:28   DG Chest Portable 1 View  Result Date: 05/05/2022 CLINICAL DATA:  Hypertension. EXAM: PORTABLE CHEST 1 VIEW COMPARISON:  Chest x-ray 02/01/2022. FINDINGS: Cardiomediastinal silhouette is mildly enlarged, probably similar to prior when accounting for differences in technique. No consolidation. No visible pleural effusions or pneumothorax. No acute osseous abnormality. IMPRESSION: No evidence of acute cardiopulmonary disease. Electronically Signed   By: Margaretha Sheffield M.D.   On: 05/05/2022 12:15   CT Head Wo Contrast  Result Date: 05/05/2022 CLINICAL DATA:  Facial droop EXAM: CT HEAD WITHOUT CONTRAST TECHNIQUE: Contiguous axial images were obtained from the base of the skull through the vertex without intravenous contrast. RADIATION DOSE REDUCTION: This exam was performed according to the departmental dose-optimization program which includes automated exposure control, adjustment of the mA and/or kV according to patient size and/or use of iterative reconstruction technique. COMPARISON:  CT head February 01, 2022 FINDINGS: Brain: Ventricles and sulci are prominent compatible with atrophy. Periventricular and subcortical white  matter hypodensities compatible with chronic microvascular ischemic changes. No evidence for acute cortically based infarct, intracranial hemorrhage, mass lesion or mass-effect. Vascular: No hyperdense vessel or unexpected calcification. Skull: Normal. Negative for fracture or focal lesion. Sinuses/Orbits: Paranasal sinuses are well aerated. Mucosal thickening involving the right maxillary sinus. Mastoid air cells are unremarkable. Other: None. IMPRESSION: 1. No acute intracranial process. 2. Atrophy and chronic microvascular ischemic changes. Electronically Signed   By: Lovey Newcomer M.D.   On: 05/05/2022 09:24    Microbiology: Results for orders placed or performed during the hospital encounter of 05/05/22  Urine Culture     Status: Abnormal   Collection Time: 05/05/22  9:25 AM   Specimen: Urine, Clean Catch  Result Value Ref Range Status   Specimen Description URINE, CLEAN CATCH  Final   Special Requests NONE  Final   Culture (A)  Final    >=100,000 COLONIES/mL AEROCOCCUS SPECIES Standardized susceptibility testing for this organism is not available. Performed at [facility], [place] [address], [place], [place] [address]    Report Status 05/07/2022 FINAL  Final    Labs: CBC: Recent Labs  Lab 05/05/22 0742 05/06/22  4401 05/07/22 0232 05/08/22 0305  WBC 3.0* 4.5 4.5 6.1  NEUTROABS 1.8  --  3.4 4.5  HGB 10.7* 11.1* 12.0 12.9  HCT 33.7* 32.5* 35.9* 38.2  MCV 89.9 84.6 84.7 84.5  PLT 218 217 236 027   Basic Metabolic Panel: Recent Labs  Lab 05/05/22 0742 05/07/22 0232 05/08/22 0305  NA 138 136 138  K 4.0 3.7 3.5  CL 102 101 99  CO2 '26 25 25  '$ GLUCOSE 86 128* 105*  BUN '17 11 12  '$ CREATININE 0.66 0.63 0.75  CALCIUM 9.8 9.7 10.2  MG  --  1.8  --    Liver Function Tests: Recent Labs  Lab 05/05/22 0742  AST 19  ALT 12  ALKPHOS 87  BILITOT 0.5  PROT 6.5  ALBUMIN 3.6   CBG: No results for input(s): "GLUCAP" in the last 168 hours.  Discharge time spent: less than  30 minutes.  Signed: Alma Friendly, MD [facility] 05/08/2022

## 2022-06-20 ENCOUNTER — Emergency Department (HOSPITAL_COMMUNITY): Payer: Medicare HMO

## 2022-06-20 ENCOUNTER — Other Ambulatory Visit: Payer: Self-pay

## 2022-06-20 ENCOUNTER — Emergency Department (HOSPITAL_COMMUNITY)
Admission: EM | Admit: 2022-06-20 | Discharge: 2022-06-20 | Disposition: A | Discharge status: Skilled Nursing Facility | Payer: Medicare HMO | Attending: Emergency Medicine | Admitting: Emergency Medicine

## 2022-06-20 ENCOUNTER — Encounter (HOSPITAL_COMMUNITY): Payer: Self-pay

## 2022-06-20 DIAGNOSIS — Z7982 Long term (current) use of aspirin: Secondary | ICD-10-CM | POA: Diagnosis not present

## 2022-06-20 DIAGNOSIS — R55 Syncope and collapse: Secondary | ICD-10-CM | POA: Diagnosis not present

## 2022-06-20 DIAGNOSIS — Z7902 Long term (current) use of antithrombotics/antiplatelets: Secondary | ICD-10-CM | POA: Diagnosis not present

## 2022-06-20 DIAGNOSIS — R531 Weakness: Secondary | ICD-10-CM | POA: Diagnosis not present

## 2022-06-20 DIAGNOSIS — R4781 Slurred speech: Secondary | ICD-10-CM | POA: Diagnosis not present

## 2022-06-20 DIAGNOSIS — R4182 Altered mental status, unspecified: Secondary | ICD-10-CM | POA: Diagnosis present

## 2022-06-20 LAB — CBC WITH DIFFERENTIAL/PLATELET
Abs Immature Granulocytes: 0.02 10*3/uL (ref 0.00–0.07)
Basophils Absolute: 0 10*3/uL (ref 0.0–0.1)
Basophils Relative: 0 %
Eosinophils Absolute: 0 10*3/uL (ref 0.0–0.5)
Eosinophils Relative: 1 %
HCT: 37.1 % (ref 36.0–46.0)
Hemoglobin: 11.6 g/dL — ABNORMAL LOW (ref 12.0–15.0)
Immature Granulocytes: 1 %
Lymphocytes Relative: 17 %
Lymphs Abs: 0.6 10*3/uL — ABNORMAL LOW (ref 0.7–4.0)
MCH: 28.2 pg (ref 26.0–34.0)
MCHC: 31.3 g/dL (ref 30.0–36.0)
MCV: 90.3 fL (ref 80.0–100.0)
Monocytes Absolute: 0.4 10*3/uL (ref 0.1–1.0)
Monocytes Relative: 10 %
Neutro Abs: 2.6 10*3/uL (ref 1.7–7.7)
Neutrophils Relative %: 71 %
Platelets: 285 10*3/uL (ref 150–400)
RBC: 4.11 MIL/uL (ref 3.87–5.11)
RDW: 14.2 % (ref 11.5–15.5)
WBC: 3.6 10*3/uL — ABNORMAL LOW (ref 4.0–10.5)
nRBC: 0 % (ref 0.0–0.2)

## 2022-06-20 LAB — COMPREHENSIVE METABOLIC PANEL
ALT: 13 U/L (ref 0–44)
AST: 24 U/L (ref 15–41)
Albumin: 3.9 g/dL (ref 3.5–5.0)
Alkaline Phosphatase: 79 U/L (ref 38–126)
Anion gap: 14 (ref 5–15)
BUN: 30 mg/dL — ABNORMAL HIGH (ref 8–23)
CO2: 22 mmol/L (ref 22–32)
Calcium: 9.7 mg/dL (ref 8.9–10.3)
Chloride: 104 mmol/L (ref 98–111)
Creatinine, Ser: 1.09 mg/dL — ABNORMAL HIGH (ref 0.44–1.00)
GFR, Estimated: 49 mL/min — ABNORMAL LOW (ref >60)
Glucose, Bld: 158 mg/dL — ABNORMAL HIGH (ref 70–99)
Potassium: 3.9 mmol/L (ref 3.5–5.1)
Sodium: 140 mmol/L (ref 135–145)
Total Bilirubin: 0.6 mg/dL (ref 0.3–1.2)
Total Protein: 6.7 g/dL (ref 6.5–8.1)

## 2022-06-20 LAB — TROPONIN I (HIGH SENSITIVITY): Troponin I (High Sensitivity): 10 ng/L (ref <18)

## 2022-06-20 MED ORDER — LORAZEPAM 2 MG/ML IJ SOLN
1.0000 mg | Freq: Once | INTRAMUSCULAR | Status: AC | PRN
  Administered 2022-06-20: 1 mg via INTRAVENOUS
  Filled 2022-06-20: qty 1

## 2022-06-20 NOTE — ED Provider Notes (Signed)
[facility] EMERGENCY DEPARTMENT Provider Note   CSN: [id number] Arrival date & time: 06/20/22  1428     History  Chief Complaint  Patient presents with   Altered Mental Status    Brenda Alexander is a 88 y.o. female history of previous stroke, here presenting with altered mental status.  Patient lives at the facility.  She was being fed by her daughter and the daughter witnessed that she became acutely unresponsive.  Her eyes rolled back and she apparently had some slurred speech afterwards.  Patient is back to baseline upon arrival to the ED.  Patient states that she has residual right-sided weakness after previous stroke.  The history is provided by the patient.       Home Medications Prior to Admission medications   Medication Sig Start Date End Date Taking? Authorizing Provider  acetaminophen (TYLENOL) 500 MG tablet Take 1,000 mg by mouth every 6 (six) hours as needed (for pain).     [provider]  aspirin 81 MG chewable tablet Chew 1 tablet (81 mg total) by mouth daily. 05/09/22   Alma Friendly, MD  busPIRone (BUSPAR) 10 MG tablet Take 10 mg by mouth 3 (three) times daily.    [provider]  Calcium Carb-Cholecalciferol (CALCIUM + D3 PO) Take 1 tablet by mouth daily.    [provider]  citalopram (CELEXA) 10 MG tablet Take 10 mg by mouth daily.    [provider]  cloNIDine (CATAPRES) 0.2 MG tablet Take 0.2 mg by mouth 2 (two) times daily. 08/06/21   [provider]  clopidogrel (PLAVIX) 75 MG tablet Take 1 tablet (75 mg total) by mouth daily. 05/09/22   Alma Friendly, MD  docusate sodium (COLACE) 100 MG capsule Take 1 capsule (100 mg total) by mouth 2 (two) times daily as needed for mild constipation or moderate constipation. 05/06/18   Anyanwu, Sallyanne Havers, MD  ferrous sulfate 325 (65 FE) MG EC tablet Take 1 tablet (325 mg total) by mouth 2 (two) times daily. 01/13/18 07/28/48  Elgergawy, Silver Huguenin, MD   isosorbide mononitrate (ISMO,MONOKET) 20 MG tablet Take 20 mg by mouth daily. 12/23/17   [provider]  labetalol (NORMODYNE) 100 MG tablet Take 0.5 tablets (50 mg total) by mouth 2 (two) times daily. 11/01/21   Antonieta Pert, MD  levETIRAcetam (KEPPRA) 250 MG tablet Take 250 mg by mouth 2 (two) times daily.    [provider]  LORazepam (ATIVAN) 0.5 MG tablet Take 1 tablet (0.5 mg total) by mouth daily as needed for anxiety. 05/08/22   Alma Friendly, MD  Multiple Vitamin (MULTIVITAMIN) tablet Take 1 tablet by mouth daily.    [provider]  Nutritional Supplements (ENSURE ORIGINAL) LIQD Take 1 Bottle by mouth at bedtime.    [provider]  pantoprazole (PROTONIX) 40 MG tablet Take 1 tablet (40 mg total) by mouth daily. 05/09/22   Alma Friendly, MD  simvastatin (ZOCOR) 20 MG tablet Take 2 tablets (40 mg total) by mouth at bedtime. 05/08/22   Alma Friendly, MD  spironolactone (ALDACTONE) 25 MG tablet Take 0.5 tablets (12.5 mg total) by mouth daily. 10/16/21   Patwardhan, Reynold Bowen, MD  vitamin C (ASCORBIC ACID) 500 MG tablet Take 500 mg by mouth daily.    [provider]  furosemide (LASIX) 40 MG tablet Take 40 mg by mouth as needed.  08/06/20  [provider]      Allergies    Other  and Penicillins    Review of Systems   Review of Systems  All other systems reviewed and are negative.   Physical Exam Updated Vital Signs BP (!) 156/80   Pulse 81   Temp 98.1 F (36.7 C) (Oral)   Resp (!) 27   LMP  (LMP Unknown)   SpO2 100%  Physical Exam Vitals and nursing note reviewed.  Constitutional:      Comments: Chronically ill  HENT:     Head: Normocephalic.     Nose: Nose normal.     Mouth/Throat:     Mouth: Mucous membranes are moist.  Eyes:     Extraocular Movements: Extraocular movements intact.     Pupils: Pupils are equal, round, and reactive to light.  Cardiovascular:     Rate and Rhythm: Normal rate and  regular rhythm.     Pulses: Normal pulses.     Heart sounds: Normal heart sounds.  Pulmonary:     Effort: Pulmonary effort is normal.     Breath sounds: Normal breath sounds.  Abdominal:     General: Abdomen is flat.     Palpations: Abdomen is soft.  Musculoskeletal:        General: Normal range of motion.     Cervical back: Normal range of motion and neck supple.  Skin:    General: Skin is warm.     Capillary Refill: Capillary refill takes less than 2 seconds.  Neurological:     Mental Status: She is alert.     Comments: Strength is 4 out of 5 right arm and right leg which is chronic.  Patient has no obvious facial droop.  Patient has no eye deviation.  Psychiatric:        Mood and Affect: Mood normal.        Behavior: Behavior normal.     ED Results / Procedures / Treatments   Labs (all labs ordered are listed, but only abnormal results are displayed) Labs Reviewed  CBC WITH DIFFERENTIAL/PLATELET - Abnormal; Notable for the following components:      Result Value   WBC 3.6 (*)    Hemoglobin 11.6 (*)    Lymphs Abs 0.6 (*)    All other components within normal limits  COMPREHENSIVE METABOLIC PANEL - Abnormal; Notable for the following components:   Glucose, Bld 158 (*)    BUN 30 (*)    Creatinine, Ser 1.09 (*)    GFR, Estimated 49 (*)    All other components within normal limits  URINE CULTURE  URINALYSIS, ROUTINE W REFLEX MICROSCOPIC  TROPONIN I (HIGH SENSITIVITY)  TROPONIN I (HIGH SENSITIVITY)    EKG EKG Interpretation  Date/Time:  Friday June 20 2022 14:51:06 EST Ventricular Rate:  77 PR Interval:  174 QRS Duration: 85 QT Interval:  431 QTC Calculation: 488 R Axis:   4 Text Interpretation: Sinus rhythm Borderline prolonged QT interval No significant change since last tracing Confirmed by Wandra Arthurs [id number] on 06/20/2022 4:35:50 PM  Radiology MR BRAIN WO CONTRAST  Result Date: 06/20/2022 CLINICAL DATA:  Difficulty swallowing followed by  unresponsiveness and confusion. EXAM: MRI HEAD WITHOUT CONTRAST TECHNIQUE: Multiplanar, multiecho pulse sequences of the brain and surrounding structures were obtained without intravenous contrast. COMPARISON:  Same-day CT head, brain MRI 05/05/2022 FINDINGS: Brain: There is no acute intracranial hemorrhage, extra-axial fluid collection, or acute infarct. The previously seen focus of diffusion restriction in the right external capsule on the study from 05/05/2022 is no longer seen. There is unchanged  background parenchymal volume loss with prominence of the ventricular system and extra-axial CSF spaces. Confluent FLAIR signal abnormality in the supratentorial white matter likely reflecting sequela of advanced chronic small-vessel ischemic change is stable. A punctate chronic microhemorrhage in the right external capsule/globus pallidus is unchanged. An empty sella is again noted. There is no mass lesion. There is no mass effect or midline shift. Vascular: Normal flow voids. Skull and upper cervical spine: There is no suspicious marrow signal abnormality. There is multilevel degenerative change of the cervical spine resulting in at least moderate to severe spinal canal stenosis at C2-C3 and C3-C4 with possible cord compression. Sinuses/Orbits: There is mucosal thickening in the paranasal sinuses with layering fluid in the right maxillary sinus. Bilateral lens implants are in place. The globes and orbits are otherwise unremarkable. Other: None. IMPRESSION: 1. No acute intracranial pathology. 2. Layering fluid in the right maxillary sinus which can be seen with acute sinusitis in the correct clinical setting. 3. Multilevel degenerative change of the upper cervical spine with moderate to severe stenosis and possible cord compression. This could be further evaluated with dedicated cervical spine MRI as indicated. Electronically Signed   By: Valetta Mole M.D.   On: 06/20/2022 18:34   CT HEAD WO CONTRAST (5MM)  Result  Date: 06/20/2022 CLINICAL DATA:  Altered mental status. EXAM: CT HEAD WITHOUT CONTRAST TECHNIQUE: Contiguous axial images were obtained from the base of the skull through the vertex without intravenous contrast. RADIATION DOSE REDUCTION: This exam was performed according to the departmental dose-optimization program which includes automated exposure control, adjustment of the mA and/or kV according to patient size and/or use of iterative reconstruction technique. COMPARISON:  CT head dated 05/08/2022. FINDINGS: Brain: No evidence of acute infarction, hemorrhage, hydrocephalus, extra-axial collection or mass lesion/mass effect. There is moderate cerebral volume loss with associated ex vacuo dilatation. Periventricular white matter hypoattenuation likely represents chronic small vessel ischemic disease. Vascular: There are vascular calcifications in the carotid siphons. Skull: Normal. Negative for fracture or focal lesion. Sinuses/Orbits: There is sinus disease involving the right maxillary sinus, bilateral ethmoid sinuses, and left sphenoid sinus. Other: Degenerative changes are seen in the cervical spine. IMPRESSION: 1. No acute intracranial process. Electronically Signed   By: Zerita Boers M.D.   On: 06/20/2022 16:00   DG Chest Port 1 View  Result Date: 06/20/2022 CLINICAL DATA:  Altered mental status. EXAM: PORTABLE CHEST 1 VIEW COMPARISON:  05/05/2022 FINDINGS: The lungs are clear without focal pneumonia, edema, pneumothorax or pleural effusion. Cardiopericardial silhouette is at upper limits of normal for size. The visualized bony structures of the thorax are unremarkable. Telemetry leads overlie the chest. IMPRESSION: No active disease. Electronically Signed   By: Misty Stanley M.D.   On: 06/20/2022 15:23    Procedures Procedures    Medications Ordered in ED Medications  LORazepam (ATIVAN) injection 1 mg (has no administration in time range)    ED Course/ Medical Decision Making/ A&P                            Medical Decision Making Kely Dohn is a 88 y.o. female here presenting with altered mental status.  Consider near syncope versus seizure versus another stroke.  Plan to get CBC and CMP and MRI brain.   8:57 PM I reviewed patient's labs and independently interpreted MRI.  MRI showed no acute stroke.  She has cervical stenosis and possible cord compression but patient has no focal deficits  to suggest cord compression.  I discussed case with Dr. Regis Bill from neurology.  He states that patient can get outpatient EEG.  I discussed with daughter at bedside regarding admitting for possible syncope workup versus discharging back to the facility.  She states that mother gets agitated a lot at the unfamiliar environment.  Her troponin is negative her EKG is unremarkable.  She would prefer that patient goes back to the facility.  Patient is DNR.  I think this is a reasonable option.  Gave strict return precautions.  Problems Addressed: Altered mental status, unspecified altered mental status type: acute illness or injury Near syncope: acute illness or injury  Amount and/or Complexity of Data Reviewed Labs: ordered. Decision-making details documented in ED Course. Radiology: ordered and independent interpretation performed. Decision-making details documented in ED Course. ECG/medicine tests: ordered and independent interpretation performed. Decision-making details documented in ED Course.  Risk Prescription drug management.    Final Clinical Impression(s) / ED Diagnoses Final diagnoses:  None    Rx / DC Orders ED Discharge Orders     None         Drenda Freeze, MD 06/20/22 2100

## 2022-06-20 NOTE — ED Triage Notes (Signed)
Patient was being fed dinner by her daughter, started having some trouble swallowing and then went unresponsive and had some confusion.  Patient has an old tremor to her right arm and hand, and some old facial droop from a previous stroke. Patient is now alert and able to follow commands and answer questions appropriately.

## 2022-06-20 NOTE — Discharge Instructions (Addendum)
I recommend that you follow-up with neurology to get an outpatient EEG.  Please continue taking your medicines as prescribed.  Return to ER if you have episode of passing out, altered mental status, lethargy

## 2022-07-13 ENCOUNTER — Other Ambulatory Visit: Payer: Self-pay

## 2022-07-13 ENCOUNTER — Emergency Department (HOSPITAL_COMMUNITY): Payer: Medicare HMO

## 2022-07-13 ENCOUNTER — Emergency Department (HOSPITAL_COMMUNITY)
Admission: EM | Admit: 2022-07-13 | Discharge: 2022-07-13 | Disposition: A | Discharge status: Home / Self Care | Payer: Medicare HMO | Attending: Emergency Medicine | Admitting: Emergency Medicine

## 2022-07-13 DIAGNOSIS — Z7902 Long term (current) use of antithrombotics/antiplatelets: Secondary | ICD-10-CM | POA: Insufficient documentation

## 2022-07-13 DIAGNOSIS — J101 Influenza due to other identified influenza virus with other respiratory manifestations: Secondary | ICD-10-CM

## 2022-07-13 DIAGNOSIS — Z7982 Long term (current) use of aspirin: Secondary | ICD-10-CM | POA: Diagnosis not present

## 2022-07-13 DIAGNOSIS — Z20822 Contact with and (suspected) exposure to covid-19: Secondary | ICD-10-CM | POA: Insufficient documentation

## 2022-07-13 DIAGNOSIS — R0602 Shortness of breath: Secondary | ICD-10-CM | POA: Diagnosis present

## 2022-07-13 LAB — CBC WITH DIFFERENTIAL/PLATELET
Abs Immature Granulocytes: 0.01 10*3/uL (ref 0.00–0.07)
Basophils Absolute: 0 10*3/uL (ref 0.0–0.1)
Basophils Relative: 0 %
Eosinophils Absolute: 0.1 10*3/uL (ref 0.0–0.5)
Eosinophils Relative: 2 %
HCT: 30.8 % — ABNORMAL LOW (ref 36.0–46.0)
Hemoglobin: 10.1 g/dL — ABNORMAL LOW (ref 12.0–15.0)
Immature Granulocytes: 0 %
Lymphocytes Relative: 23 %
Lymphs Abs: 0.6 10*3/uL — ABNORMAL LOW (ref 0.7–4.0)
MCH: 28.5 pg (ref 26.0–34.0)
MCHC: 32.8 g/dL (ref 30.0–36.0)
MCV: 87 fL (ref 80.0–100.0)
Monocytes Absolute: 0.5 10*3/uL (ref 0.1–1.0)
Monocytes Relative: 19 %
Neutro Abs: 1.5 10*3/uL — ABNORMAL LOW (ref 1.7–7.7)
Neutrophils Relative %: 56 %
Platelets: 187 10*3/uL (ref 150–400)
RBC: 3.54 MIL/uL — ABNORMAL LOW (ref 3.87–5.11)
RDW: 14.9 % (ref 11.5–15.5)
WBC: 2.7 10*3/uL — ABNORMAL LOW (ref 4.0–10.5)
nRBC: 0 % (ref 0.0–0.2)

## 2022-07-13 LAB — COMPREHENSIVE METABOLIC PANEL
ALT: 13 U/L (ref 0–44)
AST: 24 U/L (ref 15–41)
Albumin: 3.4 g/dL — ABNORMAL LOW (ref 3.5–5.0)
Alkaline Phosphatase: 78 U/L (ref 38–126)
Anion gap: 11 (ref 5–15)
BUN: 15 mg/dL (ref 8–23)
CO2: 24 mmol/L (ref 22–32)
Calcium: 9 mg/dL (ref 8.9–10.3)
Chloride: 101 mmol/L (ref 98–111)
Creatinine, Ser: 0.63 mg/dL (ref 0.44–1.00)
GFR, Estimated: >60 mL/min (ref >60)
Glucose, Bld: 90 mg/dL (ref 70–99)
Potassium: 3.9 mmol/L (ref 3.5–5.1)
Sodium: 136 mmol/L (ref 135–145)
Total Bilirubin: 0.3 mg/dL (ref 0.3–1.2)
Total Protein: 6.1 g/dL — ABNORMAL LOW (ref 6.5–8.1)

## 2022-07-13 LAB — PROTIME-INR
INR: 1.1 (ref 0.8–1.2)
Prothrombin Time: 13.9 seconds (ref 11.4–15.2)

## 2022-07-13 LAB — RESP PANEL BY RT-PCR (RSV, FLU A&B, COVID)  RVPGX2
Influenza A by PCR: POSITIVE — AB
Influenza B by PCR: NEGATIVE
Resp Syncytial Virus by PCR: NEGATIVE
SARS Coronavirus 2 by RT PCR: NEGATIVE

## 2022-07-13 LAB — APTT: aPTT: 34 seconds (ref 24–36)

## 2022-07-13 LAB — LACTIC ACID, PLASMA: Lactic Acid, Venous: 0.8 mmol/L (ref 0.5–1.9)

## 2022-07-13 MED ORDER — LACTATED RINGERS IV BOLUS (SEPSIS)
1000.0000 mL | Freq: Once | INTRAVENOUS | Status: AC
  Administered 2022-07-13: 1000 mL via INTRAVENOUS

## 2022-07-13 MED ORDER — ONDANSETRON 4 MG PO TBDP: ORAL_TABLET | ORAL | 0 refills | Status: DC

## 2022-07-13 MED ORDER — OSELTAMIVIR PHOSPHATE 75 MG PO CAPS: 75.0000 mg | ORAL_CAPSULE | Freq: Two times a day (BID) | ORAL | 0 refills | Status: DC

## 2022-07-13 MED ORDER — ACETAMINOPHEN 500 MG PO TABS
1000.0000 mg | ORAL_TABLET | Freq: Once | ORAL | Status: AC
  Administered 2022-07-13: 1000 mg via ORAL
  Filled 2022-07-13: qty 2

## 2022-07-13 NOTE — ED Notes (Signed)
Pt transported back to facility via [facility]. DNR paperwork sent with [facility].

## 2022-07-13 NOTE — Discharge Instructions (Signed)
Follow up with your doctor.  I prescribed you a medicine for the flu.  This medicine has a very high likelihood of making you throw up and have diarrhea.  I prescribed some nausea medicine in case that part happens to you.  Is fine to take Imodium for diarrhea which she can buy over-the-counter.  Please return for worsening difficulty breathing or confusion.

## 2022-07-13 NOTE — ED Provider Notes (Signed)
[place] EMERGENCY DEPARTMENT Provider Note   CSN: [id number] Arrival date & time: 07/13/22  0246     History  Chief Complaint  Patient presents with   Shortness of Breath   Fever    Brenda Alexander is a 88 y.o. female.  88 yo F with a chief complaints of fever and difficulty breathing.  This is gone on since yesterday.  She has been coughing quite a bit.  No known sick contacts no abdominal pain nausea vomiting or diarrhea.  She tells me she is a bit constipated.  Denies urinary symptoms denies sores or rashes.   Shortness of Breath Associated symptoms: fever   Fever      Home Medications Prior to Admission medications   Medication Sig Start Date End Date Taking? Authorizing Provider  ondansetron (ZOFRAN-ODT) 4 MG disintegrating tablet '4mg'$  ODT q4 hours prn nausea/vomit 07/13/22  Yes Deno Etienne, DO  oseltamivir (TAMIFLU) 75 MG capsule Take 1 capsule (75 mg total) by mouth every 12 (twelve) hours. 07/13/22  Yes Deno Etienne, DO  acetaminophen (TYLENOL) 500 MG tablet Take 1,000 mg by mouth every 6 (six) hours as needed (for pain).     [provider]  aspirin 81 MG chewable tablet Chew 1 tablet (81 mg total) by mouth daily. 05/09/22   Alma Friendly, MD  busPIRone (BUSPAR) 10 MG tablet Take 10 mg by mouth 3 (three) times daily.    [provider]  Calcium Carb-Cholecalciferol (CALCIUM + D3 PO) Take 1 tablet by mouth daily.    [provider]  citalopram (CELEXA) 10 MG tablet Take 10 mg by mouth daily.    [provider]  cloNIDine (CATAPRES) 0.2 MG tablet Take 0.2 mg by mouth 2 (two) times daily. 08/06/21   [provider]  clopidogrel (PLAVIX) 75 MG tablet Take 1 tablet (75 mg total) by mouth daily. 05/09/22   Alma Friendly, MD  docusate sodium (COLACE) 100 MG capsule Take 1 capsule (100 mg total) by mouth 2 (two) times daily as needed for mild constipation or moderate constipation. 05/06/18   Anyanwu,  Sallyanne Havers, MD  ferrous sulfate 325 (65 FE) MG EC tablet Take 1 tablet (325 mg total) by mouth 2 (two) times daily. 01/13/18 07/28/48  Elgergawy, Silver Huguenin, MD  isosorbide mononitrate (ISMO,MONOKET) 20 MG tablet Take 20 mg by mouth daily. 12/23/17   [provider]  labetalol (NORMODYNE) 100 MG tablet Take 0.5 tablets (50 mg total) by mouth 2 (two) times daily. 11/01/21   Antonieta Pert, MD  levETIRAcetam (KEPPRA) 250 MG tablet Take 250 mg by mouth 2 (two) times daily.    [provider]  LORazepam (ATIVAN) 0.5 MG tablet Take 1 tablet (0.5 mg total) by mouth daily as needed for anxiety. 05/08/22   Alma Friendly, MD  Multiple Vitamin (MULTIVITAMIN) tablet Take 1 tablet by mouth daily.    [provider]  Nutritional Supplements (ENSURE ORIGINAL) LIQD Take 1 Bottle by mouth at bedtime.    [provider]  pantoprazole (PROTONIX) 40 MG tablet Take 1 tablet (40 mg total) by mouth daily. 05/09/22   Alma Friendly, MD  simvastatin (ZOCOR) 20 MG tablet Take 2 tablets (40 mg total) by mouth at bedtime. 05/08/22   Alma Friendly, MD  spironolactone (ALDACTONE) 25 MG tablet Take 0.5 tablets (12.5 mg total) by mouth daily. 10/16/21   Patwardhan, Reynold Bowen, MD  vitamin C (ASCORBIC ACID) 500 MG tablet Take 500 mg by mouth  daily.    [provider]  furosemide (LASIX) 40 MG tablet Take 40 mg by mouth as needed.  08/06/20  [provider]      Allergies    Other and Penicillins    Review of Systems   Review of Systems  Constitutional:  Positive for fever.  Respiratory:  Positive for shortness of breath.     Physical Exam Updated Vital Signs BP (!) 173/77   Pulse (!) 58   Temp (!) 101.2 F (38.4 C) (Axillary)   Resp 19   Wt 68.2 kg   LMP  (LMP Unknown)   SpO2 98%   BMI 27.50 kg/m  Physical Exam Vitals and nursing note reviewed.  Constitutional:      General: She is not in acute distress.    Appearance: She is well-developed. She is not  diaphoretic.  HENT:     Head: Normocephalic and atraumatic.  Eyes:     Pupils: Pupils are equal, round, and reactive to light.  Cardiovascular:     Rate and Rhythm: Normal rate and regular rhythm.     Heart sounds: No murmur heard.    No friction rub. No gallop.  Pulmonary:     Effort: Pulmonary effort is normal.     Breath sounds: No wheezing or rales.  Abdominal:     General: There is no distension.     Palpations: Abdomen is soft.     Tenderness: There is no abdominal tenderness.  Musculoskeletal:        General: No tenderness.     Cervical back: Normal range of motion and neck supple.  Skin:    General: Skin is warm and dry.  Neurological:     Mental Status: She is alert and oriented to person, place, and time.  Psychiatric:        Behavior: Behavior normal.     ED Results / Procedures / Treatments   Labs (all labs ordered are listed, but only abnormal results are displayed) Labs Reviewed  RESP PANEL BY RT-PCR (RSV, FLU A&B, COVID)  RVPGX2 - Abnormal; Notable for the following components:      Result Value   Influenza A by PCR POSITIVE (*)    All other components within normal limits  COMPREHENSIVE METABOLIC PANEL - Abnormal; Notable for the following components:   Total Protein 6.1 (*)    Albumin 3.4 (*)    All other components within normal limits  CBC WITH DIFFERENTIAL/PLATELET - Abnormal; Notable for the following components:   WBC 2.7 (*)    RBC 3.54 (*)    Hemoglobin 10.1 (*)    HCT 30.8 (*)    Neutro Abs 1.5 (*)    Lymphs Abs 0.6 (*)    All other components within normal limits  CULTURE, BLOOD (ROUTINE X 2)  CULTURE, BLOOD (ROUTINE X 2)  URINE CULTURE  LACTIC ACID, PLASMA  PROTIME-INR  APTT  LACTIC ACID, PLASMA  URINALYSIS, ROUTINE W REFLEX MICROSCOPIC    EKG EKG Interpretation  Date/Time:  Sunday July 13 2022 02:48:13 EST Ventricular Rate:  73 PR Interval:  175 QRS Duration: 87 QT Interval:  414 QTC Calculation: 457 R  Axis:   -12 Text Interpretation: Sinus rhythm No significant change since last tracing Confirmed by Deno Etienne [phone]) on 07/13/2022 2:48:55 AM  Radiology DG Chest Port 1 View  Result Date: 07/13/2022 CLINICAL DATA:  Sepsis EXAM: PORTABLE CHEST 1 VIEW COMPARISON:  06/20/2022 FINDINGS: Lungs are clear.  No pleural effusion or pneumothorax.  The heart is top-normal in size. IMPRESSION: No evidence of acute cardiopulmonary disease. Electronically Signed   By: Julian Hy M.D.   On: 07/13/2022 03:33    Procedures Procedures    Medications Ordered in ED Medications  lactated ringers bolus 1,000 mL (0 mLs Intravenous Stopped 07/13/22 0448)  acetaminophen (TYLENOL) tablet 1,000 mg (1,000 mg Oral Given 07/13/22 2947)    ED Course/ Medical Decision Making/ A&P                           Medical Decision Making Amount and/or Complexity of Data Reviewed Labs: ordered. Radiology: ordered. ECG/medicine tests: ordered.  Risk OTC drugs. Prescription drug management.   88 yo F with a chief complaints of a fever cough and shortness of breath.  Going on for about 24 hours.  Will obtain a laboratory evaluation chest x-ray reassess.  Chest x-ray independently interpreted by me without focal infiltrate.  Lactate normal.  No significant electrolyte abnormality.  Mild leukopenia.  Influenza A positive.  Likely the cause of the patient's symptoms.  I discussed risks and benefits of Tamiflu and she is electing to try it.  Not hypoxic.  No significant tachypnea.  Will discharge back to her skilled nursing facility.  5:22 AM:  I have discussed the diagnosis/risks/treatment options with the patient.  Evaluation and diagnostic testing in the emergency department does not suggest an emergent condition requiring admission or immediate intervention beyond what has been performed at this time.  They will follow up with PCP. We also discussed returning to the ED immediately if new or worsening sx occur. We  discussed the sx which are most concerning (e.g., sudden worsening pain, fever, inability to tolerate by mouth) that necessitate immediate return. Medications administered to the patient during their visit and any new prescriptions provided to the patient are listed below.  Medications given during this visit Medications  lactated ringers bolus 1,000 mL (0 mLs Intravenous Stopped 07/13/22 0448)  acetaminophen (TYLENOL) tablet 1,000 mg (1,000 mg Oral Given 07/13/22 6546)     The patient appears reasonably screen and/or stabilized for discharge and I doubt any other medical condition or other [facility] requiring further screening, evaluation, or treatment in the ED at this time prior to discharge.          Final Clinical Impression(s) / ED Diagnoses Final diagnoses:  Influenza A    Rx / DC Orders ED Discharge Orders          Ordered    oseltamivir (TAMIFLU) 75 MG capsule  Every 12 hours        07/13/22 0519    ondansetron (ZOFRAN-ODT) 4 MG disintegrating tablet        07/13/22 0519              Deno Etienne, DO 07/13/22 0522

## 2022-07-13 NOTE — ED Triage Notes (Signed)
Pt in from [facility] via [facility] with sob, fever. Temp 101.2 on arrival. Per pt, she developed cough yesterday, and became sob throughout the day. Expiratory wheezes present, given 1 duoneb en route. Sats 98% RA  VS en route: 95% RA 141/94 67 24

## 2022-07-18 LAB — CULTURE, BLOOD (ROUTINE X 2)
Culture: NO GROWTH
Culture: NO GROWTH
Special Requests: ADEQUATE

## 2022-09-23 ENCOUNTER — Other Ambulatory Visit: Payer: Self-pay

## 2022-09-23 ENCOUNTER — Emergency Department (HOSPITAL_COMMUNITY)
Admission: EM | Admit: 2022-09-23 | Discharge: 2022-09-23 | Disposition: A | Discharge status: Home / Self Care | Payer: Medicare HMO | Attending: Emergency Medicine | Admitting: Emergency Medicine

## 2022-09-23 ENCOUNTER — Emergency Department (HOSPITAL_COMMUNITY): Payer: Medicare HMO

## 2022-09-23 DIAGNOSIS — K5900 Constipation, unspecified: Secondary | ICD-10-CM | POA: Diagnosis not present

## 2022-09-23 DIAGNOSIS — D649 Anemia, unspecified: Secondary | ICD-10-CM | POA: Diagnosis not present

## 2022-09-23 DIAGNOSIS — Z8542 Personal history of malignant neoplasm of other parts of uterus: Secondary | ICD-10-CM | POA: Insufficient documentation

## 2022-09-23 DIAGNOSIS — Z7982 Long term (current) use of aspirin: Secondary | ICD-10-CM | POA: Insufficient documentation

## 2022-09-23 DIAGNOSIS — N390 Urinary tract infection, site not specified: Secondary | ICD-10-CM

## 2022-09-23 DIAGNOSIS — I2510 Atherosclerotic heart disease of native coronary artery without angina pectoris: Secondary | ICD-10-CM | POA: Insufficient documentation

## 2022-09-23 DIAGNOSIS — F0390 Unspecified dementia without behavioral disturbance: Secondary | ICD-10-CM | POA: Insufficient documentation

## 2022-09-23 DIAGNOSIS — Z7902 Long term (current) use of antithrombotics/antiplatelets: Secondary | ICD-10-CM | POA: Insufficient documentation

## 2022-09-23 DIAGNOSIS — R319 Hematuria, unspecified: Secondary | ICD-10-CM | POA: Diagnosis present

## 2022-09-23 LAB — CBC WITH DIFFERENTIAL/PLATELET
Abs Immature Granulocytes: 0.01 10*3/uL (ref 0.00–0.07)
Basophils Absolute: 0 10*3/uL (ref 0.0–0.1)
Basophils Relative: 0 %
Eosinophils Absolute: 0.1 10*3/uL (ref 0.0–0.5)
Eosinophils Relative: 3 %
HCT: 32 % — ABNORMAL LOW (ref 36.0–46.0)
Hemoglobin: 10.2 g/dL — ABNORMAL LOW (ref 12.0–15.0)
Immature Granulocytes: 0 %
Lymphocytes Relative: 21 %
Lymphs Abs: 0.8 10*3/uL (ref 0.7–4.0)
MCH: 28.5 pg (ref 26.0–34.0)
MCHC: 31.9 g/dL (ref 30.0–36.0)
MCV: 89.4 fL (ref 80.0–100.0)
Monocytes Absolute: 0.5 10*3/uL (ref 0.1–1.0)
Monocytes Relative: 15 %
Neutro Abs: 2.1 10*3/uL (ref 1.7–7.7)
Neutrophils Relative %: 61 %
Platelets: 234 10*3/uL (ref 150–400)
RBC: 3.58 MIL/uL — ABNORMAL LOW (ref 3.87–5.11)
RDW: 14.2 % (ref 11.5–15.5)
WBC: 3.5 10*3/uL — ABNORMAL LOW (ref 4.0–10.5)
nRBC: 0 % (ref 0.0–0.2)

## 2022-09-23 LAB — PROTIME-INR
INR: 1 (ref 0.8–1.2)
Prothrombin Time: 13.1 seconds (ref 11.4–15.2)

## 2022-09-23 LAB — BASIC METABOLIC PANEL
Anion gap: 11 (ref 5–15)
BUN: 26 mg/dL — ABNORMAL HIGH (ref 8–23)
CO2: 24 mmol/L (ref 22–32)
Calcium: 9.1 mg/dL (ref 8.9–10.3)
Chloride: 102 mmol/L (ref 98–111)
Creatinine, Ser: 0.74 mg/dL (ref 0.44–1.00)
GFR, Estimated: >60 mL/min (ref >60)
Glucose, Bld: 97 mg/dL (ref 70–99)
Potassium: 4.4 mmol/L (ref 3.5–5.1)
Sodium: 137 mmol/L (ref 135–145)

## 2022-09-23 LAB — URINALYSIS, ROUTINE W REFLEX MICROSCOPIC
Bilirubin Urine: NEGATIVE
Glucose, UA: NEGATIVE mg/dL
Ketones, ur: NEGATIVE mg/dL
Nitrite: NEGATIVE
Protein, ur: >300 mg/dL — AB
Specific Gravity, Urine: 1.01 (ref 1.005–1.030)
pH: 8.5 — ABNORMAL HIGH (ref 5.0–8.0)

## 2022-09-23 LAB — URINALYSIS, MICROSCOPIC (REFLEX)
RBC / HPF: >50 RBC/hpf (ref 0–5)
WBC, UA: >50 WBC/hpf (ref 0–5)

## 2022-09-23 LAB — CK: Total CK: 113 U/L (ref 38–234)

## 2022-09-23 MED ORDER — POLYETHYLENE GLYCOL 3350 17 G PO PACK
17.0000 g | PACK | Freq: Every day | ORAL | Status: DC
  Administered 2022-09-23: 17 g via ORAL
  Filled 2022-09-23: qty 1

## 2022-09-23 MED ORDER — CEFDINIR 300 MG PO CAPS
300.0000 mg | ORAL_CAPSULE | Freq: Two times a day (BID) | ORAL | 0 refills | Status: DC
  Filled 2022-09-23: qty 13, 7d supply, fill #0

## 2022-09-23 MED ORDER — FLEET ENEMA 7-19 GM/118ML RE ENEM
1.0000 | ENEMA | Freq: Once | RECTAL | 0 refills | Status: DC
  Filled 2022-09-23: qty 133, 1d supply, fill #0

## 2022-09-23 MED ORDER — SENNOSIDES-DOCUSATE SODIUM 8.6-50 MG PO TABS
1.0000 | ORAL_TABLET | Freq: Every day | ORAL | 0 refills | Status: DC
  Filled 2022-09-23: qty 30, 30d supply, fill #0

## 2022-09-23 MED ORDER — LACTULOSE 10 GM/15ML PO SOLN
10.0000 g | Freq: Once | ORAL | Status: AC
  Administered 2022-09-23: 10 g via ORAL
  Filled 2022-09-23: qty 15

## 2022-09-23 MED ORDER — BUSPIRONE HCL 10 MG PO TABS
10.0000 mg | ORAL_TABLET | Freq: Once | ORAL | Status: AC
  Administered 2022-09-23: 10 mg via ORAL
  Filled 2022-09-23: qty 1

## 2022-09-23 MED ORDER — SENNOSIDES-DOCUSATE SODIUM 8.6-50 MG PO TABS: 1.0000 | ORAL_TABLET | Freq: Every day | ORAL | 0 refills | Status: DC

## 2022-09-23 MED ORDER — DOCUSATE SODIUM 100 MG PO CAPS
200.0000 mg | ORAL_CAPSULE | Freq: Once | ORAL | Status: AC
  Administered 2022-09-23: 200 mg via ORAL
  Filled 2022-09-23: qty 2

## 2022-09-23 MED ORDER — SENNA 8.6 MG PO TABS
1.0000 | ORAL_TABLET | Freq: Once | ORAL | Status: AC
  Administered 2022-09-23: 8.6 mg via ORAL
  Filled 2022-09-23: qty 1

## 2022-09-23 MED ORDER — POLYETHYLENE GLYCOL 3350 17 G PO PACK
17.0000 g | PACK | Freq: Every day | ORAL | 0 refills | Status: DC
  Filled 2022-09-23: qty 30, 30d supply, fill #0

## 2022-09-23 MED ORDER — SODIUM CHLORIDE 0.9 % IV SOLN
1.0000 g | Freq: Once | INTRAVENOUS | Status: AC
  Administered 2022-09-23: 1 g via INTRAVENOUS
  Filled 2022-09-23: qty 10

## 2022-09-23 MED ORDER — POLYETHYLENE GLYCOL 3350 17 G PO PACK: 17.0000 g | PACK | Freq: Every day | ORAL | 0 refills | Status: DC

## 2022-09-23 MED ORDER — RISPERIDONE 1 MG PO TABS
1.0000 mg | ORAL_TABLET | Freq: Once | ORAL | Status: AC
  Administered 2022-09-23: 1 mg via ORAL
  Filled 2022-09-23 (×2): qty 1

## 2022-09-23 MED ORDER — CEFDINIR 300 MG PO CAPS: 300.0000 mg | ORAL_CAPSULE | Freq: Two times a day (BID) | ORAL | 0 refills | Status: AC

## 2022-09-23 MED ORDER — FLEET ENEMA 7-19 GM/118ML RE ENEM: 1.0000 | ENEMA | Freq: Once | RECTAL | 0 refills | Status: AC

## 2022-09-23 NOTE — ED Notes (Signed)
Bladder scan 167

## 2022-09-23 NOTE — ED Provider Notes (Signed)
[place] [facility] Provider Note   CSN: [id number] Arrival date & time: 09/23/22  1738     History  Chief Complaint  Patient presents with   Hematuria    Brenda Alexander is a 89 y.o. female.  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  Patient lives at [facility].  She has been more agitated than usual so they thought she may have a urinary tract infection.  They tried getting a UA this morning and noted red blood from the urethra.  There was no vaginal bleeding or rectal bleeding.  She has had no fevers or vomiting.  They thought she may have a urinary tract infection so they just started her on antibiotics today but she is unsure if she got first dose.  History was obtained from daughter as patient's history is severely limited due to severe dementia.  She is not on any blood thinners.   Hematuria       Home Medications Prior to Admission medications   Medication Sig Start Date End Date Taking? Authorizing Provider  acetaminophen (TYLENOL) 500 MG tablet Take 1,000 mg by mouth every 6 (six) hours as needed (for pain).     [provider]  aspirin 81 MG chewable tablet Chew 1 tablet (81 mg total) by mouth daily. 05/09/22   Alma Friendly, MD  busPIRone (BUSPAR) 10 MG tablet Take 10 mg by mouth 3 (three) times daily.    [provider]  Calcium Carb-Cholecalciferol (CALCIUM + D3 PO) Take 1 tablet by mouth daily.    [provider]  cefdinir (OMNICEF) 300 MG capsule Take 1 capsule (300 mg total) by mouth 2 (two) times daily for 7 days. 09/24/22 10/01/22  Elgie Congo, MD  citalopram (CELEXA) 10 MG tablet Take 10 mg by mouth daily.    [provider]  cloNIDine (CATAPRES) 0.2 MG tablet Take 0.2 mg by mouth 2 (two) times daily. 08/06/21   [provider]  clopidogrel (PLAVIX)  75 MG tablet Take 1 tablet (75 mg total) by mouth daily. 05/09/22   Alma Friendly, MD  docusate sodium (COLACE) 100 MG capsule Take 1 capsule (100 mg total) by mouth 2 (two) times daily as needed for mild constipation or moderate constipation. 05/06/18   Anyanwu, Sallyanne Havers, MD  ferrous sulfate 325 (65 FE) MG EC tablet Take 1 tablet (325 mg total) by mouth 2 (two) times daily. 01/13/18 07/28/48  Elgergawy, Silver Huguenin, MD  isosorbide mononitrate (ISMO,MONOKET) 20 MG tablet Take 20 mg by mouth daily. 12/23/17   [provider]  labetalol (NORMODYNE) 100 MG tablet Take 0.5 tablets (50 mg total) by mouth 2 (two) times daily. 11/01/21   Antonieta Pert, MD  levETIRAcetam (KEPPRA) 250 MG tablet Take 250 mg by mouth 2 (two) times daily.    [provider]  LORazepam (ATIVAN) 0.5 MG tablet Take 1 tablet (0.5 mg total) by mouth daily as needed for anxiety. 05/08/22   Alma Friendly, MD  Multiple Vitamin (MULTIVITAMIN) tablet Take 1 tablet by mouth daily.    [provider]  Nutritional Supplements (ENSURE ORIGINAL) LIQD Take 1 Bottle by mouth at bedtime.    [provider]  ondansetron (ZOFRAN-ODT) 4 MG disintegrating tablet '4mg'$  ODT q4 hours prn nausea/vomit 07/13/22   Deno Etienne, DO  oseltamivir (TAMIFLU) 75 MG capsule Take 1 capsule (75 mg total) by  mouth every 12 (twelve) hours. 07/13/22   Deno Etienne, DO  pantoprazole (PROTONIX) 40 MG tablet Take 1 tablet (40 mg total) by mouth daily. 05/09/22   Alma Friendly, MD  polyethylene glycol (MIRALAX) 17 g packet Take 17 g by mouth daily. 09/23/22   Elgie Congo, MD  senna-docusate (SENOKOT-S) 8.6-50 MG tablet Take 1 tablet by mouth daily. 09/23/22   Elgie Congo, MD  simvastatin (ZOCOR) 20 MG tablet Take 2 tablets (40 mg total) by mouth at bedtime. 05/08/22   Alma Friendly, MD  sodium phosphate (FLEET) 7-19 GM/118ML ENEM Place 133 mLs (1 enema total) rectally once for 1 dose. 09/23/22 09/23/22  Elgie Congo, MD  spironolactone (ALDACTONE) 25 MG tablet Take 0.5 tablets (12.5 mg total) by mouth daily. 10/16/21   Patwardhan, Reynold Bowen, MD  vitamin C (ASCORBIC ACID) 500 MG tablet Take 500 mg by mouth daily.    [provider]  furosemide (LASIX) 40 MG tablet Take 40 mg by mouth as needed.  08/06/20  [provider]      Allergies    Other and Penicillins    Review of Systems   Review of Systems  Genitourinary:  Positive for hematuria.    Physical Exam Updated Vital Signs BP (!) 150/84 (BP Location: Right Arm)   Pulse 62   Temp 98.1 F (36.7 C) (Oral)   Resp 18   Ht '5\' 3"'$  (1.6 m)   Wt 71.2 kg   LMP  (LMP Unknown)   SpO2 100%   BMI 27.81 kg/m  Physical Exam Constitutional: Awake, agitated, nontoxic Eyes: Conjunctivae are normal. ENT      Head: Normocephalic and atraumatic. Cardiovascular: S1, S2, regular rate Respiratory: Normal respiratory effort. Gastrointestinal: Soft and mild distention, no rebound or guarding, GU exam with no active bleeding within diaper or from the urethra or vagina.  No rectal bleeding. Musculoskeletal: No pitting edema of lower extremities Neurologic: Agitated, awake, intermittently moving all extremities Skin: Skin is warm, dry and intact. No rash noted. Psychiatric: Mood and affect are normal. Speech and behavior are normal.  ED Results / Procedures / Treatments   Labs (all labs ordered are listed, but only abnormal results are displayed) Labs Reviewed  URINALYSIS, ROUTINE W REFLEX MICROSCOPIC - Abnormal; Notable for the following components:      Result Value   APPearance TURBID (*)    pH 8.5 (*)    Hgb urine dipstick LARGE (*)    Protein, ur >300 (*)    Leukocytes,Ua LARGE (*)    All other components within normal limits  BASIC METABOLIC PANEL - Abnormal; Notable for the following components:   BUN 26 (*)    All other components within normal limits  CBC WITH DIFFERENTIAL/PLATELET - Abnormal; Notable for the  following components:   WBC 3.5 (*)    RBC 3.58 (*)    Hemoglobin 10.2 (*)    HCT 32.0 (*)    All other components within normal limits  URINALYSIS, MICROSCOPIC (REFLEX) - Abnormal; Notable for the following components:   Bacteria, UA RARE (*)    Non Squamous Epithelial PRESENT (*)    All other components within normal limits  PROTIME-INR  CK    EKG None  Radiology CT Renal Stone Study  Result Date: 09/23/2022 CLINICAL DATA:  Bladder mass, hematuria EXAM: CT ABDOMEN AND PELVIS WITHOUT CONTRAST TECHNIQUE: Multidetector CT imaging of the abdomen and pelvis was performed following the standard protocol without IV contrast. RADIATION DOSE  REDUCTION: This exam was performed according to the departmental dose-optimization program which includes automated exposure control, adjustment of the mA and/or kV according to patient size and/or use of iterative reconstruction technique. COMPARISON:  10/30/2021 FINDINGS: Lower chest: No acute abnormality. Extensive multi-vessel coronary artery calcification Hepatobiliary: Cholelithiasis without pericholecystic inflammatory change. Liver unremarkable on this noncontrast examination. No intra or extrahepatic biliary ductal dilation. Pancreas: Unremarkable Spleen: Unremarkable Adrenals/Urinary Tract: The adrenal glands are unremarkable. Multiple simple cortical cysts are seen within the kidneys bilaterally for which no follow-up imaging is recommended. The kidneys are otherwise unremarkable. There is circumferential bladder wall thickening and mild perivesicular inflammatory stranding suggesting changes of an underlying diffuse infectious or inflammatory cystitis. The bladder is not distended. Stomach/Bowel: Large volume stool within the rectal vault with superimposed rectal wall thickening and mild perirectal inflammatory stranding suggesting changes of fecal impaction and/or stercoral proctitis. Superimposed large volume stool throughout the colon. The stomach,  small bowel, and large bowel are otherwise unremarkable. The appendix is not clearly identified, however, no secondary signs of appendicitis are seen within the right lower quadrant. No free intraperitoneal gas or fluid. Vascular/Lymphatic: Extensive aortoiliac atherosclerotic calcification. No aortic aneurysm. No pathologic adenopathy within the abdomen and pelvis. Reproductive: Status post hysterectomy. No adnexal masses. Other: No abdominal wall hernia. Mild diffuse subcutaneous body wall edema. Decubitus wound superficial to the mid to distal sacrum extends into the subcutaneous tissues but does not exposed the subjacent osseous structures. No subjacent osseous erosion. Musculoskeletal: The osseous structures are age-appropriate. No acute bone abnormality. No lytic or blastic bone lesion. IMPRESSION: 1. Circumferential bladder wall thickening and mild perivesicular inflammatory stranding suggesting changes of an underlying diffuse infectious or inflammatory cystitis. Correlation with urinalysis and urine culture may be helpful. 2. Large volume stool within the rectal vault with superimposed rectal wall thickening and mild perirectal inflammatory stranding suggesting changes of fecal impaction and/or stercoral proctitis. Superimposed large volume stool throughout the colon. 3. Extensive multi-vessel coronary artery calcification. 4. Cholelithiasis. 5. Decubitus wound superficial to the mid to distal sacrum extends into the subcutaneous tissues but does not exposed the subjacent osseous structures. No subjacent osseous erosion. Aortic Atherosclerosis (ICD10-I70.0). Electronically Signed   By: Fidela Salisbury M.D.   On: 09/23/2022 21:27    Procedures Procedures    Medications Ordered in ED Medications  polyethylene glycol (MIRALAX / GLYCOLAX) packet 17 g (17 g Oral Given 09/23/22 2146)  busPIRone (BUSPAR) tablet 10 mg (10 mg Oral Given 09/23/22 1942)  risperiDONE (RISPERDAL) tablet 1 mg (1 mg Oral Given  09/23/22 1942)  cefTRIAXone (ROCEPHIN) 1 g in sodium chloride 0.9 % 100 mL IVPB (1 g Intravenous New Bag/Given 09/23/22 2140)  senna (SENOKOT) tablet 8.6 mg (8.6 mg Oral Given 09/23/22 2146)  docusate sodium (COLACE) capsule 200 mg (200 mg Oral Given 09/23/22 2146)  lactulose (CHRONULAC) 10 GM/15ML solution 10 g (10 g Oral Given 09/23/22 2146)    ED Course/ Medical Decision Making/ A&P Clinical Course as of 09/23/22 2217  Tue Sep 23, 2022  2037 Labs reviewed by me generally unremarkable.  She has a stable chronic anemia hemoglobin 10.2.  Normal platelet count and INR no concern for coagulopathy leading to hematuria.  Creatinine 0.74 within normal limits.  White blood cell count 3.5. [VB]    Clinical Course User Index [VB] Elgie Congo, MD   {  Medical Decision Making Elain Koeller is a 88 y.o. female.  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    Labs reviewed by me generally unremarkable.  She has a stable chronic anemia hemoglobin 10.2.  No need for transfusion.  Normal platelet count and INR no concern for coagulopathy leading to hematuria.  Creatinine 0.74 within normal limits.  White blood cell count 3.5  Patient's hematuria likely secondary to cystitis and infection consistent with UA and CT scan today.  UA with large leukocyte Estrace greater than 50 RBCs and WBCs with rare bacteria.  She is not retaining urine, no Foley placed.  Given IV Rocephin in ED.  CT reviewed by me no evidence of renal stone or bladder mass.  Read by radiology as changes consistent with infectious or inflammatory cystitis as well as significant constipation.  She clinically does not appear to be septic or ill or have symptoms suggestive of stercoral colitis.  We have started giving patient p.o. stool softeners and medications with senna, Colace, MiraLAX and  lactulose in ED.  Discussed with daughter importance of nursing facility starting patient on bowel regimen which I have discharged patient with as well as performing enema tomorrow.  She will speak to her nursing facility and follow-up with this.  I have also discharged with 7-day course of cefdinir.  She is hemodynamically stable and safe for discharge at this time.  I did discuss return precautions.  Patient's daughter in agreement with this plan and feels safe with her discharging back to nursing facility at this time to avoid delirium or worsening in ED.  Amount and/or Complexity of Data Reviewed Labs: ordered. Radiology: ordered.  Risk OTC drugs. Prescription drug management.    Final Clinical Impression(s) / ED Diagnoses Final diagnoses:  Constipation, unspecified constipation type  Urinary tract infection with hematuria, site unspecified    Rx / DC Orders ED Discharge Orders          Ordered    cefdinir (OMNICEF) 300 MG capsule  2 times daily,   Status:  Discontinued        09/23/22 2207    cefdinir (OMNICEF) 300 MG capsule  2 times daily,   Status:  Discontinued        09/23/22 2208    cefdinir (OMNICEF) 300 MG capsule  2 times daily        09/23/22 2212    sodium phosphate (FLEET) 7-19 GM/118ML ENEM   Once,   Status:  Discontinued        09/23/22 2140    senna-docusate (SENOKOT-S) 8.6-50 MG tablet  Daily,   Status:  Discontinued        09/23/22 2140    polyethylene glycol (MIRALAX) 17 g packet  Daily,   Status:  Discontinued        09/23/22 2140    sodium phosphate (FLEET) 7-19 GM/118ML ENEM   Once        09/23/22 2208    senna-docusate (SENOKOT-S) 8.6-50 MG tablet  Daily        09/23/22 2208    polyethylene glycol (MIRALAX) 17 g packet  Daily,   Status:  Discontinued        09/23/22 2208    polyethylene glycol (MIRALAX) 17 g packet  Daily        09/23/22 2212              Georgina Snell C,  MD 09/23/22 2217

## 2022-09-23 NOTE — Discharge Instructions (Addendum)
You were seen in the emergency department today for blood in the urine.  This is likely due to an infection based on your urine collected today and CT scan.  There was no evidence of kidney stone or bladder mass.  Take the antibiotic cefdinir as prescribed and do not miss any doses.  You got your first dose of IV antibiotics in the ER.  The blood in the urine should resolve with infection.  However if you are still having issues, call the urology specialist in your discharge paperwork.  Your CT scan also showed significant constipation.  We recommend that you use the medications we have prescribed you to help with constipation.  You also need to drink some more water and we recommend an enema tomorrow.  1)  Colace (or Dulcolax) 100 mg:  This is a stool softener, and you may take it once or twice a day as needed. 2)  Senna tablets:  This is a bowel stimulant that will help "push" out your stool. It is the next step to add after you have tried a stool softener. 3)  Miralax (powder):  This medication works by drawing additional fluid into your intestines and helps to flush out your stool.  Mix the powder with water or juice according to label instructions.  It may help if the Colace and Senna are not sufficient, but you must be sure to use the recommended amount of water or juice when you mix up the powder.  Remember that narcotic pain medications are constipating, so avoid them or minimize their use.  Drink plenty of fluids.  Please return to the Emergency Department immediately if you develop new or worsening symptoms that concern you, such as (but not limited to) fever > 101 degrees, severe abdominal pain, persistent vomiting (especially vomiting with blood in it) or large amounts of blood in your stool. Please also make an appointment to follow up with your primary care doctor within one week to assure improvement or resolution in symptoms.

## 2022-09-23 NOTE — ED Triage Notes (Signed)
Pt was BIB [facility] from [facility]. EMS reported pt had  a urinalysis at the facility that showed pt had blood in her urine. She also c/o having abdominal pain. EMS reported the urine was bright red. Pt is A & O to self. Daughter is patient POA.

## 2022-09-23 NOTE — ED Notes (Signed)
Ptar called 

## 2022-09-24 ENCOUNTER — Other Ambulatory Visit (HOSPITAL_COMMUNITY): Payer: Self-pay

## 2023-06-28 DEATH — deceased

## 2024-03-02 IMAGING — CR DG KNEE COMPLETE 4+V*R*
4 series · 4 of 4 positions shown · non-contrast
Comparison: None.

CLINICAL DATA: Fall yesterday.  Right knee pain.

EXAM:
RIGHT KNEE - COMPLETE 4+ VIEW

[t knee ap right]
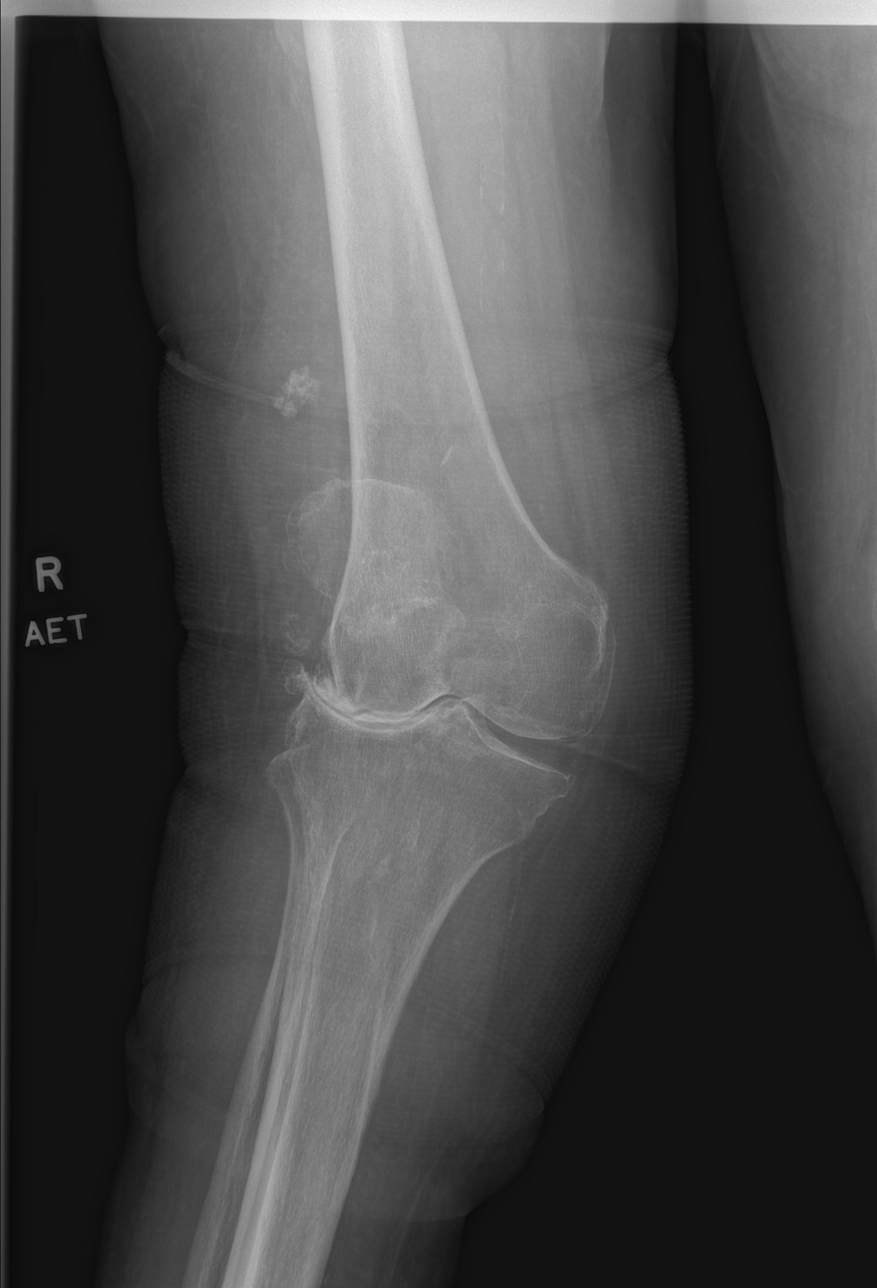

[t knee obl right (1 of 2)]
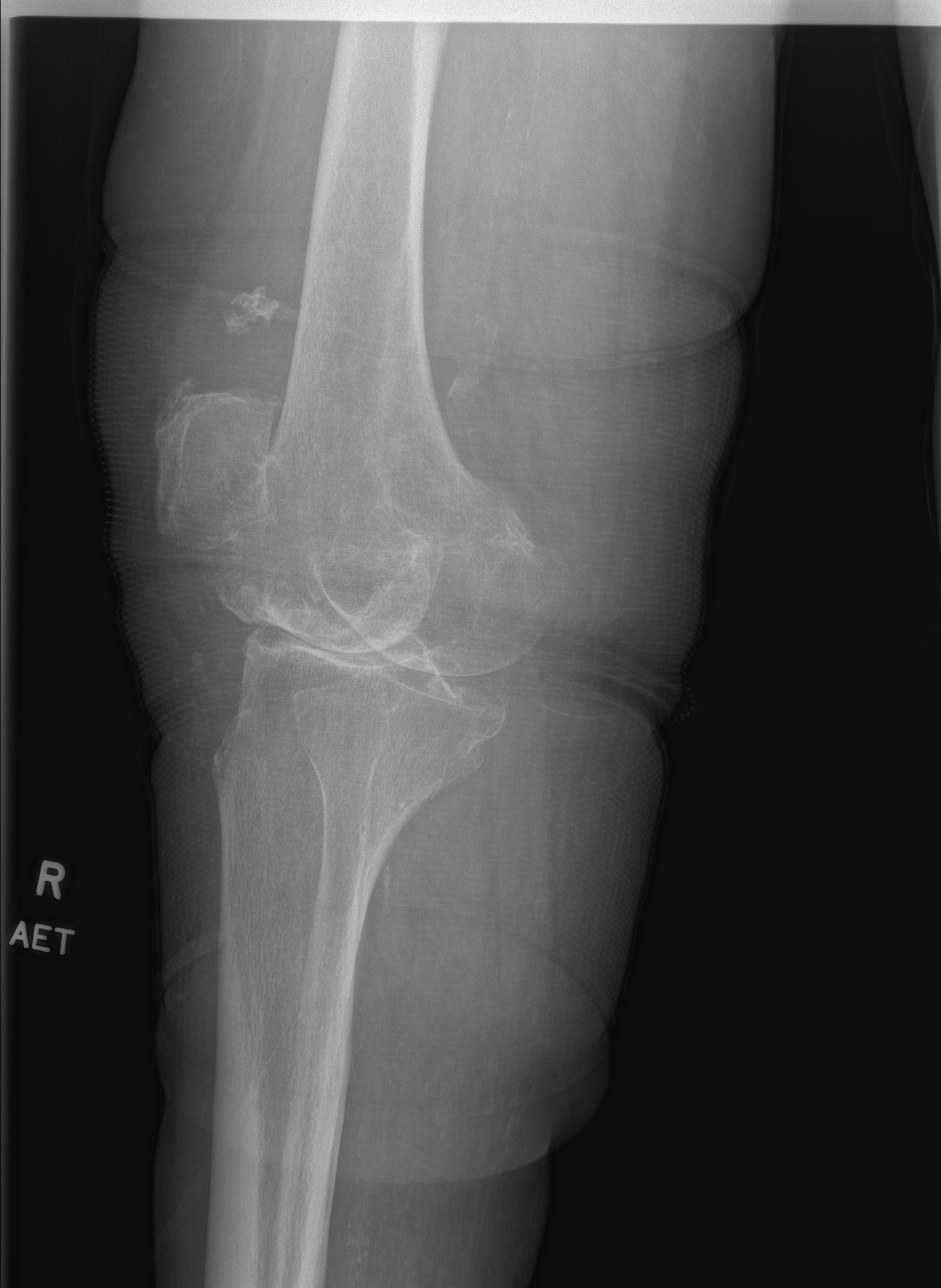

[t knee obl right (2 of 2)]
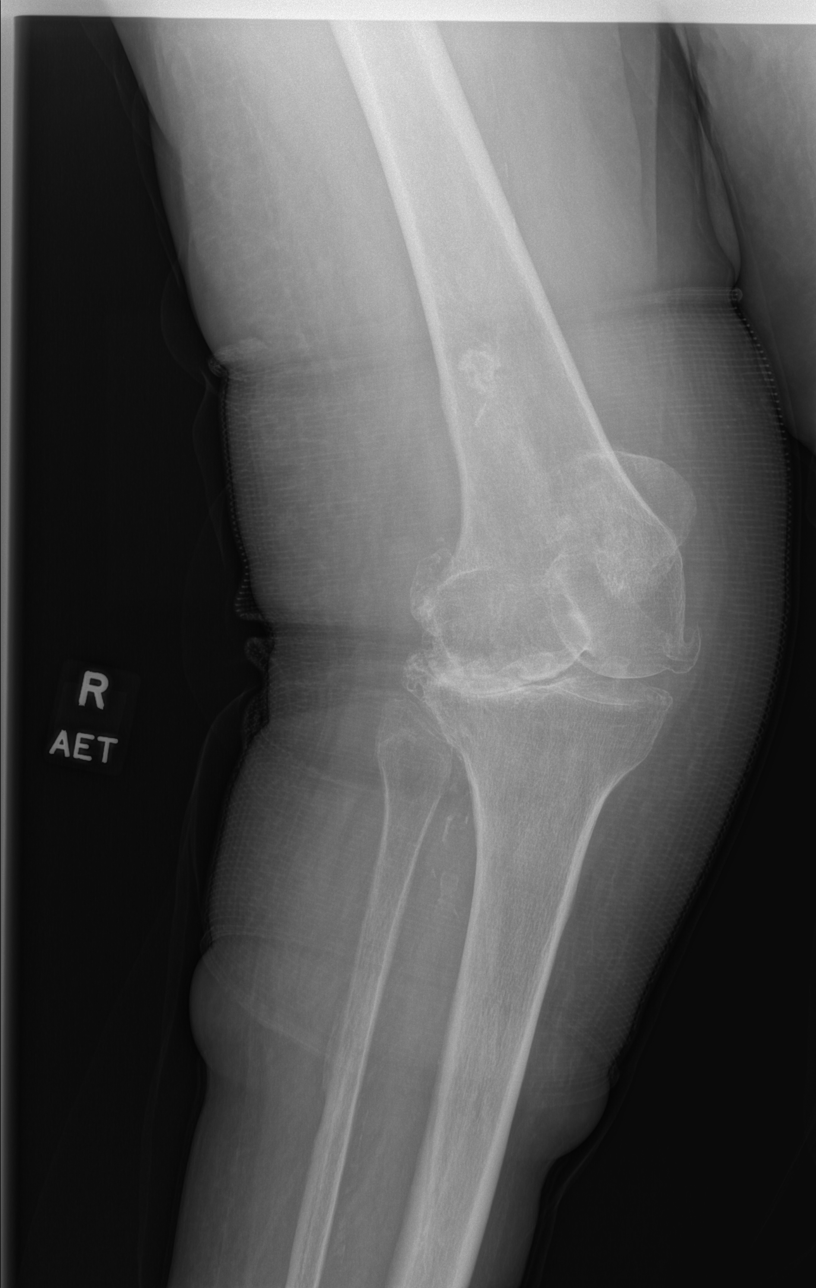

[x knee lat right]
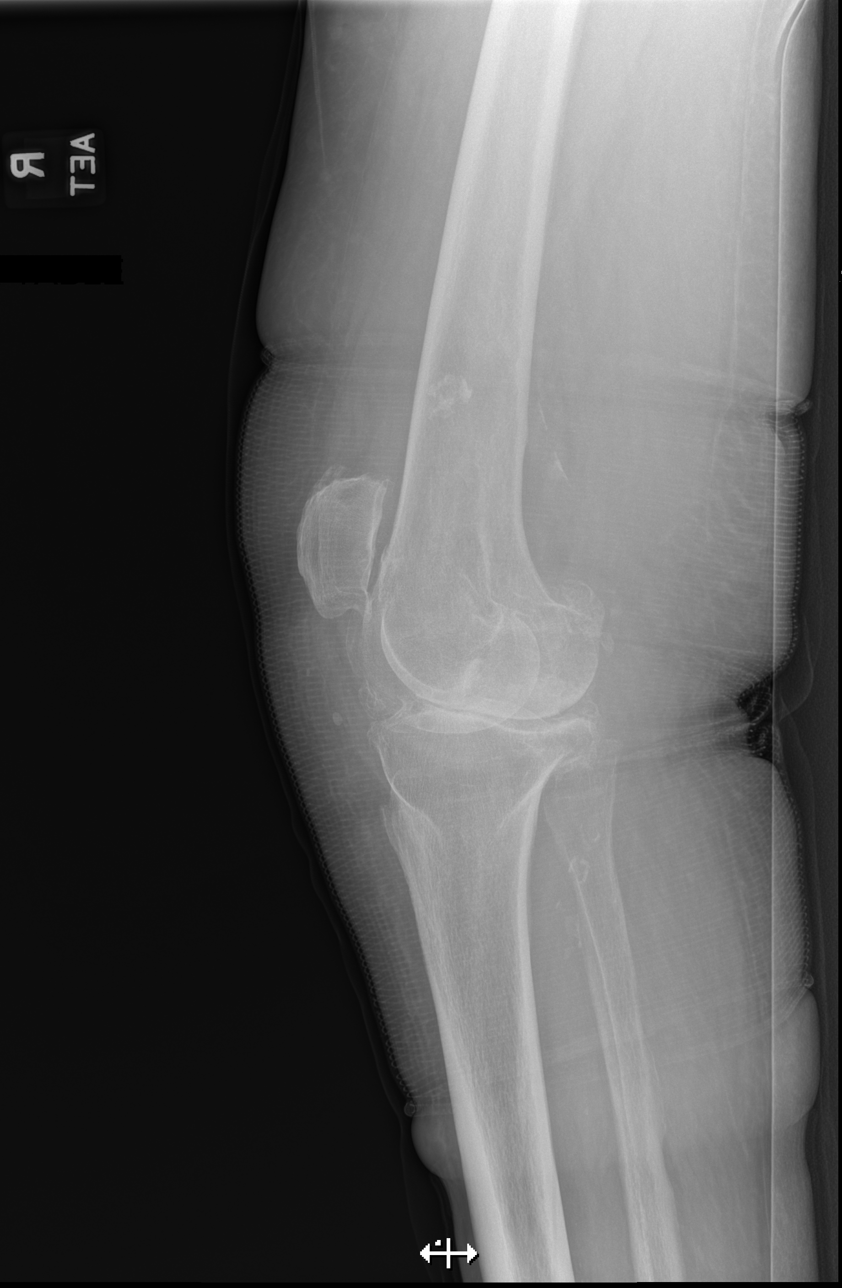

[4 of 4 positions shown; findings below may reference images not displayed]

FINDINGS: No evidence of fracture, dislocation, or joint effusion.
Tricompartmental osteoarthritis is seen with severe involvement of
the lateral compartment, and secondary tibia valgus. Patella alta
noted, consistent with prior infrapatellar tendon injury.
Generalized osteopenia is noted. Peripheral vascular calcification
is seen.
IMPRESSION: No acute findings.

Patella alta, consistent with prior infrapatellar tendon injury.

Tricompartmental osteoarthritis, with severe lateral compartment
involvement and tibia valgus.

Osteopenia.

## 2024-03-02 IMAGING — CR DG FEMUR 1V*R*
2 series · 2 of 2 positions shown · non-contrast
Comparison: None.

CLINICAL DATA: Fall.  Knee pain.

EXAM:
RIGHT FEMUR 1 VIEW

[t femur distal ap right]
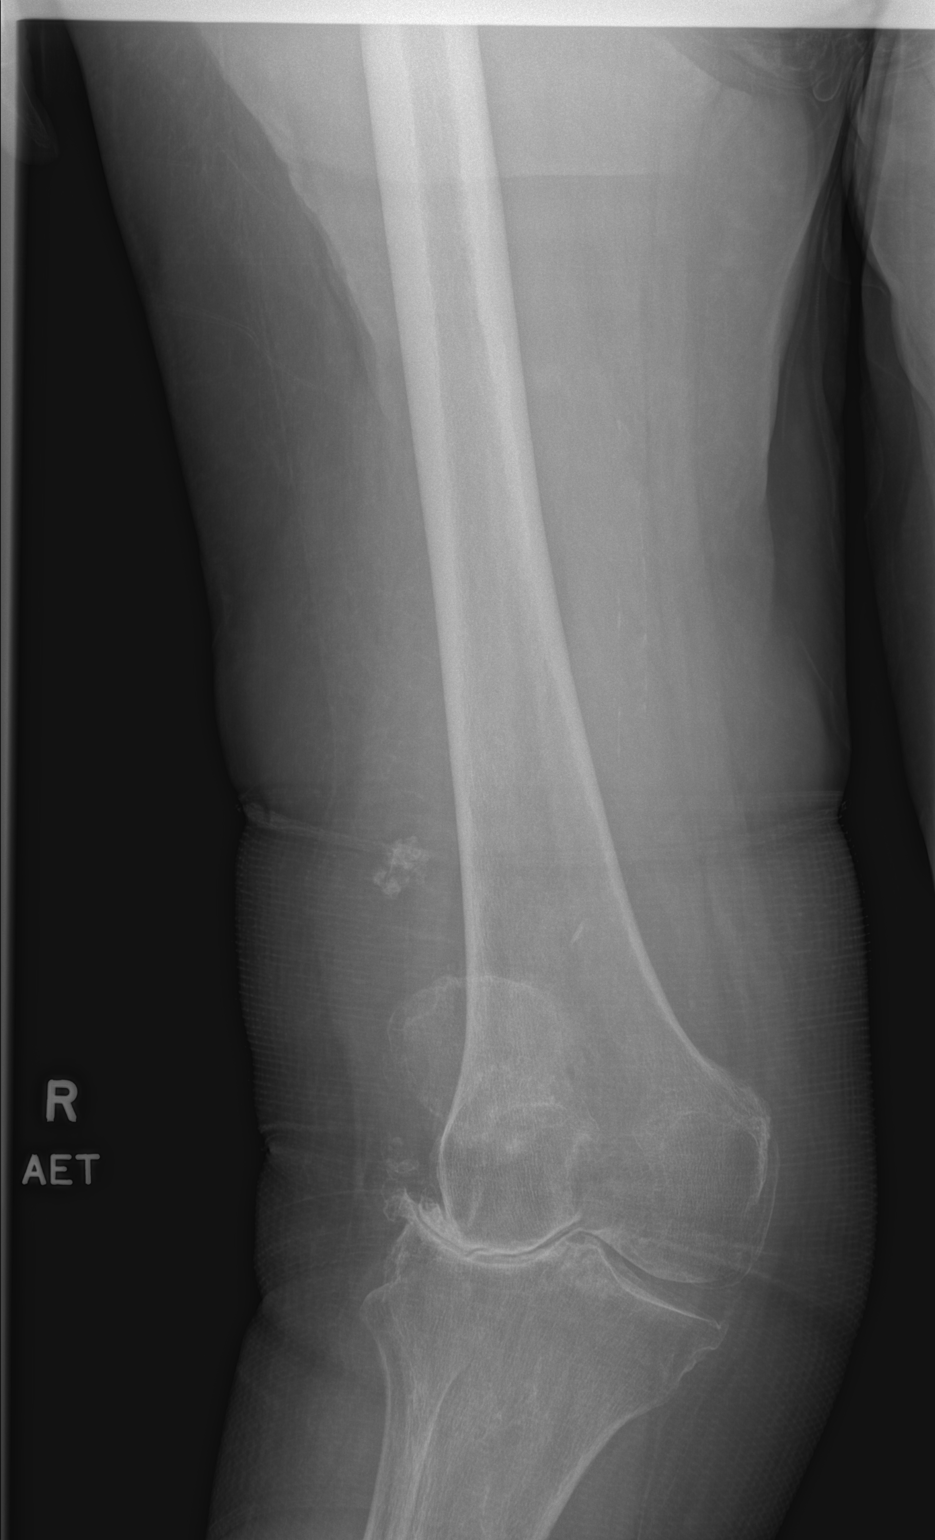

[t femur proximal ap right]
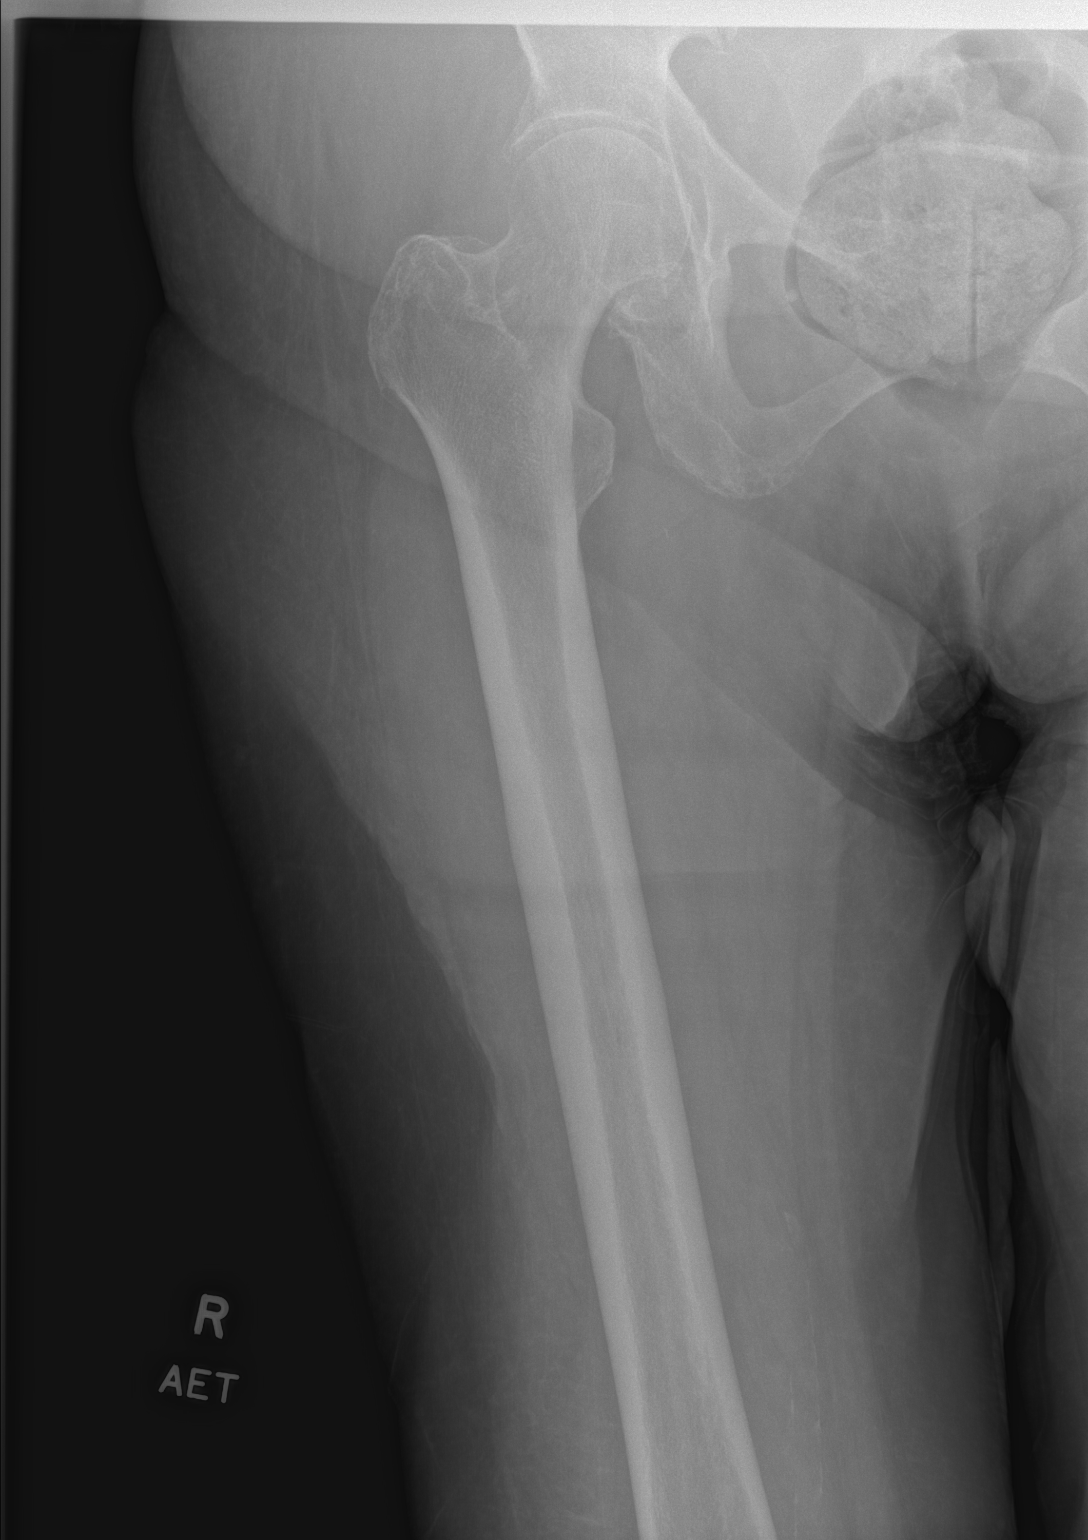

[2 of 2 positions shown; findings below may reference images not displayed]

FINDINGS: The frontal projection of femur demonstrates no femur fracture.
Severe knee osteoarthritis with markedly severe medial compartmental
articular space narrowing and marginal spurring. Irregular
calcification proximal to the patella is probably in the vastus
lateralis musculature, but could conceivably be in the suprapatellar
bursa.

Atherosclerosis.
IMPRESSION: 1. Severe osteoarthritis of the knee.
2. Irregular popcorn type calcification projecting above the patella
could be in the vastus lateralis muscle region or suprapatellar
bursa.
3. Atherosclerosis.
